# Patient Record
Sex: Female | Born: 1940
Health system: Southern US, Community
[De-identification: ages and names within clinical notes are randomized; demographics above are authoritative.]

## PROBLEM LIST (undated history)

## (undated) DIAGNOSIS — M109 Gout, unspecified: Secondary | ICD-10-CM

## (undated) DIAGNOSIS — J310 Chronic rhinitis: Secondary | ICD-10-CM

## (undated) DIAGNOSIS — F410 Panic disorder [episodic paroxysmal anxiety] without agoraphobia: Secondary | ICD-10-CM

## (undated) DIAGNOSIS — I495 Sick sinus syndrome: Secondary | ICD-10-CM

## (undated) DIAGNOSIS — K219 Gastro-esophageal reflux disease without esophagitis: Secondary | ICD-10-CM

## (undated) DIAGNOSIS — J40 Bronchitis, not specified as acute or chronic: Secondary | ICD-10-CM

## (undated) DIAGNOSIS — E039 Hypothyroidism, unspecified: Secondary | ICD-10-CM

## (undated) DIAGNOSIS — F40240 Claustrophobia: Secondary | ICD-10-CM

## (undated) DIAGNOSIS — I480 Paroxysmal atrial fibrillation: Secondary | ICD-10-CM

## (undated) DIAGNOSIS — E669 Obesity, unspecified: Secondary | ICD-10-CM

## (undated) DIAGNOSIS — F4310 Post-traumatic stress disorder, unspecified: Secondary | ICD-10-CM

## (undated) DIAGNOSIS — E785 Hyperlipidemia, unspecified: Secondary | ICD-10-CM

## (undated) DIAGNOSIS — I10 Essential (primary) hypertension: Secondary | ICD-10-CM

## (undated) DIAGNOSIS — F419 Anxiety disorder, unspecified: Secondary | ICD-10-CM

## (undated) DIAGNOSIS — I351 Nonrheumatic aortic (valve) insufficiency: Secondary | ICD-10-CM

## (undated) DIAGNOSIS — M7070 Other bursitis of hip, unspecified hip: Secondary | ICD-10-CM

## (undated) DIAGNOSIS — J45909 Unspecified asthma, uncomplicated: Secondary | ICD-10-CM

## (undated) DIAGNOSIS — M1990 Unspecified osteoarthritis, unspecified site: Secondary | ICD-10-CM

## (undated) DIAGNOSIS — I340 Nonrheumatic mitral (valve) insufficiency: Secondary | ICD-10-CM

## (undated) DIAGNOSIS — G4730 Sleep apnea, unspecified: Secondary | ICD-10-CM

## (undated) DIAGNOSIS — I499 Cardiac arrhythmia, unspecified: Secondary | ICD-10-CM

## (undated) HISTORY — DX: Bronchitis, not specified as acute or chronic: J40

## (undated) HISTORY — DX: Unspecified asthma, uncomplicated: J45.909

## (undated) HISTORY — PX: OTHER SURGICAL HISTORY: SHX169

## (undated) HISTORY — DX: Obesity, unspecified: E66.9

## (undated) HISTORY — PX: TONSILLECTOMY: SUR1361

## (undated) HISTORY — DX: Essential (primary) hypertension: I10

## (undated) HISTORY — PX: PACEMAKER INSERTION: SHX728

## (undated) HISTORY — DX: Chronic rhinitis: J31.0

## (undated) HISTORY — PX: ROTATOR CUFF REPAIR: SHX139

## (undated) HISTORY — DX: Unspecified osteoarthritis, unspecified site: M19.90

## (undated) HISTORY — DX: Nonrheumatic mitral (valve) insufficiency: I34.0

## (undated) HISTORY — DX: Paroxysmal atrial fibrillation: I48.0

## (undated) HISTORY — DX: Hypothyroidism, unspecified: E03.9

## (undated) HISTORY — DX: Hyperlipidemia, unspecified: E78.5

## (undated) HISTORY — DX: Sick sinus syndrome: I49.5

## (undated) HISTORY — PX: CARDIAC CATHETERIZATION: SHX172

## (undated) HISTORY — DX: Nonrheumatic aortic (valve) insufficiency: I35.1

## (undated) HISTORY — PX: INSERT / REPLACE / REMOVE PACEMAKER: SUR710

---

## 2001-10-29 ENCOUNTER — Encounter: Payer: Self-pay | Admitting: Internal Medicine

## 2001-10-29 ENCOUNTER — Encounter: Admission: RE | Admit: 2001-10-29 | Discharge: 2001-10-29 | Payer: Self-pay | Admitting: Internal Medicine

## 2001-11-01 ENCOUNTER — Encounter: Admission: RE | Admit: 2001-11-01 | Discharge: 2001-11-01 | Payer: Self-pay | Admitting: Internal Medicine

## 2001-11-01 ENCOUNTER — Encounter: Payer: Self-pay | Admitting: Internal Medicine

## 2002-06-10 ENCOUNTER — Encounter: Admission: RE | Admit: 2002-06-10 | Discharge: 2002-06-10 | Payer: Self-pay | Admitting: Internal Medicine

## 2002-06-10 ENCOUNTER — Encounter: Payer: Self-pay | Admitting: Internal Medicine

## 2002-08-19 ENCOUNTER — Encounter: Payer: Self-pay | Admitting: *Deleted

## 2002-08-19 ENCOUNTER — Inpatient Hospital Stay (HOSPITAL_COMMUNITY): Admission: AD | Admit: 2002-08-19 | Discharge: 2002-08-23 | Payer: Self-pay | Admitting: *Deleted

## 2002-08-20 ENCOUNTER — Encounter: Payer: Self-pay | Admitting: Cardiovascular Disease

## 2002-12-12 ENCOUNTER — Encounter: Admission: RE | Admit: 2002-12-12 | Discharge: 2002-12-12 | Payer: Self-pay | Admitting: Internal Medicine

## 2002-12-12 ENCOUNTER — Encounter: Payer: Self-pay | Admitting: Internal Medicine

## 2003-04-01 ENCOUNTER — Encounter: Admission: RE | Admit: 2003-04-01 | Discharge: 2003-04-01 | Payer: Self-pay | Admitting: Internal Medicine

## 2003-12-17 ENCOUNTER — Encounter: Admission: RE | Admit: 2003-12-17 | Discharge: 2003-12-17 | Payer: Self-pay

## 2004-03-29 ENCOUNTER — Ambulatory Visit (HOSPITAL_COMMUNITY): Admission: RE | Admit: 2004-03-29 | Discharge: 2004-03-29 | Payer: Self-pay | Admitting: Orthopedic Surgery

## 2004-03-30 ENCOUNTER — Ambulatory Visit: Payer: Self-pay

## 2004-04-29 ENCOUNTER — Observation Stay (HOSPITAL_COMMUNITY): Admission: RE | Admit: 2004-04-29 | Discharge: 2004-04-30 | Payer: Self-pay | Admitting: Orthopedic Surgery

## 2004-05-31 ENCOUNTER — Ambulatory Visit: Payer: Self-pay | Admitting: *Deleted

## 2004-06-08 ENCOUNTER — Ambulatory Visit: Payer: Self-pay

## 2004-06-28 ENCOUNTER — Ambulatory Visit: Payer: Self-pay | Admitting: *Deleted

## 2005-09-20 ENCOUNTER — Ambulatory Visit (HOSPITAL_COMMUNITY): Admission: RE | Admit: 2005-09-20 | Discharge: 2005-09-20 | Payer: Self-pay

## 2005-12-19 ENCOUNTER — Encounter: Admission: RE | Admit: 2005-12-19 | Discharge: 2005-12-19 | Payer: Self-pay | Admitting: Internal Medicine

## 2007-04-03 ENCOUNTER — Ambulatory Visit: Payer: Self-pay | Admitting: Cardiology

## 2007-04-04 ENCOUNTER — Encounter: Payer: Self-pay | Admitting: Cardiology

## 2007-04-04 ENCOUNTER — Ambulatory Visit: Payer: Self-pay

## 2007-04-05 ENCOUNTER — Ambulatory Visit: Payer: Self-pay | Admitting: Cardiology

## 2007-04-08 ENCOUNTER — Ambulatory Visit: Payer: Self-pay | Admitting: Cardiology

## 2007-04-08 LAB — CONVERTED CEMR LAB
ALT: 21 units/L (ref 0–35)
Alkaline Phosphatase: 44 units/L (ref 39–117)
BUN: 32 mg/dL — ABNORMAL HIGH (ref 6–23)
Basophils Relative: 0.8 % (ref 0.0–1.0)
CO2: 25 meq/L (ref 19–32)
Calcium: 9 mg/dL (ref 8.4–10.5)
Creatinine, Ser: 1.1 mg/dL (ref 0.4–1.2)
HDL: 54.2 mg/dL (ref >39.0)
Hemoglobin: 13.7 g/dL (ref 12.0–15.0)
LDL Cholesterol: 71 mg/dL (ref 0–99)
Monocytes Relative: 8.3 % (ref 3.0–11.0)
Platelets: 173 10*3/uL (ref 150–400)
RBC: 4.35 M/uL (ref 3.87–5.11)
RDW: 13.4 % (ref 11.5–14.6)
Total Bilirubin: 0.7 mg/dL (ref 0.3–1.2)
Total Protein: 6.9 g/dL (ref 6.0–8.3)
Triglycerides: 74 mg/dL (ref 0–149)
VLDL: 15 mg/dL (ref 0–40)

## 2007-04-16 ENCOUNTER — Ambulatory Visit: Payer: Self-pay | Admitting: Cardiovascular Disease

## 2007-04-16 ENCOUNTER — Ambulatory Visit: Payer: Self-pay

## 2007-04-24 ENCOUNTER — Ambulatory Visit: Payer: Self-pay | Admitting: Internal Medicine

## 2007-05-01 ENCOUNTER — Ambulatory Visit: Payer: Self-pay | Admitting: Internal Medicine

## 2007-05-08 ENCOUNTER — Ambulatory Visit: Payer: Self-pay | Admitting: Cardiology

## 2007-05-13 ENCOUNTER — Ambulatory Visit: Payer: Self-pay | Admitting: Internal Medicine

## 2007-05-13 LAB — CONVERTED CEMR LAB
BUN: 37 mg/dL — ABNORMAL HIGH (ref 6–23)
Basophils Relative: 0.9 % (ref 0.0–1.0)
CO2: 25 meq/L (ref 19–32)
GFR calc Af Amer: 71 mL/min
HCT: 44.3 % (ref 36.0–46.0)
Hemoglobin: 14.5 g/dL (ref 12.0–15.0)
Lymphocytes Relative: 35.9 % (ref 12.0–46.0)
MCHC: 32.8 g/dL (ref 30.0–36.0)
Monocytes Absolute: 0.4 10*3/uL (ref 0.2–0.7)
Monocytes Relative: 7.2 % (ref 3.0–11.0)
Neutro Abs: 3 10*3/uL (ref 1.4–7.7)
Neutrophils Relative %: 53.5 % (ref 43.0–77.0)
Potassium: 3.5 meq/L (ref 3.5–5.1)
Prothrombin Time: 23.5 s — ABNORMAL HIGH (ref 10.9–13.3)
Sodium: 140 meq/L (ref 135–145)

## 2007-05-14 ENCOUNTER — Ambulatory Visit (HOSPITAL_COMMUNITY): Admission: AD | Admit: 2007-05-14 | Discharge: 2007-05-15 | Payer: Self-pay | Admitting: Internal Medicine

## 2007-05-14 ENCOUNTER — Ambulatory Visit: Payer: Self-pay | Admitting: Internal Medicine

## 2007-05-22 ENCOUNTER — Ambulatory Visit: Payer: Self-pay | Admitting: Cardiology

## 2007-05-27 ENCOUNTER — Ambulatory Visit: Payer: Self-pay

## 2007-05-31 ENCOUNTER — Ambulatory Visit: Payer: Self-pay | Admitting: Cardiology

## 2007-06-05 ENCOUNTER — Ambulatory Visit: Payer: Self-pay | Admitting: Internal Medicine

## 2007-06-26 ENCOUNTER — Ambulatory Visit: Payer: Self-pay | Admitting: Cardiovascular Disease

## 2007-07-24 ENCOUNTER — Ambulatory Visit: Payer: Self-pay | Admitting: Cardiology

## 2007-08-07 ENCOUNTER — Ambulatory Visit: Payer: Self-pay | Admitting: Cardiology

## 2007-08-21 ENCOUNTER — Ambulatory Visit: Payer: Self-pay | Admitting: Cardiovascular Disease

## 2007-09-09 ENCOUNTER — Ambulatory Visit: Payer: Self-pay | Admitting: Internal Medicine

## 2007-09-18 ENCOUNTER — Ambulatory Visit: Payer: Self-pay | Admitting: Internal Medicine

## 2007-09-25 ENCOUNTER — Ambulatory Visit: Payer: Self-pay | Admitting: Cardiology

## 2007-10-09 ENCOUNTER — Ambulatory Visit: Payer: Self-pay | Admitting: Cardiovascular Disease

## 2007-10-09 ENCOUNTER — Ambulatory Visit: Payer: Self-pay | Admitting: Cardiology

## 2007-10-09 LAB — CONVERTED CEMR LAB
ALT: 19 units/L (ref 0–35)
AST: 27 units/L (ref 0–37)
BUN: 30 mg/dL — ABNORMAL HIGH (ref 6–23)
Basophils Relative: 0.5 % (ref 0.0–3.0)
CO2: 26 meq/L (ref 19–32)
Calcium: 9.5 mg/dL (ref 8.4–10.5)
Chloride: 104 meq/L (ref 96–112)
Cholesterol: 165 mg/dL (ref 0–200)
Creatinine, Ser: 1.1 mg/dL (ref 0.4–1.2)
Eosinophils Relative: 3.6 % (ref 0.0–5.0)
Glucose, Bld: 93 mg/dL (ref 70–99)
Hemoglobin: 13.9 g/dL (ref 12.0–15.0)
LDL Cholesterol: 83 mg/dL (ref 0–99)
Lymphocytes Relative: 30 % (ref 12.0–46.0)
MCHC: 33.6 g/dL (ref 30.0–36.0)
Monocytes Relative: 7.6 % (ref 3.0–12.0)
Neutro Abs: 3.5 10*3/uL (ref 1.4–7.7)
Neutrophils Relative %: 58.3 % (ref 43.0–77.0)
RBC: 4.4 M/uL (ref 3.87–5.11)
Total Bilirubin: 0.8 mg/dL (ref 0.3–1.2)
Total CHOL/HDL Ratio: 3.6
Total Protein: 7.1 g/dL (ref 6.0–8.3)
VLDL: 36 mg/dL (ref 0–40)
WBC: 5.8 10*3/uL (ref 4.5–10.5)

## 2007-10-30 ENCOUNTER — Ambulatory Visit: Payer: Self-pay | Admitting: Cardiology

## 2007-11-13 ENCOUNTER — Ambulatory Visit: Payer: Self-pay | Admitting: Cardiovascular Disease

## 2007-11-27 ENCOUNTER — Ambulatory Visit: Payer: Self-pay | Admitting: Cardiovascular Disease

## 2007-12-25 ENCOUNTER — Ambulatory Visit: Payer: Self-pay | Admitting: Cardiovascular Disease

## 2008-01-29 ENCOUNTER — Ambulatory Visit: Payer: Self-pay | Admitting: Internal Medicine

## 2008-02-26 ENCOUNTER — Ambulatory Visit: Payer: Self-pay | Admitting: Internal Medicine

## 2008-03-18 ENCOUNTER — Ambulatory Visit: Payer: Self-pay | Admitting: Internal Medicine

## 2008-04-02 ENCOUNTER — Encounter: Admission: RE | Admit: 2008-04-02 | Discharge: 2008-04-02 | Payer: Self-pay | Admitting: Nurse Practitioner

## 2008-04-15 ENCOUNTER — Ambulatory Visit: Payer: Self-pay | Admitting: Cardiology

## 2008-04-30 ENCOUNTER — Encounter: Payer: Self-pay | Admitting: Internal Medicine

## 2008-05-06 ENCOUNTER — Ambulatory Visit: Payer: Self-pay | Admitting: Cardiovascular Disease

## 2008-05-06 ENCOUNTER — Ambulatory Visit: Payer: Self-pay | Admitting: Internal Medicine

## 2008-06-03 ENCOUNTER — Ambulatory Visit: Payer: Self-pay | Admitting: Internal Medicine

## 2008-06-04 DIAGNOSIS — E669 Obesity, unspecified: Secondary | ICD-10-CM

## 2008-06-04 DIAGNOSIS — E039 Hypothyroidism, unspecified: Secondary | ICD-10-CM | POA: Insufficient documentation

## 2008-06-04 DIAGNOSIS — I10 Essential (primary) hypertension: Secondary | ICD-10-CM | POA: Insufficient documentation

## 2008-06-04 DIAGNOSIS — I495 Sick sinus syndrome: Secondary | ICD-10-CM | POA: Insufficient documentation

## 2008-06-04 DIAGNOSIS — I4891 Unspecified atrial fibrillation: Secondary | ICD-10-CM | POA: Insufficient documentation

## 2008-06-04 DIAGNOSIS — E785 Hyperlipidemia, unspecified: Secondary | ICD-10-CM | POA: Insufficient documentation

## 2008-06-05 ENCOUNTER — Encounter (INDEPENDENT_AMBULATORY_CARE_PROVIDER_SITE_OTHER): Payer: Self-pay | Admitting: *Deleted

## 2008-06-05 ENCOUNTER — Encounter: Payer: Self-pay | Admitting: Cardiology

## 2008-06-05 ENCOUNTER — Ambulatory Visit: Payer: Self-pay | Admitting: Cardiology

## 2008-06-05 DIAGNOSIS — Z950 Presence of cardiac pacemaker: Secondary | ICD-10-CM

## 2008-06-22 ENCOUNTER — Ambulatory Visit: Payer: Self-pay | Admitting: Internal Medicine

## 2008-06-24 ENCOUNTER — Ambulatory Visit: Payer: Self-pay | Admitting: Cardiology

## 2008-07-02 ENCOUNTER — Ambulatory Visit: Payer: Self-pay | Admitting: Cardiology

## 2008-07-16 ENCOUNTER — Ambulatory Visit: Payer: Self-pay | Admitting: Internal Medicine

## 2008-07-28 ENCOUNTER — Encounter: Payer: Self-pay | Admitting: *Deleted

## 2008-08-13 ENCOUNTER — Ambulatory Visit: Payer: Self-pay | Admitting: Internal Medicine

## 2008-08-13 ENCOUNTER — Encounter (INDEPENDENT_AMBULATORY_CARE_PROVIDER_SITE_OTHER): Payer: Self-pay | Admitting: Pharmacist

## 2008-08-13 LAB — CONVERTED CEMR LAB
POC INR: 2.8
Protime: 20.4

## 2008-08-26 ENCOUNTER — Telehealth: Payer: Self-pay | Admitting: Cardiology

## 2008-09-02 ENCOUNTER — Encounter: Payer: Self-pay | Admitting: *Deleted

## 2008-09-16 ENCOUNTER — Ambulatory Visit: Payer: Self-pay | Admitting: Cardiology

## 2008-09-21 ENCOUNTER — Ambulatory Visit: Payer: Self-pay | Admitting: Internal Medicine

## 2008-09-24 ENCOUNTER — Ambulatory Visit (HOSPITAL_BASED_OUTPATIENT_CLINIC_OR_DEPARTMENT_OTHER): Admission: RE | Admit: 2008-09-24 | Discharge: 2008-09-24 | Payer: Self-pay | Admitting: Nurse Practitioner

## 2008-09-26 ENCOUNTER — Ambulatory Visit: Payer: Self-pay | Admitting: Internal Medicine

## 2008-09-30 ENCOUNTER — Ambulatory Visit: Payer: Self-pay | Admitting: Cardiology

## 2008-09-30 LAB — CONVERTED CEMR LAB
POC INR: 4
Prothrombin Time: 24.2 s

## 2008-10-14 ENCOUNTER — Ambulatory Visit: Payer: Self-pay | Admitting: Internal Medicine

## 2008-11-04 ENCOUNTER — Ambulatory Visit: Payer: Self-pay | Admitting: Cardiology

## 2008-11-04 LAB — CONVERTED CEMR LAB: POC INR: 2.8

## 2008-11-12 ENCOUNTER — Encounter (INDEPENDENT_AMBULATORY_CARE_PROVIDER_SITE_OTHER): Payer: Self-pay | Admitting: *Deleted

## 2008-11-25 ENCOUNTER — Ambulatory Visit: Payer: Self-pay | Admitting: Cardiovascular Disease

## 2008-11-25 ENCOUNTER — Ambulatory Visit: Payer: Self-pay

## 2008-11-25 ENCOUNTER — Encounter: Payer: Self-pay | Admitting: Internal Medicine

## 2008-11-25 LAB — CONVERTED CEMR LAB: POC INR: 2.1

## 2008-12-21 ENCOUNTER — Ambulatory Visit: Payer: Self-pay | Admitting: Internal Medicine

## 2009-01-04 ENCOUNTER — Ambulatory Visit: Payer: Self-pay | Admitting: Cardiology

## 2009-01-04 LAB — CONVERTED CEMR LAB: POC INR: 2.4

## 2009-02-01 ENCOUNTER — Ambulatory Visit: Payer: Self-pay | Admitting: Cardiology

## 2009-03-04 ENCOUNTER — Ambulatory Visit: Payer: Self-pay | Admitting: Cardiovascular Disease

## 2009-03-04 LAB — CONVERTED CEMR LAB: POC INR: 2.9

## 2009-03-22 ENCOUNTER — Ambulatory Visit: Payer: Self-pay | Admitting: Internal Medicine

## 2009-04-02 ENCOUNTER — Ambulatory Visit: Payer: Self-pay | Admitting: Internal Medicine

## 2009-04-02 LAB — CONVERTED CEMR LAB: POC INR: 1.9

## 2009-04-30 ENCOUNTER — Ambulatory Visit: Payer: Self-pay | Admitting: Cardiology

## 2009-05-17 ENCOUNTER — Encounter: Admission: RE | Admit: 2009-05-17 | Discharge: 2009-05-17 | Payer: Self-pay | Admitting: Nurse Practitioner

## 2009-05-22 ENCOUNTER — Emergency Department (HOSPITAL_COMMUNITY): Admission: EM | Admit: 2009-05-22 | Discharge: 2009-05-22 | Payer: Self-pay | Admitting: Emergency Medicine

## 2009-05-22 ENCOUNTER — Ambulatory Visit: Payer: Self-pay | Admitting: Cardiology

## 2009-05-22 ENCOUNTER — Encounter: Payer: Self-pay | Admitting: Internal Medicine

## 2009-05-25 ENCOUNTER — Encounter: Payer: Self-pay | Admitting: Cardiology

## 2009-05-26 ENCOUNTER — Ambulatory Visit: Payer: Self-pay | Admitting: Cardiovascular Disease

## 2009-05-31 ENCOUNTER — Ambulatory Visit: Payer: Self-pay | Admitting: Cardiovascular Disease

## 2009-05-31 LAB — CONVERTED CEMR LAB: POC INR: 1.6

## 2009-06-07 ENCOUNTER — Ambulatory Visit: Payer: Self-pay | Admitting: Cardiovascular Disease

## 2009-06-07 LAB — CONVERTED CEMR LAB: POC INR: 2.9

## 2009-06-14 ENCOUNTER — Ambulatory Visit: Payer: Self-pay | Admitting: Cardiology

## 2009-06-21 ENCOUNTER — Ambulatory Visit: Payer: Self-pay | Admitting: Internal Medicine

## 2009-06-25 ENCOUNTER — Ambulatory Visit: Payer: Self-pay | Admitting: Cardiology

## 2009-06-25 LAB — CONVERTED CEMR LAB: POC INR: 1.7

## 2009-07-12 ENCOUNTER — Ambulatory Visit: Payer: Self-pay | Admitting: Cardiovascular Disease

## 2009-07-12 LAB — CONVERTED CEMR LAB: POC INR: 3.1

## 2009-07-30 ENCOUNTER — Ambulatory Visit: Payer: Self-pay | Admitting: Cardiovascular Disease

## 2009-07-30 LAB — CONVERTED CEMR LAB: POC INR: 2

## 2009-08-23 ENCOUNTER — Ambulatory Visit: Payer: Self-pay | Admitting: Cardiology

## 2009-09-17 ENCOUNTER — Ambulatory Visit: Payer: Self-pay | Admitting: Cardiology

## 2009-09-20 ENCOUNTER — Ambulatory Visit: Payer: Self-pay | Admitting: Internal Medicine

## 2009-10-08 ENCOUNTER — Ambulatory Visit: Payer: Self-pay | Admitting: Internal Medicine

## 2009-10-29 ENCOUNTER — Ambulatory Visit: Payer: Self-pay | Admitting: Cardiology

## 2009-11-12 ENCOUNTER — Ambulatory Visit: Payer: Self-pay | Admitting: Cardiology

## 2009-11-26 ENCOUNTER — Encounter (INDEPENDENT_AMBULATORY_CARE_PROVIDER_SITE_OTHER): Payer: Self-pay | Admitting: *Deleted

## 2009-12-03 ENCOUNTER — Ambulatory Visit: Payer: Self-pay | Admitting: Cardiology

## 2009-12-03 LAB — CONVERTED CEMR LAB: POC INR: 2.6

## 2009-12-17 ENCOUNTER — Encounter: Admission: RE | Admit: 2009-12-17 | Discharge: 2009-12-17 | Payer: Self-pay | Admitting: Internal Medicine

## 2009-12-20 ENCOUNTER — Ambulatory Visit: Payer: Self-pay | Admitting: Internal Medicine

## 2009-12-27 ENCOUNTER — Encounter: Payer: Self-pay | Admitting: Internal Medicine

## 2009-12-27 ENCOUNTER — Ambulatory Visit: Payer: Self-pay

## 2009-12-31 ENCOUNTER — Ambulatory Visit: Payer: Self-pay | Admitting: Cardiology

## 2010-01-28 ENCOUNTER — Ambulatory Visit: Payer: Self-pay | Admitting: Cardiovascular Disease

## 2010-02-25 ENCOUNTER — Ambulatory Visit: Payer: Self-pay | Admitting: Cardiovascular Disease

## 2010-02-25 LAB — CONVERTED CEMR LAB: POC INR: 2.8

## 2010-03-20 ENCOUNTER — Encounter: Payer: Self-pay | Admitting: Internal Medicine

## 2010-03-21 ENCOUNTER — Encounter: Payer: Self-pay | Admitting: Internal Medicine

## 2010-03-25 ENCOUNTER — Ambulatory Visit: Admission: RE | Admit: 2010-03-25 | Discharge: 2010-03-25 | Payer: Self-pay | Source: Home / Self Care

## 2010-03-25 LAB — CONVERTED CEMR LAB: POC INR: 1.6

## 2010-03-29 ENCOUNTER — Ambulatory Visit: Payer: Self-pay | Admitting: Internal Medicine

## 2010-03-29 NOTE — Cardiovascular Report (Signed)
Summary: TTM   TTM   Imported By: Roderic Ovens 07/22/2009 11:59:40  _____________________________________________________________________  External Attachment:    Type:   Image     Comment:   External Document

## 2010-03-29 NOTE — Medication Information (Signed)
Summary: rov/tm  Anticoagulant Therapy  Managed by: Leota Sauers, PharmD, BCPS, CPP Referring MD: Olga Millers MD Supervising MD: Excell Seltzer MD, Casimiro Needle Indication 1: Atrial Fibrillation (ICD-427.31) Lab Used: [facility]  Site: [facility] INR POC 2.9 INR RANGE 2 - 3  Dietary changes: no    Health status changes: no    Bleeding/hemorrhagic complications: no    Recent/future hospitalizations: no    Any changes in medication regimen? no    Recent/future dental: no  Any missed doses?: no       Is patient compliant with meds? yes       Current Medications (verified): 1)  Diovan 320 Mg Tabs (Valsartan) .Marland Kitchen.. 1 Tab By Mouth Once Daily 2)  Zocor 20 Mg Tabs (Simvastatin) .Marland Kitchen.. 1 Tab By Mouth Once Daily 3)  Hydrochlorothiazide 12.5 Mg Tabs (Hydrochlorothiazide) .Marland Kitchen.. 1 Tab By Mouth Once Daily 4)  Fish Oil   Oil (Fish Oil) .Marland Kitchen.. 1 Tab By Mouth Once Daily 5)  Warfarin Sodium 5 Mg Tabs (Warfarin Sodium) .... Use As Directed By Anticoagulation Clinic 6)  Potassium Chloride Cr 10 Meq Cr-Caps (Potassium Chloride) .... Take One Tablet By Mouth Daily 7)  Metoprolol Tartrate 50 Mg Tabs (Metoprolol Tartrate) .... Take One and One Half Tablet By Mouth Twice A Day 8)  Prometrium 100 Mg Caps (Progesterone Micronized) .... Once Daily 9)  Vitamin D3 25000 Units .Marland Kitchen.. 1 Tab Bi Weekly  Allergies (verified): No Known Drug Allergies  Anticoagulation Management History:      The patient is taking warfarin and comes in today for a routine follow up visit.  Positive risk factors for bleeding include an age of 69 years or older.  The bleeding index is 'intermediate risk'.  Positive CHADS2 values include History of HTN.  Negative CHADS2 values include Age > 72 years old.  The start date was 04/01/2007.  Her last INR was 3.4 RATIO.  Anticoagulation responsible provider: Excell Seltzer MD, Casimiro Needle.  INR POC: 2.9.  Cuvette Lot#: [id number].  Exp: 03/2010.    Anticoagulation Management Assessment/Plan:      The patient's  current anticoagulation dose is Warfarin sodium 5 mg tabs: Use as directed by Anticoagulation Clinic.  The target INR is 2 - 3.  The next INR is due 04/01/2009.  Anticoagulation instructions were given to patient.  Results were reviewed/authorized by Leota Sauers, PharmD, BCPS, CPP.         Prior Anticoagulation Instructions: INR 3.1 Skip tomorrow's dose then resume 1/2 pill everyday excpet 1 pill on Mondays and Fridays. Recheck in 4 weeks.   Current Anticoagulation Instructions: INR 2.9  Continue Coumadin 1/2 tab = 2.5mg  each day except  1 tab = 5mg  on Mon and Fri

## 2010-03-29 NOTE — Cardiovascular Report (Signed)
Summary: Office Visit   Office Visit   Imported By: Roderic Ovens 01/12/2010 16:44:27  _____________________________________________________________________  External Attachment:    Type:   Image     Comment:   External Document

## 2010-03-29 NOTE — Medication Information (Signed)
Summary: rov/ln  Anticoagulant Therapy  Managed by: Cloyde Reams, RN, BSN Referring MD: Olga Millers MD PCP: [facility]; Elie Goody Supervising MD: Tenny Craw MD, Gunnar Fusi Indication 1: Atrial Fibrillation (ICD-427.31) Lab Used: [facility] Site: [facility] INR POC 1.8 INR RANGE 2 - 3  Dietary changes: no    Health status changes: no    Bleeding/hemorrhagic complications: no    Recent/future hospitalizations: no    Any changes in medication regimen? yes       Details: see med list  Recent/future dental: no  Any missed doses?: no       Is patient compliant with meds? yes       Allergies: No Known Drug Allergies  Anticoagulation Management History:      The patient is taking warfarin and comes in today for a routine follow up visit.  Positive risk factors for bleeding include an age of 70 years or older.  The bleeding index is 'intermediate risk'.  Positive CHADS2 values include History of HTN.  Negative CHADS2 values include Age > 71 years old.  The start date was 04/01/2007.  Her last INR was 3.4 RATIO.  Anticoagulation responsible provider: Tenny Craw MD, Gunnar Fusi.  INR POC: 1.8.  Exp: 11/2010.    Anticoagulation Management Assessment/Plan:      The patient's current anticoagulation dose is Warfarin sodium 5 mg tabs: Use as directed by Anticoagulation Clinic.  The target INR is 2 - 3.  The next INR is due 10/29/2009.  Anticoagulation instructions were given to patient.  Results were reviewed/authorized by Cloyde Reams, RN, BSN.  She was notified by Cloyde Reams RN.         Prior Anticoagulation Instructions: INR 3.1  Decrease to 1/2 tab daily except for 1 tab on Friday.  Re-check in 3 weeks.  Current Anticoagulation Instructions: INR 1.8  Take 1.5 tablets today, then start taking 1/2 tablet daily except 1 tablet on Tuesdays and Fridays.  Recheck in 3 weeks.

## 2010-03-29 NOTE — Letter (Signed)
Summary: Device-Delinquent Check  [facility], Main Office  [address]   [place], [place] [address]   Phone: [phone]  Fax: [phone]     November 26, 2009 MRN: [id number]   [facility] [address] [place], [place]  [address]   Dear Ms. Meidinger,  According to our records, you have not had your implanted device checked in the recommended period of time.  We are unable to determine appropriate device function without checking your device on a regular basis.  Please call our office to schedule an appointment with Dr Ladona Ridgel,  as soon as possible.  If you are having your device checked by another physician, please call us so that we may update our records.  Thank you,  Letta Moynahan, EMT  November 26, 2009 10:56 AM  [facility]

## 2010-03-29 NOTE — Cardiovascular Report (Signed)
Summary: TTM   TTM   Imported By: Roderic Ovens 04/26/2009 13:30:58  _____________________________________________________________________  External Attachment:    Type:   Image     Comment:   External Document

## 2010-03-29 NOTE — Medication Information (Signed)
Summary: [name]      Allergies Added: NKDA Anticoagulant Therapy  Managed by: Lynann Bologna, PharmD Referring MD: Olga Millers MD PCP: [facility]; Elie Goody Supervising MD: Clifton James MD, Cristal Deer Indication 1: Atrial Fibrillation (ICD-427.31) Lab Used: [facility] Site: [facility] INR POC 3.1 INR RANGE 2 - 3  Dietary changes: no    Health status changes: no    Bleeding/hemorrhagic complications: no    Recent/future hospitalizations: no    Any changes in medication regimen? no    Recent/future dental: no  Any missed doses?: no       Is patient compliant with meds? yes       Current Medications (verified): 1)  Diovan 320 Mg Tabs (Valsartan) .Marland Kitchen.. 1 Tab By Mouth Once Daily 2)  Zocor 20 Mg Tabs (Simvastatin) .Marland Kitchen.. 1 Tab By Mouth Once Daily 3)  Hydrochlorothiazide 12.5 Mg Tabs (Hydrochlorothiazide) .Marland Kitchen.. 1 Tab By Mouth Once Daily 4)  Fish Oil   Oil (Fish Oil) .Marland Kitchen.. 1 Tab By Mouth Once Daily 5)  Warfarin Sodium 5 Mg Tabs (Warfarin Sodium) .... Use As Directed By Anticoagulation Clinic 6)  Potassium Chloride Cr 10 Meq Cr-Caps (Potassium Chloride) .... Take One Tablet By Mouth Daily 7)  Metoprolol Tartrate 50 Mg Tabs (Metoprolol Tartrate) .... Take One and One Half Tablet By Mouth Twice A Day 8)  Prometrium 100 Mg Caps (Progesterone Micronized) .... Once Daily 9)  Vitamin D3 25000 Units .Marland Kitchen.. 1 Tab Bi Weekly 10)  Valtrex 1000 Mg Tabs (Valacyclovir Hcl) .Marland Kitchen.. 1 Tablet Daily  Allergies (verified): No Known Drug Allergies  Anticoagulation Management History:      The patient is taking warfarin and comes in today for a routine follow up visit.  Positive risk factors for bleeding include an age of 59 years or older.  The bleeding index is 'intermediate risk'.  Positive CHADS2 values include History of HTN.  Negative CHADS2 values include Age > 66 years old.  The start date was 04/01/2007.  Her last INR was 3.4 RATIO.  Anticoagulation responsible provider: Clifton James  MD, Cristal Deer.  INR POC: 3.1.  Cuvette Lot#: [id number].  Exp: 09/2010.    Anticoagulation Management Assessment/Plan:      The patient's current anticoagulation dose is Warfarin sodium 5 mg tabs: Use as directed by Anticoagulation Clinic.  The target INR is 2 - 3.  The next INR is due 07/26/2009.  Anticoagulation instructions were given to patient.  Results were reviewed/authorized by Lynann Bologna, PharmD.  She was notified by Lynann Bologna.         Prior Anticoagulation Instructions: INR 1.7  Increase dose to 1/2 tablet every day except 1 tablet on Friday   Current Anticoagulation Instructions: INR 3.1  Hold tomorrow's dose then resume same dose of 1/2 tablet daily (2.5 mg), except one tablet (5 mg) daily.   Next INR on Tuesday, May 31st.

## 2010-03-29 NOTE — Procedures (Signed)
Summary: rov/pc2      Allergies Added: NKDA  Current Medications (verified): 1)  Diovan 320 Mg Tabs (Valsartan) .Marland Kitchen.. 1 Tab By Mouth Once Daily 2)  Hydrochlorothiazide 12.5 Mg Tabs (Hydrochlorothiazide) .Marland Kitchen.. 1 Tab By Mouth Once Daily 3)  Fish Oil   Oil (Fish Oil) .Marland Kitchen.. 1 Tab By Mouth Once Daily 4)  Warfarin Sodium 5 Mg Tabs (Warfarin Sodium) .... Use As Directed By Anticoagulation Clinic 5)  Potassium Chloride Cr 10 Meq Cr-Caps (Potassium Chloride) .... Take One Tablet By Mouth Daily 6)  Metoprolol Tartrate 50 Mg Tabs (Metoprolol Tartrate) .... Take One and One Half Tablet By Mouth Twice A Day 7)  Prometrium 100 Mg Caps (Progesterone Micronized) .... Once Daily 8)  Valtrex 1000 Mg Tabs (Valacyclovir Hcl) .Marland Kitchen.. 1 Tablet Daily 9)  Levothroid 50 Mcg Tabs (Levothyroxine Sodium) .... Take 1 Tablet By Mouth Daily 10)  Welchol 625 Mg Tabs (Colesevelam Hcl) .... Take 1-2 Tablets Two Times A Day  Allergies (verified): No Known Drug Allergies   PPM Specifications Following MD:  Lewayne Bunting, MD     PPM Vendor:  [employer]     PPM Model Number:  [id number]     PPM Serial Number:  [id number] PPM DOI:  05/14/2007      Lead 1    Location: RA     DOI: 05/14/2007     Model #: 1788TC     Serial #: [id number]     Status: active Lead 2    Location: RV     DOI: 05/14/2007     Model #: 1788TC     Serial #: [id number]     Status: active  Magnet Response Rate:  BOL 98.6 ERI 86.3  Indications:  Sick sinus syndrome   PPM Follow Up Remote Check?  No Battery Voltage:  2.81 V     Battery Est. Longevity:  8.5 years     Pacer Dependent:  No       PPM Device Measurements Atrium  Amplitude: 1.9 mV, Impedance: 360 ohms, Threshold: 0.75 V at 0.4 msec Right Ventricle  Amplitude: 10.7 mV, Impedance: 616 ohms, Threshold: 0.625 V at 0.4 msec  Episodes MS Episodes:  419     Percent Mode Switch:  37%     Coumadin:  Yes Atrial Pacing:  99%     Ventricular Pacing:  <1%  Parameters Mode:  DDDR     Lower Rate Limit:  60     Upper  Rate Limit:  110 Paced AV Delay:  250     Sensed AV Delay:  250 Rate Response Parameters:  Threshold-Auto (-0.5), Slope-10, Reaction time-fast, Recovery time-medium Next Cardiology Appt Due:  06/28/2010 Tech Comments:  No parameter changes.  Device function normal.  419 mode switch episodes, + coumadin.  TTM's with [employer].  ROV 6 months with Dr. Ladona Ridgel. Altha Harm, LPN  December 27, 2009 3:02 PM

## 2010-03-29 NOTE — Medication Information (Signed)
Summary: Coumadin Clinic  Anticoagulant Therapy  Managed by: Weston Brass, PharmD Referring MD: Olga Millers MD PCP: [facility]; Tammy Worrell Supervising MD: Daleen Squibb MD, Maisie Fus Indication 1: Atrial Fibrillation (ICD-427.31) Lab Used: [facility] Site: [facility] INR POC 4.3 INR RANGE 2 - 3  Dietary changes: yes       Details: Less greens  Health status changes: no    Bleeding/hemorrhagic complications: no    Recent/future hospitalizations: no    Any changes in medication regimen? no    Recent/future dental: no  Any missed doses?: no       Is patient compliant with meds? yes       Allergies: No Known Drug Allergies  Anticoagulation Management History:      Positive risk factors for bleeding include an age of 34 years or older.  The bleeding index is 'intermediate risk'.  Positive CHADS2 values include History of HTN.  Negative CHADS2 values include Age > 81 years old.  The start date was 04/01/2007.  Her last INR was 3.4 RATIO.  Anticoagulation responsible provider: Daleen Squibb MD, Maisie Fus.  INR POC: 4.3.  Cuvette Lot#: [id number].  Exp: 11/2010.    Anticoagulation Management Assessment/Plan:      The patient's current anticoagulation dose is Warfarin sodium 5 mg tabs: Use as directed by Anticoagulation Clinic.  The target INR is 2 - 3.  The next INR is due 11/12/2009.  Anticoagulation instructions were given to patient.  Results were reviewed/authorized by Weston Brass, PharmD.  She was notified by Harrel Carina, PharmD candidate.         Prior Anticoagulation Instructions: INR 1.8  Take 1.5 tablets today, then start taking 1/2 tablet daily except 1 tablet on Tuesdays and Fridays.  Recheck in 3 weeks.    Current Anticoagulation Instructions: INR 4.3  Hold dose on Saturday and Sunday. Then resume taking 1/2 tablet every day except take 1 tablet on Fridays. Re-check INR in 2 weeks.

## 2010-03-29 NOTE — Medication Information (Signed)
Summary: rov/jm   Anticoagulant Therapy  Managed by: Weston Brass, PharmD Referring MD: Olga Millers MD PCP: [facility]; Elie Goody Supervising MD: Juanda Chance MD, Bruce Indication 1: Atrial Fibrillation (ICD-427.31) Lab Used: [facility] [place] Site: [facility] INR POC 2.6 INR RANGE 2 - 3  Dietary changes: no    Health status changes: no    Bleeding/hemorrhagic complications: no    Recent/future hospitalizations: no    Any changes in medication regimen? no    Recent/future dental: no  Any missed doses?: no       Is patient compliant with meds? yes       Allergies: No Known Drug Allergies  Anticoagulation Management History:      Positive risk factors for bleeding include an age of 71 years or older.  The bleeding index is 'intermediate risk'.  Positive CHADS2 values include History of HTN.  Negative CHADS2 values include Age > 71 years old.  The start date was 04/01/2007.  Her last INR was 3.4 RATIO.  Anticoagulation responsible provider: Juanda Chance MD, Smitty Cords.  INR POC: 2.6.  Exp: 12/2010.    Anticoagulation Management Assessment/Plan:      The patient's current anticoagulation dose is Warfarin sodium 5 mg tabs: Use as directed by Anticoagulation Clinic.  The target INR is 2 - 3.  The next INR is due 12/31/2009.  Anticoagulation instructions were given to patient.  Results were reviewed/authorized by Weston Brass, PharmD.  She was notified by Ilean Skill D candidate.         Prior Anticoagulation Instructions: INR 2.5  Continue one-half tablet every day except for one tablet on Friday.  Return to clinic in three weeks.  Current Anticoagulation Instructions: INR 2.6  Continue taking 1/2 tablet everyday except 1 tablet on Friday. Recheck in 4 weeks.

## 2010-03-29 NOTE — Medication Information (Signed)
Summary: rov/sak  Anticoagulant Therapy  Managed by: Weston Brass, PharmD Referring MD: Olga Millers MD PCP: [facility]; Tammy Worrell Supervising MD: Daleen Squibb MD, Maisie Fus Indication 1: Atrial Fibrillation (ICD-427.31) Lab Used: [facility] Site: [facility] INR POC 3.1 INR RANGE 2 - 3  Dietary changes: no    Health status changes: no    Bleeding/hemorrhagic complications: no    Recent/future hospitalizations: no    Any changes in medication regimen? yes       Details: started synthroid daily since last appt  Recent/future dental: no  Any missed doses?: no       Is patient compliant with meds? yes       Current Medications (verified): 1)  Diovan 320 Mg Tabs (Valsartan) .Marland Kitchen.. 1 Tab By Mouth Once Daily 2)  Hydrochlorothiazide 12.5 Mg Tabs (Hydrochlorothiazide) .Marland Kitchen.. 1 Tab By Mouth Once Daily 3)  Fish Oil   Oil (Fish Oil) .Marland Kitchen.. 1 Tab By Mouth Once Daily 4)  Warfarin Sodium 5 Mg Tabs (Warfarin Sodium) .... Use As Directed By Anticoagulation Clinic 5)  Potassium Chloride Cr 10 Meq Cr-Caps (Potassium Chloride) .... Take One Tablet By Mouth Daily 6)  Metoprolol Tartrate 50 Mg Tabs (Metoprolol Tartrate) .... Take One and One Half Tablet By Mouth Twice A Day 7)  Prometrium 100 Mg Caps (Progesterone Micronized) .... Once Daily 8)  Vitamin D3 25000 Units .Marland Kitchen.. 1 Tab Bi Weekly 9)  Valtrex 1000 Mg Tabs (Valacyclovir Hcl) .Marland Kitchen.. 1 Tablet Daily 10)  Questran 4 Gm Pack (Cholestyramine) .... Twice Daily, Separate From Other Medications By At [facility] Two Hours. 11)  Levothroid 50 Mcg Tabs (Levothyroxine Sodium) .... Take 1 Tablet By Mouth Daily  Allergies: No Known Drug Allergies  Anticoagulation Management History:      The patient is taking warfarin and comes in today for a routine follow up visit.  Positive risk factors for bleeding include an age of 71 years or older.  The bleeding index is 'intermediate risk'.  Positive CHADS2 values include History of HTN.  Negative CHADS2  values include Age > 73 years old.  The start date was 04/01/2007.  Her last INR was 3.4 RATIO.  Anticoagulation responsible provider: Daleen Squibb MD, Maisie Fus.  INR POC: 3.1.  Cuvette Lot#: [id number].  Exp: 11/2010.    Anticoagulation Management Assessment/Plan:      The patient's current anticoagulation dose is Warfarin sodium 5 mg tabs: Use as directed by Anticoagulation Clinic.  The target INR is 2 - 3.  The next INR is due 10/08/2009.  Anticoagulation instructions were given to patient.  Results were reviewed/authorized by Weston Brass, PharmD.  She was notified by Dillard Cannon.         Prior Anticoagulation Instructions: INR 2.0  Increase to 1 tablet on Tuesday's and Friday's, 1/2 pill all other days.   Recheck in 3-4 weeks.  Current Anticoagulation Instructions: INR 3.1  Decrease to 1/2 tab daily except for 1 tab on Friday.  Re-check in 3 weeks.

## 2010-03-29 NOTE — Medication Information (Signed)
Summary: [name]  Anticoagulant Therapy  Managed by: Cloyde Reams, RN, BSN Referring MD: Olga Millers MD PCP: [facility]; Elie Goody Supervising MD: Clifton James MD, Cristal Deer Indication 1: Atrial Fibrillation (ICD-427.31) Lab Used: [facility] Site: [facility] INR POC 2.9 INR RANGE 2 - 3  Dietary changes: no    Health status changes: no    Bleeding/hemorrhagic complications: no    Recent/future hospitalizations: no    Any changes in medication regimen? yes       Details: Last dose of Avelox will be 06/11/09.  Will start taking Valtrex on Sat 06/12/09.  Recent/future dental: no  Any missed doses?: no       Is patient compliant with meds? yes      Comments: Currently on coumadin 1/2 tablet daily.    Allergies (verified): No Known Drug Allergies  Anticoagulation Management History:      The patient is taking warfarin and comes in today for a routine follow up visit.  Positive risk factors for bleeding include an age of 70 years or older.  The bleeding index is 'intermediate risk'.  Positive CHADS2 values include History of HTN.  Negative CHADS2 values include Age > 23 years old.  The start date was 04/01/2007.  Her last INR was 3.4 RATIO.  Anticoagulation responsible provider: Clifton James MD, Cristal Deer.  INR POC: 2.9.  Cuvette Lot#: [id number].  Exp: 06/2010.    Anticoagulation Management Assessment/Plan:      The patient's current anticoagulation dose is Warfarin sodium 5 mg tabs: Use as directed by Anticoagulation Clinic.  The target INR is 2 - 3.  The next INR is due 06/14/2009.  Anticoagulation instructions were given to patient.  Results were reviewed/authorized by Cloyde Reams, RN, BSN.  She was notified by Cloyde Reams RN.         Prior Anticoagulation Instructions: INR 1.6 Today take extra 1/2 pill then resume 1/2 pill everyday. Recheck INR in one week.   Current Anticoagulation Instructions: INR 2.9  Take 1mg  tablet tomorrow, then resume same dosage  1/2 tablet daily.  Recheck in 1 week.

## 2010-03-29 NOTE — Medication Information (Signed)
Summary: [name]      Allergies Added: NKDA Anticoagulant Therapy  Managed by: Minerva Areola, RN Referring MD: Olga Millers MD PCP: [facility]; Elie Goody Supervising MD: Juanda Chance MD, Dwanda Tufano Indication 1: Atrial Fibrillation (ICD-427.31) Lab Used: [facility] Site: [facility] INR POC 2.3 INR RANGE 2 - 3  Dietary changes: no    Health status changes: no    Bleeding/hemorrhagic complications: no    Recent/future hospitalizations: no    Any changes in medication regimen? no    Recent/future dental: no  Any missed doses?: no       Is patient compliant with meds? yes       Current Medications (verified): 1)  Diovan 320 Mg Tabs (Valsartan) .Marland Kitchen.. 1 Tab By Mouth Once Daily 2)  Zocor 20 Mg Tabs (Simvastatin) .Marland Kitchen.. 1 Tab By Mouth Once Daily 3)  Hydrochlorothiazide 12.5 Mg Tabs (Hydrochlorothiazide) .Marland Kitchen.. 1 Tab By Mouth Once Daily 4)  Fish Oil   Oil (Fish Oil) .Marland Kitchen.. 1 Tab By Mouth Once Daily 5)  Warfarin Sodium 5 Mg Tabs (Warfarin Sodium) .... Use As Directed By Anticoagulation Clinic 6)  Potassium Chloride Cr 10 Meq Cr-Caps (Potassium Chloride) .... Take One Tablet By Mouth Daily 7)  Metoprolol Tartrate 50 Mg Tabs (Metoprolol Tartrate) .... Take One and One Half Tablet By Mouth Twice A Day 8)  Prometrium 100 Mg Caps (Progesterone Micronized) .... Once Daily 9)  Vitamin D3 25000 Units .Marland Kitchen.. 1 Tab Bi Weekly  Allergies (verified): No Known Drug Allergies  Anticoagulation Management History:      The patient is taking warfarin and comes in today for a routine follow up visit.  Positive risk factors for bleeding include an age of 62 years or older.  The bleeding index is 'intermediate risk'.  Positive CHADS2 values include History of HTN.  Negative CHADS2 values include Age > 62 years old.  The start date was 04/01/2007.  Her last INR was 3.4 RATIO.  Anticoagulation responsible provider: Juanda Chance MD, Smitty Cords.  INR POC: 2.3.  Cuvette Lot#: [id number].  Exp: 06/2010.     Anticoagulation Management Assessment/Plan:      The patient's current anticoagulation dose is Warfarin sodium 5 mg tabs: Use as directed by Anticoagulation Clinic.  The target INR is 2 - 3.  The next INR is due 05/28/2009.  Anticoagulation instructions were given to patient.  Results were reviewed/authorized by Minerva Areola, RN.  She was notified by Minerva Areola, RN, BSN.         Prior Anticoagulation Instructions: INR 1.9  Take 1 tablet today and tomorrow then resume same dosage 1/2 tablet daily except 1 tablet on Mondays and Fridays.  Recheck in 4 weeks.    Current Anticoagulation Instructions: The patient is to continue with the same dose of coumadin.  This dosage includes: half a tablet every day except a whole tablet on Mondays and Fridays.

## 2010-03-29 NOTE — Medication Information (Signed)
Summary: rov/sel  Anticoagulant Therapy  Managed by: Weston Brass, PharmD Referring MD: Olga Millers MD PCP: [facility]; Elie Goody Supervising MD: Juanda Chance MD, Bruce Indication 1: Atrial Fibrillation (ICD-427.31) Lab Used: [facility] Site: [facility] INR POC 2.1 INR RANGE 2 - 3  Dietary changes: no    Health status changes: no    Bleeding/hemorrhagic complications: no    Recent/future hospitalizations: no    Any changes in medication regimen? no    Recent/future dental: no  Any missed doses?: no       Is patient compliant with meds? yes       Allergies (verified): No Known Drug Allergies  Anticoagulation Management History:      The patient is taking warfarin and comes in today for a routine follow up visit.  Positive risk factors for bleeding include an age of 70 years or older.  The bleeding index is 'intermediate risk'.  Positive CHADS2 values include History of HTN.  Negative CHADS2 values include Age > 23 years old.  The start date was 04/01/2007.  Her last INR was 3.4 RATIO.  Anticoagulation responsible provider: Juanda Chance MD, Smitty Cords.  INR POC: 2.1.  Cuvette Lot#: [id number].  Exp: 12/2010.    Anticoagulation Management Assessment/Plan:      The patient's current anticoagulation dose is Warfarin sodium 5 mg tabs: Use as directed by Anticoagulation Clinic.  The target INR is 2 - 3.  The next INR is due 01/28/2010.  Anticoagulation instructions were given to patient.  Results were reviewed/authorized by Weston Brass, PharmD.  She was notified by Hoy Register, PharmD Candidate.         Prior Anticoagulation Instructions: INR 2.6  Continue taking 1/2 tablet everyday except 1 tablet on Friday. Recheck in 4 weeks.   Current Anticoagulation Instructions: INR 2.1 Continue previous dose of 1/2 tablet everyday except 1 tablet on Friday. Recheck INR in 4 weeks.

## 2010-03-29 NOTE — Cardiovascular Report (Signed)
Summary: TTM   TTM   Imported By: Roderic Ovens 09/28/2009 14:38:18  _____________________________________________________________________  External Attachment:    Type:   Image     Comment:   External Document

## 2010-03-29 NOTE — Medication Information (Signed)
Summary: cvrr rov   js  Anticoagulant Therapy  Managed by: Cloyde Reams, RN, BSN Referring MD: Olga Millers MD PCP: [facility]; Elie Goody Supervising MD: Eden Emms MD, Theron Arista Indication 1: Atrial Fibrillation (ICD-427.31) Lab Used: [facility] Site: [facility] INR POC 1.6 INR RANGE 2 - 3  Dietary changes: no    Health status changes: yes       Details: Chest Pain.  Bleeding/hemorrhagic complications: yes       Details: Went to [facility] on 05/22/09 d/t nosebleed.    Recent/future hospitalizations: no    Any changes in medication regimen? yes       Details: Avelox 400mg  10 day course started on 05/23/09.  Recent/future dental: no  Any missed doses?: yes     Details: Off coumadin x 6 days, d/t nosebleed on 05/21/09.    Is patient compliant with meds? yes      Comments: INR on 05/22/09 was 5.09.  Pt had been on a prednisone taper x 2 weeks.    Allergies (verified): No Known Drug Allergies  Anticoagulation Management History:      The patient is taking warfarin and comes in today for a routine follow up visit.  Positive risk factors for bleeding include an age of 70 years or older.  The bleeding index is 'intermediate risk'.  Positive CHADS2 values include History of HTN.  Negative CHADS2 values include Age > 30 years old.  The start date was 04/01/2007.  Her last INR was 3.4 RATIO.  Anticoagulation responsible provider: Eden Emms MD, Theron Arista.  INR POC: 1.6.  Exp: 06/2010.    Anticoagulation Management Assessment/Plan:      The patient's current anticoagulation dose is Warfarin sodium 5 mg tabs: Use as directed by Anticoagulation Clinic.  The target INR is 2 - 3.  The next INR is due 05/31/2009.  Anticoagulation instructions were given to patient.  Results were reviewed/authorized by Cloyde Reams, RN, BSN.  She was notified by Cloyde Reams RN.         Prior Anticoagulation Instructions: The patient is to continue with the same dose of coumadin.  This dosage includes: half a  tablet every day except a whole tablet on Mondays and Fridays.  Current Anticoagulation Instructions: INR 1.6  Start back today taking 1/2 tablet daily.  Recheck on Monday.

## 2010-03-29 NOTE — Medication Information (Signed)
Summary: rov/tm  Medications Added QUESTRAN 4 GM PACK (CHOLESTYRAMINE) Twice daily, separate from other medications by at least two hours.      Allergies Added: NKDA Anticoagulant Therapy  Managed by: Rolland Porter, PharmD Referring MD: Olga Millers MD PCP: [facility]; Elie Goody Supervising MD: Jens Som MD, Arlys John Indication 1: Atrial Fibrillation (ICD-427.31) Lab Used: [facility] Site: [facility] INR POC 2.0 INR RANGE 2 - 3  Dietary changes: no    Health status changes: no    Bleeding/hemorrhagic complications: no    Recent/future hospitalizations: no    Any changes in medication regimen? no    Recent/future dental: no  Any missed doses?: no       Is patient compliant with meds? yes       Current Medications (verified): 1)  Diovan 320 Mg Tabs (Valsartan) .Marland Kitchen.. 1 Tab By Mouth Once Daily 2)  Hydrochlorothiazide 12.5 Mg Tabs (Hydrochlorothiazide) .Marland Kitchen.. 1 Tab By Mouth Once Daily 3)  Fish Oil   Oil (Fish Oil) .Marland Kitchen.. 1 Tab By Mouth Once Daily 4)  Warfarin Sodium 5 Mg Tabs (Warfarin Sodium) .... Use As Directed By Anticoagulation Clinic 5)  Potassium Chloride Cr 10 Meq Cr-Caps (Potassium Chloride) .... Take One Tablet By Mouth Daily 6)  Metoprolol Tartrate 50 Mg Tabs (Metoprolol Tartrate) .... Take One and One Half Tablet By Mouth Twice A Day 7)  Prometrium 100 Mg Caps (Progesterone Micronized) .... Once Daily 8)  Vitamin D3 25000 Units .Marland Kitchen.. 1 Tab Bi Weekly 9)  Valtrex 1000 Mg Tabs (Valacyclovir Hcl) .Marland Kitchen.. 1 Tablet Daily 10)  Questran 4 Gm Pack (Cholestyramine) .... Twice Daily, Separate From Other Medications By At [facility] Two Hours.  Allergies (verified): No Known Drug Allergies  Anticoagulation Management History:      Positive risk factors for bleeding include an age of 70 years or older.  The bleeding index is 'intermediate risk'.  Positive CHADS2 values include History of HTN.  Negative CHADS2 values include Age > 75 years old.  The start date was  04/01/2007.  Her last INR was 3.4 RATIO.  Anticoagulation responsible provider: Jens Som MD, Arlys John.  INR POC: 2.0.  Cuvette Lot#: [id number].  Exp: 09/2010.    Anticoagulation Management Assessment/Plan:      The patient's current anticoagulation dose is Warfarin sodium 5 mg tabs: Use as directed by Anticoagulation Clinic.  The target INR is 2 - 3.  The next INR is due 09/17/2009.  Anticoagulation instructions were given to patient.  Results were reviewed/authorized by Rolland Porter, PharmD.  She was notified by Rolland Porter.         Prior Anticoagulation Instructions: INR 2.0 Continue 1/2 pill everyday except 1 pill on Fridays. Recheck in 3-4 weeks.   Current Anticoagulation Instructions: INR 2.0  Increase to 1 tablet on Tuesday's and Friday's, 1/2 pill all other days.   Recheck in 3-4 weeks.

## 2010-03-29 NOTE — Medication Information (Signed)
Summary: rov/ewj  Anticoagulant Therapy  Managed by: Bethena Midget, RN, BSN Referring MD: Olga Millers MD PCP: [facility]; Elie Goody Supervising MD: Clifton James MD, Cristal Deer Indication 1: Atrial Fibrillation (ICD-427.31) Lab Used: [facility] Site: [facility] INR POC 1.6 INR RANGE 2 - 3  Dietary changes: no    Health status changes: no    Bleeding/hemorrhagic complications: no    Recent/future hospitalizations: no    Any changes in medication regimen? yes       Details: Going to restart another 10 day course of Avelox on Wednesday. Also has Hydrocodone-chlorpheniram cough syrup 1 tsp. Q 12hrs  Recent/future dental: no  Any missed doses?: no       Is patient compliant with meds? yes       Allergies: No Known Drug Allergies  Anticoagulation Management History:      The patient is taking warfarin and comes in today for a routine follow up visit.  Positive risk factors for bleeding include an age of 70 years or older.  The bleeding index is 'intermediate risk'.  Positive CHADS2 values include History of HTN.  Negative CHADS2 values include Age > 34 years old.  The start date was 04/01/2007.  Her last INR was 3.4 RATIO.  Anticoagulation responsible provider: Clifton James MD, Cristal Deer.  INR POC: 1.6.  Cuvette Lot#: [id number].  Exp: 06/2010.    Anticoagulation Management Assessment/Plan:      The patient's current anticoagulation dose is Warfarin sodium 5 mg tabs: Use as directed by Anticoagulation Clinic.  The target INR is 2 - 3.  The next INR is due 06/07/2009.  Anticoagulation instructions were given to patient.  Results were reviewed/authorized by Bethena Midget, RN, BSN.  She was notified by Bethena Midget, RN, BSN.         Prior Anticoagulation Instructions: INR 1.6  Start back today taking 1/2 tablet daily.  Recheck on Monday.    Current Anticoagulation Instructions: INR 1.6 Today take extra 1/2 pill then resume 1/2 pill everyday. Recheck INR in one week.

## 2010-03-29 NOTE — Medication Information (Signed)
Summary: rov/mlw  Anticoagulant Therapy  Managed by: Bethena Midget, RN, BSN Referring MD: Olga Millers MD PCP: [facility]; Elie Goody Supervising MD: Excell Seltzer MD, Casimiro Needle Indication 1: Atrial Fibrillation (ICD-427.31) Lab Used: [facility] Site: [facility] INR POC 2.0 INR RANGE 2 - 3  Dietary changes: no    Health status changes: no    Bleeding/hemorrhagic complications: no    Recent/future hospitalizations: no    Any changes in medication regimen? no    Recent/future dental: no  Any missed doses?: no       Is patient compliant with meds? yes       Allergies: No Known Drug Allergies  Anticoagulation Management History:      The patient is taking warfarin and comes in today for a routine follow up visit.  Positive risk factors for bleeding include an age of 70 years or older.  The bleeding index is 'intermediate risk'.  Positive CHADS2 values include History of HTN.  Negative CHADS2 values include Age > 42 years old.  The start date was 04/01/2007.  Her last INR was 3.4 RATIO.  Anticoagulation responsible provider: Excell Seltzer MD, Casimiro Needle.  INR POC: 2.0.  Cuvette Lot#: [id number].  Exp: 09/2010.    Anticoagulation Management Assessment/Plan:      The patient's current anticoagulation dose is Warfarin sodium 5 mg tabs: Use as directed by Anticoagulation Clinic.  The target INR is 2 - 3.  The next INR is due 08/23/2009.  Anticoagulation instructions were given to patient.  Results were reviewed/authorized by Bethena Midget, RN, BSN.  She was notified by Bethena Midget, RN, BSN.         Prior Anticoagulation Instructions: INR 3.1  Hold tomorrow's dose then resume same dose of 1/2 tablet daily (2.5 mg), except one tablet (5 mg) daily.   Next INR on Tuesday, May 31st.   Current Anticoagulation Instructions: INR 2.0 Continue 1/2 pill everyday except 1 pill on Fridays. Recheck in 3-4 weeks.

## 2010-03-29 NOTE — Medication Information (Signed)
Summary: [name]  Anticoagulant Therapy  Managed by: Cloyde Reams, RN, BSN Referring MD: Olga Millers MD Supervising MD: Johney Frame MD, Fayrene Fearing Indication 1: Atrial Fibrillation (ICD-427.31) Lab Used: [facility] Site: [facility] INR POC 1.9 INR RANGE 2 - 3  Dietary changes: no    Health status changes: no    Bleeding/hemorrhagic complications: no    Recent/future hospitalizations: no    Any changes in medication regimen? no    Recent/future dental: no  Any missed doses?: no       Is patient compliant with meds? yes       Allergies (verified): No Known Drug Allergies  Anticoagulation Management History:      The patient is taking warfarin and comes in today for a routine follow up visit.  Positive risk factors for bleeding include an age of 70 years or older.  The bleeding index is 'intermediate risk'.  Positive CHADS2 values include History of HTN.  Negative CHADS2 values include Age > 71 years old.  The start date was 04/01/2007.  Her last INR was 3.4 RATIO.  Anticoagulation responsible provider: Leilanny Fluitt MD, Fayrene Fearing.  INR POC: 1.9.  Cuvette Lot#: [id number].  Exp: 05/2010.    Anticoagulation Management Assessment/Plan:      The patient's current anticoagulation dose is Warfarin sodium 5 mg tabs: Use as directed by Anticoagulation Clinic.  The target INR is 2 - 3.  The next INR is due 04/30/2009.  Anticoagulation instructions were given to patient.  Results were reviewed/authorized by Cloyde Reams, RN, BSN.  She was notified by Cloyde Reams RN.         Prior Anticoagulation Instructions: INR 2.9  Continue Shaw 1/2 tab = 2.5mg  each day except  1 tab = 5mg  on Mon and Fri  Current Anticoagulation Instructions: INR 1.9  Take 1 tablet today and tomorrow then resume same dosage 1/2 tablet daily except 1 tablet on Mondays and Fridays.  Recheck in 4 weeks.

## 2010-03-29 NOTE — Medication Information (Signed)
Summary: rov/nb  Anticoagulant Therapy  Managed by: Bethena Midget, RN, BSN Referring MD: Olga Millers MD PCP: [facility]; Elie Goody Supervising MD: Excell Seltzer MD, Casimiro Needle Indication 1: Atrial Fibrillation (ICD-427.31) Lab Used: [facility] Site: [facility] INR POC 2.2 INR RANGE 2 - 3  Dietary changes: no    Health status changes: no    Bleeding/hemorrhagic complications: no    Recent/future hospitalizations: no    Any changes in medication regimen? yes       Details: Wechol discontinued  Recent/future dental: no  Any missed doses?: no       Is patient compliant with meds? yes       Current Medications (verified): 1)  Diovan 320 Mg Tabs (Valsartan) .Marland Kitchen.. 1 Tab By Mouth Once Daily 2)  Hydrochlorothiazide 12.5 Mg Tabs (Hydrochlorothiazide) .Marland Kitchen.. 1 Tab By Mouth Once Daily 3)  Fish Oil   Oil (Fish Oil) .Marland Kitchen.. 1 Tab By Mouth Once Daily 4)  Warfarin Sodium 5 Mg Tabs (Warfarin Sodium) .... Use As Directed By Anticoagulation Clinic 5)  Potassium Chloride Cr 10 Meq Cr-Caps (Potassium Chloride) .... Take One Tablet By Mouth Daily 6)  Metoprolol Tartrate 50 Mg Tabs (Metoprolol Tartrate) .... Take One and One Half Tablet By Mouth Twice A Day 7)  Prometrium 100 Mg Caps (Progesterone Micronized) .... Once Daily 8)  Valtrex 1000 Mg Tabs (Valacyclovir Hcl) .Marland Kitchen.. 1 Tablet Daily 9)  Levothroid 50 Mcg Tabs (Levothyroxine Sodium) .... Take 1 Tablet By Mouth Daily  Allergies: No Known Drug Allergies  Anticoagulation Management History:      The patient is taking warfarin and comes in today for a routine follow up visit.  Positive risk factors for bleeding include an age of 34 years or older.  The bleeding index is 'intermediate risk'.  Positive CHADS2 values include History of HTN.  Negative CHADS2 values include Age > 50 years old.  The start date was 04/01/2007.  Her last INR was 3.4 RATIO.  Anticoagulation responsible provider: Excell Seltzer MD, Casimiro Needle.  INR POC: 2.2.  Cuvette Lot#:  [id number].  Exp: 01/2011.    Anticoagulation Management Assessment/Plan:      The patient's current anticoagulation dose is Warfarin sodium 5 mg tabs: Use as directed by Anticoagulation Clinic.  The target INR is 2 - 3.  The next INR is due 02/25/2010.  Anticoagulation instructions were given to patient.  Results were reviewed/authorized by Bethena Midget, RN, BSN.  She was notified by Bethena Midget, RN, BSN.         Prior Anticoagulation Instructions: INR 2.1 Continue previous dose of 1/2 tablet everyday except 1 tablet on Friday. Recheck INR in 4 weeks.   Current Anticoagulation Instructions: INR 2.2 Continue 1/2 pill everyday except  pill on Fridays. Recheck in 4 weeks.

## 2010-03-29 NOTE — Medication Information (Signed)
Summary: rov/sak  Anticoagulant Therapy  Managed by: Weston Brass, PharmD Referring MD: Olga Millers MD PCP: [facility]; Elie Goody Supervising MD: Juanda Chance MD, Lalania Haseman Indication 1: Atrial Fibrillation (ICD-427.31) Lab Used: [facility] Site: [facility] INR POC 1.7 INR RANGE 2 - 3  Dietary changes: no    Health status changes: no    Bleeding/hemorrhagic complications: no    Recent/future hospitalizations: no    Any changes in medication regimen? no    Recent/future dental: no  Any missed doses?: no       Is patient compliant with meds? yes       Current Medications (verified): 1)  Diovan 320 Mg Tabs (Valsartan) .Marland Kitchen.. 1 Tab By Mouth Once Daily 2)  Zocor 20 Mg Tabs (Simvastatin) .Marland Kitchen.. 1 Tab By Mouth Once Daily 3)  Hydrochlorothiazide 12.5 Mg Tabs (Hydrochlorothiazide) .Marland Kitchen.. 1 Tab By Mouth Once Daily 4)  Fish Oil   Oil (Fish Oil) .Marland Kitchen.. 1 Tab By Mouth Once Daily 5)  Warfarin Sodium 5 Mg Tabs (Warfarin Sodium) .... Use As Directed By Anticoagulation Clinic 6)  Potassium Chloride Cr 10 Meq Cr-Caps (Potassium Chloride) .... Take One Tablet By Mouth Daily 7)  Metoprolol Tartrate 50 Mg Tabs (Metoprolol Tartrate) .... Take One and One Half Tablet By Mouth Twice A Day 8)  Prometrium 100 Mg Caps (Progesterone Micronized) .... Once Daily 9)  Vitamin D3 25000 Units .Marland Kitchen.. 1 Tab Bi Weekly 10)  Valtrex 1000 Mg Tabs (Valacyclovir Hcl) .Marland Kitchen.. 1 Tablet Daily  Allergies: No Known Drug Allergies  Anticoagulation Management History:      The patient is taking warfarin and comes in today for a routine follow up visit.  Positive risk factors for bleeding include an age of 43 years or older.  The bleeding index is 'intermediate risk'.  Positive CHADS2 values include History of HTN.  Negative CHADS2 values include Age > 45 years old.  The start date was 04/01/2007.  Her last INR was 3.4 RATIO.  Anticoagulation responsible provider: Juanda Chance MD, Smitty Cords.  INR POC: 1.7.  Cuvette Lot#: [id number].   Exp: 07/2010.    Anticoagulation Management Assessment/Plan:      The patient's current anticoagulation dose is Warfarin sodium 5 mg tabs: Use as directed by Anticoagulation Clinic.  The target INR is 2 - 3.  The next INR is due 07/09/2009.  Anticoagulation instructions were given to patient.  Results were reviewed/authorized by Weston Brass, PharmD.  She was notified by Weston Brass PharmD.         Prior Anticoagulation Instructions: INR 4.0  Skip your dose tomorrow, then take 1 mg on Wednesday, then resume 1/2 tablet daily.   Recheck in 10 days  Current Anticoagulation Instructions: INR 1.7  Increase dose to 1/2 tablet every day except 1 tablet on Friday

## 2010-03-29 NOTE — Medication Information (Signed)
Summary: rov/ewj  Medications Added VALTREX 500 MG TABS (VALACYCLOVIR HCL) 1 tablet daily      Allergies Added: NKDA Anticoagulant Therapy  Managed by: Rolland Porter, PharmD Referring MD: Olga Millers MD PCP: [facility]; Elie Goody Supervising MD: Juanda Chance MD, Ameliana Brashear Indication 1: Atrial Fibrillation (ICD-427.31) Lab Used: [facility] [place] Site: [facility] INR POC 4.0 INR RANGE 2 - 3  Dietary changes: no    Health status changes: no    Bleeding/hemorrhagic complications: no    Recent/future hospitalizations: no    Any changes in medication regimen? yes       Details: Valtrex on 4/16  Recent/future dental: no  Any missed doses?: no       Is patient compliant with meds? yes       Current Medications (verified): 1)  Diovan 320 Mg Tabs (Valsartan) .Marland Kitchen.. 1 Tab By Mouth Once Daily 2)  Zocor 20 Mg Tabs (Simvastatin) .Marland Kitchen.. 1 Tab By Mouth Once Daily 3)  Hydrochlorothiazide 12.5 Mg Tabs (Hydrochlorothiazide) .Marland Kitchen.. 1 Tab By Mouth Once Daily 4)  Fish Oil   Oil (Fish Oil) .Marland Kitchen.. 1 Tab By Mouth Once Daily 5)  Warfarin Sodium 5 Mg Tabs (Warfarin Sodium) .... Use As Directed By Anticoagulation Clinic 6)  Potassium Chloride Cr 10 Meq Cr-Caps (Potassium Chloride) .... Take One Tablet By Mouth Daily 7)  Metoprolol Tartrate 50 Mg Tabs (Metoprolol Tartrate) .... Take One and One Half Tablet By Mouth Twice A Day 8)  Prometrium 100 Mg Caps (Progesterone Micronized) .... Once Daily 9)  Vitamin D3 25000 Units .Marland Kitchen.. 1 Tab Bi Weekly  Allergies (verified): No Known Drug Allergies  Anticoagulation Management History:      Positive risk factors for bleeding include an age of 56 years or older.  The bleeding index is 'intermediate risk'.  Positive CHADS2 values include History of HTN.  Negative CHADS2 values include Age > 65 years old.  The start date was 04/01/2007.  Her last INR was 3.4 RATIO.  Anticoagulation responsible provider: Juanda Chance MD, Smitty Cords.  INR POC: 4.0.  Cuvette Lot#: [id number].   Exp: 06/2010.    Anticoagulation Management Assessment/Plan:      The patient's current anticoagulation dose is Warfarin sodium 5 mg tabs: Use as directed by Anticoagulation Clinic.  The target INR is 2 - 3.  The next INR is due 06/25/2009.  Anticoagulation instructions were given to patient.  Results were reviewed/authorized by Rolland Porter, PharmD.  She was notified by Rolland Porter.         Prior Anticoagulation Instructions: INR 2.9  Take 1mg  tablet tomorrow, then resume same dosage 1/2 tablet daily.  Recheck in 1 week.    Current Anticoagulation Instructions: INR 4.0  Skip your dose tomorrow, then take 1 mg on Wednesday, then resume 1/2 tablet daily.   Recheck in 10 days

## 2010-03-29 NOTE — Cardiovascular Report (Signed)
Summary: TTM   TTM   Imported By: Roderic Ovens 01/11/2010 16:07:32  _____________________________________________________________________  External Attachment:    Type:   Image     Comment:   External Document

## 2010-03-29 NOTE — Medication Information (Signed)
Summary: ROV/JAJ   Anticoagulant Therapy  Managed by: Weston Brass, PharmD Referring MD: Olga Millers MD PCP: [facility]; Elie Goody Supervising MD: Myrtis Ser MD, Tinnie Gens Indication 1: Atrial Fibrillation (ICD-427.31) Lab Used: [facility] Site: [facility] INR POC 2.5 INR RANGE 2 - 3  Dietary changes: no    Health status changes: no    Bleeding/hemorrhagic complications: no    Recent/future hospitalizations: no    Any changes in medication regimen? no    Recent/future dental: no  Any missed doses?: no       Is patient compliant with meds? yes       Allergies: No Known Drug Allergies  Anticoagulation Management History:      The patient is taking warfarin and comes in today for a routine follow up visit.  Positive risk factors for bleeding include an age of 70 years or older.  The bleeding index is 'intermediate risk'.  Positive CHADS2 values include History of HTN.  Negative CHADS2 values include Age > 62 years old.  The start date was 04/01/2007.  Her last INR was 3.4 RATIO.  Anticoagulation responsible provider: Myrtis Ser MD, Tinnie Gens.  INR POC: 2.5.  Cuvette Lot#: [id number].  Exp: 12/2010.    Anticoagulation Management Assessment/Plan:      The patient's current anticoagulation dose is Warfarin sodium 5 mg tabs: Use as directed by Anticoagulation Clinic.  The target INR is 2 - 3.  The next INR is due 12/03/2009.  Anticoagulation instructions were given to patient.  Results were reviewed/authorized by Weston Brass, PharmD.  She was notified by Kennieth Francois.         Prior Anticoagulation Instructions: INR 4.3  Hold dose on Saturday and Sunday. Then resume taking 1/2 tablet every day except take 1 tablet on Fridays. Re-check INR in 2 weeks.   Current Anticoagulation Instructions: INR 2.5  Continue one-half tablet every day except for one tablet on Friday.  Return to clinic in three weeks.

## 2010-03-31 NOTE — Medication Information (Signed)
Summary: rov/kh  Anticoagulant Therapy  Managed by: Tammy Sours, PharmD Referring MD: Olga Millers MD PCP: [facility]; Elie Goody Supervising MD: Clifton James MD, Cristal Deer Indication 1: Atrial Fibrillation (ICD-427.31) Lab Used: [facility] Site: [facility] INR POC 2.8 INR RANGE 2 - 3  Dietary changes: no    Health status changes: no    Bleeding/hemorrhagic complications: no    Recent/future hospitalizations: no    Any changes in medication regimen? yes       Details: Dose changes: synthroid 137, kCL , and diovan dose changed to 160/25mg , and pt. stopped taking valtrex   Recent/future dental: no  Any missed doses?: no       Is patient compliant with meds? yes       Allergies: No Known Drug Allergies  Anticoagulation Management History:      The patient is taking warfarin and comes in today for a routine follow up visit.  Positive risk factors for bleeding include an age of 71 years or older.  The bleeding index is 'intermediate risk'.  Positive CHADS2 values include History of HTN.  Negative CHADS2 values include Age > 56 years old.  The start date was 04/01/2007.  Her last INR was 3.4 RATIO.  Anticoagulation responsible provider: Clifton James MD, Cristal Deer.  INR POC: 2.8.  Cuvette Lot#: [id number].  Exp: 01/2011.    Anticoagulation Management Assessment/Plan:      The patient's current anticoagulation dose is Warfarin sodium 5 mg tabs: Use as directed by Anticoagulation Clinic.  The target INR is 2 - 3.  The next INR is due 03/25/2010.  Anticoagulation instructions were given to patient.  Results were reviewed/authorized by Tammy Sours, PharmD.         Prior Anticoagulation Instructions: INR 2.2 Continue 1/2 pill everyday except  pill on Fridays. Recheck in 4 weeks.   Current Anticoagulation Instructions: INR 2.8  Continue taking 1/2 tablet everday except 1 tablet on friday. Recheck INR in 4 weeks.

## 2010-03-31 NOTE — Medication Information (Signed)
Summary: rov/tp   Anticoagulant Therapy  Managed by: Geoffry Paradise, PharmD Referring MD: Olga Millers MD PCP: [facility]; Elie Goody Supervising MD: Tenny Craw MD, Gunnar Fusi Indication 1: Atrial Fibrillation (ICD-427.31) Lab Used: [facility] Site: [facility] INR POC 1.6 INR RANGE 2 - 3  Dietary changes: no    Health status changes: no    Bleeding/hemorrhagic complications: no    Recent/future hospitalizations: no    Any changes in medication regimen? no    Recent/future dental: no  Any missed doses?: no       Is patient compliant with meds? yes       Allergies: No Known Drug Allergies  Anticoagulation Management History:      The patient is taking warfarin and comes in today for a routine follow up visit.  Positive risk factors for bleeding include an age of 71 years or older.  The bleeding index is 'intermediate risk'.  Positive CHADS2 values include History of HTN.  Negative CHADS2 values include Age > 67 years old.  The start date was 04/01/2007.  Her last INR was 3.4 RATIO.  Anticoagulation responsible provider: Tenny Craw MD, Gunnar Fusi.  INR POC: 1.6.  Cuvette Lot#: [id number].  Exp: 02/2011.    Anticoagulation Management Assessment/Plan:      The patient's current anticoagulation dose is Warfarin sodium 5 mg tabs: Use as directed by Anticoagulation Clinic.  The target INR is 2 - 3.  The next INR is due 04/08/2010.  Anticoagulation instructions were given to patient.  Results were reviewed/authorized by Geoffry Paradise, PharmD.         Prior Anticoagulation Instructions: INR 2.8  Continue taking 1/2 tablet everday except 1 tablet on friday. Recheck INR in 4 weeks.   Current Anticoagulation Instructions: INR:  1.6 (goal 2-3)  Your INR is low today.  Take an additional dose of coumadin 5 mg tonight and 1 tablet tomorrow the resume to your regimen of 1/2 a tablet everyday except 1 tablet on Friday.  Please return to clinic in 2 weeks for another INR check.

## 2010-04-07 DIAGNOSIS — I4891 Unspecified atrial fibrillation: Secondary | ICD-10-CM

## 2010-04-08 ENCOUNTER — Encounter: Payer: Self-pay | Admitting: Cardiology

## 2010-04-08 ENCOUNTER — Encounter (INDEPENDENT_AMBULATORY_CARE_PROVIDER_SITE_OTHER): Payer: Medicare Other

## 2010-04-08 DIAGNOSIS — I4891 Unspecified atrial fibrillation: Secondary | ICD-10-CM

## 2010-04-08 DIAGNOSIS — Z7901 Long term (current) use of anticoagulants: Secondary | ICD-10-CM

## 2010-04-14 NOTE — Cardiovascular Report (Signed)
Summary: TTM   TTM   Imported By: Roderic Ovens 04/07/2010 14:56:18  _____________________________________________________________________  External Attachment:    Type:   Image     Comment:   External Document

## 2010-04-14 NOTE — Medication Information (Signed)
Summary: Coumadin Clinic  Anticoagulant Therapy  Managed by: Weston Brass, PharmD Referring MD: Olga Millers MD PCP: [facility]; Tammy Worrell Supervising MD: Daleen Squibb MD, Maisie Fus Indication 1: Atrial Fibrillation (ICD-427.31) Lab Used: [facility] Site: [facility] INR POC 1.6 INR RANGE 2 - 3  Dietary changes: no    Health status changes: no    Bleeding/hemorrhagic complications: no    Recent/future hospitalizations: no    Any changes in medication regimen? no    Recent/future dental: no  Any missed doses?: no       Is patient compliant with meds? yes       Allergies: No Known Drug Allergies  Anticoagulation Management History:      The patient is taking warfarin and comes in today for a routine follow up visit.  Positive risk factors for bleeding include an age of 70 years or older.  The bleeding index is 'intermediate risk'.  Positive CHADS2 values include History of HTN.  Negative CHADS2 values include Age > 71 years old.  The start date was 04/01/2007.  Her last INR was 3.4 RATIO.  Anticoagulation responsible provider: Daleen Squibb MD, Maisie Fus.  INR POC: 1.6.  Cuvette Lot#: [id number].  Exp: 02/2011.    Anticoagulation Management Assessment/Plan:      The patient's current anticoagulation dose is Warfarin sodium 5 mg tabs: Use as directed by Anticoagulation Clinic.  The target INR is 2 - 3.  The next INR is due 04/22/2010.  Anticoagulation instructions were given to patient.  Results were reviewed/authorized by Weston Brass, PharmD.  She was notified by Margot Chimes PharmD Candidate.         Prior Anticoagulation Instructions: INR:  1.6 (goal 2-3)  Your INR is low today.  Take an additional dose of coumadin 5 mg tonight and 1 tablet tomorrow the resume to your regimen of 1/2 a tablet everyday except 1 tablet on Friday.  Please return to clinic in 2 weeks for another INR check.    Current Anticoagulation Instructions: INR 1.6  Today take an extra tablet (you have  already taken 1, so take a second). Then start your new regimen of 1/2 tablet everyday except Mondays and Fridays when you should take 1 tablet.  Recheck INR in 2 weeks.

## 2010-04-22 ENCOUNTER — Encounter (INDEPENDENT_AMBULATORY_CARE_PROVIDER_SITE_OTHER): Payer: Medicare Other

## 2010-04-22 ENCOUNTER — Encounter: Payer: Self-pay | Admitting: Internal Medicine

## 2010-04-22 DIAGNOSIS — Z7901 Long term (current) use of anticoagulants: Secondary | ICD-10-CM

## 2010-04-22 DIAGNOSIS — I4891 Unspecified atrial fibrillation: Secondary | ICD-10-CM

## 2010-04-26 NOTE — Medication Information (Signed)
Summary: rov/ejm   Anticoagulant Therapy  Managed by: Georgina Pillion, PharmD Referring MD: Olga Millers MD PCP: [facility]; Elie Goody Supervising MD: Tenny Craw MD, Gunnar Fusi Indication 1: Atrial Fibrillation (ICD-427.31) Lab Used: [facility] Site: [facility] INR POC 2.2 INR RANGE 2 - 3  Dietary changes: no    Health status changes: no    Bleeding/hemorrhagic complications: no    Recent/future hospitalizations: no    Any changes in medication regimen? no    Recent/future dental: no  Any missed doses?: no       Is patient compliant with meds? yes       Allergies: No Known Drug Allergies  Anticoagulation Management History:      Positive risk factors for bleeding include an age of 70 years or older.  The bleeding index is 'intermediate risk'.  Positive CHADS2 values include History of HTN.  Negative CHADS2 values include Age > 87 years old.  The start date was 04/01/2007.  Her last INR was 3.4 RATIO.  Anticoagulation responsible provider: Tenny Craw MD, Gunnar Fusi.  INR POC: 2.2.  Cuvette Lot#: [id number].  Exp: 02/2011.    Anticoagulation Management Assessment/Plan:      The patient's current anticoagulation dose is Warfarin sodium 5 mg tabs: Use as directed by Anticoagulation Clinic.  The target INR is 2 - 3.  The next INR is due 05/13/2010.  Anticoagulation instructions were given to patient.  Results were reviewed/authorized by Georgina Pillion, PharmD.  She was notified by Georgina Pillion PharmD.         Prior Anticoagulation Instructions: INR 1.6  Today take an extra tablet (you have already taken 1, so take a second). Then start your new regimen of 1/2 tablet everyday except Mondays and Fridays when you should take 1 tablet.  Recheck INR in 2 weeks.    Current Anticoagulation Instructions: Continue current regimen of 1/2 tablet (2.5 mg) daily EXCEPT for 1 tablet (5 mg) on Mondays and Fridays.  INR 2.2The patient is to stop coumadin.

## 2010-05-13 ENCOUNTER — Encounter: Payer: Self-pay | Admitting: Cardiology

## 2010-05-13 ENCOUNTER — Encounter (INDEPENDENT_AMBULATORY_CARE_PROVIDER_SITE_OTHER): Payer: Medicare Other

## 2010-05-13 DIAGNOSIS — Z7901 Long term (current) use of anticoagulants: Secondary | ICD-10-CM

## 2010-05-13 DIAGNOSIS — I4891 Unspecified atrial fibrillation: Secondary | ICD-10-CM

## 2010-05-16 ENCOUNTER — Other Ambulatory Visit: Payer: Self-pay | Admitting: *Deleted

## 2010-05-16 DIAGNOSIS — E049 Nontoxic goiter, unspecified: Secondary | ICD-10-CM

## 2010-05-17 NOTE — Medication Information (Signed)
Summary: [name]      Allergies Added: NKDA Anticoagulant Therapy  Managed by: Samantha Crimes, PharmD Referring MD: Olga Millers MD PCP: [facility]; Tammy Worrell Supervising MD: Daleen Squibb MD, Maisie Fus Indication 1: Atrial Fibrillation (ICD-427.31) Lab Used: [facility] Site: [facility] INR POC 2.3 INR RANGE 2 - 3  Dietary changes: no    Health status changes: no    Bleeding/hemorrhagic complications: no    Recent/future hospitalizations: no    Any changes in medication regimen? no    Recent/future dental: no  Any missed doses?: no       Is patient compliant with meds? yes       Current Medications (verified): 1)  Diovan 320 Mg Tabs (Valsartan) .Marland Kitchen.. 1 Tab By Mouth Once Daily 2)  Hydrochlorothiazide 12.5 Mg Tabs (Hydrochlorothiazide) .Marland Kitchen.. 1 Tab By Mouth Once Daily 3)  Fish Oil   Oil (Fish Oil) .Marland Kitchen.. 1 Tab By Mouth Once Daily 4)  Warfarin Sodium 5 Mg Tabs (Warfarin Sodium) .... Use As Directed By Anticoagulation Clinic 5)  Potassium Chloride Cr 20 Meq Cr-Caps (Potassium Chloride) .... Take One Tablet By Mouth Daily 6)  Metoprolol Tartrate 50 Mg Tabs (Metoprolol Tartrate) .... Take One and One Half Tablet By Mouth Twice A Day 7)  Prometrium 100 Mg Caps (Progesterone Micronized) .... Once Daily 8)  Levothroid 137 Mcg Tabs (Levothyroxine Sodium) .... Take 1 Tablet By Mouth Daily 9)  Diovan-Hct 160/25 Mg .Marland Kitchen.. 1 Tablet Daily  Allergies (verified): No Known Drug Allergies  Anticoagulation Management History:      Positive risk factors for bleeding include an age of 25 years or older.  The bleeding index is 'intermediate risk'.  Positive CHADS2 values include History of HTN.  Negative CHADS2 values include Age > 25 years old.  The start date was 04/01/2007.  Her last INR was 3.4 RATIO.  Anticoagulation responsible provider: Daleen Squibb MD, Maisie Fus.  INR POC: 2.3.  Exp: 02/2011.    Anticoagulation Management Assessment/Plan:      The patient's current anticoagulation dose is  Warfarin sodium 5 mg tabs: Use as directed by Anticoagulation Clinic.  The target INR is 2 - 3.  The next INR is due 06/10/2010.  Anticoagulation instructions were given to patient.  Results were reviewed/authorized by Samantha Crimes, PharmD.         Prior Anticoagulation Instructions: Continue current regimen of 1/2 tablet (2.5 mg) daily EXCEPT for 1 tablet (5 mg) on Mondays and Fridays.  INR 2.2The patient is to stop coumadin.    Current Anticoagulation Instructions: Return to clinic in 4 weeks on 4/13 @ 2:30 Cont with current regimen

## 2010-05-18 ENCOUNTER — Ambulatory Visit
Admission: RE | Admit: 2010-05-18 | Discharge: 2010-05-18 | Disposition: A | Discharge status: Home / Self Care | Payer: Medicare Other | Source: Ambulatory Visit | Attending: *Deleted | Admitting: *Deleted

## 2010-05-18 DIAGNOSIS — E049 Nontoxic goiter, unspecified: Secondary | ICD-10-CM

## 2010-05-22 LAB — COMPREHENSIVE METABOLIC PANEL
AST: 24 U/L (ref 0–37)
BUN: 36 mg/dL — ABNORMAL HIGH (ref 6–23)
CO2: 21 mEq/L (ref 19–32)
Calcium: 8.5 mg/dL (ref 8.4–10.5)
Chloride: 110 mEq/L (ref 96–112)
Creatinine, Ser: 1.29 mg/dL — ABNORMAL HIGH (ref 0.4–1.2)
GFR calc Af Amer: 50 mL/min — ABNORMAL LOW (ref >60)
GFR calc non Af Amer: 41 mL/min — ABNORMAL LOW (ref >60)
Glucose, Bld: 123 mg/dL — ABNORMAL HIGH (ref 70–99)
Total Bilirubin: 0.6 mg/dL (ref 0.3–1.2)

## 2010-05-22 LAB — DIFFERENTIAL
Basophils Absolute: 0 10*3/uL (ref 0.0–0.1)
Eosinophils Absolute: 0.1 10*3/uL (ref 0.0–0.7)
Eosinophils Relative: 1 % (ref 0–5)
Lymphocytes Relative: 12 % (ref 12–46)
Lymphs Abs: 1.1 10*3/uL (ref 0.7–4.0)
Neutrophils Relative %: 76 % (ref 43–77)

## 2010-05-22 LAB — URINE MICROSCOPIC-ADD ON

## 2010-05-22 LAB — GLUCOSE, CAPILLARY

## 2010-05-22 LAB — CBC
HCT: 38.9 % (ref 36.0–46.0)
Hemoglobin: 13.4 g/dL (ref 12.0–15.0)
WBC: 8.7 10*3/uL (ref 4.0–10.5)

## 2010-05-22 LAB — URINALYSIS, ROUTINE W REFLEX MICROSCOPIC
Ketones, ur: NEGATIVE mg/dL
Leukocytes, UA: NEGATIVE
Nitrite: NEGATIVE
Protein, ur: NEGATIVE mg/dL

## 2010-05-22 LAB — POCT CARDIAC MARKERS
Myoglobin, poc: 269 ng/mL (ref 12–200)
Myoglobin, poc: 350 ng/mL (ref 12–200)
Troponin i, poc: <0.05 ng/mL (ref 0.00–0.09)
Troponin i, poc: <0.05 ng/mL (ref 0.00–0.09)

## 2010-05-22 LAB — PROTIME-INR
INR: 5.09 (ref 0.00–1.49)
Prothrombin Time: 46.7 seconds — ABNORMAL HIGH (ref 11.6–15.2)

## 2010-06-02 ENCOUNTER — Other Ambulatory Visit: Payer: Self-pay | Admitting: Internal Medicine

## 2010-06-10 ENCOUNTER — Ambulatory Visit (INDEPENDENT_AMBULATORY_CARE_PROVIDER_SITE_OTHER): Payer: Medicare Other | Admitting: *Deleted

## 2010-06-10 DIAGNOSIS — I4891 Unspecified atrial fibrillation: Secondary | ICD-10-CM

## 2010-06-10 LAB — POCT INR: INR: 3.3

## 2010-06-20 ENCOUNTER — Encounter: Payer: Self-pay | Admitting: Internal Medicine

## 2010-06-20 DIAGNOSIS — I495 Sick sinus syndrome: Secondary | ICD-10-CM

## 2010-07-01 ENCOUNTER — Ambulatory Visit (INDEPENDENT_AMBULATORY_CARE_PROVIDER_SITE_OTHER): Payer: Medicare Other | Admitting: *Deleted

## 2010-07-01 DIAGNOSIS — I4891 Unspecified atrial fibrillation: Secondary | ICD-10-CM

## 2010-07-01 LAB — POCT INR: INR: 4.1

## 2010-07-07 ENCOUNTER — Other Ambulatory Visit: Payer: Self-pay | Admitting: Cardiology

## 2010-07-07 NOTE — Telephone Encounter (Signed)
Pt need refill 

## 2010-07-12 NOTE — Procedures (Signed)
NAME:  [name], [name]               ACCOUNT NO.:  [id number]   MEDICAL RECORD NO.:  [id number]          PATIENT TYPE:  OUT   LOCATION:  SLEEP CENTER                 FACILITY:  [facility]   PHYSICIAN:  Clinton D. Maple Hudson, MD, FCCP, FACPDATE OF BIRTH:  1940/09/09   DATE OF STUDY:  09/24/2008                            NOCTURNAL POLYSOMNOGRAM   REFERRING PHYSICIAN:  Elie Goody, NP   INDICATION FOR STUDY:  Insomnia with sleep apnea.   EPWORTH SLEEPINESS SCORE:  Epworth sleepiness score 8/24, BMI 47.8.  Weight 270 pounds, height 63 inches.  Neck 15 inches.   MEDICATIONS:  Home medications are charted and reviewed.   SLEEP ARCHITECTURE:  Total sleep time 109.5 minutes with sleep  efficiency 25.9%.  Stage I was 27.4%, stage II 72.6%, stages III and REM  were absent.  Sleep latency 119 minutes.  Awake after sleep onset 194.5  minutes.  Arousal index 90.4 indicating increased EEG arousal.  No  bedtime medication was taken.  The patient was noted to cough and  technician described her as extremely anxious throughout the study.   RESPIRATORY DATA:  Apnea/hypopnea index (AHI) 8.2 per hour.  A total of  15 events were scored as hypopneas associated with supine sleep  position.  An additional 56 marginal events not meeting duration and  intensity criteria to be scored as apneas or hypopneas, were still  counted as respiratory effort related arousals for a cumulative  respiratory disturbance index (RDI) of 38.9 per hour.  She had  insufficient early events to permit CPAP titration by split protocol on  the study night.   OXYGEN DATA:  Moderately loud snoring with oxygen desaturation to a  nadir of 86%.  Mean oxygen saturation through the study 91.3% on room  air.   CARDIAC DATA:  Paced rhythm.   MOVEMENT-PARASOMNIA:  Periodic limb movement with a total of 8 limb  jerks counted of which 5 were associated with arousal or awakening for a  periodic limb movement with arousal index of 2.7 per hour.   Bathroom x3.   IMPRESSIONS-RECOMMENDATIONS:  1. Sleep architecture was marked by delayed sleep onset until nearly      midnight, very frequent and prolonged waking throughout the study      before final waking around 4:30 a.m.  The patient was noted to      cough and she was described by the technician as very anxious      throughout the study.  2. Mild to moderate obstructive sleep apnea/hypopnea syndrome, AHI 8.2      per hour, RDI 38.9 per hour.  Events were more common while supine.      Moderately loud snoring with oxygen desaturation to a nadir of 86%      on room air.  3. There were insufficient early events to permit CPAP titration by      split study protocol on this night.  Consider return for CPAP      titration or evaluate for alternative management as appropriate.      If she returns, consider providing a sleep medication to help with      anxiety and relaxation.  Clinton D. Maple Hudson, MD, [facility], FACP  Diplomate, Biomedical engineer of Sleep Medicine  Electronically Signed     CDY/MEDQ  D:  09/27/2008 10:52:49  T:  09/27/2008 12:13:05  Job:  [id number]

## 2010-07-12 NOTE — Assessment & Plan Note (Signed)
[facility]                                 ON-CALL NOTE   NAME:BURNSJeronda, [name]                        MRN:          [id number]  DATE:04/07/2007                            DOB:          1941-02-06    CALL RECEIVED:  April 07, 2007 at approximately in 0945 hours.   CardioNet contact.  States that Ms. Jollie has new onset atrial  fibrillation and the highest ventricular rate has been 97.  Apparently  the monitor self-activated at 1:00 a.m. and 5:00 a.m. again showed  atrial fibrillation.  CardioNet it states that the patient has not  reported any symptoms and that they had not actually spoken with her.   On reviewing cardiology office note dated February 4 by Dr. Jens Som, he  is aware of her history of paroxysmal atrial fibrillation and has placed  her on anticoagulation as well as rate control.  I have asked cardi0net  to continue to monitor her.   PRIMARY CARDIOLOGIST:  Dr. Jens Som.      Joellyn Rued, PA-C  Electronically Signed      Noralyn Pick. Eden Emms, MD, [facility]  Electronically Signed   EW/MedQ  DD: 04/07/2007  DT: 04/08/2007  Job #: [id number]

## 2010-07-12 NOTE — Assessment & Plan Note (Signed)
[facility]                            CARDIOLOGY OFFICE NOTE   SUNDAY, KLOS                        MRN:          [id number]  DATE:04/03/2007                            DOB:          Apr 19, 1940    Mrs. Laba is a very pleasant 70 year old female who has a past medical  history of atrial fibrillation in the setting iatrogenic  hyperthyroidism, hypertension, hyperlipidemia who presents for  evaluation of dyspnea and palpitations.  She did have a cardiac  catheterization performed on August 21, 2002.  At that time she was found  to have an ejection fraction of 65%.  Her LVEDP was 30.  There was no  obstructive coronary disease.  This was performed in the setting of  atrial fibrillation associated with chest pressure.  Note she also had  an echocardiogram performed on June 08, 2004 that showed normal LV  function, moderate left atrial enlargement, mild right atrial  enlargement.  There is mild aortic and mitral regurgitation as well as  tricuspid regurgitation.  Note she typically did have mild dyspnea with  more extreme activities but not with routine activities.  There is  typically no orthopnea or PND.  The can be mild pedal edema.  She does  not have exertional chest pain.  Over the past 2 weeks she has had two  separate episodes of palpitations.  One was when she was getting out of  the shower.  These are described as sudden onset of heart racing.  There  is no associated chest pain or shortness of breath but there is mild  fatigue.  There is also mild dizziness but there is no syncope.  These  episodes are short-lived and approximately 1-2 minutes in duration.  It  feels similar to her atrial fibrillation episodes in the past.  She also  had an episode one morning where she felt mild chest pressure that  resolved after drinking Coke.  She otherwise has not had chest pain.  Her medications include Diovan 320 mg daily, Toprol 50 mg daily,  aspirin  81 mg daily, Zocor 20 mg daily, Prometrium 1 mg daily potassium 10 mEq  daily, fish oil, hydrochlorothiazide unknown dose and as Cerefolin  daily.  She has no known drug allergies.   SOCIAL HISTORY:  She does not smoke nor consume alcohol.   FAMILY HISTORY:  Significant for pacemakers in her mother, father and  brother.   PAST MEDICAL HISTORY:  There is no diabetes mellitus but there is  hypertension, hyperlipidemia.  She does have a history of iatrogenic  hyperthyroidism.  She has a history of paroxysmal atrial fibrillation in  the past.  She has had a previous tonsillectomy and left rotator cuff  repair on her left shoulder.   REVIEW OF SYSTEMS:  She denies any headaches or fevers, chills.  There  is no productive cough or hemoptysis.  There is no dysphagia,  odynophagia, melena or hematochezia.  There is no dysuria. No rash or  seizure active.  There is no orthopnea or PND but there is pedal edema.  Remaining systems are  negative.   PHYSICAL EXAMINATION:  Today shows a blood pressure of 146/87 and pulse  is 58.  She weighs 268 pounds.  She is well-developed and obese.  She is no acute stress.  Skin is warm, dry.  She is not depressed and there is no peripheral  clubbing.  BACK:  Normal.  Her HEENT is normal with normal eyelids.  Her neck is supple with normal upstroke bilaterally.  No bruits noted.  There is no jugular distention and no thyromegaly is noted.  Her chest is clear to auscultation.  Normal expansion.  CARDIOVASCULAR:  Regular rhythm.  Normal S1-S2.  I cannot appreciate  murmurs, rubs or gallops.  ABDOMINAL:  Nontender.  Nondistended.  Positive bowel sounds.  No  hepatomegaly no mass appreciated.  EXTREMITIES:  Show no cords.  She has trace to 1+ edema.  NEUROLOGIC:  Exam is grossly intact.   Her electrocardiogram shows a sinus rhythm at a rate of 56.  There are  no ST changes noted.   DIAGNOSIS:  1. Probable recurrent atrial fibrillation - Mrs.  Oley is having      palpitations similar to her symptoms when she had her atrial      fibrillation in the past.  However, this is not documented.  We      will schedule her have a CardioNet monitor to more fully assess      also echocardiogram to require prior LV function.  I will also      check a TSH.  Given that she has embolic risk factors of      hypertension and age greater than 21 and she has had documented      paroxysmal atrial fibrillation with past.  I feel that she should      begin on Coumadin with a goal INR two to three.  Will begin 5 mg      p.o. daily and she will be seen in our Coumadin clinic on April 08, 2007 for new patient evaluation.  2. A recent episode of chest heaviness - she did have a      catheterization in her 2004 that showed no coronary disease.  She      also stated her symptoms improved with drinking Coke.  Her      electrocardiogram is normal.  However, we will plan to risk      stratify with a Myoview.  If this shows normal perfusion we will      not further pursue further ischemia evaluation.  3. Hypertension - her blood pressure appears to be adequate controlled      on present medications.  No we will continue with Diovan and      Toprol.  The Toprol should also help with her atrial fibrillation.      I am hesitant to increase this further as resting heart rate is 56.      If she indeed has atrial fibrillation with rapid ventricle response      and future then we may need to consider a pacemaker and then      increasing her AV nodal blocking agents.  4. Hyperlipidemia - she will continue on a statin.  We will check      lipids and liver.  5. History of hypothyroidism - we will check a TSH.   We will see her back in approximately 6 to 8 weeks to review the above  information.     Madolyn Frieze  Jens Som, MD, [facility]  Electronically Signed    BSC/MedQ  DD: 04/03/2007  DT: 04/03/2007  Job #: [id number]

## 2010-07-12 NOTE — Assessment & Plan Note (Signed)
[facility]                         ELECTROPHYSIOLOGY OFFICE NOTE   NAME:Shaw, Emily                        MRN:          [id number]  DATE:09/09/2007                            DOB:          [birth year]    [name] returns today for followup.  She is a very pleasant of 1-day  history of paroxysmal AFib as well as coronary artery disease (minimal)  who has symptomatic bradycardia and tachycardia.  She returns today for  followup.  The patient is status post permanent pacemaker insertion back  in March.  Her only note is today that she has very mild shortness of  breath.   CURRENT MEDICATIONS:  1. Diovan 320 a day.  2. Zocor 20 a day.  3. Potassium 10 mEq daily.  4. Fish oil supplement.  5. HCTZ 25 a day.  6. Coumadin as directed.  7. Metoprolol 75 mg twice daily.   On physical exam, she is a pleasant, obese woman in no acute distress.  Blood pressure today was 110/70, the pulse was 68 and regular, and the  weight was 277 pounds.  Neck revealed no jugular venous distention.  Lungs were clear bilaterally to auscultation.  No wheezes, rales, or  rhonchi were present.  Cardiovascular exam revealed regular rate and  rhythm.  Normal S1 and S2.  Abdominal exam was soft, nontender, and  nondistended.  There was no organomegaly.  Extremities demonstrated no  cyanosis, clubbing, or edema.  Pacemaker insertion site was healed  nicely.   Interrogation of her pacemaker demonstrates a Engineer, structural.  P-waves  were 2.  The R wave is 10.  The impedance is 471 in the A and 628 in the  V.  The threshold in the A was 0.75 at 0.4 and in the RV 0.875 at 0.4.  Battery voltage was 2.79 volts.  Her underlying rhythm was sinus brady  at 52.  There was 36 mode switches which represented 15% of the  duration.   IMPRESSION:  1. Symptomatic tachybrady syndrome.  2. Status post pacemaker insertion.  3. Obesity.  4. Hypertension.   DISCUSSION:  [name] is stable  and her pacemaker is working normally.  We will plan to see her back in followup in approximately 1 year.     Doylene Canning. Ladona Ridgel, MD  Electronically Signed   GWT/MedQ  DD: 09/09/2007  DT: 09/10/2007  Job #: [id number]

## 2010-07-12 NOTE — Op Note (Signed)
NAME:  [name], [name]               ACCOUNT NO.:  [id number]   MEDICAL RECORD NO.:  [id number]          PATIENT TYPE:  OIB   LOCATION:  2855                         FACILITY:  [facility]   PHYSICIAN:  Doylene Canning. Ladona Ridgel, MD    DATE OF BIRTH:  12/08/1940   DATE OF PROCEDURE:  05/14/2007  DATE OF DISCHARGE:                               OPERATIVE REPORT   PROCEDURE PERFORMED:  Implantation of a dual chamber pacemaker.   INDICATIONS:  Symptomatic tachy-brady syndrome with paroxysmal atrial  fibrillation.   INTRODUCTION:  The patient is a 70 year old lady with a history of  atrial fibrillation for several years.  This is paroxysmal/persistent.  Unfortunately, she has had spells of bradycardia where she has become  dizzy and light-headed and almost passed out and this is associated with  pauses up to 5 seconds.  At other times, she has atrial fibrillation  with a rapid ventricular response at rates up to 150 beats per minute.  This is also despite medical therapy.  For this reason, she is now  referred for dual chamber pacemaker implantation.   PROCEDURE:  After informed consent was obtained, the patient was taken  to the diagnostic EP lab in a fasting state.  After the usual  preparation and draping, intravenous fentanyl and midazolam was given  for sedation.  Valium was also given.  30 mL lidocaine was infiltrated  in the left infraclavicular region and a 5 cm incision was carried out  over this region.  Electrocautery was utilized to dissect down to the  fascial plane.  The left subclavian vein was punctured x2.  It was noted  that advancement of the guidewire into the subclavian vein resulted in  the patient demonstrating a persistent left superior vena cava.  Venography of the left subclavian vein subsequently confirm this  finding.  The St. Jude ST model 1788T 58 cm active fixation pacing lead,  serial number [id number], was then advanced through the persistent left  superior vena cava into  the right atrium and the St. Jude model 1780T,  52 cm active fixation pacing lead, serial number [id number], was advanced  into the right atrium in the same manner as the RV lead.  A long curve  was placed in the stylet and the ventricular lead was subsequently  advanced into the right ventricle by crossing the tricuspid valve and  prolapsing the lead body into the right ventricle.  The lead was  actively fixed on the RV septum and with active fixation, the R-waves  measured 10, the impedance was 675 ohms, and the threshold 0.6 volts at  0.5 milliseconds.  A large injury of current was demonstrated.  10 volts  pacing did not stimulate the diaphragm.  With the ventricular lead in  satisfactory position, attention was then turned placement of the atrial  lead which was placed on the lateral wall of the right atrium where  fibrillation waves measured over 5 mV and the pacing impedance was 499  ohms in the AO pacing mode.  With these satisfactory parameters, the  leads were secured to the subpectoralis fascia with  a figure-of-eight  silk suture and the sewing sleeve was secured with a silk suture.  Electrocautery was utilized to make a subcutaneous pocket.  Kanamycin  irrigation was utilized to irrigate the pocket and electrocautery was  utilized to assure hemostasis.  The St. Jude [place], [place] 9528  dual chamber pacemaker, serial number [id number], was connected to the  atrial and ventricular leads and placed back in the subcutaneous pocket.  The generator was secured with a silk suture and the pocket was again  irrigated with kanamycin and the incision then closed with a layer of 2-  0 Vicryl followed by a layer of 3-0 Vicryl.  Benzoin was painted on the  skin, Steri-Strips were applied, and a pressure dressing was placed.  The patient was returned to her room in satisfactory condition.   COMPLICATIONS:  There were no immediate procedure complications.   RESULTS:  This demonstrates  successful implantation of the St. Jude dual  chamber pacemaker in a patient with symptomatic tachy-brady syndrome and  a persistent left superior vena cava.      Doylene Canning. Ladona Ridgel, MD  Electronically Signed     GWT/MEDQ  D:  05/14/2007  T:  05/14/2007  Job:  [id number]   cc:   Madolyn Frieze. Jens Som, MD, [facility]

## 2010-07-12 NOTE — Assessment & Plan Note (Signed)
[facility]                            CARDIOLOGY OFFICE NOTE   NAME:Emily, Shaw                        MRN:          [id number]  DATE:05/31/2007                            DOB:          [birth year]    [name] is a very pleasant 70 year old female that I saw on April 03, 2007 for recurrent palpitations that we felt was most likely atrial  fibrillation as she did have a history of that.  She was also having  some chest pressure at times.  We did schedule her to have a Myoview on  April 16, 2007.  She was found to have breast attenuation but there  was no ischemia or infarction and her ejection fraction was 59%.  She  had a repeat echocardiogram on April 04, 2007 that showed normal LV  function.  There was mild LVH.  There was trivial aortic insufficiency  and mitral regurgitation. We also performed a CardioNet. She was found  to have recurrent atrial fibrillation and she was having pauses as well.  One of these was up to 5 seconds. She subsequently was seen by Dr.  Ladona Ridgel and on May 14, 2007 she had a pacemaker placed for tachybrady  syndrome.  Since then, she has done well.  She denies any increased  dyspnea, chest pain, palpitations or syncope.   CURRENT MEDICATIONS:  1. Diovan 320 mg p.o. daily.  2. Cephazolin.  3. Zocor 20 mg daily.  4. Prometrium potassium 10 mEq p.o. daily.  5. Fish oil.  6. Hydrochlorothiazide 6.25-12.5 mg daily.  7. Coumadin as directed.  8. Metoprolol 50 mg p.o. b.i.d.   PHYSICAL EXAM:  A blood pressure of 134/77 and her pulse was 59. She  weighs 264 pounds.  HEENT:  Normal.  NECK:  Supple.  CHEST:  Clear.  CARDIOVASCULAR:  Regular rate and rhythm. Her pacemaker site is without  evidence of hematoma or infection.  ABDOMEN:  Shows no tenderness.  EXTREMITIES:  Show trace edema.   Review of her recent pacemaker interrogation apparently reveals that her  heart rate is above 100, 30% of the time.   DIAGNOSES:  1. Tachybrady syndrome - she is status post pacemaker placement and      there is no evidence of infection or hematoma.  We will continue      with her present medications.  2. Atrial fibrillation - her heart rate was elevated to greater than      100, 30% of the  time on her recent pacemaker check.  I have asked      her to increase her Lopressor to 75 mg p.o. b.i.d.  If her blood      pressure starts to decline on this then we can discontinue her      Avapro.  She will continue on Coumadin with a goal INR of 2-3 as      she has age greater than 70 and also a history of hypertension.  3. Hypertension - her blood pressure is adequately controlled on her      present medications.  4. Hyperlipidemia -  she will continue on her statin.  5. History of hypothyroidism - her recent TSH was normal.   We will see her back in 4 months.     Madolyn Frieze Jens Som, MD, [facility]  Electronically Signed    BSC/MedQ  DD: 05/31/2007  DT: 05/31/2007  Job #: [id number]

## 2010-07-12 NOTE — Discharge Summary (Signed)
NAME:  [name], [name]               ACCOUNT NO.:  [id number]   MEDICAL RECORD NO.:  [id number]          PATIENT TYPE:  INP   LOCATION:  3736                         FACILITY:  [facility]   PHYSICIAN:  Doylene Canning. Ladona Ridgel, MD    DATE OF BIRTH:  Nov 15, 1940   DATE OF ADMISSION:  05/14/2007  DATE OF DISCHARGE:  05/15/2007                               DISCHARGE SUMMARY   FINAL DIAGNOSES:  1. Discharging day #1 status post implant of a St. Jude Zephyr  XL DR      dual-chamber pacemaker, Dr. Lewayne Bunting.  2. The patient has history of tachycardia-bradycardia syndrome with      greater than 5-second pauses.   SECONDARY DIAGNOSES:  1. Myoview study April 16, 2007, ejection fraction 59%. No ischemia      and no infarct.  2. Echocardiogram April 04, 2007, ejection fraction 55-60%.  3. Hypertension.  4. Dyslipidemia.  5. Hypothyroidism.  6. History of bronchitis.  7. Paroxysmal atrial fibrillation on chronic Coumadin.   PROCEDURE:  May 14, 2007, implant of St. Jude Zephyr dual-chamber  pacemaker.  The patient has no postprocedural complications.  Her  incision has no hematoma or ecchymoses.  She is in sinus rhythm with  intermittent atrial pacing on the morning of discharge   BRIEF HISTORY:  [name] is a 70 year old female.  She has history of  atrial fibrillation going back several years.  It is paroxysmal. She has  had spells of bradycardia, and she becomes dizzy and lightheaded. She  had presyncope at some times. This is associated with pauses up to 5  seconds. At other times, she has atrial fibrillation with rapid  ventricular rates with rates up to 150 beats per minute.  She is  refractory to medical therapy for rapid rates. The patient is now  referred for dual-chamber pacemaker implantation.   HOSPITAL COURSE:  The patient presents electively on May 14, 2007, for  pacemaker.  She has tachybrady syndrome.  She has pauses up to 5  seconds.  She also has rapid rates up to 150  beats per minute. She had  implantation of the a St. Jude dual-chamber pacemaker without  complication.  She was in a period of atrial fibrillation overnight, but  is currently sinus rhythm postprocedure day #1 in the morning.  She had  interrogation of the device .  Atrial fibrillation suppression was  turned on at interrogation.  The PV ARP is 275 milliseconds.  The PV AB  is 130 milliseconds. As mentioned above, the patient's incision is  healing well. Medial to the incision, unfortunately, there is a small  abrasion from tape tear. This will be treated with antibiotic ointment.  The patient has been counseled not to get her incision wet for the next  7 days, to sponge bathe until Tuesday, March 24.  Mobility of the left  arm has been described to the patient.  INR on the day of discharge 1.7,  but the sample was hemolyzed. The patient is on her normal Coumadin  dosing. Her blood pressure has been running low, but she has apparently  required heavy doses of sedation, and it is felt that this blood  pressure will rebound.   The patient discharged on the following medications.  1. A new medication, metoprolol 50 mg twice daily. Prior to this, she      had been on Toprol 50 mg daily.  She is asked to stop this.  2. Diovan 325 mg daily.  3. Cerefolin/ folic acid tablets daily.  4. Zocor 20 mg daily at bedtime.  5. Prometrium 100 mg daily.  6. Potassium chloride 10 mEq daily.  7. Hydrochlorothiazide 25 mg 1/2 tablet daily.  8. Coumadin.  She takes 5 mg daily except 2.5 mg Sunday, Tuesday and      Friday.   LABORATORY STUDIES PERTINENT TO THIS ADMISSION:  Were drawn on May 13, 2007.  Complete blood count:  White cells of 5.5, hemoglobin 14.5,  hematocrit 44.3 and platelets of 191.  Pro time was 23.5, INR 3.4.  She  is asked to hold her Coumadin on March 16, the day of the test, and also  to eat 1/2 can of collard greens overnight.  This was successful in that  her INR was 2.3 on  morning of March 17.  Her serum electrolytes on March  16 were sodium 140, potassium 3.5, chloride 107, carbonate 25, glucose  115, BUN 37, creatinine 1.      Maple Mirza, PA      Doylene Canning. Ladona Ridgel, MD  Electronically Signed    GM/MEDQ  D:  05/15/2007  T:  05/15/2007  Job:  [id number]   cc:   Madolyn Frieze. Jens Som, MD, [facility]  [name]

## 2010-07-12 NOTE — Assessment & Plan Note (Signed)
[place] [facility]                            CARDIOLOGY OFFICE NOTE   NAME:[name], [name]                        MRN:          [id number]  DATE:09/25/2007                            DOB:          [birth year]    HISTORY OF PRESENT ILLNESS:  [name] is a pleasant female that I had  seen in the past for paroxysmal atrial fibrillation.  A previous Myoview  on April 16, 2007, showed breast attenuation, but no ischemia or  infarction and her ejection fraction was 59%.  An echocardiogram in  February 2009, showed normal LV function.  However, CardioNet monitor at  that time showed atrial fibrillation and she was having significant  pauses as well.  She ultimately underwent a pacemaker placement for  tachybrady syndrome.  Since I last saw her, she denies any dyspnea,  chest pain, palpitations, or pedal edema.  However, she does state that  she has weak spells at times, and she feels it may be related to her low  blood pressure.  She has not had frank syncope.  These typically lasts  for approximately 1-hour and they improve when she lies down.  Again,  she has not had syncope.   MEDICATIONS:  Her medications at present include:  1. Diovan 320 mg p.o. daily.  2. Zocor 20 mg p.o. daily.  3. Prometrium.  4. Potassium 2 mEq p.o. daily.  5. Fish oil.  6. Hydrochlorothiazide 12.5 mg p.o. daily.  7. Coumadin.  8. Lopressor 75 mg p.o. b.i.d.   PHYSICAL EXAMINATION:  VITAL SIGNS:  Shows a blood pressure of 100/60  and pulse is 81.  She weighs 163 pounds.  HEENT:  Normal.  NECK:  Supple.  CHEST:  Clear.  CARDIOVASCULAR:  Regular rate and rhythm.  ABDOMEN:  Shows no tenderness.  EXTREMITIES:  Show no edema.   DIAGNOSES:  1. Weakness - this appears to be related to low blood pressure.  I      will discontinue her hydrochlorothiazide and potassium.  If these      symptoms persist, we could also decrease her Diovan.  We will check      a BMET in 1 week to follow  her potassium and renal function given      the adjustment in her diuretic regimen.  2. Tachybrady syndrome, status post pacemaker placement - management      per Dr. Ladona Ridgel.  3. History of paroxysmal atrial fibrillation - she will continue on      her Lopressor and Coumadin.  Note, she is in an atrial paced rhythm      today.  4. Coumadin therapy - this is being managed in the [facility].      When she returns for her blood work, I will have a CBC checked.  5. Hypertension - we are adjusting her blood pressure medicines as      outlined above.  6. Hyperlipidemia - we will check lipids and liver adjust her Zocor as      indicated.  7. History of hypothyroidism.   I will see her back in  9 months.     Madolyn Frieze Jens Som, MD, [facility]  Electronically Signed    BSC/MedQ  DD: 09/25/2007  DT: 09/26/2007  Job #: [id number]

## 2010-07-12 NOTE — Assessment & Plan Note (Signed)
[facility]                         ELECTROPHYSIOLOGY OFFICE NOTE   NAME:Shaw, Emily                        MRN:          [id number]  DATE:05/06/2008                            DOB:          Aug 22, 1940    HISTORY OF PRESENT ILLNESS:  [name] returns today for followup.  She  is a very pleasant 70 year old woman with a history of sick sinus  syndrome and symptomatic tachy-brady syndrome who underwent permanent  pacemaker insertion back in March 2009.  She returns today for yearly  followup.  The patient has been quite stable.  She does not feel  palpitations or I cannot tell neither a pacemaker is in, that she is  even going into AFib.  She has otherwise been stable.  The patient notes  that her main complaint is inability to lose weight.  Over the last  year, her weight has really fluctuated quite a bit, going up and down  between 20 pounds from one visit to the next.  She denies chest pain.  She denies shortness of breath.  She has mild peripheral edema.   MEDICINES:  Include  1. Diovan 320 a day.  2. Zocor 20 a day.  3. Potassium 10 mEq daily.  4. Prometrium 100 mg daily.  5. Fish oil supplement.  6. HCTZ 25 mg half tablet daily.  7. Coumadin as directed.  8. Metoprolol 75 twice daily.   PHYSICAL EXAMINATION:  GENERAL:  She is a pleasant, obese middle-aged  woman, in no distress.  VITAL SIGNS:  Blood pressure was 146/92, the pulse was 60 and regular,  respirations were 18, and the weight was 284 pounds.  NECK:  Revealed no jugular venous distention.  LUNGS:  Clear bilaterally to auscultation.  No wheezes, rales or rhonchi  were present.  There is no increased work of breathing.  CARDIOVASCULAR:  Revealed a regular rate and rhythm with normal S1 and  S2.  ABDOMEN:  Soft and nontender.  EXTREMITIES:  Demonstrated no edema.   Interrogation of the pacemaker demonstrates a St. Jude [place], model  4098.  The P-waves were greater than 5 and  R waves were 1.  The  impedance was 381 in the atrium, 732 in the right ventricle.  The  threshold 0.5 at 0.4 in the A and 0.625 at 0.4 in the right ventricle.  The battery voltage was 2.8 volts.  There were 223 mode switches.  She  was pacing in the atrium greater than 99% of the time, V pacing less  than 1%.   IMPRESSION:  1. Paroxysmal atrial fibrillation.  Her ventricular rates are      controlled and she is maintaining sinus rhythm for the most part.      Her symptoms have resolved.  2. Symptomatic tachy-brady.  The patient is status post pacemaker and      has had no more dizziness or lightheaded spells.  3. Morbid obesity.  The patient has been frustrated by her inability      to lose weight.  She is considering Weight Watchers and Chief Operating Officer.  I have also encouraged her if she is unable to lose      weight by dieting and exercise, which is very difficult with her      advanced weight to consider bariatric surgery.   I will plan to see the patient back for pacemaker followup in 1 year.  She still have follow up with my partner Dr. Jens Som as well.     Doylene Canning. Ladona Ridgel, MD  Electronically Signed    GWT/MedQ  DD: 05/06/2008  DT: 05/07/2008  Job #: [id number]

## 2010-07-12 NOTE — Assessment & Plan Note (Signed)
[facility]                         ELECTROPHYSIOLOGY OFFICE NOTE   NAME:[name], [name]                        MRN:          [id number]  DATE:05/13/2007                            DOB:          [birth year]    REFERRING PHYSICIAN:  Madolyn Frieze. Jens Som, MD, [facility]   REASON FOR REFERRAL:  Ms. Eifert is referred today by Dr. Olga Millers  for consideration for permanent pacemaker insertion.   HISTORY OF PRESENT ILLNESS:  The patient is a very pleasant 70 year old  woman who has a history of hypertension and dyslipidemia who also has  had paroxysmal atrial fibrillation for approximately four years.  Over  the last several months, she has noticed increasing fatigue and weakness  with exertion.  She has also noted dizzy spells where she feels like she  is about to pass out but she is not.  She was subsequently found on  cardiac monitoring to have  bradycardia with positive up to 5 seconds as  well as atrial fibrillation with a rapid ventricular response with heart  rates of over 130-140 beats per minute.  The patient has had no frank  syncope.   MEDICATIONS:  1. Diovan 320 mg daily.  2. Zocor 20 mg daily.  3. Potassium.  4. Coumadin as directed.  5. HCTZ 25 mg half tablet daily p.r.n.  6. Potassium supplements.  7. She had previously been on Toprol, but she is not now.   PAST MEDICAL HISTORY:  1. Hypertension.  2. Dyslipidemia.  3. Hypothyroidism.  4. Bronchitis.   FAMILY HISTORY:  Notable for her father deceased at age 52 of  Alzheimer's disease and mother who had a stroke in her 54s.  She has one  brother with a pacemaker.   SOCIAL HISTORY:  The patient is married.  She works as a Corporate treasurer.  She denies tobacco or ethanol abuse.   REVIEW OF SYSTEMS:  As noted in the HPI.  Otherwise, all systems were  reviewed and negative except as noted above.   PHYSICAL EXAMINATION:  GENERAL:  Pleasant, obese, middle aged woman in  no distress.  VITAL SIGNS:  Blood pressure 130/88, pulse 99 and irregular,  respirations 18, weight 218 pounds.  HEENT:  Normocephalic, atraumatic.  Pupils equal and round.  Oropharynx  is moist.  Sclerae is anicteric.  NECK:  No jugular venous distention.  No thyromegaly.  Trachea was  midline.  Carotids are 2+ and symmetric.  LUNGS:  Clear bilaterally to auscultation.  No wheezes, rales or  rhonchi.  No increased work of breathing.  CARDIOVASCULAR:  Irregular rate and rhythm.  Normal S1, S2.  Heart  sounds were distant.  There was no enlargement of the PMI, nor was it  laterally displaced.  ABDOMEN:  Obese, soft, nontender.  No organomegaly.  Bowel sounds were  present.  No rebound or guarding.  EXTREMITIES:  No cyanosis, clubbing or edema.  Pulses were 2+ and  symmetric.  NEUROLOGICAL:  Alert and oriented x3 with cranial nerves intact.  Strength 5/5 and symmetric.   STUDIES:  EKG demonstrates atrial fibrillation with a controlled  ventricular response at 99 beats per minute.  Review of the patient's  electrocardiograms under cardiac monitor demonstrates positive up to 5  seconds in atrial fibrillation.   IMPRESSION:  1. Symptomatic tachybrady syndrome.  2. Hypertension.  3. Dyslipidemia.  4. Chronic Coumadin therapy.   DISCUSSION:  Ms. Kozloski is stable, but clearly needs a pacemaker.  Will  check her INR today and plan pacemaker implantation once her INR is less  than 2.5.     Doylene Canning. Ladona Ridgel, MD  Electronically Signed    GWT/MedQ  DD: 05/13/2007  DT: 05/13/2007  Job #: [id number]   cc:   Madolyn Frieze. Jens Som, MD, [facility]

## 2010-07-15 ENCOUNTER — Ambulatory Visit (INDEPENDENT_AMBULATORY_CARE_PROVIDER_SITE_OTHER): Payer: Medicare Other | Admitting: *Deleted

## 2010-07-15 DIAGNOSIS — I4891 Unspecified atrial fibrillation: Secondary | ICD-10-CM

## 2010-07-15 LAB — POCT INR: INR: 3.8

## 2010-07-15 NOTE — Discharge Summary (Signed)
NAME:  [name], [name]                           ACCOUNT NO.:  [id number]   MEDICAL RECORD NO.:  [id number]                   PATIENT TYPE:  INP   LOCATION:                                       FACILITY:  [facility]   PHYSICIAN:  Cecil Cranker, M.D.             DATE OF BIRTH:  1940/06/15   DATE OF ADMISSION:  08/19/2002  DATE OF DISCHARGE:  08/23/2002                                 DISCHARGE SUMMARY   DISCHARGE DIAGNOSES:  1. Paroxysmal atrial fibrillation/flutter - now in normal sinus rhythm.  2. Chest pain.     a. Cardiac catheterization this admission revealing trivial coronary        artery disease and normal left ventricular function.  3. Coumadin therapy - started this admission.     a. International Normalized Ratio 1.4 at discharge.  4. Hypothyroidism.     a. Thyroid replacement currently on hold secondary to low thyroid-        stimulating hormone.  5. Hypertension.  6. History of rhinitis.   PROCEDURES PERFORMED THIS ADMISSION:  Cardiac catheterization by Dr. Wynona Luna on August 21, 2002 revealing trivial CAD, normal LV systolic function  with an EF of greater than 55%.  No MR.   HOSPITAL COURSE:  Please see the admission history and physical by Dr.  Graceann Congress on August 19, 2002 for complete details.  Briefly, this 70-year-  old female was referred to our office by Dr. [name] for atrial  fibrillation.  During her initial visits she was in sinus rhythm and plans  were to originally set her up for echocardiogram, Coumadin and stress test  versus catheterization.  While in the office she converted back into atrial  flutter with a rate of approximately 150 beats per minute.  Attempts were  made at converting her but these were unsuccessful and she was admitted  directly to [facility] for anticoagulation, rate control and  further evaluation of her chest pain.  Her HCTZ was discontinued.  Toprol XL  25 mg daily was added.  She went for cardiac  catheterization on August 21, 2002 to further evaluate her chest pain.  This revealed mild left main  disease, small diagonal one, large septal and luminal disease of the LAD.  Two obtuse marginals with luminal disease.  Dominant RCA with PDA and two PV  branches with luminal disease.  No MR.  She tolerated the procedure well and  had no immediate complications.  She had a Perclose procedure.  She was  stable on August 22, 2002 and her INR was 1.0.  She was continued on Heparin.  Dr. Dietrich Pates saw the patient on August 23, 2002 and thought she was stable  enough for discharge to home with an INR of 1.4.  She will go home on  Coumadin 5 mg daily.  We will see her in our office at [facility]  on Monday, August 25, 2002 for a PT and INR check and our office will contact  her with a time.  She will see Dr. Graceann Congress and the PA in two weeks  for follow up.  Of note, Dr. Chales Abrahams stated in his note from August 21, 2002  that the patient would need to remain on Coumadin for approximately three  months.  Her thromboembolic risk factors include hypertension only.  Dr.  Dietrich Pates noted that her risk for thromboembolism was relatively low if  paroxysmal atrial fibrillation/flutter is not secondary to iatrogenic  hyperthyroidism.  He suggested resuming her thyroid medication back at 120  mg, 1/2 tablet daily in three weeks.  Of note, her TSH at [facility] was 0.364.  She was discharged home on August 23, 2002 in stable condition.   PLAN:  White count 6300; hemoglobin 12.3; hematocrit 36, platelet count  165,000.  Sodium 140, potassium 4, BUN 15, creatinine 1, glucose 108.  INR  1.4.  Magnesium 2.  Peptide level 108.  Cardiac enzymes negative times  three.  Total cholesterol 174, triglycerides 70, HDL 71, LDL 89.  TSH as  noted above.   Chest x-ray on admission:  Bilateral basilar hypoaeration.  No evidence of  active disease of the chest.   DISCHARGE MEDICATIONS:  1. Lipitor 20 mg q.h.s.  2. Aspirin 81  mg q.d.  3. Diovan 320 mg q.d.  4. Toprol XL 25 mg q.d.  5. Coumadin 5 mg q.d.   The patient has been advised to stop taking her Amour thyroid and  triamterene/HCTZ.   PAIN MANAGEMENT:  Tylenol as-needed.   ACTIVITY:  No driving or lifting.  No work for three days.   DIET:  Low fat, low sodium.   WOUND CARE:  The patient should call the office in [facility] for any groin  bleeding or bruising.   SPECIAL INSTRUCTIONS:  She has been advised to resume her thyroid 120 mg 1/2  tablet once daily in three weeks and to see Dr. Alessandra Bevels next week to follow  up on her hyperthyroidism.   FOLLOW UP:  With Dr. Corinda Gubler in two weeks and the [facility] next  Monday, August 25, 2002.  The office will call with appointments.     Tereso Newcomer, Anders Simmonds, M.D.    SW/MEDQ  D:  08/23/2002  T:  08/23/2002  Job:  [id number]   cc:   [name]  [address]  [place]  [place] [address]  Fax: [phone]   [name], M.D.    cc:   [name]  [address]  [place]  [place] [address]  Fax: [phone]   [name], M.D.

## 2010-07-15 NOTE — H&P (Signed)
NAME:  [name], [name]                           ACCOUNT NO.:  [id number]   MEDICAL RECORD NO.:  [id number]                  PATIENT TYPE:  INP   LOCATION:                                       FACILITY:  [facility]   PHYSICIAN:  Cecil Cranker, M.D.             DATE OF BIRTH:  [birth year]   DATE OF ADMISSION:  08/19/2002  DATE OF DISCHARGE:                                HISTORY & PHYSICAL   CHIEF COMPLAINT:  Atrial fibrillation.   HISTORY OF PRESENT ILLNESS:  This is a very pleasant 70 year old female who  was referred to our office today by Dr. Mia Creek after the patient  was  found to be in atrial fibrillation during a routine office visit  with  Dr. Ananias Pilgrim on August 12, 2002. The patient also has a history of  hypertension. She has been on diuretic therapy. She has a history of  hypothyroidism and recently her TSH was noted to be low at 0.044. Dr.  Ananias Pilgrim instructed the patient to temporarily hold her thyroid supplement.   The patient was referred to cardiology today for further evaluation of her  atrial fibrillation. She converted to normal sinus rhythm and  initially in  the office her EKG showed normal sinus rhythm, rate 77 beats a minute  without any ischemic changes. In talking with the patient, she states that  she was asymptomatic at the time of her atrial fibrillation, although she  does remember having some fluttering at times over the past several months  that would occur often while washing the dishes. She would sit down and rest  and these symptoms would resolve. She also notes that she has had occasional  chest heaviness described as a brick sitting on her chest related to  exertion on several occasions in the past 2 months which have not been  associated with her rapid heart rate. Based on these symptoms, we felt that  the patient would probably need an echocardiogram, Coumadin and possible  stress test versus cardiac catheterization.   Prior to leaving  our office today the patient noted that her heart rate had  increased  once again. We checked an EKG and at that time she was in what  appeared  to be atrial flutter with a rate of approximately  150 beats a  minute. Dr. Corinda Gubler attempted carotid massage as well as Valsalva maneuvers,  however, we were unable to break the rhythm. The patient is to be admitted  to [facility] today  for anticoagulation, rate control and cardiac  catheterization to further evaluate her coronary artery disease.   PAST MEDICAL HISTORY:  1. History of rhinitis.  2. History of hypertension.  3. History of hypothyroidism.  4. History of bronchitis.  5. She is status post child birth x1.  6. Status post tonsillectomy.   ALLERGIES:  No known drug allergies.   CURRENT MEDICATIONS:  1.  Lipitor 20 mg daily.  2. Armour thyroid 120 mg 1/2 tablet b.i.d. As noted this is on hold.  3. Diovan 320 mg daily.  4. Triamterene hydrochlorothiazide 37.5/25 1 b.i.d.  5. Aspirin  81 mg daily.   SOCIAL HISTORY:  The patient is married. She lives in [place]. She has 1  child. 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   REVIEW OF SYSTEMS:  Essentially negative except for the fact that she wears  reading glasses. She had a history of bronchitis as well as see other  history as noted above.   FAMILY HISTORY:  The patient's father died of Alzheimer's disease at age 85.  He had a pacemaker. Her  mother died from stroke at age 8. She also had a  pacemaker. She has a brother who is living who has a pacemaker. She has 3  sisters. One twin sister had a stent performed in 2002. She has a history of  hypertension.   LABORATORY DATA:  An EKG in the office today  initially showed sinus rhythm,  rate 77 beats a minute. A repeat  EKG several minutes later showed atrial  flutter, rate 149 beats a minute with nonspecific  ST changes.   IMPRESSION:  1. Atrial flutter with rapid ventricular response.  2. History of exertional chest  pain.  3. History of hypothyroidism with recent low TSH with thyroid supplement on     hold.  4. Obesity.  5. History of asthmatic bronchitis.  6. History of rhinitis.  7. History of hypertension.  8. History of diuretic therapy.   PLAN:  As noted the plan will be to admit the patient for rate control and  anticoagulation. She will no doubt need Coumadin therapy, but we will check  her thyroid  and resume her supplement with her TSH has normalized. We will  add aspirin 325 mg daily and the plan will be for cardiac catheterization  tomorrow if stable.     Delton See, P.A. [facility]                  Cecil Cranker, M.D.    DR/MEDQ  D:  08/19/2002  T:  08/19/2002  Job:  [id number]   cc:   [name]  [address]  [place]  [place] [address]  Fax: [phone]    cc:   [name]  [address]  [place]  [place] [address]  Fax: [phone]

## 2010-07-15 NOTE — Op Note (Signed)
NAME:  Dembeck, [name]               ACCOUNT NO.:  [id number]   MEDICAL RECORD NO.:  [id number]          PATIENT TYPE:  AMB   LOCATION:  DAY                          FACILITY:  [facility]   PHYSICIAN:  Ronald A. Gioffre, M.D.DATE OF BIRTH:  September 01, 1940   DATE OF PROCEDURE:  04/29/2004  DATE OF DISCHARGE:                                 OPERATIVE REPORT   SURGEON:  Georges Lynch. Darrelyn Hillock, M.D.   ASSISTANT:  Ebbie Ridge. Paitsel, P.A.   PREOPERATIVE DIAGNOSES:  1.  Frozen shoulder on the left.  2.  Torn rotator cuff tendon on the left.   POSTOPERATIVE DIAGNOSES:  1.  Frozen shoulder on the left.  2.  Torn rotator cuff tendon on the left.   OPERATION:  1.  Closed manipulation of a frozen shoulder on the left.  2.  Open decompression by performing an acromionectomy left shoulder.  3.  TissueMend graft to the torn rotator cuff left shoulder.   DESCRIPTION OF PROCEDURE:  Under general anesthesia, routine orthopedic prep  and draping of the left upper extremity was carried out. Note prior to the  prep, I did a closed manipulation, she had a severe frozen shoulder.  Following this, I detached the deltoid tendon by sharp dissection, I  identified the acromion, it was very wide, thickened down sloped acromion. I  protected the rotator cuff with a Bennett retractor, utilized the  oscillating saw and did an open partial acromionectomy. I then utilized the  bur to even out the undersurface of the acromion.  Once we reestablished the  subacromial space, I excised the subdeltoid bursa and I noted that the  rotator cuff was torn distally and in fact, it was so thinned out that you  could basically see bare bone. So I simply reinforced it with a 3 x 3 double  thickness TissueMend graft. We had a nice repair. I thoroughly irrigated out  the area and then reapproximated the deltoid tendon in the usual fashion.  The subcu was closed with #0 Vicryl, skin with metal staples and a sterile  Neosporin dressing was  applied. She was placed in a shoulder immobilizer.      RAG/MEDQ  D:  04/29/2004  T:  04/29/2004  Job:  [id number]

## 2010-07-15 NOTE — Cardiovascular Report (Signed)
NAME:  [name], [name]                           ACCOUNT NO.:  [id number]   MEDICAL RECORD NO.:  [id number]                   PATIENT TYPE:  INP   LOCATION:  4733                                 FACILITY:  [facility]   PHYSICIAN:  Veneda Melter, M.D.                   DATE OF BIRTH:  [birth year]   DATE OF PROCEDURE:  08/20/2992  DATE OF DISCHARGE:                              CARDIAC CATHETERIZATION   PROCEDURES PERFORMED:  1. Left heart catheterization.  2. Left ventriculogram.  3. Selective coronary angiography.  4. Perclose, right femoral artery.   DIAGNOSES:  1. Trivial coronary artery disease.  2. Normal left ventricular systolic function.   CARDIOLOGIST:  Veneda Melter, M.D.   HISTORY:  [name] is a 70 year old female who presents with atrial  fibrillation after iatrogenic hyperthyroidism due to oral supplementation.  The patient reverted to normal sinus rhythm.  She had had intermittent  episodes of substernal chest pressure prompting admission to the hospital.  She ruled out for acute myocardial infarction and has been stable medically.  She is referred for further cardiac assessment.   TECHNIQUE:  Informed consent was obtained.  The patient was brought to the  catheterization lab.  A 6 French sheath was placed in the right femoral  artery using the modified Seldinger technique.  A 6 [place] JL-4 and a  special right catheter were used to engage the left and right coronary  arteries, and selective angiography was performed in various projections  using manual injection of contrast.  A 6 French pigtail catheter was then  advanced in the left ventricle and a left ventriculogram performed using  power injection of contrast.  At the termination of the case catheters and  sheaths were removed and a Perclose suture closure deployed to the right  femoral artery until adequate hemostasis was achieved.   The patient tolerated the procedure well and was transferred to the floor in  stable condition.   FINDINGS:  The findings are as follows.   1. Left Main Trunk:  The left main trunk is a large caliber vessel with mild     irregularities.   1. LAD:  The LAD is a medium caliber vessel that provides a trivial first     diagonal branch in the proximal segment as well as a large first septal     arcade.  The LAD has luminal irregularities.   1. Left Circumflex Artery:  The left circumflex artery is a medium caliber     vessel that provides two marginal branches in the midsection.  The left     circumflex system has luminal disease.   1. Right Coronary Artery:  The right coronary artery is dominant and a     medium caliber vessel that provides a small posterior descending artery     and two posterior ventricular branches at its terminal segment.  The  right coronary artery has luminal disease.   1. LV:  The LV has normal end-systolic and end-diastolic dimensions.     Overall left ventricular function is well-preserved.  The ejection     fraction is greater 65%.  No mitral regurgitation.  LV pressure is     140/10, aortic is 140/75 and LVEDP equals 30.   ASSESSMENT/PLAN:  [name] is a 70 year old female with trivial coronary  artery disease and well-preserved left ventricular function.  Echocardiogram  shows normal left ventricular function as well with no significant valvular  abnormalities.  She has normal left atrial size.  The patient has atrial  fibrillation in the setting of over supplementation of her hypothyroidism.   The patient will be anticoagulated with Coumadin for approximately three  months.  Should the patient have no recurrence of atrial fibrillation this  may then be discontinued.                                                 Veneda Melter, M.D.    NG/MEDQ  D:  08/21/2002  T:  08/23/2002  Job:  [id number]  [name]  [address]  [place]  [place] [address]  Fax: [phone]   [name], M.D.   cc:   [name]  [address]  [place]  [place] [address]  Fax: [phone]   [name], M.D.

## 2010-07-23 ENCOUNTER — Encounter: Payer: Self-pay | Admitting: Internal Medicine

## 2010-07-29 ENCOUNTER — Ambulatory Visit (INDEPENDENT_AMBULATORY_CARE_PROVIDER_SITE_OTHER): Payer: Medicare Other | Admitting: *Deleted

## 2010-07-29 DIAGNOSIS — I4891 Unspecified atrial fibrillation: Secondary | ICD-10-CM

## 2010-07-29 LAB — POCT INR: INR: 2.6

## 2010-08-05 ENCOUNTER — Encounter: Payer: Self-pay | Admitting: Internal Medicine

## 2010-08-05 ENCOUNTER — Ambulatory Visit (INDEPENDENT_AMBULATORY_CARE_PROVIDER_SITE_OTHER): Payer: Medicare Other | Admitting: Internal Medicine

## 2010-08-05 DIAGNOSIS — I495 Sick sinus syndrome: Secondary | ICD-10-CM

## 2010-08-05 DIAGNOSIS — I10 Essential (primary) hypertension: Secondary | ICD-10-CM

## 2010-08-05 DIAGNOSIS — Z950 Presence of cardiac pacemaker: Secondary | ICD-10-CM

## 2010-08-05 NOTE — Patient Instructions (Signed)
Your physician wants you to follow-up in: 12 months with Dr. Taylor. You will receive a reminder letter in the mail two months in advance. If you don't receive a letter, please call our office to schedule the follow-up appointment.    

## 2010-08-06 ENCOUNTER — Other Ambulatory Visit: Payer: Self-pay | Admitting: Cardiology

## 2010-08-07 ENCOUNTER — Encounter: Payer: Self-pay | Admitting: Internal Medicine

## 2010-08-07 NOTE — Progress Notes (Signed)
HPI [name] returns today for followup. She is a pleasant 70 yo woman with a h/o symptomatic bradycardia, Atrial fibrillation, HTN, obesity and s/p PPM. She denies palpitations or syncope. Class 2 dyspnea which is multi-factorial. She has had trouble losing weight. No chest pain. Minimal peripheral edema. No Known Allergies   Current Outpatient Prescriptions  Medication Sig Dispense Refill  . Acetaminophen-Codeine (TYLENOL WITH CODEINE #3 PO) Take by mouth as needed.        Marland Kitchen L-Methylfolate-B6-B12 (METANX PO) Take by mouth daily.        Marland Kitchen leflunomide (ARAVA) 20 MG tablet Take 20 mg by mouth daily.        Marland Kitchen LEVOTHYROXINE SODIUM PO Take by mouth daily.        . NON FORMULARY Biest 5mg /ml (70-30) Cream at night       . potassium chloride (KLOR-CON) 10 MEQ CR tablet Take 10 mEq by mouth daily.        . progesterone (PROMETRIUM) 100 MG capsule Take 100 mg by mouth daily.        . valsartan-hydrochlorothiazide (DIOVAN-HCT) 160-25 MG per tablet Take 1 tablet by mouth daily.        Marland Kitchen warfarin (COUMADIN) 5 MG tablet USE AS DIRECTED BY ANTICOAGULATION CLINIC  30 tablet  3  . metoprolol (LOPRESSOR) 50 MG tablet TAKE 1 AND 1/2 TABLETS BY MOUTH TWICE A DAY  270 tablet  2     Past Medical History  Diagnosis Date  . Hypothyroidism   . Hyperlipidemia   . Hypertension   . Paroxysmal atrial fibrillation   . Obesity   . Sick sinus syndrome   . Aortic insufficiency     Trivial  . Mitral regurgitation   . Bronchitis   . Rhinitis     ROS:   All systems reviewed and negative except as noted in the HPI.   Past Surgical History  Procedure Date  . Tonsillectomy   . Pacemaker insertion     Status post insertion  . Status post child birth x1      Family History  Problem Relation Age of Onset  . Stroke Mother   . Alzheimer's disease Father   . Hypertension Sister      History   Social History  . Marital Status: Married    Spouse Name: N/A    Number of Children: N/A  . Years  of Education: N/A   Occupational History  . Data entry clerk    Social History Main Topics  . Smoking status: Never Smoker   . Smokeless tobacco: Not on file  . Alcohol Use: No  . Drug Use: Not on file  . Sexually Active: Not on file   Other Topics Concern  . Not on file   Social History Narrative  . No narrative on file     BP 144/98  Pulse 74  Resp 16  Ht 5\' 2"  (1.575 m)  Wt 291 lb (131.997 kg)  BMI 53.22 kg/m2  Physical Exam:  Well appearing NAD HEENT: Unremarkable Neck:  No JVD, no thyromegally Lymphatics:  No adenopathy Back:  No CVA tenderness Lungs:  Clear with no wheezes. Well healed PPM incision. HEART:  Regular rate rhythm, no murmurs, no rubs, no clicks Abd:  Flat, positive bowel sounds, no organomegally, no rebound, no guarding Ext:  2 plus pulses, no edema, no cyanosis, no clubbing Skin:  No rashes no nodules Neuro:  CN II through XII intact, motor grossly intact  DEVICE  Normal device function.  See PaceArt for details.   Assess/Plan:

## 2010-08-07 NOTE — Assessment & Plan Note (Signed)
Her blood pressure is mildly elevated. Continue current meds and maintain a low sodium diet. I have have asked her to lose weight.

## 2010-08-07 NOTE — Assessment & Plan Note (Signed)
Her device is working normally. Will recheck in several months. 

## 2010-08-10 ENCOUNTER — Other Ambulatory Visit: Payer: Self-pay | Admitting: *Deleted

## 2010-08-10 MED ORDER — METOPROLOL TARTRATE 50 MG PO TABS: ORAL_TABLET | ORAL | Status: DC

## 2010-08-19 ENCOUNTER — Encounter: Payer: Medicare Other | Admitting: *Deleted

## 2010-08-23 ENCOUNTER — Encounter: Payer: Medicare Other | Admitting: *Deleted

## 2010-08-26 ENCOUNTER — Ambulatory Visit (INDEPENDENT_AMBULATORY_CARE_PROVIDER_SITE_OTHER): Payer: Medicare Other | Admitting: *Deleted

## 2010-08-26 DIAGNOSIS — I4891 Unspecified atrial fibrillation: Secondary | ICD-10-CM

## 2010-09-16 ENCOUNTER — Ambulatory Visit (INDEPENDENT_AMBULATORY_CARE_PROVIDER_SITE_OTHER): Payer: Medicare Other | Admitting: *Deleted

## 2010-09-16 DIAGNOSIS — I4891 Unspecified atrial fibrillation: Secondary | ICD-10-CM

## 2010-10-14 ENCOUNTER — Encounter: Payer: Medicare Other | Admitting: *Deleted

## 2010-10-21 ENCOUNTER — Encounter: Payer: Medicare Other | Admitting: *Deleted

## 2010-10-21 ENCOUNTER — Ambulatory Visit (HOSPITAL_COMMUNITY)
Admission: RE | Admit: 2010-10-21 | Discharge: 2010-10-21 | Disposition: A | Discharge status: Home / Self Care | Payer: Medicare Other | Source: Ambulatory Visit | Attending: Family Medicine | Admitting: Family Medicine

## 2010-10-21 ENCOUNTER — Other Ambulatory Visit (HOSPITAL_COMMUNITY): Payer: Self-pay | Admitting: Family Medicine

## 2010-10-21 ENCOUNTER — Ambulatory Visit (INDEPENDENT_AMBULATORY_CARE_PROVIDER_SITE_OTHER): Payer: Medicare Other | Admitting: *Deleted

## 2010-10-21 DIAGNOSIS — J42 Unspecified chronic bronchitis: Secondary | ICD-10-CM | POA: Insufficient documentation

## 2010-10-21 DIAGNOSIS — R059 Cough, unspecified: Secondary | ICD-10-CM | POA: Insufficient documentation

## 2010-10-21 DIAGNOSIS — R062 Wheezing: Secondary | ICD-10-CM | POA: Insufficient documentation

## 2010-10-21 DIAGNOSIS — I4891 Unspecified atrial fibrillation: Secondary | ICD-10-CM

## 2010-10-21 DIAGNOSIS — R05 Cough: Secondary | ICD-10-CM | POA: Insufficient documentation

## 2010-10-24 ENCOUNTER — Other Ambulatory Visit (HOSPITAL_COMMUNITY): Payer: Medicare Other

## 2010-10-24 ENCOUNTER — Encounter: Payer: Medicare Other | Admitting: *Deleted

## 2010-10-27 ENCOUNTER — Encounter: Payer: Self-pay | Admitting: Critical Care Medicine

## 2010-10-27 ENCOUNTER — Ambulatory Visit (INDEPENDENT_AMBULATORY_CARE_PROVIDER_SITE_OTHER): Payer: Medicare Other | Admitting: Critical Care Medicine

## 2010-10-27 DIAGNOSIS — I495 Sick sinus syndrome: Secondary | ICD-10-CM

## 2010-10-27 DIAGNOSIS — I10 Essential (primary) hypertension: Secondary | ICD-10-CM

## 2010-10-27 DIAGNOSIS — E785 Hyperlipidemia, unspecified: Secondary | ICD-10-CM

## 2010-10-27 DIAGNOSIS — J40 Bronchitis, not specified as acute or chronic: Secondary | ICD-10-CM

## 2010-10-27 DIAGNOSIS — R059 Cough, unspecified: Secondary | ICD-10-CM

## 2010-10-27 DIAGNOSIS — I480 Paroxysmal atrial fibrillation: Secondary | ICD-10-CM

## 2010-10-27 DIAGNOSIS — I4891 Unspecified atrial fibrillation: Secondary | ICD-10-CM

## 2010-10-27 DIAGNOSIS — K219 Gastro-esophageal reflux disease without esophagitis: Secondary | ICD-10-CM | POA: Insufficient documentation

## 2010-10-27 DIAGNOSIS — R05 Cough: Secondary | ICD-10-CM

## 2010-10-27 DIAGNOSIS — E669 Obesity, unspecified: Secondary | ICD-10-CM

## 2010-10-27 DIAGNOSIS — E039 Hypothyroidism, unspecified: Secondary | ICD-10-CM

## 2010-10-27 MED ORDER — FISH OIL OIL: TOPICAL_OIL | Status: DC

## 2010-10-27 MED ORDER — HYDROCOD POLST-CPM POLST ER 10-8 MG PO CP12: 1.0000 | ORAL_CAPSULE | Freq: Two times a day (BID) | ORAL | Status: DC | PRN

## 2010-10-27 MED ORDER — BENZONATATE 100 MG PO CAPS: ORAL_CAPSULE | ORAL | Status: AC

## 2010-10-27 MED ORDER — OMEPRAZOLE 20 MG PO CPDR: 20.0000 mg | DELAYED_RELEASE_CAPSULE | Freq: Every day | ORAL | Status: DC

## 2010-10-27 NOTE — Progress Notes (Signed)
Subjective:    Patient ID: [name], female    DOB: 03-15-1940, 70 y.o.   MRN: [id number] 70 y.o. WF referred for cyclical cough Cough This is a chronic (onset 3months ago, sudden onset) problem. The current episode started more than 1 month ago. The problem has been gradually worsening. The problem occurs hourly (day and night). The cough is non-productive. Associated symptoms include myalgias, shortness of breath and wheezing. Pertinent negatives include no chest pain, chills, ear pain, fever, headaches, heartburn, hemoptysis, nasal congestion, postnasal drip, rash, rhinorrhea or sore throat. Associated symptoms comments: fatigue. The symptoms are aggravated by lying down and exercise (If lay flat stomach hurts and pushing up. gets choked if eats too much, gets regurgitation). Treatments tried: oxygen at night per [employer]. The treatment provided mild relief. Her past medical history is significant for asthma and pneumonia. There is no history of bronchitis, COPD, emphysema or environmental allergies. asthma as a child, pna 1 year ago, hx afib with PPM  Shortness of Breath This is a chronic problem. The current episode started more than 1 month ago. The problem occurs daily (at rest and exertion). The problem has been gradually worsening. Associated symptoms include PND and wheezing. Pertinent negatives include no abdominal pain, chest pain, ear pain, fever, headaches, hemoptysis, leg swelling, neck pain, orthopnea, rash, rhinorrhea, sore throat, sputum production or vomiting. The symptoms are aggravated by any activity, eating, exercise and lying flat. Associated symptoms comments: Has elevated the HOB. Her past medical history is significant for asthma and pneumonia. There is no history of allergies, aspirin allergies, bronchiolitis, CAD, chronic lung disease, COPD, DVT, a heart failure or PE. (Asthma as a child, pna 1 year ago, hx afib with PPM)    Past Medical History  Diagnosis Date  .  Hypothyroidism   . Hyperlipidemia   . Hypertension   . Paroxysmal atrial fibrillation   . Obesity   . Sick sinus syndrome   . Aortic insufficiency     Trivial  . Mitral regurgitation   . Bronchitis   . Rhinitis      Family History  Problem Relation Age of Onset  . Stroke Mother   . Alzheimer's disease Father   . Hypertension Sister   . Heart disease Mother   . Arthritis Sister   . Cancer Brother   . Cancer Maternal Grandfather      History   Social History  . Marital Status: Married    Spouse Name: N/A    Number of Children: 1  . Years of Education: N/A   Occupational History  . Data entry clerk    Social History Main Topics  . Smoking status: Never Smoker   . Smokeless tobacco: Never Used  . Alcohol Use: No  . Drug Use: Not on file  . Sexually Active: Not on file   Other Topics Concern  . Not on file   Social History Narrative  . No narrative on file     Allergies  Allergen Reactions  . Contrast Media (Iodinated Diagnostic Agents)     Contrast dye causes swelling     Outpatient Prescriptions Prior to Visit  Medication Sig Dispense Refill  . Acetaminophen-Codeine (TYLENOL WITH CODEINE #3 PO) Take by mouth as needed.        Marland Kitchen L-Methylfolate-B6-B12 (METANX PO) Take by mouth daily.        . metoprolol (LOPRESSOR) 50 MG tablet Take 1 1/2 tablets twice a day  180 tablet  2  . NON FORMULARY  Biest 5mg /ml (70-30) Cream at night       . potassium chloride (KLOR-CON) 10 MEQ CR tablet Take 10 mEq by mouth daily.        . progesterone (PROMETRIUM) 100 MG capsule 1-2 capsules at bedtime      . valsartan-hydrochlorothiazide (DIOVAN-HCT) 160-25 MG per tablet Take 1 tablet by mouth daily.        Marland Kitchen warfarin (COUMADIN) 5 MG tablet USE AS DIRECTED BY ANTICOAGULATION CLINIC  30 tablet  3  . leflunomide (ARAVA) 20 MG tablet Take 20 mg by mouth daily.        Marland Kitchen LEVOTHYROXINE SODIUM PO Take by mouth daily.            Review of Systems  Constitutional: Positive for  diaphoresis, fatigue and unexpected weight change. Negative for fever, chills, activity change and appetite change.  HENT: Positive for sinus pressure. Negative for hearing loss, ear pain, nosebleeds, congestion, sore throat, facial swelling, rhinorrhea, sneezing, mouth sores, trouble swallowing, neck pain, neck stiffness, dental problem, voice change, postnasal drip, tinnitus and ear discharge.   Eyes: Negative for photophobia, discharge, itching and visual disturbance.  Respiratory: Positive for cough, chest tightness, shortness of breath and wheezing. Negative for apnea, hemoptysis, sputum production, choking and stridor.   Cardiovascular: Positive for PND. Negative for chest pain, palpitations, orthopnea and leg swelling.  Gastrointestinal: Negative for heartburn, nausea, vomiting, abdominal pain, constipation, blood in stool and abdominal distention.  Genitourinary: Negative for dysuria, urgency, frequency, hematuria, flank pain, decreased urine volume and difficulty urinating.  Musculoskeletal: Positive for myalgias and arthralgias. Negative for back pain, joint swelling and gait problem.  Skin: Positive for pallor. Negative for color change and rash.  Neurological: Positive for weakness. Negative for dizziness, tremors, seizures, syncope, speech difficulty, light-headedness, numbness and headaches.  Hematological: Negative for environmental allergies and adenopathy. Does not bruise/bleed easily.  Psychiatric/Behavioral: Positive for agitation. Negative for confusion and sleep disturbance. The patient is nervous/anxious.        Objective:   Physical Exam Filed Vitals:   10/27/10 1008  BP: 130/84  Pulse: 71  Temp: 98.4 F (36.9 C)  TempSrc: Oral  Height: 5\' 3"  (1.6 m)  Weight: 286 lb (129.729 kg)  SpO2: 97%    Gen: Pleasant, obese , in no distress,  normal affect  ENT: No lesions,  mouth clear,  oropharynx clear, no postnasal drip  Neck: No JVD, no TMG, no carotid  bruits  Lungs: No use of accessory muscles, no dullness to percussion, clear without rales or rhonchi  Cardiovascular: RRR, heart sounds normal, no murmur or gallops, no peripheral edema  Abdomen: soft and NT, no HSM,  BS normal  Musculoskeletal: No deformities, no cyanosis or clubbing  Neuro: alert, non focal  Skin: Warm, no lesions or rashes  Ct chest /CXR 2012: large hiatal hernia, no active disease, atx at bases   Recent Labs: K 3.6 Dr 1.06  WBC 4.9 Hgb 13     Assessment & Plan:   GERD (gastroesophageal reflux disease) Hiatal hernia with high level reflux and microaspiration With associated cyclical cough No primary lung disease 8/12 spiro normal Obesity playing a role  Plan Cyclic cough protocol with tussicaps/tessalon perles Weight loss Omeprazole daily Reflux diet   Cough See gerd assessment    Updated Medication List Outpatient Encounter Prescriptions as of 10/27/2010  Medication Sig Dispense Refill  . Acetaminophen-Codeine (TYLENOL WITH CODEINE #3 PO) Take by mouth as needed.        Marland Kitchen  cholecalciferol (VITAMIN D) 1000 UNITS tablet Take 1,000 Units by mouth daily.        . Fish Oil OIL HOLD      . L-Methylfolate-B6-B12 (METANX PO) Take by mouth daily.        Marland Kitchen liothyronine (CYTOMEL) 5 MCG tablet Take 5 mcg by mouth 2 (two) times daily.        . metoprolol (LOPRESSOR) 50 MG tablet Take 1 1/2 tablets twice a day  180 tablet  2  . Multiple Vitamin (MULTIVITAMIN PO) Take 1 tablet by mouth daily.        . NON FORMULARY Biest 5mg /ml (70-30) Cream at night       . potassium chloride (KLOR-CON) 10 MEQ CR tablet Take 10 mEq by mouth daily.        . progesterone (PROMETRIUM) 100 MG capsule 1-2 capsules at bedtime      . valsartan-hydrochlorothiazide (DIOVAN-HCT) 160-25 MG per tablet Take 1 tablet by mouth daily.        . vitamin C (ASCORBIC ACID) 500 MG tablet Take 500 mg by mouth daily.        Marland Kitchen warfarin (COUMADIN) 5 MG tablet USE AS DIRECTED BY ANTICOAGULATION  CLINIC  30 tablet  3  . DISCONTD: Fish Oil OIL by Does not apply route. 1 tsp daily       . benzonatate (TESSALON) 100 MG capsule Take 1-2 every 4 hours as directed by the cough protocol  90 capsule  4  . Hydrocod Polst-Chlorphen Polst (TUSSICAPS) 10-8 MG CP12 Take 1 capsule by mouth 2 (two) times daily as needed.  10 each  0  . omeprazole (PRILOSEC) 20 MG capsule Take 1 capsule (20 mg total) by mouth daily.  30 capsule  6  . DISCONTD: leflunomide (ARAVA) 20 MG tablet Take 20 mg by mouth daily.        Marland Kitchen DISCONTD: LEVOTHYROXINE SODIUM PO Take by mouth daily.

## 2010-10-27 NOTE — Assessment & Plan Note (Signed)
See gerd assessment

## 2010-10-27 NOTE — Patient Instructions (Signed)
Hold fish oil Focus on weight loss Start cyclic cough protocol Follow reflux diet Start omeprazole one daily  Return 2 months

## 2010-10-27 NOTE — Assessment & Plan Note (Signed)
Hiatal hernia with high level reflux and microaspiration With associated cyclical cough No primary lung disease 8/12 spiro normal Obesity playing a role  Plan Cyclic cough protocol with tussicaps/tessalon perles Weight loss Omeprazole daily Reflux diet

## 2010-11-04 ENCOUNTER — Encounter (INDEPENDENT_AMBULATORY_CARE_PROVIDER_SITE_OTHER): Payer: Self-pay | Admitting: Surgery

## 2010-11-04 ENCOUNTER — Encounter: Payer: Self-pay | Admitting: Internal Medicine

## 2010-11-04 DIAGNOSIS — I495 Sick sinus syndrome: Secondary | ICD-10-CM

## 2010-11-11 ENCOUNTER — Ambulatory Visit (INDEPENDENT_AMBULATORY_CARE_PROVIDER_SITE_OTHER): Payer: Medicare Other | Admitting: *Deleted

## 2010-11-11 DIAGNOSIS — I4891 Unspecified atrial fibrillation: Secondary | ICD-10-CM

## 2010-11-11 LAB — POCT INR: INR: 2

## 2010-11-21 LAB — PROTIME-INR
INR: 2.3 — ABNORMAL HIGH
Prothrombin Time: 20.3 — ABNORMAL HIGH
Prothrombin Time: 26 — ABNORMAL HIGH

## 2010-12-02 ENCOUNTER — Ambulatory Visit (INDEPENDENT_AMBULATORY_CARE_PROVIDER_SITE_OTHER): Payer: Medicare Other | Admitting: *Deleted

## 2010-12-02 DIAGNOSIS — I4891 Unspecified atrial fibrillation: Secondary | ICD-10-CM

## 2010-12-23 ENCOUNTER — Encounter: Payer: Medicare Other | Admitting: *Deleted

## 2010-12-26 ENCOUNTER — Ambulatory Visit: Payer: Medicare Other | Admitting: Critical Care Medicine

## 2010-12-30 ENCOUNTER — Ambulatory Visit: Payer: Medicare Other | Admitting: Critical Care Medicine

## 2011-01-04 ENCOUNTER — Other Ambulatory Visit: Payer: Self-pay | Admitting: *Deleted

## 2011-01-04 MED ORDER — WARFARIN SODIUM 5 MG PO TABS: 5.0000 mg | ORAL_TABLET | ORAL | Status: DC

## 2011-01-10 ENCOUNTER — Ambulatory Visit (INDEPENDENT_AMBULATORY_CARE_PROVIDER_SITE_OTHER): Payer: Medicare Other | Admitting: *Deleted

## 2011-01-10 ENCOUNTER — Other Ambulatory Visit: Payer: Self-pay | Admitting: *Deleted

## 2011-01-10 DIAGNOSIS — I4891 Unspecified atrial fibrillation: Secondary | ICD-10-CM

## 2011-01-10 LAB — POCT INR: INR: 3.4

## 2011-01-13 ENCOUNTER — Encounter: Payer: Medicare Other | Admitting: *Deleted

## 2011-02-03 ENCOUNTER — Encounter: Payer: Medicare Other | Admitting: *Deleted

## 2011-02-03 ENCOUNTER — Encounter: Payer: Self-pay | Admitting: Internal Medicine

## 2011-02-03 DIAGNOSIS — I495 Sick sinus syndrome: Secondary | ICD-10-CM

## 2011-02-10 ENCOUNTER — Ambulatory Visit (INDEPENDENT_AMBULATORY_CARE_PROVIDER_SITE_OTHER): Payer: Medicare Other | Admitting: *Deleted

## 2011-02-10 DIAGNOSIS — I4891 Unspecified atrial fibrillation: Secondary | ICD-10-CM

## 2011-03-10 ENCOUNTER — Ambulatory Visit (INDEPENDENT_AMBULATORY_CARE_PROVIDER_SITE_OTHER): Payer: Medicare Other | Admitting: *Deleted

## 2011-03-10 DIAGNOSIS — I4891 Unspecified atrial fibrillation: Secondary | ICD-10-CM | POA: Diagnosis not present

## 2011-04-07 ENCOUNTER — Ambulatory Visit (INDEPENDENT_AMBULATORY_CARE_PROVIDER_SITE_OTHER): Payer: Medicare Other | Admitting: *Deleted

## 2011-04-07 DIAGNOSIS — I4891 Unspecified atrial fibrillation: Secondary | ICD-10-CM | POA: Diagnosis not present

## 2011-04-07 LAB — POCT INR: INR: 3.1

## 2011-04-11 DIAGNOSIS — I4891 Unspecified atrial fibrillation: Secondary | ICD-10-CM | POA: Diagnosis not present

## 2011-04-11 DIAGNOSIS — D6859 Other primary thrombophilia: Secondary | ICD-10-CM | POA: Diagnosis not present

## 2011-04-28 ENCOUNTER — Other Ambulatory Visit: Payer: Self-pay | Admitting: Cardiology

## 2011-04-28 ENCOUNTER — Other Ambulatory Visit: Payer: Self-pay

## 2011-04-28 MED ORDER — METOPROLOL TARTRATE 50 MG PO TABS: ORAL_TABLET | ORAL | Status: DC

## 2011-05-02 DIAGNOSIS — D6859 Other primary thrombophilia: Secondary | ICD-10-CM | POA: Diagnosis not present

## 2011-05-02 DIAGNOSIS — I4891 Unspecified atrial fibrillation: Secondary | ICD-10-CM | POA: Diagnosis not present

## 2011-05-05 ENCOUNTER — Encounter: Payer: Self-pay | Admitting: Internal Medicine

## 2011-05-05 DIAGNOSIS — I495 Sick sinus syndrome: Secondary | ICD-10-CM | POA: Diagnosis not present

## 2011-05-23 ENCOUNTER — Telehealth: Payer: Self-pay | Admitting: Pharmacist

## 2011-05-23 NOTE — Telephone Encounter (Signed)
Pt called to report that she is now self testing her INRs at home.  Her PCP wrote the order to get the monitor for her so explained to patient they would be in charge of dosing.  She will call their office with the results.  If PCP will not manage INRs, pt is willing to come back to the [facility].

## 2011-05-30 DIAGNOSIS — I4891 Unspecified atrial fibrillation: Secondary | ICD-10-CM | POA: Diagnosis not present

## 2011-05-30 DIAGNOSIS — D6859 Other primary thrombophilia: Secondary | ICD-10-CM | POA: Diagnosis not present

## 2011-06-24 ENCOUNTER — Other Ambulatory Visit: Payer: Self-pay | Admitting: Cardiology

## 2011-06-27 DIAGNOSIS — I4891 Unspecified atrial fibrillation: Secondary | ICD-10-CM | POA: Diagnosis not present

## 2011-06-27 DIAGNOSIS — D6859 Other primary thrombophilia: Secondary | ICD-10-CM | POA: Diagnosis not present

## 2011-07-25 DIAGNOSIS — I4891 Unspecified atrial fibrillation: Secondary | ICD-10-CM | POA: Diagnosis not present

## 2011-07-25 DIAGNOSIS — D6859 Other primary thrombophilia: Secondary | ICD-10-CM | POA: Diagnosis not present

## 2011-07-27 ENCOUNTER — Ambulatory Visit (INDEPENDENT_AMBULATORY_CARE_PROVIDER_SITE_OTHER): Payer: Medicare Other | Admitting: Internal Medicine

## 2011-07-27 ENCOUNTER — Encounter: Payer: Self-pay | Admitting: Internal Medicine

## 2011-07-27 VITALS — BP 130/90 | HR 82 | Ht 62.0 in | Wt 128.0 lb

## 2011-07-27 DIAGNOSIS — I4891 Unspecified atrial fibrillation: Secondary | ICD-10-CM

## 2011-07-27 NOTE — Patient Instructions (Signed)
Your physician wants you to follow-up in: JAN 2014You will receive a reminder letter in the mail two months in advance. If you don't receive a letter, please call our office to schedule the follow-up appointment.  

## 2011-07-27 NOTE — Progress Notes (Signed)
HPI Patient is a 71 year old with a history of bradycardia (s/P PPM), afib, HTN, obesity, SOB  Followed by Rosette Reveal.  Feeling pretty good.  Does feel tired. Activity is limited by bursitis No dizziness. No CP  No palpitations.  Breathing is stable. Would like to lose wt. BP at home with wrist cuff 110/70  Allergies  Allergen Reactions  . Contrast Media (Iodinated Diagnostic Agents)     Contrast dye causes swelling    Current Outpatient Prescriptions  Medication Sig Dispense Refill  . Acetaminophen-Codeine (TYLENOL WITH CODEINE #3 PO) Take by mouth as needed.        . colesevelam (WELCHOL) 625 MG tablet Take two tabs twice a day      . L-Methylfolate-B6-B12 (METANX PO) Take by mouth daily.        Marland Kitchen liothyronine (CYTOMEL) 5 MCG tablet Take 5 mcg by mouth 2 (two) times daily.        . metoprolol (LOPRESSOR) 50 MG tablet Take 1 1/2 tablets twice a day  180 tablet  2  . NON FORMULARY Biest 5mg /ml (70-30) Cream at night       . potassium chloride (KLOR-CON) 10 MEQ CR tablet Take 10 mEq by mouth daily.        . progesterone (PROMETRIUM) 100 MG capsule 1-2 capsules at bedtime      . triamterene-hydrochlorothiazide (MAXZIDE) 75-50 MG per tablet Take 1 tablet by mouth daily.      Marland Kitchen warfarin (COUMADIN) 5 MG tablet Take 1 tablet (5 mg total) by mouth as directed.  30 tablet  3    Past Medical History  Diagnosis Date  . Hypothyroidism   . Hyperlipidemia   . Hypertension   . Paroxysmal atrial fibrillation   . Obesity   . Sick sinus syndrome   . Aortic insufficiency     Trivial  . Mitral regurgitation   . Bronchitis   . Rhinitis     Past Surgical History  Procedure Date  . Tonsillectomy   . Pacemaker insertion     Status post insertion  . Status post child birth x1   . Rotator cuff repair     left    Family History  Problem Relation Age of Onset  . Stroke Mother   . Alzheimer's disease Father   . Hypertension Sister   . Heart disease Mother   . Arthritis Sister   .  Cancer Brother   . Cancer Maternal Grandfather     History   Social History  . Marital Status: Married    Spouse Name: N/A    Number of Children: 1  . Years of Education: N/A   Occupational History  . Data entry clerk    Social History Main Topics  . Smoking status: Never Smoker   . Smokeless tobacco: Never Used  . Alcohol Use: No  . Drug Use: Not on file  . Sexually Active: Not on file   Other Topics Concern  . Not on file   Social History Narrative  . No narrative on file    Review of Systems:  All systems reviewed.  They are negative to the above problem except as previously stated.  Vital Signs: BP 130/90  Pulse 82  Ht 5\' 2"  (1.575 m)  Wt 128 lb (58.06 kg)  BMI 23.41 kg/m2  Physical Exam  Patient is an obese 71 year old in NAD HEENT:  Normocephalic, atraumatic. EOMI, PERRLA.  Neck: JVP is normal. No thyromegaly. No bruits.  Lungs: clear to auscultation. No rales no wheezes.  Heart: Irregular rate and rhythm. Normal S1, S2. No S3.   No significant murmurs. PMI not displaced.  Abdomen:  Supple, nontender. Normal bowel sounds. No masses. No hepatomegaly.  Extremities:   Good distal pulses throughout. No lower extremity edema.  Musculoskeletal :moving all extremities.  Neuro:   alert and oriented x3.  CN II-XII grossly intact.  EKG:  Afib.  82 bpm. Assessment and Plan:  1.  HTN  BP is better at home by their report.  I would recomm that they bring in cuff to next visity.  2.  Afib. Rates controlled.  Continue coumadin.  Checks at home weekly INR.  Forwards to primary MD Dr. Orvan Falconer  3.  S/p PPM for bradycardia.  To see Rosette Reveal in June.  4.  HCM  Will check on fasting lipids from Dr. Blair Dolphin office  Encouraged her to try to lose wt.  Consider wt watchers.  Will f/u in winter.

## 2011-08-01 ENCOUNTER — Ambulatory Visit: Payer: Self-pay | Admitting: Cardiovascular Disease

## 2011-08-01 DIAGNOSIS — I4891 Unspecified atrial fibrillation: Secondary | ICD-10-CM

## 2011-08-04 DIAGNOSIS — I495 Sick sinus syndrome: Secondary | ICD-10-CM | POA: Diagnosis not present

## 2011-08-22 DIAGNOSIS — I4891 Unspecified atrial fibrillation: Secondary | ICD-10-CM | POA: Diagnosis not present

## 2011-08-22 DIAGNOSIS — D6859 Other primary thrombophilia: Secondary | ICD-10-CM | POA: Diagnosis not present

## 2011-08-25 NOTE — Progress Notes (Signed)
Called patient in follow up. She will have a fasting Lipid panel in the next week or two with Dr.James Orvan Falconer from [place]. I provided her with our fax number to forward labs to Dr.Ross.

## 2011-09-04 ENCOUNTER — Other Ambulatory Visit: Payer: Self-pay | Admitting: Critical Care Medicine

## 2011-09-19 DIAGNOSIS — D6859 Other primary thrombophilia: Secondary | ICD-10-CM | POA: Diagnosis not present

## 2011-09-19 DIAGNOSIS — I4891 Unspecified atrial fibrillation: Secondary | ICD-10-CM | POA: Diagnosis not present

## 2011-09-22 ENCOUNTER — Encounter: Payer: Self-pay | Admitting: Internal Medicine

## 2011-09-22 DIAGNOSIS — E559 Vitamin D deficiency, unspecified: Secondary | ICD-10-CM | POA: Diagnosis not present

## 2011-09-22 DIAGNOSIS — R7982 Elevated C-reactive protein (CRP): Secondary | ICD-10-CM | POA: Diagnosis not present

## 2011-09-22 DIAGNOSIS — D509 Iron deficiency anemia, unspecified: Secondary | ICD-10-CM | POA: Diagnosis not present

## 2011-09-22 DIAGNOSIS — R6889 Other general symptoms and signs: Secondary | ICD-10-CM | POA: Diagnosis not present

## 2011-09-22 DIAGNOSIS — R5383 Other fatigue: Secondary | ICD-10-CM | POA: Diagnosis not present

## 2011-09-22 DIAGNOSIS — E039 Hypothyroidism, unspecified: Secondary | ICD-10-CM | POA: Diagnosis not present

## 2011-10-06 ENCOUNTER — Ambulatory Visit (INDEPENDENT_AMBULATORY_CARE_PROVIDER_SITE_OTHER): Payer: Medicare Other | Admitting: Internal Medicine

## 2011-10-06 ENCOUNTER — Encounter: Payer: Self-pay | Admitting: Internal Medicine

## 2011-10-06 VITALS — BP 144/94 | HR 83 | Ht 63.0 in | Wt 278.0 lb

## 2011-10-06 DIAGNOSIS — I495 Sick sinus syndrome: Secondary | ICD-10-CM | POA: Diagnosis not present

## 2011-10-06 DIAGNOSIS — Z950 Presence of cardiac pacemaker: Secondary | ICD-10-CM

## 2011-10-06 DIAGNOSIS — I10 Essential (primary) hypertension: Secondary | ICD-10-CM | POA: Diagnosis not present

## 2011-10-06 LAB — PACEMAKER DEVICE OBSERVATION
AL AMPLITUDE: 1.6 mv
BATTERY VOLTAGE: 2.78 V
BRDY-0002RV: 60 {beats}/min
BRDY-0003RV: 110 {beats}/min
BRDY-0004RV: 120 {beats}/min
DEVICE MODEL PM: 2056081

## 2011-10-06 NOTE — Assessment & Plan Note (Signed)
Her blood pressure is elevated. We discussed the importance of a low-sodium diet, not missing any of her medications, and weight loss.

## 2011-10-06 NOTE — Patient Instructions (Signed)
Your physician wants you to follow-up in: 12 months with Dr. Taylor. You will receive a reminder letter in the mail two months in advance. If you don't receive a letter, please call our office to schedule the follow-up appointment.    

## 2011-10-06 NOTE — Assessment & Plan Note (Signed)
Her device is working normally. We'll plan to recheck in several months. 

## 2011-10-06 NOTE — Progress Notes (Signed)
HPI [name] returns today for followup. She is a very pleasant 71 year old woman with a history of hypertension, symptomatic bradycardia, chronic atrial fibrillation, status post permanent pacemaker insertion. She remains morbidly obese. The patient notes occasional mild peripheral edema. She denies chest pain or shortness of breath. No recent falls. She does have arthritis. She has minimal palpitations. Allergies  Allergen Reactions  . Contrast Media (Iodinated Diagnostic Agents)     Contrast dye causes swelling     Current Outpatient Prescriptions  Medication Sig Dispense Refill  . Acetaminophen-Codeine (TYLENOL WITH CODEINE #3 PO) Take by mouth as needed.        . colesevelam (WELCHOL) 625 MG tablet Take two tabs twice a day      . L-Methylfolate-B6-B12 (METANX PO) Take by mouth daily.        Marland Kitchen liothyronine (CYTOMEL) 5 MCG tablet Take 5 mcg by mouth 2 (two) times daily.        . metoprolol (LOPRESSOR) 50 MG tablet Take 1 1/2 tablets twice a day  180 tablet  2  . NON FORMULARY Biest 5mg /ml (70-30) Cream at night       . omeprazole (PRILOSEC) 20 MG capsule TAKE 1 CAPSULE (20 MG TOTAL) BY MOUTH DAILY.  30 capsule  6  . potassium chloride (KLOR-CON) 10 MEQ CR tablet Take 10 mEq by mouth daily.        . progesterone (PROMETRIUM) 100 MG capsule 1-2 capsules at bedtime      . triamterene-hydrochlorothiazide (MAXZIDE) 75-50 MG per tablet Take 1 tablet by mouth daily.      Marland Kitchen warfarin (COUMADIN) 5 MG tablet Take 1 tablet (5 mg total) by mouth as directed.  30 tablet  3     Past Medical History  Diagnosis Date  . Hypothyroidism   . Hyperlipidemia   . Hypertension   . Paroxysmal atrial fibrillation   . Obesity   . Sick sinus syndrome   . Aortic insufficiency     Trivial  . Mitral regurgitation   . Bronchitis   . Rhinitis     ROS:   All systems reviewed and negative except as noted in the HPI.   Past Surgical History  Procedure Date  . Tonsillectomy   . Pacemaker  insertion     Status post insertion  . Status post child birth x1   . Rotator cuff repair     left     Family History  Problem Relation Age of Onset  . Stroke Mother   . Alzheimer's disease Father   . Hypertension Sister   . Heart disease Mother   . Arthritis Sister   . Cancer Brother   . Cancer Maternal Grandfather      History   Social History  . Marital Status: Married    Spouse Name: N/A    Number of Children: 1  . Years of Education: N/A   Occupational History  . Data entry clerk    Social History Main Topics  . Smoking status: Never Smoker   . Smokeless tobacco: Never Used  . Alcohol Use: No  . Drug Use: Not on file  . Sexually Active: Not on file   Other Topics Concern  . Not on file   Social History Narrative  . No narrative on file     BP 144/94  Pulse 83  Ht 5\' 3"  (1.6 m)  Wt 278 lb (126.1 kg)  BMI 49.25 kg/m2  Physical Exam:  Morbidly obese appearing 71 year old woman,  NAD HEENT: Unremarkable Neck:  No JVD, no thyromegally Lungs:  Clear with no wheezes, rales, or rhonchi. Well-healed pacemaker incision. HEART:  Regular rate rhythm, no murmurs, no rubs, no clicks Abd:  soft, obese, positive bowel sounds, no organomegally, no rebound, no guarding Ext:  2 plus pulses, trace edema, no cyanosis, no clubbing Skin:  No rashes no nodules Neuro:  CN II through XII intact, motor grossly intact  DEVICE  Normal device function.  See PaceArt for details.   Assess/Plan:

## 2011-10-17 DIAGNOSIS — D6859 Other primary thrombophilia: Secondary | ICD-10-CM | POA: Diagnosis not present

## 2011-10-17 DIAGNOSIS — I4891 Unspecified atrial fibrillation: Secondary | ICD-10-CM | POA: Diagnosis not present

## 2011-10-21 ENCOUNTER — Other Ambulatory Visit: Payer: Self-pay | Admitting: Cardiology

## 2011-11-14 DIAGNOSIS — I4891 Unspecified atrial fibrillation: Secondary | ICD-10-CM | POA: Diagnosis not present

## 2011-11-14 DIAGNOSIS — D6859 Other primary thrombophilia: Secondary | ICD-10-CM | POA: Diagnosis not present

## 2011-12-12 DIAGNOSIS — D6859 Other primary thrombophilia: Secondary | ICD-10-CM | POA: Diagnosis not present

## 2011-12-12 DIAGNOSIS — I4891 Unspecified atrial fibrillation: Secondary | ICD-10-CM | POA: Diagnosis not present

## 2011-12-17 ENCOUNTER — Encounter (HOSPITAL_COMMUNITY): Payer: Self-pay | Admitting: Emergency Medicine

## 2011-12-17 ENCOUNTER — Emergency Department (HOSPITAL_COMMUNITY)
Admission: EM | Admit: 2011-12-17 | Discharge: 2011-12-17 | Disposition: A | Discharge status: Home / Self Care | Payer: Medicare Other | Attending: Emergency Medicine | Admitting: Emergency Medicine

## 2011-12-17 DIAGNOSIS — E785 Hyperlipidemia, unspecified: Secondary | ICD-10-CM | POA: Insufficient documentation

## 2011-12-17 DIAGNOSIS — I4891 Unspecified atrial fibrillation: Secondary | ICD-10-CM | POA: Insufficient documentation

## 2011-12-17 DIAGNOSIS — I10 Essential (primary) hypertension: Secondary | ICD-10-CM | POA: Insufficient documentation

## 2011-12-17 DIAGNOSIS — E039 Hypothyroidism, unspecified: Secondary | ICD-10-CM | POA: Diagnosis not present

## 2011-12-17 DIAGNOSIS — Z79899 Other long term (current) drug therapy: Secondary | ICD-10-CM | POA: Insufficient documentation

## 2011-12-17 DIAGNOSIS — Z7901 Long term (current) use of anticoagulants: Secondary | ICD-10-CM | POA: Diagnosis not present

## 2011-12-17 DIAGNOSIS — K118 Other diseases of salivary glands: Secondary | ICD-10-CM | POA: Insufficient documentation

## 2011-12-17 DIAGNOSIS — R221 Localized swelling, mass and lump, neck: Secondary | ICD-10-CM | POA: Diagnosis not present

## 2011-12-17 DIAGNOSIS — R220 Localized swelling, mass and lump, head: Secondary | ICD-10-CM | POA: Diagnosis not present

## 2011-12-17 NOTE — ED Notes (Signed)
Pt reports having swollen gland to R neck after eating breakfast, has had this happen before and normally can take a benadryl and swelling will go away; reports has taken 2 benadryl with no relief

## 2011-12-17 NOTE — ED Notes (Signed)
Pt has HX of EENT problems appros 8 years ago that resolved with ABX. Pt states that this feels like it did back then.

## 2011-12-17 NOTE — ED Provider Notes (Signed)
History     CSN: [id number]  Arrival date & time 12/17/11  2021   First MD Initiated Contact with Patient 12/17/11 2221      Chief Complaint  Patient presents with  . Lymphadenopathy   HPI  History provided by the patient. Patient is a 71 year old female who presents with complaints of right cheek and face swelling. Patient reports having similar symptoms the past. She awoke this morning and while eating breakfast and after breakfast she began having pain and swelling to her right jaw and cheek area. Patient states this happened several years ago and seemed to go away with Benadryl. She denies feeling ill or having any other symptoms. She does report taking 2 Benadryl today without any change. She denies having any dry mouth symptoms. She denies any nasal congestion, rhinorrhea or ear pain. Denies any fever, chills or sweats.    Past Medical History  Diagnosis Date  . Hypothyroidism   . Hyperlipidemia   . Hypertension   . Paroxysmal atrial fibrillation   . Obesity   . Sick sinus syndrome   . Aortic insufficiency     Trivial  . Mitral regurgitation   . Bronchitis   . Rhinitis     Past Surgical History  Procedure Date  . Tonsillectomy   . Pacemaker insertion     Status post insertion  . Status post child birth x1   . Rotator cuff repair     left    Family History  Problem Relation Age of Onset  . Stroke Mother   . Alzheimer's disease Father   . Hypertension Sister   . Heart disease Mother   . Arthritis Sister   . Cancer Brother   . Cancer Maternal Grandfather     History  Substance Use Topics  . Smoking status: Never Smoker   . Smokeless tobacco: Never Used  . Alcohol Use: No    OB History    Grav Para Term Preterm Abortions TAB SAB Ect Mult Living                  Review of Systems  Constitutional: Negative for fever, chills and diaphoresis.  HENT: Negative for congestion, sore throat, rhinorrhea and mouth sores.   Respiratory: Negative for cough.    Gastrointestinal: Negative for nausea and vomiting.    Allergies  Contrast media  Home Medications   Current Outpatient Rx  Name Route Sig Dispense Refill  . COLESEVELAM HCL 625 MG PO TABS Oral Take 1,250 mg by mouth 2 (two) times daily with a meal.     . LIOTHYRONINE SODIUM 5 MCG PO TABS Oral Take 5 mcg by mouth daily.     Marland Kitchen METOPROLOL TARTRATE 50 MG PO TABS Oral Take 75 mg by mouth 2 (two) times daily.    Marland Kitchen OMEPRAZOLE 20 MG PO CPDR Oral Take 20 mg by mouth daily.    Marland Kitchen POTASSIUM CHLORIDE 10 MEQ PO TBCR Oral Take 10 mEq by mouth daily.      Marland Kitchen PROGESTERONE MICRONIZED 100 MG PO CAPS Oral Take 100 mg by mouth daily.     . TRIAMTERENE-HCTZ 75-50 MG PO TABS Oral Take 1 tablet by mouth daily.    . WARFARIN SODIUM 5 MG PO TABS Oral Take 2.5-5 mg by mouth daily. Sun-0.5 half tab (2.5 mg total); All other days-1 tab (5 mg total)      BP 128/88  Pulse 82  Temp 98.5 F (36.9 C) (Oral)  Resp 18  SpO2 98%  Physical Exam  Nursing note and vitals reviewed. Constitutional: She is oriented to person, place, and time. She appears well-developed and well-nourished. No distress.  HENT:  Head: Normocephalic.       Mild swelling and slight tenderness over the right parotid gland area. Ear exam normal. No preauricular lymphadenopathy. No cervical adenopathy in neck. No definite blockage noted on the parotid gland duct in mouth. There does appears to be slight swelling on the right side.  Neck: Normal range of motion. Neck supple.  Cardiovascular: Normal rate.   Pulmonary/Chest: Effort normal and breath sounds normal. No respiratory distress. She has no wheezes. She has no rales.  Lymphadenopathy:    She has no cervical adenopathy.  Neurological: She is alert and oriented to person, place, and time.  Skin: Skin is warm and dry.  Psychiatric: She has a normal mood and affect. Her behavior is normal.    ED Course  Procedures      1. Parotid duct obstruction       MDM  Patient seen and  evaluated. Patient in no acute distress. Symptoms appear consistent with prior gland swelling and obstruction. No fever or other concerning symptoms.        Angus Seller, [place] 12/17/11 2326

## 2011-12-19 DIAGNOSIS — K1120 Sialoadenitis, unspecified: Secondary | ICD-10-CM | POA: Diagnosis not present

## 2011-12-19 NOTE — ED Provider Notes (Signed)
Medical screening examination/treatment/procedure(s) were performed by non-physician practitioner and as supervising physician I was immediately available for consultation/collaboration.  Cheri Guppy, MD 12/19/11 [phone]

## 2012-01-09 DIAGNOSIS — D6859 Other primary thrombophilia: Secondary | ICD-10-CM | POA: Diagnosis not present

## 2012-01-09 DIAGNOSIS — I4891 Unspecified atrial fibrillation: Secondary | ICD-10-CM | POA: Diagnosis not present

## 2012-01-19 DIAGNOSIS — I495 Sick sinus syndrome: Secondary | ICD-10-CM

## 2012-02-06 DIAGNOSIS — I4891 Unspecified atrial fibrillation: Secondary | ICD-10-CM | POA: Diagnosis not present

## 2012-02-06 DIAGNOSIS — D6859 Other primary thrombophilia: Secondary | ICD-10-CM | POA: Diagnosis not present

## 2012-03-05 DIAGNOSIS — D6859 Other primary thrombophilia: Secondary | ICD-10-CM | POA: Diagnosis not present

## 2012-03-05 DIAGNOSIS — I4891 Unspecified atrial fibrillation: Secondary | ICD-10-CM | POA: Diagnosis not present

## 2012-03-19 DIAGNOSIS — D485 Neoplasm of uncertain behavior of skin: Secondary | ICD-10-CM | POA: Diagnosis not present

## 2012-03-19 DIAGNOSIS — L723 Sebaceous cyst: Secondary | ICD-10-CM | POA: Diagnosis not present

## 2012-04-02 DIAGNOSIS — D6859 Other primary thrombophilia: Secondary | ICD-10-CM | POA: Diagnosis not present

## 2012-04-02 DIAGNOSIS — I4891 Unspecified atrial fibrillation: Secondary | ICD-10-CM | POA: Diagnosis not present

## 2012-04-11 ENCOUNTER — Other Ambulatory Visit: Payer: Self-pay | Admitting: Cardiology

## 2012-04-22 DIAGNOSIS — I495 Sick sinus syndrome: Secondary | ICD-10-CM | POA: Diagnosis not present

## 2012-04-30 DIAGNOSIS — I4891 Unspecified atrial fibrillation: Secondary | ICD-10-CM | POA: Diagnosis not present

## 2012-05-28 DIAGNOSIS — D6859 Other primary thrombophilia: Secondary | ICD-10-CM | POA: Diagnosis not present

## 2012-05-28 DIAGNOSIS — I4891 Unspecified atrial fibrillation: Secondary | ICD-10-CM | POA: Diagnosis not present

## 2012-06-25 DIAGNOSIS — D6859 Other primary thrombophilia: Secondary | ICD-10-CM | POA: Diagnosis not present

## 2012-06-25 DIAGNOSIS — I4891 Unspecified atrial fibrillation: Secondary | ICD-10-CM | POA: Diagnosis not present

## 2012-07-23 DIAGNOSIS — I4891 Unspecified atrial fibrillation: Secondary | ICD-10-CM | POA: Diagnosis not present

## 2012-07-23 DIAGNOSIS — I495 Sick sinus syndrome: Secondary | ICD-10-CM

## 2012-07-23 DIAGNOSIS — D6859 Other primary thrombophilia: Secondary | ICD-10-CM | POA: Diagnosis not present

## 2012-08-01 ENCOUNTER — Encounter: Payer: Self-pay | Admitting: Internal Medicine

## 2012-08-20 DIAGNOSIS — D6859 Other primary thrombophilia: Secondary | ICD-10-CM | POA: Diagnosis not present

## 2012-08-20 DIAGNOSIS — I4891 Unspecified atrial fibrillation: Secondary | ICD-10-CM | POA: Diagnosis not present

## 2012-09-17 DIAGNOSIS — D6859 Other primary thrombophilia: Secondary | ICD-10-CM | POA: Diagnosis not present

## 2012-09-17 DIAGNOSIS — I4891 Unspecified atrial fibrillation: Secondary | ICD-10-CM | POA: Diagnosis not present

## 2012-09-19 DIAGNOSIS — F40298 Other specified phobia: Secondary | ICD-10-CM | POA: Diagnosis not present

## 2012-09-19 DIAGNOSIS — E119 Type 2 diabetes mellitus without complications: Secondary | ICD-10-CM | POA: Diagnosis not present

## 2012-09-19 DIAGNOSIS — E039 Hypothyroidism, unspecified: Secondary | ICD-10-CM | POA: Diagnosis not present

## 2012-09-19 DIAGNOSIS — F411 Generalized anxiety disorder: Secondary | ICD-10-CM | POA: Diagnosis not present

## 2012-10-01 DIAGNOSIS — R5381 Other malaise: Secondary | ICD-10-CM | POA: Diagnosis not present

## 2012-10-01 DIAGNOSIS — R5383 Other fatigue: Secondary | ICD-10-CM | POA: Diagnosis not present

## 2012-10-01 DIAGNOSIS — E559 Vitamin D deficiency, unspecified: Secondary | ICD-10-CM | POA: Diagnosis not present

## 2012-10-01 DIAGNOSIS — E7800 Pure hypercholesterolemia, unspecified: Secondary | ICD-10-CM | POA: Diagnosis not present

## 2012-10-01 DIAGNOSIS — I10 Essential (primary) hypertension: Secondary | ICD-10-CM | POA: Diagnosis not present

## 2012-10-04 ENCOUNTER — Other Ambulatory Visit: Payer: Self-pay | Admitting: Cardiology

## 2012-10-15 DIAGNOSIS — I4891 Unspecified atrial fibrillation: Secondary | ICD-10-CM | POA: Diagnosis not present

## 2012-10-15 DIAGNOSIS — D6859 Other primary thrombophilia: Secondary | ICD-10-CM | POA: Diagnosis not present

## 2012-11-06 DIAGNOSIS — I2584 Coronary atherosclerosis due to calcified coronary lesion: Secondary | ICD-10-CM | POA: Diagnosis not present

## 2012-11-06 DIAGNOSIS — R7309 Other abnormal glucose: Secondary | ICD-10-CM | POA: Diagnosis not present

## 2012-11-06 DIAGNOSIS — E2749 Other adrenocortical insufficiency: Secondary | ICD-10-CM | POA: Diagnosis not present

## 2012-11-06 DIAGNOSIS — D696 Thrombocytopenia, unspecified: Secondary | ICD-10-CM | POA: Diagnosis not present

## 2012-11-06 DIAGNOSIS — M818 Other osteoporosis without current pathological fracture: Secondary | ICD-10-CM | POA: Diagnosis not present

## 2012-11-06 DIAGNOSIS — E538 Deficiency of other specified B group vitamins: Secondary | ICD-10-CM | POA: Diagnosis not present

## 2012-11-06 DIAGNOSIS — R5381 Other malaise: Secondary | ICD-10-CM | POA: Diagnosis not present

## 2012-11-06 DIAGNOSIS — M109 Gout, unspecified: Secondary | ICD-10-CM | POA: Diagnosis not present

## 2012-11-06 DIAGNOSIS — E039 Hypothyroidism, unspecified: Secondary | ICD-10-CM | POA: Diagnosis not present

## 2012-11-06 DIAGNOSIS — R0609 Other forms of dyspnea: Secondary | ICD-10-CM | POA: Diagnosis not present

## 2012-11-06 DIAGNOSIS — E618 Deficiency of other specified nutrient elements: Secondary | ICD-10-CM | POA: Diagnosis not present

## 2012-11-06 DIAGNOSIS — R7989 Other specified abnormal findings of blood chemistry: Secondary | ICD-10-CM | POA: Diagnosis not present

## 2012-11-06 DIAGNOSIS — E785 Hyperlipidemia, unspecified: Secondary | ICD-10-CM | POA: Diagnosis not present

## 2012-11-06 DIAGNOSIS — N959 Unspecified menopausal and perimenopausal disorder: Secondary | ICD-10-CM | POA: Diagnosis not present

## 2012-11-12 DIAGNOSIS — D6859 Other primary thrombophilia: Secondary | ICD-10-CM | POA: Diagnosis not present

## 2012-11-12 DIAGNOSIS — I4891 Unspecified atrial fibrillation: Secondary | ICD-10-CM | POA: Diagnosis not present

## 2012-11-26 ENCOUNTER — Ambulatory Visit (INDEPENDENT_AMBULATORY_CARE_PROVIDER_SITE_OTHER): Payer: Medicare Other | Admitting: Internal Medicine

## 2012-11-26 ENCOUNTER — Encounter: Payer: Self-pay | Admitting: Internal Medicine

## 2012-11-26 VITALS — BP 147/83 | HR 70 | Ht 62.0 in | Wt 278.0 lb

## 2012-11-26 DIAGNOSIS — R6889 Other general symptoms and signs: Secondary | ICD-10-CM | POA: Diagnosis not present

## 2012-11-26 DIAGNOSIS — I495 Sick sinus syndrome: Secondary | ICD-10-CM | POA: Diagnosis not present

## 2012-11-26 DIAGNOSIS — I4891 Unspecified atrial fibrillation: Secondary | ICD-10-CM | POA: Diagnosis not present

## 2012-11-26 DIAGNOSIS — I10 Essential (primary) hypertension: Secondary | ICD-10-CM | POA: Diagnosis not present

## 2012-11-26 DIAGNOSIS — Z950 Presence of cardiac pacemaker: Secondary | ICD-10-CM

## 2012-11-26 DIAGNOSIS — E669 Obesity, unspecified: Secondary | ICD-10-CM

## 2012-11-26 DIAGNOSIS — R5381 Other malaise: Secondary | ICD-10-CM | POA: Diagnosis not present

## 2012-11-26 DIAGNOSIS — R7989 Other specified abnormal findings of blood chemistry: Secondary | ICD-10-CM | POA: Diagnosis not present

## 2012-11-26 LAB — PACEMAKER DEVICE OBSERVATION
ATRIAL PACING PM: 83
BRDY-0002RV: 60 {beats}/min
BRDY-0005RV: 50 {beats}/min
DEVICE MODEL PM: 2056081
RV LEAD IMPEDENCE PM: 582 Ohm
RV LEAD THRESHOLD: 0.625 V
VENTRICULAR PACING PM: 100

## 2012-11-26 NOTE — Assessment & Plan Note (Signed)
I've encouraged the patient to continue her gluten-free diet. She is also encouraged to increase her physical activity.

## 2012-11-26 NOTE — Patient Instructions (Addendum)
Your physician wants you to follow-up in: 12 months with Dr. Taylor. You will receive a reminder letter in the mail two months in advance. If you don't receive a letter, please call our office to schedule the follow-up appointment.    

## 2012-11-26 NOTE — Assessment & Plan Note (Signed)
Her blood pressure is stable today. At times it is on the low side. When her blood pressure is low, I've asked the patient to hold her metoprolol and or her Maxide.

## 2012-11-26 NOTE — Progress Notes (Signed)
HPI [name] returns today for followup. She is a very pleasant 72 year old woman with a history of hypertension, symptomatic bradycardia, chronic atrial fibrillation, status post permanent pacemaker insertion. She remains morbidly obese but notes that she has lost 12 lbs over the past month on a low gluten diet. The patient notes occasional mild peripheral edema. She denies chest pain or shortness of breath. No recent falls. She does have arthritis. She has minimal palpitations. Allergies  Allergen Reactions  . Contrast Media [Iodinated Diagnostic Agents]     Contrast dye causes swelling     Current Outpatient Prescriptions  Medication Sig Dispense Refill  . colesevelam (WELCHOL) 625 MG tablet Take 1,250 mg by mouth 2 (two) times daily with a meal.       . liothyronine (CYTOMEL) 5 MCG tablet Take 5 mcg by mouth daily.       . metoprolol (LOPRESSOR) 50 MG tablet Take 75 mg by mouth 2 (two) times daily.      . metoprolol (LOPRESSOR) 50 MG tablet TAKE 1 & 1/2 TABLETS BY MOUTH 2 TIMES A DAY  180 tablet  2  . omeprazole (PRILOSEC) 20 MG capsule Take 20 mg by mouth daily.      . potassium chloride (KLOR-CON) 10 MEQ CR tablet Take 10 mEq by mouth daily.        . progesterone (PROMETRIUM) 100 MG capsule Take 100 mg by mouth daily.       Marland Kitchen triamterene-hydrochlorothiazide (MAXZIDE) 75-50 MG per tablet Take 1 tablet by mouth daily.      Marland Kitchen warfarin (COUMADIN) 5 MG tablet Take 2.5-5 mg by mouth daily. Sun-0.5 half tab (2.5 mg total); All other days-1 tab (5 mg total)       No current facility-administered medications for this visit.     Past Medical History  Diagnosis Date  . Hypothyroidism   . Hyperlipidemia   . Hypertension   . Paroxysmal atrial fibrillation   . Obesity   . Sick sinus syndrome   . Aortic insufficiency     Trivial  . Mitral regurgitation   . Bronchitis   . Rhinitis     ROS:   All systems reviewed and negative except as noted in the HPI.   Past Surgical History   Procedure Laterality Date  . Tonsillectomy    . Pacemaker insertion      Status post insertion  . Status post child birth x1    . Rotator cuff repair      left     Family History  Problem Relation Age of Onset  . Stroke Mother   . Alzheimer's disease Father   . Hypertension Sister   . Heart disease Mother   . Arthritis Sister   . Cancer Brother   . Cancer Maternal Grandfather      History   Social History  . Marital Status: Married    Spouse Name: N/A    Number of Children: 1  . Years of Education: N/A   Occupational History  . Data entry clerk    Social History Main Topics  . Smoking status: Never Smoker   . Smokeless tobacco: Never Used  . Alcohol Use: No  . Drug Use: No  . Sexual Activity: Not on file   Other Topics Concern  . Not on file   Social History Narrative  . No narrative on file    Blood pressure 147/84, pulse 70, weight 278 pounds, height 5 foot 2 inches  Physical Exam:  Morbidly  obese appearing 72 year old woman, NAD HEENT: Unremarkable Neck:  No JVD, no thyromegally Lungs:  Clear with no wheezes, rales, or rhonchi. Well-healed pacemaker incision. HEART:  Regular rate rhythm, no murmurs, no rubs, no clicks Abd:  soft, obese, positive bowel sounds, no organomegally, no rebound, no guarding Ext:  2 plus pulses, trace edema, no cyanosis, no clubbing Skin:  No rashes no nodules Neuro:  CN II through XII intact, motor grossly intact  DEVICE  Normal device function.  See PaceArt for details.   Assess/Plan:

## 2012-11-26 NOTE — Assessment & Plan Note (Signed)
her [employer]. Jude dual-chamber pacemaker is working normally except for some under sensing in the atrium. Because of chronic atrial fibrillation, we have reprogrammed her device to the VVI mode.

## 2012-12-04 ENCOUNTER — Other Ambulatory Visit: Payer: Self-pay

## 2012-12-04 MED ORDER — METOPROLOL TARTRATE 50 MG PO TABS: 75.0000 mg | ORAL_TABLET | Freq: Two times a day (BID) | ORAL | Status: DC

## 2012-12-06 ENCOUNTER — Encounter: Payer: Self-pay | Admitting: Internal Medicine

## 2012-12-17 DIAGNOSIS — D6859 Other primary thrombophilia: Secondary | ICD-10-CM | POA: Diagnosis not present

## 2012-12-17 DIAGNOSIS — I4891 Unspecified atrial fibrillation: Secondary | ICD-10-CM | POA: Diagnosis not present

## 2013-01-10 DIAGNOSIS — R7989 Other specified abnormal findings of blood chemistry: Secondary | ICD-10-CM | POA: Diagnosis not present

## 2013-01-10 DIAGNOSIS — R5381 Other malaise: Secondary | ICD-10-CM | POA: Diagnosis not present

## 2013-01-10 DIAGNOSIS — E039 Hypothyroidism, unspecified: Secondary | ICD-10-CM | POA: Diagnosis not present

## 2013-02-12 ENCOUNTER — Other Ambulatory Visit: Payer: Self-pay | Admitting: *Deleted

## 2013-02-12 MED ORDER — METOPROLOL TARTRATE 50 MG PO TABS: 75.0000 mg | ORAL_TABLET | Freq: Two times a day (BID) | ORAL | Status: DC

## 2013-02-18 DIAGNOSIS — I4891 Unspecified atrial fibrillation: Secondary | ICD-10-CM | POA: Diagnosis not present

## 2013-02-26 ENCOUNTER — Encounter: Payer: Self-pay | Admitting: Internal Medicine

## 2013-02-26 DIAGNOSIS — Z950 Presence of cardiac pacemaker: Secondary | ICD-10-CM | POA: Diagnosis not present

## 2013-02-26 DIAGNOSIS — I495 Sick sinus syndrome: Secondary | ICD-10-CM | POA: Diagnosis not present

## 2013-03-18 DIAGNOSIS — I4891 Unspecified atrial fibrillation: Secondary | ICD-10-CM | POA: Diagnosis not present

## 2013-04-15 DIAGNOSIS — I4891 Unspecified atrial fibrillation: Secondary | ICD-10-CM | POA: Diagnosis not present

## 2013-04-25 ENCOUNTER — Other Ambulatory Visit: Payer: Self-pay | Admitting: Internal Medicine

## 2013-05-13 DIAGNOSIS — I4891 Unspecified atrial fibrillation: Secondary | ICD-10-CM | POA: Diagnosis not present

## 2013-05-28 ENCOUNTER — Encounter: Payer: Self-pay | Admitting: Internal Medicine

## 2013-05-28 DIAGNOSIS — I495 Sick sinus syndrome: Secondary | ICD-10-CM

## 2013-05-28 DIAGNOSIS — Z950 Presence of cardiac pacemaker: Secondary | ICD-10-CM | POA: Diagnosis not present

## 2013-06-10 DIAGNOSIS — I4891 Unspecified atrial fibrillation: Secondary | ICD-10-CM | POA: Diagnosis not present

## 2013-07-08 ENCOUNTER — Other Ambulatory Visit: Payer: Self-pay

## 2013-07-08 DIAGNOSIS — I4891 Unspecified atrial fibrillation: Secondary | ICD-10-CM | POA: Diagnosis not present

## 2013-07-08 MED ORDER — METOPROLOL TARTRATE 50 MG PO TABS: ORAL_TABLET | ORAL | Status: DC

## 2013-07-10 ENCOUNTER — Other Ambulatory Visit: Payer: Self-pay

## 2013-07-10 MED ORDER — METOPROLOL TARTRATE 50 MG PO TABS: ORAL_TABLET | ORAL | Status: DC

## 2013-08-05 DIAGNOSIS — I4891 Unspecified atrial fibrillation: Secondary | ICD-10-CM | POA: Diagnosis not present

## 2013-08-27 ENCOUNTER — Encounter: Payer: Self-pay | Admitting: Internal Medicine

## 2013-08-27 DIAGNOSIS — Z950 Presence of cardiac pacemaker: Secondary | ICD-10-CM | POA: Diagnosis not present

## 2013-08-27 DIAGNOSIS — I495 Sick sinus syndrome: Secondary | ICD-10-CM | POA: Diagnosis not present

## 2013-08-28 ENCOUNTER — Ambulatory Visit: Payer: Medicare Other | Admitting: Internal Medicine

## 2013-09-02 DIAGNOSIS — I4891 Unspecified atrial fibrillation: Secondary | ICD-10-CM | POA: Diagnosis not present

## 2013-09-30 DIAGNOSIS — I4891 Unspecified atrial fibrillation: Secondary | ICD-10-CM | POA: Diagnosis not present

## 2013-10-07 ENCOUNTER — Encounter: Payer: Self-pay | Admitting: Internal Medicine

## 2013-10-17 ENCOUNTER — Ambulatory Visit: Payer: Medicare Other | Admitting: Internal Medicine

## 2013-10-28 DIAGNOSIS — I4891 Unspecified atrial fibrillation: Secondary | ICD-10-CM | POA: Diagnosis not present

## 2013-11-07 ENCOUNTER — Ambulatory Visit (INDEPENDENT_AMBULATORY_CARE_PROVIDER_SITE_OTHER): Payer: Medicare Other | Admitting: Internal Medicine

## 2013-11-07 ENCOUNTER — Encounter: Payer: Self-pay | Admitting: Internal Medicine

## 2013-11-07 ENCOUNTER — Ambulatory Visit: Payer: Medicare Other | Admitting: Internal Medicine

## 2013-11-07 VITALS — BP 140/84 | HR 75 | Ht 62.0 in | Wt 250.0 lb

## 2013-11-07 DIAGNOSIS — I10 Essential (primary) hypertension: Secondary | ICD-10-CM

## 2013-11-07 DIAGNOSIS — E785 Hyperlipidemia, unspecified: Secondary | ICD-10-CM

## 2013-11-07 DIAGNOSIS — E039 Hypothyroidism, unspecified: Secondary | ICD-10-CM | POA: Diagnosis not present

## 2013-11-07 LAB — LIPID PANEL
Cholesterol: 208 mg/dL — ABNORMAL HIGH (ref 0–200)
HDL: 50 mg/dL (ref >39.00)
LDL Cholesterol: 130 mg/dL — ABNORMAL HIGH (ref 0–99)
NONHDL: 158
Total CHOL/HDL Ratio: 4
Triglycerides: 142 mg/dL (ref 0.0–149.0)
VLDL: 28.4 mg/dL (ref 0.0–40.0)

## 2013-11-07 LAB — BASIC METABOLIC PANEL
BUN: 19 mg/dL (ref 6–23)
CALCIUM: 9.1 mg/dL (ref 8.4–10.5)
CHLORIDE: 100 meq/L (ref 96–112)
CO2: 29 mEq/L (ref 19–32)
CREATININE: 1.1 mg/dL (ref 0.4–1.2)
GFR: 53.86 mL/min — ABNORMAL LOW (ref >60.00)
Glucose, Bld: 88 mg/dL (ref 70–99)
Potassium: 3.5 mEq/L (ref 3.5–5.1)
Sodium: 137 mEq/L (ref 135–145)

## 2013-11-07 LAB — CBC
HEMATOCRIT: 43.2 % (ref 36.0–46.0)
HEMOGLOBIN: 14.5 g/dL (ref 12.0–15.0)
MCHC: 33.5 g/dL (ref 30.0–36.0)
MCV: 93.6 fl (ref 78.0–100.0)
PLATELETS: 164 10*3/uL (ref 150.0–400.0)
RBC: 4.62 Mil/uL (ref 3.87–5.11)
RDW: 14.6 % (ref 11.5–15.5)
WBC: 6.2 10*3/uL (ref 4.0–10.5)

## 2013-11-07 LAB — TSH: TSH: 1.18 u[IU]/mL (ref 0.35–4.50)

## 2013-11-07 MED ORDER — METOPROLOL TARTRATE 50 MG PO TABS: 75.0000 mg | ORAL_TABLET | Freq: Two times a day (BID) | ORAL | Status: DC

## 2013-11-07 NOTE — Progress Notes (Signed)
HPI Patient is a 73 year old with a history of bradycardia (s/P PPM), afib, HTN, obesity, SOB  Followed by [name].  I saw the patinet in 2013  She was seen by [name] in 2014 Since she was in clinic last she has done fairly well  Breathing is OK  No CP No palpitations  No dizzienss  She uses staitionary bike 2x per day for 5 min.    Allergies  Allergen Reactions  . Contrast Media [Iodinated Diagnostic Agents]     Contrast dye causes swelling    Current Outpatient Prescriptions  Medication Sig Dispense Refill  . colchicine 0.6 MG tablet Take 0.6 mg by mouth as needed.      . colesevelam (WELCHOL) 625 MG tablet Take 1,250 mg by mouth 2 (two) times daily with a meal.       . cyclobenzaprine (FLEXERIL) 5 MG tablet Take 5 mg by mouth 3 (three) times daily as needed for muscle spasms.      . diazepam (VALIUM) 10 MG tablet Take 10 mg by mouth every 6 (six) hours as needed for anxiety.      Marland Kitchen liothyronine (CYTOMEL) 5 MCG tablet Take 5 mcg by mouth daily.       . metoprolol (LOPRESSOR) 50 MG tablet Take 1.5 tablets (75 mg total) by mouth 2 (two) times daily.  270 tablet  3  . potassium chloride (KLOR-CON) 10 MEQ CR tablet Take 10 mEq by mouth daily.        Marland Kitchen triamterene-hydrochlorothiazide (MAXZIDE) 75-50 MG per tablet Take 1 tablet by mouth daily.      Marland Kitchen warfarin (COUMADIN) 5 MG tablet Take 2.5-5 mg by mouth daily. Sun-0.5 half tab (2.5 mg total); All other days-1 tab (5 mg total)       No current facility-administered medications for this visit.    Past Medical History  Diagnosis Date  . Hypothyroidism   . Hyperlipidemia   . Hypertension   . Paroxysmal atrial fibrillation   . Obesity   . Sick sinus syndrome   . Aortic insufficiency     Trivial  . Mitral regurgitation   . Bronchitis   . Rhinitis     Past Surgical History  Procedure Laterality Date  . Tonsillectomy    . Pacemaker insertion      Status post insertion  . Status post child birth x1    . Rotator cuff repair      left    Family History  Problem Relation Age of Onset  . Stroke Mother   . Alzheimer's disease Father   . Hypertension Sister   . Heart disease Mother   . Arthritis Sister   . Cancer Brother   . Cancer Maternal Grandfather     History   Social History  . Marital Status: Married    Spouse Name: N/A    Number of Children: 1  . Years of Education: N/A   Occupational History  . Data entry clerk    Social History Main Topics  . Smoking status: Never Smoker   . Smokeless tobacco: Never Used  . Alcohol Use: No  . Drug Use: No  . Sexual Activity: Not on file   Other Topics Concern  . Not on file   Social History Narrative  . No narrative on file    Review of Systems:  All systems reviewed.  They are negative to the above problem except as previously stated.  Vital Signs: BP 140/84  Pulse 75  Ht 5\' 2"  (1.575 m)  Wt 250 lb (113.399 kg)  BMI 45.71 kg/m2  Physical Exam  Patient is a morbidly obese 73 year old in NAD HEENT:  Normocephalic, atraumatic. EOMI, PERRLA.  Neck: JVP is normal. No thyromegaly. No bruits.  Lungs: clear to auscultation. No rales no wheezes.  Heart: Irregular rate and rhythm. Normal S1, S2. No S3.   No significant murmurs. PMI not displaced.  Abdomen:  Supple, nontender. Normal bowel sounds. No masses. No hepatomegaly.  Extremities:   Good distal pulses throughout. No lower extremity edema.  Musculoskeletal :moving all extremities.  Neuro:   alert and oriented x3.  CN II-XII grossly intact.  EKG:  Afib.  75 bpm  Ventricular paced  Sl ST depresion, Twave inversion V1V2   Assessment and Plan:  1.  HTN  BP is adequately controlled.  Check labs  Forward to primary 2.  Afib. Continue on current regimen  Check labs  Asymptomatic  3.  S/p PPM for bradycardia.  To see [name] later this month    4.  HL  Will check lipids    F/U April/May

## 2013-11-07 NOTE — Patient Instructions (Signed)
Your physician recommends that you return for lab work TODAY (CBC, BMET, TSH, LIPIDS) Your physician recommends that you continue on your current medications as directed. Please refer to the Current Medication list given to you today.  Your physician wants you to follow-up in: April,MAY 2016.  You will receive a reminder letter in the mail two months in advance. If you don't receive a letter, please call our office to schedule the follow-up appointment.

## 2013-11-10 ENCOUNTER — Telehealth: Payer: Self-pay | Admitting: *Deleted

## 2013-11-10 NOTE — Telephone Encounter (Signed)
Message copied by Rodman Key on Mon Nov 10, 2013  5:49 PM ------      Message from: Fay Records      Created: Sun Nov 09, 2013 11:12 PM       CBC and BMET are ok      TSH OK      Lipids are higher than they have been  She was on Zocor in past.      I would stop welchol and start 10 mg lipitor.        F/U lipids and AST in 8 wks. ------

## 2013-11-10 NOTE — Telephone Encounter (Signed)
Spoke with patient. Reviewed Dr. Alan Ripper notes with her. She does not want to take a statin medication. Discussed the dosage and her LDL in particular, while on the welchol. She would like to take a couple days to think about it and call back to discuss further.

## 2013-11-25 DIAGNOSIS — I4891 Unspecified atrial fibrillation: Secondary | ICD-10-CM | POA: Diagnosis not present

## 2013-11-26 ENCOUNTER — Encounter: Payer: Self-pay | Admitting: Internal Medicine

## 2013-11-26 ENCOUNTER — Ambulatory Visit (INDEPENDENT_AMBULATORY_CARE_PROVIDER_SITE_OTHER): Payer: Medicare Other | Admitting: Internal Medicine

## 2013-11-26 VITALS — BP 126/72 | HR 89 | Ht 61.0 in | Wt 242.8 lb

## 2013-11-26 DIAGNOSIS — I10 Essential (primary) hypertension: Secondary | ICD-10-CM

## 2013-11-26 DIAGNOSIS — I495 Sick sinus syndrome: Secondary | ICD-10-CM | POA: Diagnosis not present

## 2013-11-26 DIAGNOSIS — Z950 Presence of cardiac pacemaker: Secondary | ICD-10-CM | POA: Diagnosis not present

## 2013-11-26 DIAGNOSIS — E349 Endocrine disorder, unspecified: Secondary | ICD-10-CM | POA: Diagnosis not present

## 2013-11-26 DIAGNOSIS — R7989 Other specified abnormal findings of blood chemistry: Secondary | ICD-10-CM | POA: Diagnosis not present

## 2013-11-26 DIAGNOSIS — R5381 Other malaise: Secondary | ICD-10-CM | POA: Diagnosis not present

## 2013-11-26 DIAGNOSIS — E7800 Pure hypercholesterolemia, unspecified: Secondary | ICD-10-CM | POA: Diagnosis not present

## 2013-11-26 DIAGNOSIS — M839 Adult osteomalacia, unspecified: Secondary | ICD-10-CM | POA: Diagnosis not present

## 2013-11-26 DIAGNOSIS — R0609 Other forms of dyspnea: Secondary | ICD-10-CM | POA: Diagnosis not present

## 2013-11-26 DIAGNOSIS — R5383 Other fatigue: Secondary | ICD-10-CM | POA: Diagnosis not present

## 2013-11-26 DIAGNOSIS — M818 Other osteoporosis without current pathological fracture: Secondary | ICD-10-CM | POA: Diagnosis not present

## 2013-11-26 DIAGNOSIS — B481 Rhinosporidiosis: Secondary | ICD-10-CM | POA: Diagnosis not present

## 2013-11-26 LAB — MDC_IDC_ENUM_SESS_TYPE_INCLINIC
Battery Impedance: 2300 Ohm
Brady Statistic RV Percent Paced: 68 %
Implantable Pulse Generator Serial Number: 2056081
Lead Channel Impedance Value: 591 Ohm
Lead Channel Pacing Threshold Pulse Width: 0.4 ms
Lead Channel Sensing Intrinsic Amplitude: 12 mV
Lead Channel Setting Pacing Pulse Width: 0.4 ms
Lead Channel Setting Sensing Sensitivity: 2 mV
MDC IDC MSMT BATTERY VOLTAGE: 2.78 V
MDC IDC MSMT LEADCHNL RV PACING THRESHOLD AMPLITUDE: 0.75 V
MDC IDC SESS DTM: 20150930095943
MDC IDC SET LEADCHNL RV PACING AMPLITUDE: 2.5 V

## 2013-11-26 NOTE — Progress Notes (Signed)
HPI Mrs. Vandevelde returns today for followup. She is a very pleasant 73 year old woman with a history of hypertension, symptomatic bradycardia, chronic atrial fibrillation, status post permanent pacemaker insertion. She remains morbidly obese. The patient notes occasional mild peripheral edema. She denies chest pain or shortness of breath. No recent falls. She does have arthritis. She has minimal palpitations. She has changed her lifestyle and lost over 40 lbs.  Allergies  Allergen Reactions  . Contrast Media [Iodinated Diagnostic Agents] Rash    Contrast dye causes swelling     Current Outpatient Prescriptions  Medication Sig Dispense Refill  . colchicine 0.6 MG tablet Take 0.6 mg by mouth as needed.      . colesevelam (WELCHOL) 625 MG tablet Take 1,250 mg by mouth 2 (two) times daily with a meal.       . cyclobenzaprine (FLEXERIL) 5 MG tablet Take 5 mg by mouth 3 (three) times daily as needed for muscle spasms.      . diazepam (VALIUM) 10 MG tablet Take 10 mg by mouth every 6 (six) hours as needed for anxiety.      Marland Kitchen liothyronine (CYTOMEL) 5 MCG tablet Take 5 mcg by mouth daily.       . metoprolol (LOPRESSOR) 50 MG tablet Take 1.5 tablets (75 mg total) by mouth 2 (two) times daily.  270 tablet  3  . potassium chloride (KLOR-CON) 10 MEQ CR tablet Take 10 mEq by mouth daily.        Marland Kitchen triamterene-hydrochlorothiazide (MAXZIDE) 75-50 MG per tablet Take 1 tablet by mouth daily.      Marland Kitchen warfarin (COUMADIN) 5 MG tablet Take 2.5-5 mg by mouth daily. Sun-0.5 half tab (2.5 mg total); All other days-1 tab (5 mg total)       No current facility-administered medications for this visit.     Past Medical History  Diagnosis Date  . Hypothyroidism   . Hyperlipidemia   . Hypertension   . Paroxysmal atrial fibrillation   . Obesity   . Sick sinus syndrome   . Aortic insufficiency     Trivial  . Mitral regurgitation   . Bronchitis   . Rhinitis     ROS:   All systems reviewed and negative except as  noted in the HPI.   Past Surgical History  Procedure Laterality Date  . Tonsillectomy    . Pacemaker insertion      Status post insertion  . Status post child birth x1    . Rotator cuff repair      left     Family History  Problem Relation Age of Onset  . Stroke Mother   . Alzheimer's disease Father   . Hypertension Sister   . Heart disease Mother   . Arthritis Sister   . Cancer Brother   . Cancer Maternal Grandfather      History   Social History  . Marital Status: Married    Spouse Name: N/A    Number of Children: 1  . Years of Education: N/A   Occupational History  . Data entry clerk    Social History Main Topics  . Smoking status: Never Smoker   . Smokeless tobacco: Never Used  . Alcohol Use: No  . Drug Use: No  . Sexual Activity: Not on file   Other Topics Concern  . Not on file   Social History Narrative  . No narrative on file    Blood pressure 126/72, pulse 89, weight 242 pounds, height 5 foot 2  inches  Physical Exam:  obese appearing 73 year old woman, NAD HEENT: Unremarkable Neck:  No JVD, no thyromegally Lungs:  Clear with no wheezes, rales, or rhonchi. Well-healed pacemaker incision. HEART:  IRegular rate rhythm, no murmurs, no rubs, no clicks Abd:  soft, obese, positive bowel sounds, no organomegally, no rebound, no guarding Ext:  2 plus pulses, trace edema, no cyanosis, no clubbing Skin:  No rashes no nodules Neuro:  CN II through XII intact, motor grossly intact  DEVICE  Normal device function.  See PaceArt for details.   Assess/Plan:

## 2013-11-26 NOTE — Assessment & Plan Note (Signed)
Her St. Jude DDD PM is working normally. She is programmed VVI 60.

## 2013-11-26 NOTE — Patient Instructions (Signed)
Your physician wants you to follow-up in: 1 year with Dr. Taylor. You will receive a reminder letter in the mail two months in advance. If you don't receive a letter, please call our office to schedule the follow-up appointment.  

## 2013-11-26 NOTE — Assessment & Plan Note (Signed)
Her blood pressure has improved with weight loss. I have encouraged her to continue to exercise and diet.

## 2013-11-27 DIAGNOSIS — E559 Vitamin D deficiency, unspecified: Secondary | ICD-10-CM | POA: Diagnosis not present

## 2013-11-27 DIAGNOSIS — N951 Menopausal and female climacteric states: Secondary | ICD-10-CM | POA: Diagnosis not present

## 2013-11-28 ENCOUNTER — Encounter: Payer: Self-pay | Admitting: Internal Medicine

## 2013-12-06 DIAGNOSIS — R102 Pelvic and perineal pain: Secondary | ICD-10-CM | POA: Diagnosis not present

## 2013-12-06 DIAGNOSIS — M545 Low back pain: Secondary | ICD-10-CM | POA: Diagnosis not present

## 2013-12-08 ENCOUNTER — Other Ambulatory Visit: Payer: Self-pay | Admitting: Orthopedic Surgery

## 2013-12-08 DIAGNOSIS — S22089A Unspecified fracture of T11-T12 vertebra, initial encounter for closed fracture: Secondary | ICD-10-CM

## 2013-12-10 ENCOUNTER — Ambulatory Visit
Admission: RE | Admit: 2013-12-10 | Discharge: 2013-12-10 | Disposition: A | Discharge status: Home / Self Care | Payer: Medicare Other | Source: Ambulatory Visit | Attending: Orthopedic Surgery | Admitting: Orthopedic Surgery

## 2013-12-10 DIAGNOSIS — M47816 Spondylosis without myelopathy or radiculopathy, lumbar region: Secondary | ICD-10-CM | POA: Diagnosis not present

## 2013-12-10 DIAGNOSIS — M5126 Other intervertebral disc displacement, lumbar region: Secondary | ICD-10-CM | POA: Diagnosis not present

## 2013-12-10 DIAGNOSIS — S22089A Unspecified fracture of T11-T12 vertebra, initial encounter for closed fracture: Secondary | ICD-10-CM

## 2013-12-15 DIAGNOSIS — M5136 Other intervertebral disc degeneration, lumbar region: Secondary | ICD-10-CM | POA: Diagnosis not present

## 2013-12-23 DIAGNOSIS — I4891 Unspecified atrial fibrillation: Secondary | ICD-10-CM | POA: Diagnosis not present

## 2014-01-20 DIAGNOSIS — I4891 Unspecified atrial fibrillation: Secondary | ICD-10-CM | POA: Diagnosis not present

## 2014-01-30 DIAGNOSIS — R7989 Other specified abnormal findings of blood chemistry: Secondary | ICD-10-CM | POA: Diagnosis not present

## 2014-01-30 DIAGNOSIS — E780 Pure hypercholesterolemia: Secondary | ICD-10-CM | POA: Diagnosis not present

## 2014-01-30 DIAGNOSIS — R739 Hyperglycemia, unspecified: Secondary | ICD-10-CM | POA: Diagnosis not present

## 2014-02-06 DIAGNOSIS — D8989 Other specified disorders involving the immune mechanism, not elsewhere classified: Secondary | ICD-10-CM | POA: Diagnosis not present

## 2014-02-06 DIAGNOSIS — Z77120 Contact with and (suspected) exposure to mold (toxic): Secondary | ICD-10-CM | POA: Diagnosis not present

## 2014-02-18 DIAGNOSIS — I4891 Unspecified atrial fibrillation: Secondary | ICD-10-CM | POA: Diagnosis not present

## 2014-02-25 DIAGNOSIS — R001 Bradycardia, unspecified: Secondary | ICD-10-CM | POA: Diagnosis not present

## 2014-03-17 DIAGNOSIS — I4891 Unspecified atrial fibrillation: Secondary | ICD-10-CM | POA: Diagnosis not present

## 2014-04-14 DIAGNOSIS — I4891 Unspecified atrial fibrillation: Secondary | ICD-10-CM | POA: Diagnosis not present

## 2014-04-25 ENCOUNTER — Ambulatory Visit (INDEPENDENT_AMBULATORY_CARE_PROVIDER_SITE_OTHER): Payer: Medicare Other | Admitting: Family Medicine

## 2014-04-25 VITALS — BP 122/72 | HR 109 | Temp 98.6°F | Resp 17 | Ht 62.5 in | Wt 249.0 lb

## 2014-04-25 DIAGNOSIS — R062 Wheezing: Secondary | ICD-10-CM | POA: Diagnosis not present

## 2014-04-25 DIAGNOSIS — J069 Acute upper respiratory infection, unspecified: Secondary | ICD-10-CM

## 2014-04-25 DIAGNOSIS — R059 Cough, unspecified: Secondary | ICD-10-CM

## 2014-04-25 DIAGNOSIS — R05 Cough: Secondary | ICD-10-CM | POA: Diagnosis not present

## 2014-04-25 DIAGNOSIS — I4891 Unspecified atrial fibrillation: Secondary | ICD-10-CM | POA: Diagnosis not present

## 2014-04-25 MED ORDER — HYDROCOD POLST-CHLORPHEN POLST 10-8 MG/5ML PO LQCR: 5.0000 mL | Freq: Two times a day (BID) | ORAL | Status: DC | PRN

## 2014-04-25 MED ORDER — IPRATROPIUM BROMIDE 0.03 % NA SOLN: 2.0000 | Freq: Two times a day (BID) | NASAL | Status: DC

## 2014-04-25 MED ORDER — ALBUTEROL SULFATE HFA 108 (90 BASE) MCG/ACT IN AERS: 2.0000 | INHALATION_SPRAY | RESPIRATORY_TRACT | Status: DC | PRN

## 2014-04-25 NOTE — Patient Instructions (Signed)
Drink plenty of fluids and get enough rest  Take the cough syrup, Tussionex, 1/2-3/4 teaspoons every 12 hours if needed for cough. Primarily use it at nighttime.  Use the inhaler 2 inhalations every 4-6 hours when needed for wheezing or coughing  Use the nasal spray 2 sprays each nostril every 6 hours as needed for congestion  Return if worse  If starting to run a significant fever or increasing shortness of breath then you would need other testing done and you should get rechecked promptly

## 2014-04-25 NOTE — Progress Notes (Signed)
Subjective: 74 year old lady who has a respiratory tract infection. Her husband was sick with it and came in earlier this week he has been on Augmentin age and is just gradually getting better. He gave her some of his Tussionex last night and she slept much better. The cough has been bothering her a lot for the last couple of days. No fever. Some nasal congestion. Only scanty mucus production with cough. She's been wheezing intermittently. She is on anticoagulant therapy and concerned that medicines might mess that up. She has atrial fibrillation intermittently.  Objective: Coughing and congested. Her TMs are normal. She apparently has had ear infections in the past. Her throat is clear. Nose congested. Neck supple without significant nodes. Chest is fairly clear though she reports wheezing off and on. Heart is fairly regular but enough irregularity that I believe she still is in atrial fibrillation.  Assessment: Cough Upper respiratory congestion Viral syndrome Atrial fibrillation on anticoagulant therapy  Plan: Treat symptomatically. Reassured her that medicines I'm giving her should be fine with her anticoagulants. Return if in all worse. I do not believe any antibiotics are indicated at this time. Explained to them that with the quick transmission from her husband to her that that is all consistent with a viral illness.

## 2014-05-01 ENCOUNTER — Encounter (HOSPITAL_COMMUNITY): Payer: Self-pay | Admitting: Emergency Medicine

## 2014-05-01 ENCOUNTER — Emergency Department (INDEPENDENT_AMBULATORY_CARE_PROVIDER_SITE_OTHER)
Admission: EM | Admit: 2014-05-01 | Discharge: 2014-05-01 | Disposition: A | Discharge status: Home / Self Care | Payer: Medicare Other | Source: Home / Self Care | Attending: Family Medicine | Admitting: Family Medicine

## 2014-05-01 ENCOUNTER — Emergency Department (INDEPENDENT_AMBULATORY_CARE_PROVIDER_SITE_OTHER): Payer: Medicare Other

## 2014-05-01 DIAGNOSIS — R062 Wheezing: Secondary | ICD-10-CM

## 2014-05-01 DIAGNOSIS — R05 Cough: Secondary | ICD-10-CM | POA: Diagnosis not present

## 2014-05-01 DIAGNOSIS — R509 Fever, unspecified: Secondary | ICD-10-CM

## 2014-05-01 DIAGNOSIS — J45901 Unspecified asthma with (acute) exacerbation: Secondary | ICD-10-CM

## 2014-05-01 DIAGNOSIS — R0982 Postnasal drip: Secondary | ICD-10-CM

## 2014-05-01 DIAGNOSIS — R059 Cough, unspecified: Secondary | ICD-10-CM

## 2014-05-01 LAB — POCT RAPID STREP A: Streptococcus, Group A Screen (Direct): NEGATIVE

## 2014-05-01 MED ORDER — ALBUTEROL SULFATE HFA 108 (90 BASE) MCG/ACT IN AERS: 2.0000 | INHALATION_SPRAY | RESPIRATORY_TRACT | Status: DC | PRN

## 2014-05-01 MED ORDER — METHYLPREDNISOLONE SODIUM SUCC 125 MG IJ SOLR
125.0000 mg | Freq: Once | INTRAMUSCULAR | Status: AC
  Administered 2014-05-01: 125 mg via INTRAMUSCULAR

## 2014-05-01 MED ORDER — IPRATROPIUM-ALBUTEROL 0.5-2.5 (3) MG/3ML IN SOLN
3.0000 mL | Freq: Once | RESPIRATORY_TRACT | Status: AC
  Administered 2014-05-01: 3 mL via RESPIRATORY_TRACT

## 2014-05-01 MED ORDER — SPACER/AERO-HOLDING CHAMBERS DEVI: Status: DC

## 2014-05-01 MED ORDER — METHYLPREDNISOLONE SODIUM SUCC 125 MG IJ SOLR: 125.0000 mg | Freq: Once | INTRAMUSCULAR | Status: DC

## 2014-05-01 MED ORDER — METHYLPREDNISOLONE SODIUM SUCC 125 MG IJ SOLR
INTRAMUSCULAR | Status: AC
  Filled 2014-05-01: qty 2

## 2014-05-01 MED ORDER — IPRATROPIUM-ALBUTEROL 0.5-2.5 (3) MG/3ML IN SOLN
RESPIRATORY_TRACT | Status: AC
  Filled 2014-05-01: qty 3

## 2014-05-01 MED ORDER — HYDROCOD POLST-CHLORPHEN POLST 10-8 MG/5ML PO LQCR: 5.0000 mL | Freq: Two times a day (BID) | ORAL | Status: DC | PRN

## 2014-05-01 MED ORDER — PREDNISONE 50 MG PO TABS: ORAL_TABLET | ORAL | Status: DC

## 2014-05-01 NOTE — Discharge Instructions (Signed)
Your symptoms are likely from a viral illness causing post nasal drip and a cough which has caused your asthma to flare. THis was treated with a breathing treatment and steroids in our office.  Please continue using your inhaler every 4 hours for the next 1-2 days then as needed Please take the prednisone daily Please start a daily allergy pill such as zyrtec or allegra. Please double your dose of nasal atrovent

## 2014-05-01 NOTE — ED Provider Notes (Signed)
CSN: [id number]     Arrival date & time 05/01/14  1123 History   None    Chief Complaint  Patient presents with  . URI   (Consider location/radiation/quality/duration/timing/severity/associated sxs/prior Treatment) HPI  Cough: started 1 week ago. Worse at night. Associated with wheezing, chills, fevers to 101. Cough is sometimes productive. Seen at [facility] on 2/27 and given tussionex, nasal atrovent w/o benefit. Atrovent nasal spray does help with the nasal discharge. Getting worse. Denies nausea, vomiting, CP, ABD pain, SOB, syncope, palpitations, diarrhea, rash, HA.    Past Medical History  Diagnosis Date  . Hypothyroidism   . Hyperlipidemia   . Hypertension   . Paroxysmal atrial fibrillation   . Obesity   . Sick sinus syndrome   . Aortic insufficiency     Trivial  . Mitral regurgitation   . Bronchitis   . Rhinitis   . Arthritis   . Asthma    Past Surgical History  Procedure Laterality Date  . Tonsillectomy    . Pacemaker insertion      Status post insertion  . Status post child birth x1    . Rotator cuff repair      left   Family History  Problem Relation Age of Onset  . Stroke Mother   . Heart disease Mother   . Alzheimer's disease Father   . Hypertension Sister   . Arthritis Sister   . Cancer Maternal Grandfather   . Cancer Brother    History  Substance Use Topics  . Smoking status: Never Smoker   . Smokeless tobacco: Never Used  . Alcohol Use: No   OB History    No data available     Review of Systems Per HPI with all other pertinent systems negative.   Allergies  Contrast media  Home Medications   Prior to Admission medications   Medication Sig Start Date End Date Taking? Authorizing Provider  albuterol (PROVENTIL HFA;VENTOLIN HFA) 108 (90 BASE) MCG/ACT inhaler Inhale 2 puffs into the lungs every 4 (four) hours as needed for wheezing or shortness of breath (cough, shortness of breath or wheezing.). 05/01/14   Waldemar Dickens, MD   chlorpheniramine-HYDROcodone [facility] PENNKINETIC ER) 10-8 MG/5ML LQCR Take 5 mLs by mouth every 12 (twelve) hours as needed (cough). 05/01/14   Waldemar Dickens, MD  colchicine 0.6 MG tablet Take 0.6 mg by mouth as needed.    Historical Provider, MD  cyclobenzaprine (FLEXERIL) 5 MG tablet Take 5 mg by mouth 3 (three) times daily as needed for muscle spasms.    Historical Provider, MD  diazepam (VALIUM) 10 MG tablet Take 10 mg by mouth every 6 (six) hours as needed for anxiety.    Historical Provider, MD  ipratropium (ATROVENT) 0.03 % nasal spray Place 2 sprays into the nose 2 (two) times daily. 04/25/14   Posey Boyer, MD  liothyronine (CYTOMEL) 5 MCG tablet Take 5 mcg by mouth daily.     Historical Provider, MD  metoprolol (LOPRESSOR) 50 MG tablet Take 1.5 tablets (75 mg total) by mouth 2 (two) times daily. 11/07/13   Fay Records, MD  potassium chloride (KLOR-CON) 10 MEQ CR tablet Take 10 mEq by mouth daily.      Historical Provider, MD  predniSONE (DELTASONE) 50 MG tablet Take daily with breakfast 05/01/14   Waldemar Dickens, MD  Spacer/Aero-Holding Josiah Lobo DEVI Use as directed 05/01/14   Waldemar Dickens, MD  triamterene-hydrochlorothiazide (MAXZIDE) 75-50 MG per tablet Take 1 tablet by mouth  daily.    Historical Provider, MD  warfarin (COUMADIN) 5 MG tablet Take 2.5-5 mg by mouth daily. Sun-0.5 half tab (2.5 mg total); All other days-1 tab (5 mg total) 01/04/11   Lelon Perla, MD   BP 132/87 mmHg  Pulse 86  Temp(Src) 98.3 F (36.8 C) (Oral)  Resp 16  SpO2 97% Physical Exam  Constitutional: She is oriented to person, place, and time. She appears well-developed and well-nourished.  HENT:  Head: Normocephalic and atraumatic.  Pharyngeal cobblestoning  Neck: Normal range of motion. Neck supple.  Cardiovascular: Normal rate and intact distal pulses.   No murmur heard. Pulmonary/Chest: Effort normal and breath sounds normal.  Diffuse wheezing and intermittent ronchi on R in posterior  fields  After nebulizer treatment lungs clear except for minimal wheezing in upper lung field on R.  nml wob  Abdominal: Soft. Bowel sounds are normal.  Musculoskeletal: Normal range of motion. She exhibits no tenderness.  Neurological: She is oriented to person, place, and time.  Skin: Skin is dry. No rash noted.  Psychiatric: She has a normal mood and affect. Her behavior is normal. Thought content normal.    ED Course  Procedures (including critical care time) Labs Review Labs Reviewed  CULTURE, GROUP A STREP  POCT RAPID STREP A ([facility]RG CARE ONLY)    Imaging Review No results found.   MDM   1. Asthma exacerbation   2. Febrile illness   3. Post-nasal drip   4. Cough   5. Wheezing    Solumedrol 125mg  IM and DUoneb in office w/ improvement Rapid strep neg CXR w/o evidence of PNA Rx given for Albuterol (pt nearly out at home), spacer, Prednisone 50mg  X 5 days, and tussionex.  Pt to also start OTC allergy medication and to double dose of home nasal atrovent (currently on 0.03% formulation) Precautions given and all questions answered   Linna Darner, MD Family Medicine 05/04/2014, 3:46 PM      Waldemar Dickens, MD 05/04/14 [phone]

## 2014-05-01 NOTE — ED Notes (Signed)
C/o cold sx onset 1 week Sx include: productive cough, ST, congestion, fevers, chills, wheezing, SOB Using her rescue inhaler and OTC cold meds w/no relief.  Alert, no signs of acute distress.

## 2014-05-04 LAB — CULTURE, GROUP A STREP: Strep A Culture: NEGATIVE

## 2014-05-12 DIAGNOSIS — I4891 Unspecified atrial fibrillation: Secondary | ICD-10-CM | POA: Diagnosis not present

## 2014-05-28 ENCOUNTER — Encounter: Payer: Self-pay | Admitting: Internal Medicine

## 2014-05-28 DIAGNOSIS — R001 Bradycardia, unspecified: Secondary | ICD-10-CM | POA: Diagnosis not present

## 2014-06-09 DIAGNOSIS — I4891 Unspecified atrial fibrillation: Secondary | ICD-10-CM | POA: Diagnosis not present

## 2014-06-11 DIAGNOSIS — E063 Autoimmune thyroiditis: Secondary | ICD-10-CM | POA: Diagnosis not present

## 2014-06-11 DIAGNOSIS — R739 Hyperglycemia, unspecified: Secondary | ICD-10-CM | POA: Diagnosis not present

## 2014-06-11 DIAGNOSIS — R7989 Other specified abnormal findings of blood chemistry: Secondary | ICD-10-CM | POA: Diagnosis not present

## 2014-06-11 DIAGNOSIS — E780 Pure hypercholesterolemia: Secondary | ICD-10-CM | POA: Diagnosis not present

## 2014-06-11 DIAGNOSIS — E28310 Symptomatic premature menopause: Secondary | ICD-10-CM | POA: Diagnosis not present

## 2014-06-11 DIAGNOSIS — E889 Metabolic disorder, unspecified: Secondary | ICD-10-CM | POA: Diagnosis not present

## 2014-06-11 DIAGNOSIS — E559 Vitamin D deficiency, unspecified: Secondary | ICD-10-CM | POA: Diagnosis not present

## 2014-06-17 DIAGNOSIS — R7309 Other abnormal glucose: Secondary | ICD-10-CM | POA: Diagnosis not present

## 2014-06-17 DIAGNOSIS — E039 Hypothyroidism, unspecified: Secondary | ICD-10-CM | POA: Diagnosis not present

## 2014-06-17 DIAGNOSIS — M2550 Pain in unspecified joint: Secondary | ICD-10-CM | POA: Diagnosis not present

## 2014-06-17 DIAGNOSIS — N951 Menopausal and female climacteric states: Secondary | ICD-10-CM | POA: Diagnosis not present

## 2014-07-01 DIAGNOSIS — M545 Low back pain: Secondary | ICD-10-CM | POA: Diagnosis not present

## 2014-07-01 DIAGNOSIS — M7062 Trochanteric bursitis, left hip: Secondary | ICD-10-CM | POA: Diagnosis not present

## 2014-07-07 DIAGNOSIS — I4891 Unspecified atrial fibrillation: Secondary | ICD-10-CM | POA: Diagnosis not present

## 2014-08-03 ENCOUNTER — Ambulatory Visit: Payer: Medicare Other | Admitting: Physician Assistant

## 2014-08-04 DIAGNOSIS — I4891 Unspecified atrial fibrillation: Secondary | ICD-10-CM | POA: Diagnosis not present

## 2014-08-24 ENCOUNTER — Ambulatory Visit (INDEPENDENT_AMBULATORY_CARE_PROVIDER_SITE_OTHER): Payer: Medicare Other | Admitting: Internal Medicine

## 2014-08-24 ENCOUNTER — Encounter: Payer: Self-pay | Admitting: Internal Medicine

## 2014-08-24 VITALS — BP 124/80 | HR 75 | Ht 63.0 in | Wt 241.8 lb

## 2014-08-24 DIAGNOSIS — I482 Chronic atrial fibrillation, unspecified: Secondary | ICD-10-CM

## 2014-08-24 MED ORDER — METOPROLOL TARTRATE 50 MG PO TABS
75.0000 mg | ORAL_TABLET | Freq: Two times a day (BID) | ORAL | Status: DC
Start: 2014-08-24 — End: 2015-08-20

## 2014-08-24 NOTE — Patient Instructions (Signed)
Medication Instructions:  Your physician recommends that you continue on your current medications as directed. Please refer to the Current Medication list given to you today.   Labwork: none  Testing/Procedures: none  Follow-Up: Your physician wants you to follow-up in: 12 months.  You will receive a reminder letter in the mail two months in advance. If you don't receive a letter, please call our office to schedule the follow-up appointment.    Have primary care send [place] your lab results.

## 2014-08-24 NOTE — Progress Notes (Signed)
Cardiology Office Note   Date:  08/24/2014   ID:  [name], DOB Jul 12, 1940, MRN [id number]  PCP:  No primary care provider on file.  Cardiologist:   Dorris Carnes, MD   No chief complaint on file.  F/u of afib     History of Present Illness: [name] is a 74 y.o. female with a history of  bradycardia (s/P PPM), afib, HTN, obesity, SOB Followed by Beckie Salts.  I saw the patinet in 2013 She was seen by Beckie Salts in 2014 Since she was in clinic last she has done fairly well Denies CP  Breathing ok       Current Outpatient Prescriptions  Medication Sig Dispense Refill  . acetaminophen-codeine (TYLENOL #4) 300-60 MG per tablet Take 1 tablet by mouth every 4 (four) hours as needed (pain).   3  . colchicine 0.6 MG tablet Take 0.6 mg by mouth as needed.    Marland Kitchen levothyroxine (SYNTHROID, LEVOTHROID) 50 MCG tablet Take 50 mcg by mouth every morning.  1  . liothyronine (CYTOMEL) 5 MCG tablet Take 5 mcg by mouth daily.     . metoprolol (LOPRESSOR) 50 MG tablet Take 1.5 tablets (75 mg total) by mouth 2 (two) times daily. 270 tablet 3  . potassium chloride (MICRO-K) 10 MEQ CR capsule Take 10 mEq by mouth 2 (two) times daily.  3  . triamterene-hydrochlorothiazide (MAXZIDE) 75-50 MG per tablet Take 1 tablet by mouth daily.    Marland Kitchen warfarin (COUMADIN) 5 MG tablet Take 2.5-5 mg by mouth daily. Sun-0.5 half tab (2.5 mg total); All other days-1 tab (5 mg total)     No current facility-administered medications for this visit.    Allergies:   Contrast media   Past Medical History  Diagnosis Date  . Hypothyroidism   . Hyperlipidemia   . Hypertension   . Paroxysmal atrial fibrillation   . Obesity   . Sick sinus syndrome   . Aortic insufficiency     Trivial  . Mitral regurgitation   . Bronchitis   . Rhinitis   . Arthritis   . Asthma     Past Surgical History  Procedure Laterality Date  . Tonsillectomy    . Pacemaker insertion      Status post insertion  . Status post child  birth x1    . Rotator cuff repair      left     Social History:  The patient  reports that she has never smoked. She has never used smokeless tobacco. She reports that she does not drink alcohol or use illicit drugs.   Family History:  The patient's family history includes Alzheimer's disease in her father; Arthritis in her sister; Cancer in her brother and maternal grandfather; Heart disease in her mother; Hypertension in her sister; Stroke in her mother.    ROS:  Please see the history of present illness. All other systems are reviewed and  Negative to the above problem except as noted.    PHYSICAL EXAM: VS:  BP 124/80 mmHg  Pulse 75  Ht 5\' 3"  (1.6 m)  Wt 241 lb 12.8 oz (109.68 kg)  BMI 42.84 kg/m2  GEN: Well nourished, well developed, in no acute distress HEENT: normal Neck: no JVD, carotid bruits, or masses Cardiac: RRR; no murmurs, rubs, or gallops,no edema  Respiratory:  clear to auscultation bilaterally, normal work of breathing GI: soft, nontender, nondistended, + BS  No hepatomegaly  MS: no deformity Moving all extremities   Skin: warm  and dry, no rash Neuro:  Strength and sensation are intact Psych: euthymic mood, full affect   EKG:  EKG is ordered today.  Atrial fib 75  Occasional ventricular paced.     Lipid Panel    Component Value Date/Time   CHOL 208* 11/07/2013 1424   TRIG 142.0 11/07/2013 1424   HDL 50.00 11/07/2013 1424   CHOLHDL 4 11/07/2013 1424   VLDL 28.4 11/07/2013 1424   LDLCALC 130* 11/07/2013 1424      Wt Readings from Last 3 Encounters:  08/24/14 241 lb 12.8 oz (109.68 kg)  04/25/14 249 lb (112.946 kg)  11/26/13 242 lb 12.8 oz (110.133 kg)      ASSESSMENT AND PLAN:  1.  Atrial fib  COntinue on current regimen of rate control and coumadin    2.  Hx PPM  F/U with Beckie Salts in December.  3.  HTN  Adequate control  Check BMET, Thryod and CBC   F/U in 1 year    Signed, Dorris Carnes, MD  08/24/2014 4:40 PM    [facility] [place], [place], [place]  [address] Phone: [phone]; Fax: [phone]

## 2014-08-27 ENCOUNTER — Encounter: Payer: Self-pay | Admitting: Internal Medicine

## 2014-08-27 DIAGNOSIS — I495 Sick sinus syndrome: Secondary | ICD-10-CM | POA: Diagnosis not present

## 2014-08-27 DIAGNOSIS — R001 Bradycardia, unspecified: Secondary | ICD-10-CM | POA: Diagnosis not present

## 2014-09-01 DIAGNOSIS — I4891 Unspecified atrial fibrillation: Secondary | ICD-10-CM | POA: Diagnosis not present

## 2014-09-16 DIAGNOSIS — E780 Pure hypercholesterolemia: Secondary | ICD-10-CM | POA: Diagnosis not present

## 2014-09-16 DIAGNOSIS — E063 Autoimmune thyroiditis: Secondary | ICD-10-CM | POA: Diagnosis not present

## 2014-09-16 DIAGNOSIS — N951 Menopausal and female climacteric states: Secondary | ICD-10-CM | POA: Diagnosis not present

## 2014-09-16 DIAGNOSIS — E559 Vitamin D deficiency, unspecified: Secondary | ICD-10-CM | POA: Diagnosis not present

## 2014-09-16 DIAGNOSIS — R7989 Other specified abnormal findings of blood chemistry: Secondary | ICD-10-CM | POA: Diagnosis not present

## 2014-09-16 DIAGNOSIS — E889 Metabolic disorder, unspecified: Secondary | ICD-10-CM | POA: Diagnosis not present

## 2014-09-16 DIAGNOSIS — E28310 Symptomatic premature menopause: Secondary | ICD-10-CM | POA: Diagnosis not present

## 2014-09-16 DIAGNOSIS — R739 Hyperglycemia, unspecified: Secondary | ICD-10-CM | POA: Diagnosis not present

## 2014-09-22 ENCOUNTER — Encounter: Payer: Self-pay | Admitting: Internal Medicine

## 2014-09-22 DIAGNOSIS — N951 Menopausal and female climacteric states: Secondary | ICD-10-CM | POA: Diagnosis not present

## 2014-09-29 DIAGNOSIS — I4891 Unspecified atrial fibrillation: Secondary | ICD-10-CM | POA: Diagnosis not present

## 2014-10-27 DIAGNOSIS — I4891 Unspecified atrial fibrillation: Secondary | ICD-10-CM | POA: Diagnosis not present

## 2014-11-24 DIAGNOSIS — I4891 Unspecified atrial fibrillation: Secondary | ICD-10-CM | POA: Diagnosis not present

## 2014-11-26 ENCOUNTER — Encounter: Payer: Self-pay | Admitting: Internal Medicine

## 2014-11-26 ENCOUNTER — Telehealth: Payer: Self-pay | Admitting: Cardiology

## 2014-11-26 DIAGNOSIS — R001 Bradycardia, unspecified: Secondary | ICD-10-CM | POA: Diagnosis not present

## 2014-11-26 DIAGNOSIS — I482 Chronic atrial fibrillation: Secondary | ICD-10-CM | POA: Diagnosis not present

## 2014-11-26 NOTE — Telephone Encounter (Signed)
[name] with heartcare called and stated pt is oversensing and she is going to fax the report.

## 2014-11-26 NOTE — Telephone Encounter (Signed)
Placed for review in Dr. Tanna Furry red folder by Juanda Crumble.

## 2014-12-01 ENCOUNTER — Encounter: Payer: Self-pay | Admitting: *Deleted

## 2014-12-22 DIAGNOSIS — I4891 Unspecified atrial fibrillation: Secondary | ICD-10-CM | POA: Diagnosis not present

## 2015-01-19 DIAGNOSIS — I4891 Unspecified atrial fibrillation: Secondary | ICD-10-CM | POA: Diagnosis not present

## 2015-02-16 ENCOUNTER — Encounter: Payer: Self-pay | Admitting: Internal Medicine

## 2015-02-16 ENCOUNTER — Ambulatory Visit (INDEPENDENT_AMBULATORY_CARE_PROVIDER_SITE_OTHER): Payer: Medicare Other | Admitting: Internal Medicine

## 2015-02-16 VITALS — BP 124/82 | HR 73 | Ht 61.0 in | Wt 235.2 lb

## 2015-02-16 DIAGNOSIS — I4891 Unspecified atrial fibrillation: Secondary | ICD-10-CM | POA: Insufficient documentation

## 2015-02-16 DIAGNOSIS — I10 Essential (primary) hypertension: Secondary | ICD-10-CM

## 2015-02-16 DIAGNOSIS — I482 Chronic atrial fibrillation, unspecified: Secondary | ICD-10-CM

## 2015-02-16 DIAGNOSIS — Z950 Presence of cardiac pacemaker: Secondary | ICD-10-CM

## 2015-02-16 LAB — CUP PACEART INCLINIC DEVICE CHECK
Battery Remaining Longevity: 45 mo
Battery Voltage: 2.76 V
Brady Statistic RV Percent Paced: 64 %
Implantable Lead Implant Date: 20090317
Implantable Lead Location: 753859
Implantable Lead Location: 753860
Lead Channel Pacing Threshold Amplitude: 0.75 V
Lead Channel Setting Pacing Amplitude: 2.5 V
Lead Channel Setting Pacing Pulse Width: 0.4 ms
Lead Channel Setting Sensing Sensitivity: 2 mV
MDC IDC LEAD IMPLANT DT: 20090317
MDC IDC MSMT LEADCHNL RV IMPEDANCE VALUE: 489 Ohm
MDC IDC MSMT LEADCHNL RV PACING THRESHOLD PULSEWIDTH: 0.4 ms
MDC IDC MSMT LEADCHNL RV SENSING INTR AMPL: 12 mV
MDC IDC SESS DTM: 20161220112147
Pulse Gen Model: 5826
Pulse Gen Serial Number: 2056081

## 2015-02-16 NOTE — Assessment & Plan Note (Signed)
Her blood pressure is controlled. She is encouraged to lose weight.

## 2015-02-16 NOTE — Assessment & Plan Note (Signed)
Her ventricular rate is well controlled. No change in meds. 

## 2015-02-16 NOTE — Progress Notes (Signed)
HPI [name] returns today for followup. She is a very pleasant 74 year old woman with a history of hypertension, symptomatic bradycardia, chronic atrial fibrillation, status post permanent pacemaker insertion. She remains morbidly obese. The patient notes occasional mild peripheral edema. She denies chest pain or shortness of breath. No recent falls. She does have arthritis. She has minimal palpitations. In the interim, she has not been in the hospital. Allergies  Allergen Reactions  . Contrast Media [Iodinated Diagnostic Agents] Rash    Contrast dye causes swelling     Current Outpatient Prescriptions  Medication Sig Dispense Refill  . acetaminophen-codeine (TYLENOL #4) 300-60 MG per tablet Take 1 tablet by mouth every 4 (four) hours as needed (pain).   3  . colchicine 0.6 MG tablet Take 0.6 mg by mouth daily as needed (GOUT).     Marland Kitchen levothyroxine (SYNTHROID, LEVOTHROID) 50 MCG tablet Take 50 mcg by mouth every morning.  1  . liothyronine (CYTOMEL) 5 MCG tablet Take 5 mcg by mouth daily.     . metoprolol (LOPRESSOR) 50 MG tablet Take 1.5 tablets (75 mg total) by mouth 2 (two) times daily. 270 tablet 3  . potassium chloride (MICRO-K) 10 MEQ CR capsule Take 10 mEq by mouth 2 (two) times daily.  3  . triamterene-hydrochlorothiazide (MAXZIDE) 75-50 MG per tablet Take 1 tablet by mouth daily.    Marland Kitchen warfarin (COUMADIN) 5 MG tablet Take 2.5-5 mg by mouth daily. Sun-0.5 half tab (2.5 mg total); All other days-1 tab (5 mg total)     No current facility-administered medications for this visit.     Past Medical History  Diagnosis Date  . Hypothyroidism   . Hyperlipidemia   . Hypertension   . Paroxysmal atrial fibrillation (HCC)   . Obesity   . Sick sinus syndrome ([place])   . Aortic insufficiency     Trivial  . Mitral regurgitation   . Bronchitis   . Rhinitis   . Arthritis   . Asthma     ROS:   All systems reviewed and negative except as noted in the HPI.   Past Surgical History   Procedure Laterality Date  . Tonsillectomy    . Pacemaker insertion      Status post insertion  . Status post child birth x1    . Rotator cuff repair      left     Family History  Problem Relation Age of Onset  . Stroke Mother   . Heart disease Mother   . Alzheimer's disease Father   . Hypertension Sister   . Arthritis Sister   . Cancer Maternal Grandfather   . Cancer Brother      Social History   Social History  . Marital Status: Married    Spouse Name: N/A  . Number of Children: 1  . Years of Education: N/A   Occupational History  . Data entry clerk    Social History Main Topics  . Smoking status: Never Smoker   . Smokeless tobacco: Never Used  . Alcohol Use: No  . Drug Use: No  . Sexual Activity: No   Other Topics Concern  . Not on file   Social History Narrative    Blood pressure 126/72, pulse 89, weight 242 pounds, height 5 foot 2 inches  Physical Exam:  obese appearing 74 year old woman, NAD HEENT: Unremarkable Neck:  No JVD, no thyromegally Lungs:  Clear with no wheezes, rales, or rhonchi. Well-healed pacemaker incision. HEART:  IRegular rate rhythm, no murmurs, no rubs,  no clicks Abd:  soft, obese, positive bowel sounds, no organomegally, no rebound, no guarding Ext:  2 plus pulses, trace edema, no cyanosis, no clubbing Skin:  No rashes no nodules Neuro:  CN II through XII intact, motor grossly intact  DEVICE  Normal device function.  See PaceArt for details.   Assess/Plan:

## 2015-02-16 NOTE — Patient Instructions (Signed)

## 2015-02-16 NOTE — Assessment & Plan Note (Signed)
Her St. Jude dual chamber PM is working normally. She remains programmed VVI.

## 2015-03-16 DIAGNOSIS — I4891 Unspecified atrial fibrillation: Secondary | ICD-10-CM | POA: Diagnosis not present

## 2015-03-23 DIAGNOSIS — E569 Vitamin deficiency, unspecified: Secondary | ICD-10-CM | POA: Diagnosis not present

## 2015-03-23 DIAGNOSIS — E559 Vitamin D deficiency, unspecified: Secondary | ICD-10-CM | POA: Diagnosis not present

## 2015-03-23 DIAGNOSIS — D510 Vitamin B12 deficiency anemia due to intrinsic factor deficiency: Secondary | ICD-10-CM | POA: Diagnosis not present

## 2015-03-23 DIAGNOSIS — R5382 Chronic fatigue, unspecified: Secondary | ICD-10-CM | POA: Diagnosis not present

## 2015-03-23 DIAGNOSIS — E039 Hypothyroidism, unspecified: Secondary | ICD-10-CM | POA: Diagnosis not present

## 2015-03-23 DIAGNOSIS — R799 Abnormal finding of blood chemistry, unspecified: Secondary | ICD-10-CM | POA: Diagnosis not present

## 2015-04-06 ENCOUNTER — Other Ambulatory Visit: Payer: Self-pay | Admitting: Pain Medicine

## 2015-04-06 DIAGNOSIS — N939 Abnormal uterine and vaginal bleeding, unspecified: Secondary | ICD-10-CM

## 2015-04-09 ENCOUNTER — Ambulatory Visit
Admission: RE | Admit: 2015-04-09 | Discharge: 2015-04-09 | Disposition: A | Discharge status: Home / Self Care | Payer: Medicare Other | Source: Ambulatory Visit | Attending: Pain Medicine | Admitting: Pain Medicine

## 2015-04-09 DIAGNOSIS — N939 Abnormal uterine and vaginal bleeding, unspecified: Secondary | ICD-10-CM

## 2015-04-09 DIAGNOSIS — N950 Postmenopausal bleeding: Secondary | ICD-10-CM | POA: Diagnosis not present

## 2015-04-13 DIAGNOSIS — I4891 Unspecified atrial fibrillation: Secondary | ICD-10-CM | POA: Diagnosis not present

## 2015-04-21 DIAGNOSIS — N950 Postmenopausal bleeding: Secondary | ICD-10-CM | POA: Diagnosis not present

## 2015-04-27 DIAGNOSIS — E039 Hypothyroidism, unspecified: Secondary | ICD-10-CM | POA: Diagnosis not present

## 2015-05-11 DIAGNOSIS — I4891 Unspecified atrial fibrillation: Secondary | ICD-10-CM | POA: Diagnosis not present

## 2015-05-25 ENCOUNTER — Encounter: Payer: Self-pay | Admitting: Internal Medicine

## 2015-05-25 DIAGNOSIS — R001 Bradycardia, unspecified: Secondary | ICD-10-CM | POA: Diagnosis not present

## 2015-06-08 DIAGNOSIS — I4891 Unspecified atrial fibrillation: Secondary | ICD-10-CM | POA: Diagnosis not present

## 2015-06-30 DIAGNOSIS — N8500 Endometrial hyperplasia, unspecified: Secondary | ICD-10-CM | POA: Diagnosis not present

## 2015-07-06 DIAGNOSIS — I4891 Unspecified atrial fibrillation: Secondary | ICD-10-CM | POA: Diagnosis not present

## 2015-07-07 DIAGNOSIS — E569 Vitamin deficiency, unspecified: Secondary | ICD-10-CM | POA: Diagnosis not present

## 2015-07-07 DIAGNOSIS — E28310 Symptomatic premature menopause: Secondary | ICD-10-CM | POA: Diagnosis not present

## 2015-07-07 DIAGNOSIS — E559 Vitamin D deficiency, unspecified: Secondary | ICD-10-CM | POA: Diagnosis not present

## 2015-07-07 DIAGNOSIS — E039 Hypothyroidism, unspecified: Secondary | ICD-10-CM | POA: Diagnosis not present

## 2015-07-07 DIAGNOSIS — R799 Abnormal finding of blood chemistry, unspecified: Secondary | ICD-10-CM | POA: Diagnosis not present

## 2015-07-07 DIAGNOSIS — D510 Vitamin B12 deficiency anemia due to intrinsic factor deficiency: Secondary | ICD-10-CM | POA: Diagnosis not present

## 2015-07-07 DIAGNOSIS — R5382 Chronic fatigue, unspecified: Secondary | ICD-10-CM | POA: Diagnosis not present

## 2015-07-08 ENCOUNTER — Ambulatory Visit: Payer: Medicare Other | Admitting: Pain Medicine

## 2015-07-14 DIAGNOSIS — M2550 Pain in unspecified joint: Secondary | ICD-10-CM | POA: Diagnosis not present

## 2015-07-14 DIAGNOSIS — A6920 Lyme disease, unspecified: Secondary | ICD-10-CM | POA: Diagnosis not present

## 2015-07-20 NOTE — H&P (Signed)
[name] is an 75 y.o. female. She presents for endometrial polypectomy  Pertinent Gynecological History: Menses: post-menopausal Bleeding: post menopausal bleeding Last mammogram: normal Date: 2012 Last pap: normal  OB History: G1, P1      Past Medical History  Diagnosis Date  . Hypothyroidism   . Hyperlipidemia   . Hypertension   . Paroxysmal atrial fibrillation (HCC)   . Obesity   . Sick sinus syndrome ([place])   . Aortic insufficiency     Trivial  . Mitral regurgitation   . Bronchitis   . Rhinitis   . Arthritis   . Asthma     Past Surgical History  Procedure Laterality Date  . Tonsillectomy    . Pacemaker insertion      Status post insertion  . Status post child birth x1    . Rotator cuff repair      left    Family History  Problem Relation Age of Onset  . Stroke Mother   . Heart disease Mother   . Alzheimer's disease Father   . Hypertension Sister   . Arthritis Sister   . Cancer Maternal Grandfather   . Cancer Brother     Social History:  reports that she has never smoked. She has never used smokeless tobacco. She reports that she does not drink alcohol or use illicit drugs.  Allergies:  Allergies  Allergen Reactions  . Contrast Media [Iodinated Diagnostic Agents] Rash    Contrast dye causes swelling    No prescriptions prior to admission    Review of Systems  Constitutional: Negative.   HENT: Negative.   Respiratory: Negative.   Cardiovascular: Negative.   Genitourinary: Negative.     There were no vitals taken for this visit. Physical Exam  Constitutional: She is oriented to person, place, and time.  Morbidly Obese  Eyes: Pupils are equal, round, and reactive to light.  Neck: Neck supple.  GI: Soft.  Genitourinary: Vagina normal and uterus normal.  Neurological: She is alert and oriented to person, place, and time.  Skin: Skin is warm and dry.  Psychiatric: She has a normal mood and affect.    Office endometrial biopsy Feb.  2017 showed scant endometrium. Office saline infusion sonogram on 06/30/2015 showed a 1.3 cm endometrial polyp in the lower uterine segment.  Assessment/Plan: Post menopausal bleeding from a suspected endometrial polyp.  Will proceed with hysteroscopy and polypectomy to rule out occult endometrial cancer.  [name] D 07/20/2015, 11:47 AM

## 2015-07-22 NOTE — Patient Instructions (Addendum)
Your procedure is scheduled on:  Tuesday, August 03, 2015  Enter through the [employer] [facility] at:  8:30 AM  Pick up the phone at the desk and dial [phone].  Call this number if you have problems the morning of surgery: [phone].  Remember: Do NOT eat food or drink after:  Midnight Monday, August 02, 2015  Take these medicines the morning of surgery with a SIP OF WATER:  Levothyroxine, Liothyronine, Metoprolol, Potassium, Triamterene, Diazepam if needed  Do NOT wear jewelry (body piercing), metal hair clips/bobby pins, make-up, or nail polish. Do NOT wear lotions, powders, or perfumes.  You may wear deodorant. Do NOT shave for 48 hours prior to surgery. Do NOT bring valuables to the hospital. Contacts, dentures, or bridgework may not be worn into surgery. Have a responsible adult drive you home and stay with you for 24 hours after your procedure

## 2015-07-23 ENCOUNTER — Encounter (HOSPITAL_COMMUNITY): Payer: Self-pay

## 2015-07-23 ENCOUNTER — Encounter (HOSPITAL_COMMUNITY)
Admission: RE | Admit: 2015-07-23 | Discharge: 2015-07-23 | Disposition: A | Discharge status: Home / Self Care | Payer: Medicare Other | Source: Ambulatory Visit | Attending: Obstetrics & Gynecology | Admitting: Obstetrics & Gynecology

## 2015-07-23 DIAGNOSIS — Z01812 Encounter for preprocedural laboratory examination: Secondary | ICD-10-CM | POA: Insufficient documentation

## 2015-07-23 DIAGNOSIS — N938 Other specified abnormal uterine and vaginal bleeding: Secondary | ICD-10-CM | POA: Diagnosis not present

## 2015-07-23 HISTORY — DX: Post-traumatic stress disorder, unspecified: F43.10

## 2015-07-23 HISTORY — DX: Other bursitis of hip, unspecified hip: M70.70

## 2015-07-23 HISTORY — DX: Sleep apnea, unspecified: G47.30

## 2015-07-23 HISTORY — DX: Anxiety disorder, unspecified: F41.9

## 2015-07-23 HISTORY — DX: Gastro-esophageal reflux disease without esophagitis: K21.9

## 2015-07-23 HISTORY — DX: Claustrophobia: F40.240

## 2015-07-23 LAB — CBC
HEMATOCRIT: 43 % (ref 36.0–46.0)
Hemoglobin: 14.8 g/dL (ref 12.0–15.0)
MCH: 30.6 pg (ref 26.0–34.0)
MCHC: 34.4 g/dL (ref 30.0–36.0)
MCV: 89 fL (ref 78.0–100.0)
Platelets: 181 10*3/uL (ref 150–400)
RBC: 4.83 MIL/uL (ref 3.87–5.11)
RDW: 14 % (ref 11.5–15.5)
WBC: 6 10*3/uL (ref 4.0–10.5)

## 2015-07-23 LAB — BASIC METABOLIC PANEL
ANION GAP: 9 (ref 5–15)
BUN: 37 mg/dL — ABNORMAL HIGH (ref 6–20)
CO2: 28 mmol/L (ref 22–32)
Calcium: 10.2 mg/dL (ref 8.9–10.3)
Chloride: 99 mmol/L — ABNORMAL LOW (ref 101–111)
Creatinine, Ser: 1.14 mg/dL — ABNORMAL HIGH (ref 0.44–1.00)
GFR calc Af Amer: 53 mL/min — ABNORMAL LOW (ref >60)
GFR, EST NON AFRICAN AMERICAN: 46 mL/min — AB (ref >60)
GLUCOSE: 109 mg/dL — AB (ref 65–99)
POTASSIUM: 4.1 mmol/L (ref 3.5–5.1)
Sodium: 136 mmol/L (ref 135–145)

## 2015-07-23 LAB — PROTIME-INR
INR: 1.87 — ABNORMAL HIGH (ref 0.00–1.49)
Prothrombin Time: 21.5 seconds — ABNORMAL HIGH (ref 11.6–15.2)

## 2015-07-23 LAB — APTT: APTT: 34 s (ref 24–37)

## 2015-07-28 ENCOUNTER — Telehealth: Payer: Self-pay | Admitting: Internal Medicine

## 2015-07-28 NOTE — Telephone Encounter (Signed)
Left message on machine for pre-op to call device clinic if call is in regards to her device

## 2015-07-28 NOTE — Telephone Encounter (Signed)
New Message:   Please call,concerning the patient [name]

## 2015-07-30 ENCOUNTER — Telehealth: Payer: Self-pay | Admitting: Internal Medicine

## 2015-07-30 NOTE — Progress Notes (Signed)
Left message for [name], that we received pacemaker orders but, are still waiting the cardiac clearance from Dr. Lovena Le.

## 2015-07-30 NOTE — Telephone Encounter (Signed)
Reviewed with Dr Lovena Le.  Patient low risk for surgery and may proceed.  Will fax

## 2015-07-30 NOTE — Telephone Encounter (Signed)
New message    Request for surgical clearance:  1. What type of surgery is being performed? DNC, Hysteroscopy, Endometrial polypectomy   2. When is this surgery scheduled? 6.6.2017   3. Are there any medications that need to be held prior to surgery and how long? No   4. Name of physician performing surgery? Dr. Alden Hipp     5. What is your office phone and fax number? Fax # [phone]

## 2015-08-03 ENCOUNTER — Ambulatory Visit (HOSPITAL_COMMUNITY): Payer: Medicare Other | Admitting: Anesthesiology

## 2015-08-03 ENCOUNTER — Ambulatory Visit (HOSPITAL_COMMUNITY)
Admission: RE | Admit: 2015-08-03 | Discharge: 2015-08-03 | Disposition: A | Discharge status: Home / Self Care | Payer: Medicare Other | Source: Ambulatory Visit | Attending: Obstetrics & Gynecology | Admitting: Obstetrics & Gynecology

## 2015-08-03 ENCOUNTER — Encounter (HOSPITAL_COMMUNITY): Payer: Self-pay | Admitting: Anesthesiology

## 2015-08-03 ENCOUNTER — Encounter (HOSPITAL_COMMUNITY)
Admission: RE | Disposition: A | Discharge status: Home / Self Care | Payer: Self-pay | Source: Ambulatory Visit | Attending: Obstetrics & Gynecology

## 2015-08-03 DIAGNOSIS — Z950 Presence of cardiac pacemaker: Secondary | ICD-10-CM | POA: Diagnosis not present

## 2015-08-03 DIAGNOSIS — I10 Essential (primary) hypertension: Secondary | ICD-10-CM | POA: Diagnosis not present

## 2015-08-03 DIAGNOSIS — N840 Polyp of corpus uteri: Secondary | ICD-10-CM | POA: Insufficient documentation

## 2015-08-03 DIAGNOSIS — N950 Postmenopausal bleeding: Secondary | ICD-10-CM | POA: Insufficient documentation

## 2015-08-03 DIAGNOSIS — M1990 Unspecified osteoarthritis, unspecified site: Secondary | ICD-10-CM | POA: Diagnosis not present

## 2015-08-03 DIAGNOSIS — R938 Abnormal findings on diagnostic imaging of other specified body structures: Secondary | ICD-10-CM | POA: Insufficient documentation

## 2015-08-03 DIAGNOSIS — G4730 Sleep apnea, unspecified: Secondary | ICD-10-CM | POA: Diagnosis not present

## 2015-08-03 DIAGNOSIS — I480 Paroxysmal atrial fibrillation: Secondary | ICD-10-CM | POA: Diagnosis not present

## 2015-08-03 DIAGNOSIS — Z6841 Body Mass Index (BMI) 40.0 and over, adult: Secondary | ICD-10-CM | POA: Diagnosis not present

## 2015-08-03 DIAGNOSIS — I4891 Unspecified atrial fibrillation: Secondary | ICD-10-CM | POA: Diagnosis not present

## 2015-08-03 DIAGNOSIS — E039 Hypothyroidism, unspecified: Secondary | ICD-10-CM | POA: Diagnosis not present

## 2015-08-03 HISTORY — PX: DILATATION & CURETTAGE/HYSTEROSCOPY WITH MYOSURE: SHX6511

## 2015-08-03 SURGERY — DILATATION & CURETTAGE/HYSTEROSCOPY WITH MYOSURE
Anesthesia: General

## 2015-08-03 MED ORDER — KETOROLAC TROMETHAMINE 30 MG/ML IJ SOLN
INTRAMUSCULAR | Status: AC
  Filled 2015-08-03: qty 1

## 2015-08-03 MED ORDER — FENTANYL CITRATE (PF) 100 MCG/2ML IJ SOLN: 25.0000 ug | INTRAMUSCULAR | Status: DC | PRN

## 2015-08-03 MED ORDER — ONDANSETRON HCL 4 MG/2ML IJ SOLN
INTRAMUSCULAR | Status: DC | PRN
  Administered 2015-08-03: 4 mg via INTRAVENOUS

## 2015-08-03 MED ORDER — DIPHENHYDRAMINE HCL 50 MG/ML IJ SOLN
INTRAMUSCULAR | Status: DC | PRN
  Administered 2015-08-03 (×2): 12.5 mg via INTRAVENOUS

## 2015-08-03 MED ORDER — LIDOCAINE HCL 2 % IJ SOLN
INTRAMUSCULAR | Status: DC | PRN
  Administered 2015-08-03: 20 mL

## 2015-08-03 MED ORDER — SODIUM CHLORIDE 0.9 % IR SOLN
Status: DC | PRN
  Administered 2015-08-03: 3000 mL

## 2015-08-03 MED ORDER — LIDOCAINE HCL (CARDIAC) 20 MG/ML IV SOLN
INTRAVENOUS | Status: DC | PRN
  Administered 2015-08-03: 60 mg via INTRAVENOUS

## 2015-08-03 MED ORDER — DEXAMETHASONE SODIUM PHOSPHATE 4 MG/ML IJ SOLN
INTRAMUSCULAR | Status: AC
  Filled 2015-08-03: qty 1

## 2015-08-03 MED ORDER — LACTATED RINGERS IV SOLN
INTRAVENOUS | Status: DC
  Administered 2015-08-03: 09:00:00 via INTRAVENOUS

## 2015-08-03 MED ORDER — PROPOFOL 10 MG/ML IV BOLUS
INTRAVENOUS | Status: AC
  Filled 2015-08-03: qty 20

## 2015-08-03 MED ORDER — SODIUM CHLORIDE 0.9 % IJ SOLN
INTRAMUSCULAR | Status: AC
  Filled 2015-08-03: qty 50

## 2015-08-03 MED ORDER — FENTANYL CITRATE (PF) 100 MCG/2ML IJ SOLN
INTRAMUSCULAR | Status: DC | PRN
  Administered 2015-08-03 (×2): 50 ug via INTRAVENOUS

## 2015-08-03 MED ORDER — HYDROCODONE-ACETAMINOPHEN 7.5-325 MG PO TABS: 1.0000 | ORAL_TABLET | Freq: Once | ORAL | Status: DC | PRN

## 2015-08-03 MED ORDER — FENTANYL CITRATE (PF) 100 MCG/2ML IJ SOLN
INTRAMUSCULAR | Status: AC
  Filled 2015-08-03: qty 2

## 2015-08-03 MED ORDER — DEXAMETHASONE SODIUM PHOSPHATE 10 MG/ML IJ SOLN
INTRAMUSCULAR | Status: DC | PRN
  Administered 2015-08-03: 4 mg via INTRAVENOUS

## 2015-08-03 MED ORDER — SODIUM CHLORIDE 0.9 % IJ SOLN
INTRAMUSCULAR | Status: AC
  Filled 2015-08-03: qty 10

## 2015-08-03 MED ORDER — LIDOCAINE HCL 2 % IJ SOLN
INTRAMUSCULAR | Status: AC
  Filled 2015-08-03: qty 20

## 2015-08-03 MED ORDER — LIDOCAINE HCL (CARDIAC) 20 MG/ML IV SOLN
INTRAVENOUS | Status: AC
  Filled 2015-08-03: qty 5

## 2015-08-03 MED ORDER — ONDANSETRON HCL 4 MG/2ML IJ SOLN: 4.0000 mg | Freq: Once | INTRAMUSCULAR | Status: DC | PRN

## 2015-08-03 MED ORDER — VASOPRESSIN 20 UNIT/ML IV SOLN
INTRAVENOUS | Status: AC
  Filled 2015-08-03: qty 1

## 2015-08-03 MED ORDER — ONDANSETRON HCL 4 MG/2ML IJ SOLN
INTRAMUSCULAR | Status: AC
  Filled 2015-08-03: qty 2

## 2015-08-03 MED ORDER — MEPERIDINE HCL 25 MG/ML IJ SOLN: 6.2500 mg | INTRAMUSCULAR | Status: DC | PRN

## 2015-08-03 MED ORDER — PROPOFOL 10 MG/ML IV BOLUS
INTRAVENOUS | Status: DC | PRN
  Administered 2015-08-03: 150 mg via INTRAVENOUS

## 2015-08-03 SURGICAL SUPPLY — 20 items
CANISTER SUCT 3000ML (MISCELLANEOUS) ×3 IMPLANT
CATH ROBINSON RED A/P 16FR (CATHETERS) ×3 IMPLANT
CLOTH BEACON ORANGE TIMEOUT ST (SAFETY) ×3 IMPLANT
CONTAINER PREFILL 10% NBF 60ML (FORM) ×6 IMPLANT
DEVICE MYOSURE LITE (MISCELLANEOUS) ×3 IMPLANT
DEVICE MYOSURE REACH (MISCELLANEOUS) IMPLANT
ELECT REM PT RETURN 9FT ADLT (ELECTROSURGICAL)
ELECTRODE REM PT RTRN 9FT ADLT (ELECTROSURGICAL) IMPLANT
FILTER ARTHROSCOPY CONVERTOR (FILTER) ×3 IMPLANT
GLOVE BIOGEL PI IND STRL 7.0 (GLOVE) ×1 IMPLANT
GLOVE BIOGEL PI INDICATOR 7.0 (GLOVE) ×2
GLOVE ECLIPSE 6.0 STRL STRAW (GLOVE) ×3 IMPLANT
GOWN STRL REUS W/TWL LRG LVL3 (GOWN DISPOSABLE) ×6 IMPLANT
PACK VAGINAL MINOR WOMEN LF (CUSTOM PROCEDURE TRAY) ×3 IMPLANT
PAD OB MATERNITY 4.3X12.25 (PERSONAL CARE ITEMS) ×3 IMPLANT
SEAL ROD LENS SCOPE MYOSURE (ABLATOR) ×3 IMPLANT
TOWEL OR 17X24 6PK STRL BLUE (TOWEL DISPOSABLE) ×6 IMPLANT
TUBING AQUILEX INFLOW (TUBING) ×3 IMPLANT
TUBING AQUILEX OUTFLOW (TUBING) ×3 IMPLANT
WATER STERILE IRR 1000ML POUR (IV SOLUTION) ×3 IMPLANT

## 2015-08-03 NOTE — Anesthesia Preprocedure Evaluation (Addendum)
Anesthesia Evaluation  Patient identified by MRN, date of birth, ID band Patient awake    Reviewed: Allergy & Precautions, H&P , NPO status , Patient's Chart, lab work & pertinent test results, reviewed documented beta blocker date and time   Airway Mallampati: II  TM Distance: >3 FB Neck ROM: full    Dental no notable dental hx.    Pulmonary sleep apnea ,    Pulmonary exam normal        Cardiovascular Exercise Tolerance: Good hypertension, Pt. on home beta blockers and Pt. on medications negative cardio ROS Normal cardiovascular exam     Neuro/Psych negative neurological ROS     GI/Hepatic negative GI ROS, Neg liver ROS,   Endo/Other  Hypothyroidism Morbid obesity  Renal/GU negative Renal ROS  negative genitourinary   Musculoskeletal   Abdominal (+) + obese,   Peds  Hematology negative hematology ROS (+)   Anesthesia Other Findings   Reproductive/Obstetrics negative OB ROS                            Anesthesia Physical Anesthesia Plan  ASA: III  Anesthesia Plan: General   Post-op Pain Management:    Induction: Intravenous  Airway Management Planned: LMA  Additional Equipment:   Intra-op Plan:   Post-operative Plan:   Informed Consent: I have reviewed the patients History and Physical, chart, labs and discussed the procedure including the risks, benefits and alternatives for the proposed anesthesia with the patient or authorized representative who has indicated his/her understanding and acceptance.     Plan Discussed with: CRNA and Surgeon  Anesthesia Plan Comments:         Anesthesia Quick Evaluation

## 2015-08-03 NOTE — Transfer of Care (Signed)
Immediate Anesthesia Transfer of Care Note  Patient: [name]  Procedure(s) Performed: Procedure(s) with comments: DILATATION & CURETTAGE/HYSTEROSCOPY WITH MYOSURE (N/A) - Patient has an ICD   Patient Location: PACU  Anesthesia Type:General  Level of Consciousness: awake, alert , oriented and patient cooperative  Airway & Oxygen Therapy: Patient Spontanous Breathing and Patient connected to nasal cannula oxygen  Post-op Assessment: Report given to RN and Post -op Vital signs reviewed and stable  Post vital signs: Reviewed and stable  Last Vitals:  Filed Vitals:   08/03/15 0839  BP: 152/91  Pulse: 77  Temp: 36.2 C  Resp: 18    Last Pain: There were no vitals filed for this visit.    Patients Stated Pain Goal: 3 ([id number])  Complications: No apparent anesthesia complications

## 2015-08-03 NOTE — Op Note (Signed)
Patient Name: [name] MRN: [id number]  Date of Surgery: 08/03/2015    PREOPERATIVE DIAGNOSIS: THICKENED ENDOMETRIUM POLYP  POSTOPERATIVE DIAGNOSIS: THICKENED ENDOMETRIUM POLYP   PROCEDURE: Hysteroscopy with directed myosure endometrial biopsies.  SURGEON: Courney Garrod D. Deatra Ina M.D.  ANESTHESIA: General  ESTIMATED BLOOD LOSS: Minimal  FINDINGS: Normal appearing endometrial cavity with homogenous atrophic endometrium. Small area of thin synechia in lower uterine segment were present.   INDICATIONS: Post menopausal bleeding and the appearance of an endometrial mass on office saline infusion sonography  PROCEDURE IN DETAIL: The patient was taken to the OR and placed in the dors-lithotomy position. The perineum and vagina were prepped and draped in a sterile fashion. Bimanual exam revealed an anteverted,normal sized uterus. 10 ml of 2% lidocaine was infiltrated in the paracervical tissue and Pratt dilators were used to open the cervix to 11 [place]. The myosure hysteroscope was introduced and the endometrial cavity was inspected.  Both ostia were seen and the endometrium was homogenous and atrophic.  In the lower segment there were thin synechia which were the source of the mass effect seen on sonohysterogram.  Using the [facility] instrument the synechia was removed.  The endometrium was sampled as well and all tissue sent to pathology. The procedure was then terminated and the patient left the operating room in good condition.

## 2015-08-03 NOTE — Anesthesia Procedure Notes (Signed)
Procedure Name: Intubation Date/Time: 08/03/2015 10:17 AM Performed by: Tobin Chad Pre-anesthesia Checklist: Patient identified, Timeout performed, Suction available and Patient being monitored Patient Re-evaluated:Patient Re-evaluated prior to inductionOxygen Delivery Method: Circle system utilized and Simple face mask Preoxygenation: Pre-oxygenation with 100% oxygen Intubation Type: IV induction LMA: LMA inserted LMA Size: 4.0 Grade View: Grade II Number of attempts: 1 Placement Confirmation: positive ETCO2 and breath sounds checked- equal and bilateral Tube secured with: Tape Dental Injury: Teeth and Oropharynx as per pre-operative assessment

## 2015-08-03 NOTE — Progress Notes (Signed)
I have interviewed and performed the pertinent exams on my patient to confirm that there have been no significant changes in her condition since the dictation of her history and physical exam.  

## 2015-08-03 NOTE — Discharge Instructions (Signed)
Hysteroscopy, Care After Refer to this sheet in the next few weeks. These instructions provide you with information on caring for yourself after your procedure. Your health care provider may also give you more specific instructions. Your treatment has been planned according to current medical practices, but problems sometimes occur. Call your health care provider if you have any problems or questions after your procedure.  WHAT TO EXPECT AFTER THE PROCEDURE After your procedure, it is typical to have the following:  You may have some cramping. This normally lasts for a couple days.  You may have bleeding. This can vary from light spotting for a few days to menstrual-like bleeding for 3-7 days. HOME CARE INSTRUCTIONS  Rest for the first 1-2 days after the procedure.  Only take over-the-counter or prescription medicines as directed by your health care provider. Do not take aspirin. It can increase the chances of bleeding.  Take showers instead of baths for 2 weeks or as directed by your health care provider.  Do not drive for 24 hours or as directed.  Do not drink alcohol while taking pain medicine.  Do not use tampons, douche, or have sexual intercourse for 2 weeks or until your health care provider says it is okay.  Take your temperature twice a day for 4-5 days. Write it down each time.  Follow your health care provider's advice about diet, exercise, and lifting.  If you develop constipation, you may:  Take a mild laxative if your health care provider approves.  Add bran foods to your diet.  Drink enough fluids to keep your urine clear or pale yellow.  Try to have someone with you or available to you for the first 24-48 hours, especially if you were given a general anesthetic.  Call Dr. Alden Hipp [phone]) if you are having any problems or concerns.DISCHARGE INSTRUCTIONS: D&C / D&E The following instructions have been prepared to help you care for yourself upon your  return home.   Personal hygiene:  Use sanitary pads for vaginal drainage, not tampons.  Shower the day after your procedure.  NO tub baths, pools or Jacuzzis for 2-3 weeks.  Wipe front to back after using the bathroom.  Activity and limitations:  Do NOT drive or operate any equipment for 24 hours. The effects of anesthesia are still present and drowsiness may result.  Do NOT rest in bed all day.  Walking is encouraged.  Walk up and down stairs slowly.  You may resume your normal activity in one to two days or as indicated by your physician.  Sexual activity: NO intercourse for at least 2 weeks after the procedure, or as indicated by your physician.  Diet: Eat a light meal as desired this evening. You may resume your usual diet tomorrow.  Return to work: You may resume your work activities in one to two days or as indicated by your doctor.  What to expect after your surgery: Expect to have vaginal bleeding/discharge for 2-3 days and spotting for up to 10 days. It is not unusual to have soreness for up to 1-2 weeks. You may have a slight burning sensation when you urinate for the first day. Mild cramps may continue for a couple of days. You may have a regular period in 2-6 weeks.  Call your doctor for any of the following:  Excessive vaginal bleeding, saturating and changing one pad every hour.  Inability to urinate 6 hours after discharge from hospital.  Pain not relieved by pain medication.  Fever  of 100.4 F or greater.  Unusual vaginal discharge or odor.   Call for an appointment:    Patients signature: ______________________  Nurses signature ________________________  Support person's signature_______________________

## 2015-08-03 NOTE — Anesthesia Postprocedure Evaluation (Signed)
Anesthesia Post Note  Patient: [name]  Procedure(s) Performed: Procedure(s) (LRB): DILATATION & CURETTAGE/HYSTEROSCOPY WITH MYOSURE (N/A)  Patient location during evaluation: PACU Anesthesia Type: General Level of consciousness: awake Pain management: pain level controlled Vital Signs Assessment: post-procedure vital signs reviewed and stable Respiratory status: spontaneous breathing Cardiovascular status: stable Postop Assessment: no signs of nausea or vomiting Anesthetic complications: no     Last Vitals:  Filed Vitals:   08/03/15 1145 08/03/15 1150  BP: 122/70   Pulse: 72 88  Temp:  36.5 C  Resp: 23     Last Pain: There were no vitals filed for this visit. Pain Goal: Patients Stated Pain Goal: 3 (08/03/15 0839)               Chang Tiggs JR,JOHN Mateo Flow

## 2015-08-04 ENCOUNTER — Encounter (HOSPITAL_COMMUNITY): Payer: Self-pay | Admitting: Obstetrics & Gynecology

## 2015-08-06 ENCOUNTER — Ambulatory Visit: Payer: Medicare Other | Admitting: Internal Medicine

## 2015-08-11 ENCOUNTER — Encounter: Payer: Self-pay | Admitting: Pain Medicine

## 2015-08-11 ENCOUNTER — Ambulatory Visit
Admission: RE | Admit: 2015-08-11 | Discharge: 2015-08-11 | Disposition: A | Discharge status: Home / Self Care | Payer: Medicare Other | Source: Ambulatory Visit | Attending: Pain Medicine | Admitting: Pain Medicine

## 2015-08-11 ENCOUNTER — Ambulatory Visit (HOSPITAL_BASED_OUTPATIENT_CLINIC_OR_DEPARTMENT_OTHER): Payer: Medicare Other | Admitting: Pain Medicine

## 2015-08-11 VITALS — BP 164/72 | HR 88 | Temp 98.2°F | Resp 16 | Ht 61.0 in | Wt 219.0 lb

## 2015-08-11 DIAGNOSIS — M533 Sacrococcygeal disorders, not elsewhere classified: Secondary | ICD-10-CM | POA: Insufficient documentation

## 2015-08-11 DIAGNOSIS — M5126 Other intervertebral disc displacement, lumbar region: Secondary | ICD-10-CM | POA: Diagnosis not present

## 2015-08-11 DIAGNOSIS — E039 Hypothyroidism, unspecified: Secondary | ICD-10-CM | POA: Diagnosis not present

## 2015-08-11 DIAGNOSIS — M461 Sacroiliitis, not elsewhere classified: Secondary | ICD-10-CM

## 2015-08-11 DIAGNOSIS — I10 Essential (primary) hypertension: Secondary | ICD-10-CM | POA: Insufficient documentation

## 2015-08-11 DIAGNOSIS — I340 Nonrheumatic mitral (valve) insufficiency: Secondary | ICD-10-CM | POA: Insufficient documentation

## 2015-08-11 DIAGNOSIS — M25552 Pain in left hip: Secondary | ICD-10-CM

## 2015-08-11 DIAGNOSIS — Z79891 Long term (current) use of opiate analgesic: Secondary | ICD-10-CM | POA: Diagnosis not present

## 2015-08-11 DIAGNOSIS — G8929 Other chronic pain: Secondary | ICD-10-CM

## 2015-08-11 DIAGNOSIS — F1190 Opioid use, unspecified, uncomplicated: Secondary | ICD-10-CM

## 2015-08-11 DIAGNOSIS — E669 Obesity, unspecified: Secondary | ICD-10-CM | POA: Diagnosis not present

## 2015-08-11 DIAGNOSIS — R209 Unspecified disturbances of skin sensation: Secondary | ICD-10-CM | POA: Insufficient documentation

## 2015-08-11 DIAGNOSIS — J45909 Unspecified asthma, uncomplicated: Secondary | ICD-10-CM | POA: Diagnosis not present

## 2015-08-11 DIAGNOSIS — F4310 Post-traumatic stress disorder, unspecified: Secondary | ICD-10-CM | POA: Diagnosis not present

## 2015-08-11 DIAGNOSIS — M7062 Trochanteric bursitis, left hip: Secondary | ICD-10-CM

## 2015-08-11 DIAGNOSIS — M1288 Other specific arthropathies, not elsewhere classified, other specified site: Secondary | ICD-10-CM | POA: Insufficient documentation

## 2015-08-11 DIAGNOSIS — F410 Panic disorder [episodic paroxysmal anxiety] without agoraphobia: Secondary | ICD-10-CM | POA: Insufficient documentation

## 2015-08-11 DIAGNOSIS — M1612 Unilateral primary osteoarthritis, left hip: Secondary | ICD-10-CM | POA: Insufficient documentation

## 2015-08-11 DIAGNOSIS — I480 Paroxysmal atrial fibrillation: Secondary | ICD-10-CM | POA: Insufficient documentation

## 2015-08-11 DIAGNOSIS — M25559 Pain in unspecified hip: Secondary | ICD-10-CM | POA: Diagnosis present

## 2015-08-11 DIAGNOSIS — R202 Paresthesia of skin: Secondary | ICD-10-CM | POA: Insufficient documentation

## 2015-08-11 DIAGNOSIS — M47816 Spondylosis without myelopathy or radiculopathy, lumbar region: Secondary | ICD-10-CM | POA: Insufficient documentation

## 2015-08-11 DIAGNOSIS — Z5181 Encounter for therapeutic drug level monitoring: Secondary | ICD-10-CM

## 2015-08-11 DIAGNOSIS — I495 Sick sinus syndrome: Secondary | ICD-10-CM | POA: Insufficient documentation

## 2015-08-11 DIAGNOSIS — M4806 Spinal stenosis, lumbar region: Secondary | ICD-10-CM

## 2015-08-11 DIAGNOSIS — Z6841 Body Mass Index (BMI) 40.0 and over, adult: Secondary | ICD-10-CM | POA: Diagnosis not present

## 2015-08-11 DIAGNOSIS — K219 Gastro-esophageal reflux disease without esophagitis: Secondary | ICD-10-CM | POA: Diagnosis not present

## 2015-08-11 DIAGNOSIS — Z79899 Other long term (current) drug therapy: Secondary | ICD-10-CM | POA: Diagnosis not present

## 2015-08-11 DIAGNOSIS — E785 Hyperlipidemia, unspecified: Secondary | ICD-10-CM | POA: Diagnosis not present

## 2015-08-11 DIAGNOSIS — M47818 Spondylosis without myelopathy or radiculopathy, sacral and sacrococcygeal region: Secondary | ICD-10-CM

## 2015-08-11 DIAGNOSIS — Z950 Presence of cardiac pacemaker: Secondary | ICD-10-CM | POA: Diagnosis not present

## 2015-08-11 DIAGNOSIS — S7002XA Contusion of left hip, initial encounter: Secondary | ICD-10-CM | POA: Diagnosis not present

## 2015-08-11 DIAGNOSIS — M47817 Spondylosis without myelopathy or radiculopathy, lumbosacral region: Secondary | ICD-10-CM

## 2015-08-11 DIAGNOSIS — M48061 Spinal stenosis, lumbar region without neurogenic claudication: Secondary | ICD-10-CM

## 2015-08-11 NOTE — Patient Instructions (Signed)

## 2015-08-11 NOTE — Progress Notes (Signed)
Patient's Name: [name]  Patient type: New patient  MRN: [id number]  Service setting: Ambulatory outpatient  DOB: [birth year]  Location: [facility]  DOS: 08/11/2015  Primary Care Physician: Malachi Carl, MD  Note by: Kathlen Brunswick. Dossie Arbour, M.D, DABA, DABAPM, DABPM, DABIPP, FIPP  Referring Physician: No ref. provider found  Specialty: Board-Certified Interventional Pain Management     Primary Reason(s) for Visit: Initial Patient Evaluation CC: Hip Pain   HPI  [name] is a 75 y.o. year old, female patient, who comes today for an initial evaluation. She has HYPOTHYROIDISM; HYPERLIPIDEMIA; OBESITY; Essential hypertension; SICK SINUS SYNDROME; Cardiac pacemaker in situ; Cough; GERD (gastroesophageal reflux disease); Atrial fibrillation ([place]); Chronic pain; Chronic hip pain (Location of Primary Source of Pain) (Left); Long term current use of opiate analgesic; Long term prescription opiate use; Opiate use (54 MME/Day); Encounter for therapeutic drug level monitoring; Disturbance of skin sensation; Osteoarthritis of hip (Left); Trochanteric bursitis of hip (Left); Lumbar central spinal stenosis (L4-5); Lumbar facet arthropathy; and Osteoarthritis of sacroiliac joint (with vacuum phenomena in) (Bilateral) on her problem list.. Her primarily concern today is the Hip Pain   Pain Assessment: Self-Reported Pain Score: 6 clinically she looks like a 3/10. Reported level is inconsistent with clinical obrservations Information on the proper use of the pain score provided to the patient today. Pain Type: Chronic pain Pain Location: Hip Pain Orientation: Left Pain Descriptors / Indicators: Sharp, Constant Pain Frequency: Constant  Onset and Duration: Gradual, Date of onset: Over 5 years ago and Present longer than 3 months Cause of pain: Arthritis Severity: Getting worse, NAS-11 at its worse: 8/10, NAS-11 at its best: 3/10, NAS-11 now: 5/10 and NAS-11 on the average:  4/10 Timing: Not influenced by the time of the day Aggravating Factors: Climbing, Prolonged sitting, Prolonged standing, Stooping , Walking, Walking uphill and Walking downhill Alleviating Factors: Medications Associated Problems: Day-time cramps, Night-time cramps and Pain that does not allow patient to sleep Quality of Pain: Burning, Nagging and Throbbing Previous Examinations or Tests: X-rays Previous Treatments: Narcotic medications  The patient comes into the clinics today for the first time for a chronic pain management evaluation. According to the patient the primary area of pain is that of the left hip. She has noticed that it worsens as the day progresses and if she has to get up to go to the bathroom in the middle and night she has to use a walker because when she puts weight on it the pain is excruciating.   Historic Controlled Substance Pharmacotherapy Review  Previously Prescribed Opioids: Tylenol No. 4 Currently Prescribed Analgesic: Codeine/APAP (Tylenol No. 4) 60/300 tablet every 4 hours (360 mg/day of codeine) (1800 mg/day of Tylenol) Medications: The patient did not bring the medication(s) to the appointment, as requested in our "New Patient Package". She has been prescribed Tylenol #4 for the pain by Dr Malachi Carl and she feels that there may be better alternatives than this. She is currently taking these every day anywhere from 4 - 8 hours apart. She also is using Flexeril 10 mg at night for rest.  MME/day: 54 mg/day Pharmacodynamics: Analgesic Effect: More than 50% Activity Facilitation: Medication(s) allow patient to sit, stand, walk, and do the basic ADLs Perceived Effectiveness: Described as relatively effective, allowing for increase in activities of daily living (ADL) Side-effects or Adverse reactions: None reported Historical Background Evaluation: [place] PDMP: Five (5) year initial data search conducted. No abnormal patterns identified [place]  Information: Non-contributory UDS Results: No UDS results available at this time UDS Interpretation: N/A Medication Assessment Form: Not applicable. Initial evaluation. The patient has not received any medications from our practice Treatment compliance: Not applicable. Initial evaluation Risk Assessment: Aberrant Behavior: None observed or detected today Opioid Fatal Overdose Risk Factors: None identified today Non-fatal overdose hazard ratio (HR): Calculation deferred Fatal overdose hazard ratio (HR): Calculation deferred Substance Use Disorder (SUD) Risk Level: Pending results of Medical Psychology Evaluation for SUD Opioid Risk Tool (ORT) Score: Total Score: 0 Low Risk for SUD (Score <3) Depression Scale Score: PHQ-2: PHQ-2 Total Score: 0 No depression (0) PHQ-9: PHQ-9 Total Score: 0 No depression (0-4)  Pharmacologic Plan: Pending ordered tests and/or consults  Historical Illicit Drug Screen Labs(s): No results found for: MDMA, COCAINSCRNUR, PCPSCRNUR, THCU, ETH  Meds  The patient has a current medication list which includes the following prescription(s): acetaminophen-codeine, colchicine, cyclobenzaprine, levothyroxine, liothyronine, metoprolol, multivitamin with minerals, potassium chloride, triamterene-hydrochlorothiazide, and warfarin.  Imaging Review  Lumbar Imaging: Lumbar CT wo contrast:  Results for orders placed during the hospital encounter of 12/10/13  CT Lumbar Spine Wo Contrast   Narrative CLINICAL DATA:  Low back pain. Recently bent over and could not stand back up; evaluate for possible T10 or T12 deformity.  EXAM: CT LUMBAR SPINE WITHOUT CONTRAST  TECHNIQUE: Multidetector CT imaging of the lumbar spine was performed without intravenous contrast administration. Multiplanar CT image reconstructions were also generated.  COMPARISON:  CT abdomen and pelvis sagittal reformatted images dated 05/22/2009. CT chest sagittal reformatted images dated  10/21/2010.  FINDINGS: Segmentation: Normal.  The scan extends from T9 downward.  Alignment:  Normal.  Vertebrae: There is sclerosis involving the anterior superior margin of T12 and the anterior inferior margin T11 without significant depression or deformity. Marked anterior spurring noted at T12-L1 eccentric to RIGHT. No compression fracture is seen involving lower thoracic or lumbar vertebrae. The appearance of the lower thoracic spine is unchanged from previous cross-sectional imaging. Coarsened trabecular markings suggest skeletal osteopenia.  Conus medullaris: Incompletely evaluated on this noncontrast CT scan. Small intradural calcification, opposite the L2-3 interspace, could represent a mild form of occult spinal dysraphism.  Paraspinal tissues: Pacemaker incompletely evaluated. Non aneurysmal atherosclerosis of the abdominal aorta. No hydronephrosis.  Disc levels:  Unremarkable appearing L1-2, L2-3, and L3-4 levels.  Mild central canal stenosis at L4-5 related to apparent central protrusion and facet arthropathy with ligamentum flavum hypertrophy. No definite impingement.  Unremarkable appearing L5-S1 level.  BILATERAL SI joint vacuum phenomenon.  IMPRESSION: No acute compression deformity is evident. The appearance of the lower thoracic spine is unchanged from previous CT abdomen and pelvis 2011.  Mild central canal stenosis is suspected at L4-5 related to shallow central protrusion and posterior element hypertrophy.   Electronically Signed   By: Rolla Flatten M.D.   On: 12/10/2013 14:03   Reviewed.  ROS  Cardiovascular History: Heart trouble, Abnormal heart rhythm, Pacemaker or defibrillator and Blood thinners:  Anticoagulant (Coumadin) Pulmonary or Respiratory History: Asthma Neurological History: Negative for epilepsy, stroke, urinary or fecal inontinence, spina bifida or tethered cord syndrome Review of Past Neurological Studies: No results found for  this or any previous visit. Psychological-Psychiatric History: Anxiety and Panic Attacks Gastrointestinal History: Reflux or heatburn Genitourinary History: Kidney disease Hematological History: Positive for taking the following blood thinner: Coumadin Endocrine History: Hypothyroidism Rheumatologic History: Negative for lupus, osteoarthritis, rheumatoid arthritis, myositis, polymyositis or fibromyagia Musculoskeletal History: Negative for myasthenia gravis, muscular dystrophy, multiple sclerosis or malignant hyperthermia Work  History: Retired  Allergies  Ms. Wolfman is allergic to contrast media.   Laboratory Chemistry  Inflammation Markers No results found for: ESRSEDRATE, CRP  Renal Function Lab Results  Component Value Date   BUN 37* 07/23/2015   CREATININE 1.14* 07/23/2015   GFRAA 53* 07/23/2015   GFRNONAA 46* 07/23/2015    Hepatic Function Lab Results  Component Value Date   AST 24 05/22/2009   ALT 20 05/22/2009   ALBUMIN 3.0* 05/22/2009    Electrolytes Lab Results  Component Value Date   NA 136 07/23/2015   K 4.1 07/23/2015   CL 99* 07/23/2015   CALCIUM 10.2 07/23/2015    Pain Modulating Vitamins No results found for: VD25OH, VD125OH2TOT, [id number], [id number], VITAMINB12  Coagulation Parameters Lab Results  Component Value Date   INR 1.87* 07/23/2015   LABPROT 21.5* 07/23/2015   APTT 34 07/23/2015   PLT 181 07/23/2015    Note: Labs reviewed  [facility]  Medical:  Ms. Sunderman  has a past medical history of Hypothyroidism; Hyperlipidemia; Hypertension; Paroxysmal atrial fibrillation ([place]); Obesity; Sick sinus syndrome ([place]); Aortic insufficiency; Mitral regurgitation; Bronchitis; Rhinitis; Arthritis; Asthma; Sleep apnea; Anxiety; PTSD (post-traumatic stress disorder); GERD (gastroesophageal reflux disease); Bursitis, hip; and Claustrophobia. Family: family history includes Alzheimer's disease in her father; Arthritis in her sister; Cancer in her brother and  maternal grandfather; Heart disease in her mother; Hypertension in her sister; Stroke in her mother. Surgical:  has past surgical history that includes Tonsillectomy; Pacemaker insertion; Status post child birth x1; Rotator cuff repair; and Dilatation & curettage/hysteroscopy with myosure (N/A, 08/03/2015). Tobacco:  reports that she has never smoked. She has never used smokeless tobacco. Alcohol:  reports that she does not drink alcohol. Drug:  reports that she does not use illicit drugs. Active Ambulatory Problems    Diagnosis Date Noted  . HYPOTHYROIDISM 06/04/2008  . HYPERLIPIDEMIA 06/04/2008  . OBESITY 06/04/2008  . Essential hypertension 06/04/2008  . SICK SINUS SYNDROME 06/04/2008  . Cardiac pacemaker in situ 06/05/2008  . Cough   . GERD (gastroesophageal reflux disease) 10/27/2010  . Atrial fibrillation ([place]) 02/16/2015  . Chronic pain 08/11/2015  . Chronic hip pain (Location of Primary Source of Pain) (Left) 08/11/2015  . Long term current use of opiate analgesic 08/11/2015  . Long term prescription opiate use 08/11/2015  . Opiate use (54 MME/Day) 08/11/2015  . Encounter for therapeutic drug level monitoring 08/11/2015  . Disturbance of skin sensation 08/11/2015  . Osteoarthritis of hip (Left) 08/11/2015  . Trochanteric bursitis of hip (Left) 08/11/2015  . Lumbar central spinal stenosis (L4-5) 08/11/2015  . Lumbar facet arthropathy 08/11/2015  . Osteoarthritis of sacroiliac joint (with vacuum phenomena in) (Bilateral) 08/11/2015   Resolved Ambulatory Problems    Diagnosis Date Noted  . PAROXYSMAL ATRIAL FIBRILLATION 06/04/2008   Past Medical History  Diagnosis Date  . Hypothyroidism   . Hyperlipidemia   . Hypertension   . Paroxysmal atrial fibrillation ([facility])   . Obesity   . Sick sinus syndrome ([place])   . Aortic insufficiency   . Mitral regurgitation   . Bronchitis   . Rhinitis   . Arthritis   . Asthma   . Sleep apnea   . Anxiety   . PTSD (post-traumatic stress  disorder)   . Bursitis, hip   . Claustrophobia     Constitutional Exam  Vitals: Blood pressure 164/72, pulse 88, temperature 98.2 F (36.8 C), temperature source Oral, resp. rate 16, height 5\' 1"  (1.549 m), weight 219 lb (99.338 kg), SpO2  100 %. General appearance: Well nourished, well developed, and well hydrated. In no acute distress Calculated BMI/Body habitus: Body mass index is 41.4 kg/(m^2). (>40 kg/m2) Extreme obesity (Class III) - 254% higher incidence of chronic pain Psych/Mental status: Alert and oriented x 3 (person, place, & time) Eyes: PERLA Respiratory: No evidence of acute respiratory distress  Cervical Spine Exam  Inspection: No masses, redness, or swelling Alignment: Symmetrical ROM: Functional: Adequate ROM Active: Unrestricted ROM Stability: No instability detected Muscle strength & Tone: Functionally intact Sensory: Unimpaired Palpation: No complaints of tenderness  Upper Extremity (UE) Exam    Side: Right upper extremity  Side: Left upper extremity  Inspection: No masses, redness, swelling, or asymmetry  Inspection: No masses, redness, swelling, or asymmetry  ROM:  ROM:  Functional: Adequate ROM  Functional: Adequate ROM  Active: Unrestricted ROM  Active: Unrestricted ROM  Muscle strength & Tone: Functionally intact  Muscle strength & Tone: Functionally intact  Sensory: Unimpaired  Sensory: Unimpaired  Palpation: Non-contributory  Palpation: Non-contributory   Thoracic Spine Exam  Inspection: No masses, redness, or swelling Alignment: Symmetrical ROM: Functional: Adequate ROM Active: Unrestricted ROM Stability: No instability detected Sensory: Unimpaired Muscle strength & Tone: Functionally intact Palpation: No complaints of tenderness  Lumbar Spine Exam  Inspection: No masses, redness, or swelling Alignment: Symmetrical ROM: Functional: Limited ROM Active: Decreased ROM Stability: No instability detected Muscle strength & Tone: Functionally  intact Sensory: Unimpaired Palpation: Tender Provocative Tests: Lumbar Hyperextension and rotation test: deferred Patrick's Maneuver: Positive for left hip pain  Gait & Posture Assessment  Ambulation: Patient ambulates using a walker Gait: Stiff hip gait (hip ankylosis) Posture: Antalgic  Lower Extremity Exam    Side: Right lower extremity  Side: Left lower extremity  Inspection: No masses, redness, swelling, or asymmetry ROM:  Inspection: No masses, redness, swelling, or asymmetry ROM:  Functional: Adequate ROM  Functional: Limited ROM for the hip joint   Active: Unrestricted ROM  Active: Limited ROM for the hip joint   Muscle strength & Tone: Functionally intact  Muscle strength & Tone: Functionally intact  Sensory: Unimpaired  Sensory: Unimpaired  Palpation: Non-contributory  Palpation: Positive tenderness over the trochanteric bursa with reproduction of some of the patient's pain.   Assessment  Primary Diagnosis & Pertinent Problem List: The primary encounter diagnosis was Chronic pain. Diagnoses of Chronic left hip pain, Long term current use of opiate analgesic, Long term prescription opiate use, Opiate use, Encounter for therapeutic drug level monitoring, Disturbance of skin sensation, Primary osteoarthritis of left hip, Trochanteric bursitis of hip (Left), Lumbar central spinal stenosis (L4-5), Lumbar facet arthropathy, and Osteoarthritis of sacroiliac joint (with vacuum phenomena in) (Bilateral) were also pertinent to this visit.  Visit Diagnosis: 1. Chronic pain   2. Chronic left hip pain   3. Long term current use of opiate analgesic   4. Long term prescription opiate use   5. Opiate use   6. Encounter for therapeutic drug level monitoring   7. Disturbance of skin sensation   8. Primary osteoarthritis of left hip   9. Trochanteric bursitis of hip (Left)   10. Lumbar central spinal stenosis (L4-5)   11. Lumbar facet arthropathy   12. Osteoarthritis of sacroiliac joint  (with vacuum phenomena in) (Bilateral)     Assessment: No problem-specific assessment & plan notes found for this encounter.   Plan of Care  Initial Treatment Plan:  Please be advised that as per protocol, today's visit has been an evaluation only. We have not taken over  the patient's controlled substance management.  Problem List Items Addressed This Visit      High   Chronic hip pain (Location of Primary Source of Pain) (Left) (Chronic)   Relevant Orders   DG HIP UNILAT W OR W/O PELVIS 2-3 VIEWS LEFT   CT Hip Left Wo Contrast   HIP INJECTION   Chronic pain - Primary (Chronic)   Relevant Medications   cyclobenzaprine (FLEXERIL) 5 MG tablet   Other Relevant Orders   Comprehensive metabolic panel   C-reactive protein   Magnesium   Sedimentation rate   25-Hydroxyvitamin D Lcms D2+D3   Lumbar central spinal stenosis (L4-5) (Chronic)   Lumbar facet arthropathy (Chronic)   Osteoarthritis of hip (Left) (Chronic)   Relevant Medications   cyclobenzaprine (FLEXERIL) 5 MG tablet   Other Relevant Orders   HIP INJECTION   Osteoarthritis of sacroiliac joint (with vacuum phenomena in) (Bilateral) (Chronic)   Relevant Medications   cyclobenzaprine (FLEXERIL) 5 MG tablet   Trochanteric bursitis of hip (Left) (Chronic)   Relevant Orders   HIP INJECTION     Medium   Encounter for therapeutic drug level monitoring (Chronic)   Long term current use of opiate analgesic (Chronic)   Relevant Orders   Compliance Drug Analysis, Ur   Long term prescription opiate use (Chronic)   Opiate use (54 MME/Day) (Chronic)     Low   Disturbance of skin sensation (Chronic)   Relevant Orders   Vitamin B12      Pharmacotherapy (Medications Ordered): No orders of the defined types were placed in this encounter.    Lab-work & Procedure Ordered: Orders Placed This Encounter  Procedures  . HIP INJECTION  . DG HIP UNILAT W OR W/O PELVIS 2-3 VIEWS LEFT  . CT Hip Left Wo Contrast  . Compliance  Drug Analysis, Ur  . Comprehensive metabolic panel  . C-reactive protein  . Magnesium  . Sedimentation rate  . Vitamin B12  . 25-Hydroxyvitamin D Lcms D2+D3    Imaging Ordered: CT HIP LEFT WO CONTRAST  Interventional Therapies: Scheduled:   1. Diagnostic left intra-articular hip joint injection under fluoroscopic guidance, with a without sedation.  2. Possible left trochanteric bursa injection under fluoroscopic guidance, with a without sedation.    Considering:   1. Diagnostic left intra-articular hip joint injection under fluoroscopic guidance, with a without sedation.  2. Possible left hip radiofrequency ablation under fluoroscopic guidance and IV sedation depending on the results of the diagnostic injection.  3. Diagnostic left-sided L4-5 lumbar epidural steroid injection under fluoroscopic guidance, with a without sedation.  4. Diagnostic left transforaminal epidural steroid injection under fluoroscopic guidance, with or without sedation.  5. Diagnostic left sided sacroiliac joint block under fluoroscopic guidance, with a without sedation.  6. Possible left sided sacroiliac joint radiofrequency ablation under fluoroscopic guidance and IV sedation depending on the results of the diagnostic injection.    PRN Procedures:   1. Palliative left intra-articular hip joint injection under fluoroscopic guidance, with a without sedation.  2. Palliative left trochanteric bursa injection under fluoroscopic guidance, with or without sedation.    Referral(s) or Consult(s): Medical psychology consult for substance use disorder evaluation  Medications administered during this visit: Ms. Moreno does not currently have medications on file.  Prescriptions ordered during this visit: New Prescriptions   No medications on file    Requested PM Follow-up: Return for Return after ordered test(s)., Procedure (Scheduled).  Future Appointments Date Time Provider [place]  08/18/2015 12:30  PM [facility]  1 [facility] [place]  08/20/2015 10:30 AM Fay Records, MD [facility]  09/02/2015 1:00 PM Milinda Pointer, MD [facility] None     Primary Care Physician: Malachi Carl, MD Location: [facility] Note by: Kathlen Brunswick. Dossie Arbour, M.D, DABA, DABAPM, DABPM, DABIPP, FIPP  Pain Score Disclaimer: We use the NRS-11 scale. This is a self-reported, subjective measurement of pain severity with only modest accuracy. It is used primarily to identify changes within a particular patient. It must be understood that outpatient pain scales are significantly less accurate that those used for research, where they can be applied under ideal controlled circumstances with minimal exposure to variables. In reality, the score is likely to be a combination of pain intensity and pain affect, where pain affect describes the degree of emotional arousal or changes in action readiness caused by the sensory experience of pain. Factors such as social and work situation, setting, emotional state, anxiety levels, expectation, and prior pain experience may influence pain perception and show large inter-individual differences that may also be affected by time variables.  Patient instructions provided during this appointment: Patient Instructions   GENERAL RISKS AND COMPLICATIONS  What are the risk, side effects and possible complications? Generally speaking, most procedures are safe.  However, with any procedure there are risks, side effects, and the possibility of complications.  The risks and complications are dependent upon the sites that are lesioned, or the type of nerve block to be performed.  The closer the procedure is to the spine, the more serious the risks are.  Great care is taken when placing the radio frequency needles, block needles or lesioning probes, but sometimes complications can occur. 1. Infection: Any time there is an injection through the skin, there is a risk of  infection.  This is why sterile conditions are used for these blocks.  There are four possible types of infection. 1. Localized skin infection. 2. Central Nervous System Infection-This can be in the form of Meningitis, which can be deadly. 3. Epidural Infections-This can be in the form of an epidural abscess, which can cause pressure inside of the spine, causing compression of the spinal cord with subsequent paralysis. This would require an emergency surgery to decompress, and there are no guarantees that the patient would recover from the paralysis. 4. Discitis-This is an infection of the intervertebral discs.  It occurs in about 1% of discography procedures.  It is difficult to treat and it may lead to surgery.        2. Pain: the needles have to go through skin and soft tissues, will cause soreness.       3. Damage to internal structures:  The nerves to be lesioned may be near blood vessels or    other nerves which can be potentially damaged.       4. Bleeding: Bleeding is more common if the patient is taking blood thinners such as  aspirin, Coumadin, Ticiid, Plavix, etc., or if he/she have some genetic predisposition  such as hemophilia. Bleeding into the spinal canal can cause compression of the spinal  cord with subsequent paralysis.  This would require an emergency surgery to  decompress and there are no guarantees that the patient would recover from the  paralysis.       5. Pneumothorax:  Puncturing of a lung is a possibility, every time a needle is introduced in  the area of the chest or upper back.  Pneumothorax refers to free air around the  collapsed lung(s), inside of the thoracic cavity (chest cavity).  Another two possible  complications related to a similar event would include: Hemothorax and Chylothorax.   These are variations of the Pneumothorax, where instead of air around the collapsed  lung(s), you may have blood or chyle, respectively.       6. Spinal headaches: They may occur with  any procedures in the area of the spine.       7. Persistent CSF (Cerebro-Spinal Fluid) leakage: This is a rare problem, but may occur  with prolonged intrathecal or epidural catheters either due to the formation of a fistulous  track or a dural tear.       8. Nerve damage: By working so close to the spinal cord, there is always a possibility of  nerve damage, which could be as serious as a permanent spinal cord injury with  paralysis.       9. Death:  Although rare, severe deadly allergic reactions known as "Anaphylactic  reaction" can occur to any of the medications used.      10. Worsening of the symptoms:  We can always make thing worse.  What are the chances of something like this happening? Chances of any of this occuring are extremely low.  By statistics, you have more of a chance of getting killed in a motor vehicle accident: while driving to the hospital than any of the above occurring .  Nevertheless, you should be aware that they are possibilities.  In general, it is similar to taking a shower.  Everybody knows that you can slip, hit your head and get killed.  Does that mean that you should not shower again?  Nevertheless always keep in mind that statistics do not mean anything if you happen to be on the wrong side of them.  Even if a procedure has a 1 (one) in a 1,000,000 (million) chance of going wrong, it you happen to be that one..Also, keep in mind that by statistics, you have more of a chance of having something go wrong when taking medications.  Who should not have this procedure? If you are on a blood thinning medication (e.g. Coumadin, Plavix, see list of "Blood Thinners"), or if you have an active infection going on, you should not have the procedure.  If you are taking any blood thinners, please inform your physician.  How should I prepare for this procedure?  Do not eat or drink anything at least six hours prior to the procedure.  Bring a driver with you .  It cannot be a  taxi.  Come accompanied by an adult that can drive you back, and that is strong enough to help you if your legs get weak or numb from the local anesthetic.  Take all of your medicines the morning of the procedure with just enough water to swallow them.  If you have diabetes, make sure that you are scheduled to have your procedure done first thing in the morning, whenever possible.  If you have diabetes, take only half of your insulin dose and notify our nurse that you have done so as soon as you arrive at the clinic.  If you are diabetic, but only take blood sugar pills (oral hypoglycemic), then do not take them on the morning of your procedure.  You may take them after you have had the procedure.  Do not take aspirin or any aspirin-containing medications, at least eleven (11) days prior to the procedure.  They may prolong bleeding.  Wear loose fitting clothing that may be easy to take off and that you would not mind if it got stained with Betadine or blood.  Do not wear any jewelry or perfume  Remove any nail coloring.  It will interfere with some of our monitoring equipment.  NOTE: Remember that this is not meant to be interpreted as a complete list of all possible complications.  Unforeseen problems may occur.  BLOOD THINNERS The following drugs contain aspirin or other products, which can cause increased bleeding during surgery and should not be taken for 2 weeks prior to and 1 week after surgery.  If you should need take something for relief of minor pain, you may take acetaminophen which is found in Tylenol,m Datril, Anacin-3 and Panadol. It is not blood thinner. The products listed below are.  Do not take any of the products listed below in addition to any listed on your instruction sheet.  A.P.C or A.P.C with Codeine Codeine Phosphate Capsules #3 Ibuprofen Ridaura  ABC compound Congesprin Imuran rimadil  Advil Cope Indocin Robaxisal  Alka-Seltzer Effervescent Pain Reliever and  Antacid Coricidin or Coricidin-D  Indomethacin Rufen  Alka-Seltzer plus Cold Medicine Cosprin Ketoprofen S-A-C Tablets  Anacin Analgesic Tablets or Capsules Coumadin Korlgesic Salflex  Anacin Extra Strength Analgesic tablets or capsules CP-2 Tablets Lanoril Salicylate  Anaprox Cuprimine Capsules Levenox Salocol  Anexsia-D Dalteparin Magan Salsalate  Anodynos Darvon compound Magnesium Salicylate Sine-off  Ansaid Dasin Capsules Magsal Sodium Salicylate  Anturane Depen Capsules Marnal Soma  APF Arthritis pain formula Dewitt's Pills Measurin Stanback  Argesic Dia-Gesic Meclofenamic Sulfinpyrazone  Arthritis Bayer Timed Release Aspirin Diclofenac Meclomen Sulindac  Arthritis pain formula Anacin Dicumarol Medipren Supac  Analgesic (Safety coated) Arthralgen Diffunasal Mefanamic Suprofen  Arthritis Strength Bufferin Dihydrocodeine Mepro Compound Suprol  Arthropan liquid Dopirydamole Methcarbomol with Aspirin Synalgos  ASA tablets/Enseals Disalcid Micrainin Tagament  Ascriptin Doan's Midol Talwin  Ascriptin A/D Dolene Mobidin Tanderil  Ascriptin Extra Strength Dolobid Moblgesic Ticlid  Ascriptin with Codeine Doloprin or Doloprin with Codeine Momentum Tolectin  Asperbuf Duoprin Mono-gesic Trendar  Aspergum Duradyne Motrin or Motrin IB Triminicin  Aspirin plain, buffered or enteric coated Durasal Myochrisine Trigesic  Aspirin Suppositories Easprin Nalfon Trillsate  Aspirin with Codeine Ecotrin Regular or Extra Strength Naprosyn Uracel  Atromid-S Efficin Naproxen Ursinus  Auranofin Capsules Elmiron Neocylate Vanquish  Axotal Emagrin Norgesic Verin  Azathioprine Empirin or Empirin with Codeine Normiflo Vitamin E  Azolid Emprazil Nuprin Voltaren  Bayer Aspirin plain, buffered or children's or timed BC Tablets or powders Encaprin Orgaran Warfarin Sodium  Buff-a-Comp Enoxaparin Orudis Zorpin  Buff-a-Comp with Codeine Equegesic Os-Cal-Gesic   Buffaprin Excedrin plain, buffered or Extra Strength  Oxalid   Bufferin Arthritis Strength Feldene Oxphenbutazone   Bufferin plain or Extra Strength Feldene Capsules Oxycodone with Aspirin   Bufferin with Codeine Fenoprofen Fenoprofen Pabalate or Pabalate-SF   Buffets II Flogesic Panagesic   Buffinol plain or Extra Strength Florinal or Florinal with Codeine Panwarfarin   Buf-Tabs Flurbiprofen Penicillamine   Butalbital Compound Four-way cold tablets Penicillin   Butazolidin Fragmin Pepto-Bismol   Carbenicillin Geminisyn Percodan   Carna Arthritis Reliever Geopen Persantine   Carprofen Gold's salt Persistin   Chloramphenicol Goody's Phenylbutazone   Chloromycetin Haltrain Piroxlcam   Clmetidine heparin Plaquenil   Cllnoril Hyco-pap Ponstel   Clofibrate Hydroxy chloroquine Propoxyphen         Before stopping any of these medications, be sure to consult the physician who ordered them.  Some, such as Coumadin (Warfarin) are ordered to prevent  or treat serious conditions such as "deep thrombosis", "pumonary embolisms", and other heart problems.  The amount of time that you may need off of the medication may also vary with the medication and the reason for which you were taking it.  If you are taking any of these medications, please make sure you notify your pain physician before you undergo any procedures.

## 2015-08-11 NOTE — Progress Notes (Signed)
New patient here today for evaluation of right hip pain.  Patient has to use a walker in the evening or a walking cane for stabilization.  States that in the mornings she is okay and does not need these devices.  She tries to pace her activity during the day.  She has been prescribed Tylenol #4 for the pain by Dr Malachi Carl and she feels that there may be better alternatives than this.  She is currently taking these every day anywhere from 4 - 8 hours apart.  She also is using Flexeril 10 mg at night for rest.  Safety precautions to be maintained throughout the outpatient stay will include: orient to surroundings, keep bed in low position, maintain call bell within reach at all times, provide assistance with transfer out of bed and ambulation.

## 2015-08-17 ENCOUNTER — Encounter: Payer: Self-pay | Admitting: Pain Medicine

## 2015-08-17 ENCOUNTER — Other Ambulatory Visit: Payer: Self-pay | Admitting: Pain Medicine

## 2015-08-17 DIAGNOSIS — M25552 Pain in left hip: Secondary | ICD-10-CM

## 2015-08-17 DIAGNOSIS — M87052 Idiopathic aseptic necrosis of left femur: Secondary | ICD-10-CM

## 2015-08-17 DIAGNOSIS — G8929 Other chronic pain: Secondary | ICD-10-CM

## 2015-08-17 NOTE — Progress Notes (Signed)
Quick Note:  Results were reviewed and found to be: abnormal   Review would suggest interventional pain management techniques may be of benefit  Further testing may be useful. MRI of the hip is recommended. An order for a left hip MRI has been placed. ______

## 2015-08-18 ENCOUNTER — Ambulatory Visit (HOSPITAL_COMMUNITY)
Admission: RE | Admit: 2015-08-18 | Discharge: 2015-08-18 | Disposition: A | Discharge status: Home / Self Care | Payer: Medicare Other | Source: Ambulatory Visit | Attending: Pain Medicine | Admitting: Pain Medicine

## 2015-08-18 ENCOUNTER — Encounter (HOSPITAL_COMMUNITY): Payer: Self-pay

## 2015-08-18 DIAGNOSIS — M1612 Unilateral primary osteoarthritis, left hip: Secondary | ICD-10-CM | POA: Diagnosis not present

## 2015-08-18 DIAGNOSIS — G8929 Other chronic pain: Secondary | ICD-10-CM | POA: Insufficient documentation

## 2015-08-18 DIAGNOSIS — M87852 Other osteonecrosis, left femur: Secondary | ICD-10-CM | POA: Insufficient documentation

## 2015-08-18 DIAGNOSIS — M25552 Pain in left hip: Secondary | ICD-10-CM | POA: Insufficient documentation

## 2015-08-19 NOTE — Progress Notes (Signed)
Quick Note:  Results were reviewed and found to be: abnormal   Review would suggest the patient to be a possible candidate for interventional pain management options  Surgical consultation is recommended ______

## 2015-08-20 ENCOUNTER — Encounter: Payer: Self-pay | Admitting: Internal Medicine

## 2015-08-20 ENCOUNTER — Ambulatory Visit (INDEPENDENT_AMBULATORY_CARE_PROVIDER_SITE_OTHER): Payer: Medicare Other | Admitting: Internal Medicine

## 2015-08-20 VITALS — BP 130/72 | HR 85 | Ht 61.0 in | Wt 229.0 lb

## 2015-08-20 DIAGNOSIS — I482 Chronic atrial fibrillation, unspecified: Secondary | ICD-10-CM

## 2015-08-20 DIAGNOSIS — F419 Anxiety disorder, unspecified: Secondary | ICD-10-CM

## 2015-08-20 LAB — COMPLIANCE DRUG ANALYSIS, UR: PDF: 0

## 2015-08-20 NOTE — Progress Notes (Signed)
Cardiology Office Note   Date:  08/21/2015   ID:  Gaylia Bringman, DOB 1940-07-06, MRN [id number]  PCP:  Malachi Carl, MD  Cardiologist:   Dorris Carnes, MD   F/U of HTN and afib     History of Present Illness: Mareya Rank is a 75 y.o. female with a history of HTN, atrial fib (rate control and coumadin)  and bradycardia (s/p PPM) I saw the pt in June 2016 SInce  Seen breathing is OK  No dizziness No palpitations      Outpatient Prescriptions Prior to Visit  Medication Sig Dispense Refill  . acetaminophen-codeine (TYLENOL #4) 300-60 MG per tablet Take 1 tablet by mouth every 4 (four) hours as needed (pain).   3  . colchicine 0.6 MG tablet Take 0.6 mg by mouth daily as needed (GOUT).     . cyclobenzaprine (FLEXERIL) 5 MG tablet Take 10 mg by mouth at bedtime.    Marland Kitchen levothyroxine (SYNTHROID, LEVOTHROID) 50 MCG tablet Take 50 mcg by mouth every morning.  1  . liothyronine (CYTOMEL) 5 MCG tablet Take 5 mcg by mouth daily.     . metoprolol (LOPRESSOR) 50 MG tablet Take 1.5 tablets (75 mg total) by mouth 2 (two) times daily. 270 tablet 3  . Multiple Vitamin (MULTIVITAMIN WITH MINERALS) TABS tablet Take 1 tablet by mouth daily.    . potassium chloride (MICRO-K) 10 MEQ CR capsule Take 10 mEq by mouth 2 (two) times daily.  3  . triamterene-hydrochlorothiazide (MAXZIDE) 75-50 MG per tablet Take 1 tablet by mouth daily.    Marland Kitchen warfarin (COUMADIN) 5 MG tablet Take 2.5-5 mg by mouth daily. Sun-0.5 half tab (2.5 mg total); All other days-1 tab (5 mg total)     No facility-administered medications prior to visit.     Allergies:   Contrast media   Past Medical History  Diagnosis Date  . Hypothyroidism   . Hyperlipidemia   . Hypertension   . Paroxysmal atrial fibrillation (HCC)   . Obesity   . Sick sinus syndrome ([facility])   . Aortic insufficiency     Trivial  . Mitral regurgitation   . Bronchitis   . Rhinitis   . Arthritis   . Asthma   . Sleep apnea     was ordered CPAP, but never fitted  for one years ago, Dr has retired  . Anxiety   . PTSD (post-traumatic stress disorder)   . GERD (gastroesophageal reflux disease)     history of  . Bursitis, hip   . Claustrophobia     Past Surgical History  Procedure Laterality Date  . Tonsillectomy    . Pacemaker insertion      Status post insertion  . Status post child birth x1    . Rotator cuff repair      left  . Dilatation & curettage/hysteroscopy with myosure N/A 08/03/2015    Procedure: DILATATION & CURETTAGE/HYSTEROSCOPY WITH MYOSURE;  Surgeon: Alden Hipp, MD;  Location: [facility];  Service: Gynecology;  Laterality: N/A;  Patient has an ICD      Social History:  The patient  reports that she has never smoked. She has never used smokeless tobacco. She reports that she does not drink alcohol or use illicit drugs.   Family History:  The patient's family history includes Alzheimer's disease in her father; Arthritis in her sister; Cancer in her brother and maternal grandfather; Heart disease in her mother; Hypertension in her sister; Stroke in her mother.    ROS:  Please see the history of present illness. All other systems are reviewed and  Negative to the above problem except as noted.    PHYSICAL EXAM: VS:  BP 130/72 mmHg  Pulse 85  Ht 5\' 1"  (1.549 m)  Wt 229 lb (103.874 kg)  BMI 43.29 kg/m2  GEN: Well nourished, well developed, in no acute distress HEENT: normal Neck: no JVD, carotid bruits, or masses Cardiac: RRR; no murmurs, rubs, or gallops,no edema  Respiratory:  clear to auscultation bilaterally, normal work of breathing GI: soft, nontender, nondistended, + BS  No hepatomegaly  MS: no deformity Moving all extremities   Skin: warm and dry, no rash Neuro:  Strength and sensation are intact Psych: euthymic mood, full affect   EKG:  EKG is ordered today.   Lipid Panel    Component Value Date/Time   CHOL 208* 11/07/2013 1424   TRIG 142.0 11/07/2013 1424   HDL 50.00 11/07/2013 1424   CHOLHDL 4 11/07/2013  1424   VLDL 28.4 11/07/2013 1424   LDLCALC 130* 11/07/2013 1424      Wt Readings from Last 3 Encounters:  08/20/15 229 lb (103.874 kg)  08/11/15 219 lb (99.338 kg)  07/23/15 229 lb 4 oz (103.987 kg)      ASSESSMENT AND PLAN:  1  HTN  BP adequated controlled  2  Atrial fib  Rates controlled  Pt appears to be tolerating  Continue meds   3.  Bradycardia  S/p PPM      Signed, Dorris Carnes, MD  08/21/2015 3:03 PM    [facility] [address], [place], [place]  [address] Phone: [phone]; Fax: [phone]

## 2015-08-21 MED ORDER — METOPROLOL TARTRATE 50 MG PO TABS: 75.0000 mg | ORAL_TABLET | Freq: Two times a day (BID) | ORAL | Status: DC

## 2015-08-23 NOTE — Progress Notes (Signed)
Quick Note:  NOTE: This forensic urine drug screen (UDS) test was conducted using a state-of-the-art ultra high performance liquid chromatography and mass spectrometry system (UPLC/MS-MS), the most sophisticated and accurate method available. UPLC/MS-MS is 1,000 times more precise and accurate than standard gas chromatography and mass spectrometry (GC/MS). This system can analyze 26 drug categories and 180 drug compounds.  The results of this test came back with unexpected findings. Unreported Benzodiazepine. ______

## 2015-08-31 DIAGNOSIS — I4891 Unspecified atrial fibrillation: Secondary | ICD-10-CM | POA: Diagnosis not present

## 2015-09-02 ENCOUNTER — Encounter: Payer: Self-pay | Admitting: Pain Medicine

## 2015-09-02 ENCOUNTER — Ambulatory Visit: Payer: Medicare Other | Attending: Pain Medicine | Admitting: Pain Medicine

## 2015-09-02 VITALS — BP 150/58 | HR 104 | Temp 97.6°F | Resp 16 | Ht 61.0 in | Wt 224.0 lb

## 2015-09-02 DIAGNOSIS — I080 Rheumatic disorders of both mitral and aortic valves: Secondary | ICD-10-CM | POA: Insufficient documentation

## 2015-09-02 DIAGNOSIS — Z7901 Long term (current) use of anticoagulants: Secondary | ICD-10-CM | POA: Insufficient documentation

## 2015-09-02 DIAGNOSIS — M87852 Other osteonecrosis, left femur: Secondary | ICD-10-CM | POA: Insufficient documentation

## 2015-09-02 DIAGNOSIS — M4806 Spinal stenosis, lumbar region: Secondary | ICD-10-CM | POA: Insufficient documentation

## 2015-09-02 DIAGNOSIS — F40240 Claustrophobia: Secondary | ICD-10-CM | POA: Diagnosis not present

## 2015-09-02 DIAGNOSIS — M87052 Idiopathic aseptic necrosis of left femur: Secondary | ICD-10-CM

## 2015-09-02 DIAGNOSIS — E785 Hyperlipidemia, unspecified: Secondary | ICD-10-CM | POA: Insufficient documentation

## 2015-09-02 DIAGNOSIS — M1612 Unilateral primary osteoarthritis, left hip: Secondary | ICD-10-CM | POA: Diagnosis not present

## 2015-09-02 DIAGNOSIS — E039 Hypothyroidism, unspecified: Secondary | ICD-10-CM | POA: Diagnosis not present

## 2015-09-02 DIAGNOSIS — K219 Gastro-esophageal reflux disease without esophagitis: Secondary | ICD-10-CM | POA: Diagnosis not present

## 2015-09-02 DIAGNOSIS — G8929 Other chronic pain: Secondary | ICD-10-CM | POA: Insufficient documentation

## 2015-09-02 DIAGNOSIS — M25552 Pain in left hip: Secondary | ICD-10-CM | POA: Diagnosis not present

## 2015-09-02 DIAGNOSIS — M533 Sacrococcygeal disorders, not elsewhere classified: Secondary | ICD-10-CM | POA: Diagnosis not present

## 2015-09-02 DIAGNOSIS — I4891 Unspecified atrial fibrillation: Secondary | ICD-10-CM | POA: Diagnosis not present

## 2015-09-02 DIAGNOSIS — M7062 Trochanteric bursitis, left hip: Secondary | ICD-10-CM | POA: Diagnosis not present

## 2015-09-02 DIAGNOSIS — R05 Cough: Secondary | ICD-10-CM | POA: Insufficient documentation

## 2015-09-02 DIAGNOSIS — R202 Paresthesia of skin: Secondary | ICD-10-CM | POA: Diagnosis not present

## 2015-09-02 DIAGNOSIS — Z950 Presence of cardiac pacemaker: Secondary | ICD-10-CM | POA: Insufficient documentation

## 2015-09-02 DIAGNOSIS — F4310 Post-traumatic stress disorder, unspecified: Secondary | ICD-10-CM | POA: Insufficient documentation

## 2015-09-02 DIAGNOSIS — M5126 Other intervertebral disc displacement, lumbar region: Secondary | ICD-10-CM | POA: Diagnosis not present

## 2015-09-02 DIAGNOSIS — R1030 Lower abdominal pain, unspecified: Secondary | ICD-10-CM | POA: Diagnosis present

## 2015-09-02 DIAGNOSIS — Z6841 Body Mass Index (BMI) 40.0 and over, adult: Secondary | ICD-10-CM | POA: Insufficient documentation

## 2015-09-02 DIAGNOSIS — I10 Essential (primary) hypertension: Secondary | ICD-10-CM | POA: Diagnosis not present

## 2015-09-02 DIAGNOSIS — M25559 Pain in unspecified hip: Secondary | ICD-10-CM | POA: Diagnosis present

## 2015-09-02 DIAGNOSIS — E669 Obesity, unspecified: Secondary | ICD-10-CM | POA: Insufficient documentation

## 2015-09-02 MED ORDER — OXYCODONE HCL 5 MG PO TABS: 5.0000 mg | ORAL_TABLET | Freq: Every day | ORAL | Status: DC | PRN

## 2015-09-02 MED ORDER — LIDOCAINE HCL (PF) 1 % IJ SOLN: 10.0000 mL | Freq: Once | INTRAMUSCULAR | Status: DC

## 2015-09-02 MED ORDER — METHYLPREDNISOLONE ACETATE 80 MG/ML IJ SUSP: 80.0000 mg | Freq: Once | INTRAMUSCULAR | Status: DC

## 2015-09-02 MED ORDER — ROPIVACAINE HCL 2 MG/ML IJ SOLN: 9.0000 mL | Freq: Once | INTRAMUSCULAR | Status: DC

## 2015-09-02 NOTE — Progress Notes (Signed)
Safety precautions to be maintained throughout the outpatient stay will include: orient to surroundings, keep bed in low position, maintain call bell within reach at all times, provide assistance with transfer out of bed and ambulation.  09/02/2015   patient states that she has stopped her coumadin for the last two days due to elevated labs per her PCP.

## 2015-09-02 NOTE — Patient Instructions (Signed)
You were given a prescription for Oxycodone today. 

## 2015-09-02 NOTE — Progress Notes (Signed)
Patient's Name: [name]  Patient type: Established  MRN: [id number]  Service setting: Ambulatory outpatient  DOB: November 21, 1940  Location: [facility]  DOS: 09/02/2015  Primary Care Physician: Malachi Carl, MD  Note by: Kathlen Brunswick. Dossie Arbour, M.D, DABA, DABAPM, DABPM, DABIPP, [place]  Referring Physician: Milinda Pointer, MD  Specialty: Board-Certified Interventional Pain Management  Last Visit to Pain Management: 08/11/2015   Primary Reason(s) for Visit: Encounter for prescription drug management (Level of risk: moderate) CC: Groin Pain and Hip Pain   HPI  [name] is a 75 y.o. year old, female patient, who returns today as an established patient. She has HYPOTHYROIDISM; HYPERLIPIDEMIA; OBESITY; Essential hypertension; SICK SINUS SYNDROME; Cardiac pacemaker in situ; Cough; GERD (gastroesophageal reflux disease); Atrial fibrillation ([place]); Chronic pain; Chronic hip pain (Location of Primary Source of Pain) (Left); Long term current use of opiate analgesic; Long term prescription opiate use; Opiate use (54 MME/Day); Encounter for therapeutic drug level monitoring; Disturbance of skin sensation; Osteoarthritis of hip (Left); Trochanteric bursitis of hip (Left); Lumbar central spinal stenosis (L4-5); Lumbar facet arthropathy; Osteoarthritis of sacroiliac joint (with vacuum phenomena in) (Bilateral); and Avascular necrosis of left femoral head (HCC) on her problem list.. Her primarily concern today is the Groin Pain and Hip Pain   Pain Assessment: Self-Reported Pain Score: 2              Reported level is compatible with observation       Pain Descriptors / Indicators: Constant, Aching Pain Frequency: Constant  The patient comes into the clinics today for diagnostic intra-articular hip joint injection. Unfortunately, it would appear that she forgot our instructions on how to stop her coumadin, or the nurse was not clear about that. The point is that she did not stop her  coumadin x 5 days and her coagulation parameters are elevated and she is at risk of bleeding and developing a hemarthrosis. Patient explained about the increased risks and she has decided to postpone. Today she comes in for her second visit and review of the results on the testing done. Her Hip x-rays have revealed an aseptic necrosis, which may need THR. I have arranged for her to have an orthopedic consult for evaluation. I last saw this patient on 08/11/2015. The patient  reports that she does not use illicit drugs. Her body mass index is 42.35 kg/(m^2).  Date of Last Visit: 08/11/15 Service Provided on Last Visit: Evaluation  Controlled Substance Pharmacotherapy Assessment & REMS (Risk Evaluation and Mitigation Strategy)  Analgesic: Codeine/APAP (Tylenol No. 4) 60/300 tablet every 4 hours (360 mg/day of codeine) (1800 mg/day of Tylenol) MME/day: 54 mg/day Pill Count: Patient was scheduled to come in today for diagnostic intra-articular hip joint injection today, therefore she was not told to bring any of her medications. Pharmacokinetics: Onset of action (Liberation/Absorption): Within expected pharmacological parameters Time to Peak effect (Distribution): Timing and results are as within normal expected parameters Duration of action (Metabolism/Excretion): Within normal limits for medication Pharmacodynamics: Analgesic Effect: More than 50% Activity Facilitation: Medication(s) allow patient to sit, stand, walk, and do the basic ADLs Perceived Effectiveness: Described as relatively effective, allowing for increase in activities of daily living (ADL) Side-effects or Adverse reactions: None reported Monitoring: [place] PMP: Online review of the past 56-month period conducted. Compliant with practice rules and regulations Initial UDS Testing:  SUMMARY  Date Value Ref Range Status  08/11/2015 FINAL  Final    Comment:     ==================================================================== TOXASSURE COMP DRUG ANALYSIS,UR ==================================================================== Test  Result       Flag       Units Drug Present and Declared for Prescription Verification   Codeine                        1060         EXPECTED   ng/mg creat   Morphine                       464          EXPECTED   ng/mg creat   Norcodeine                     63           EXPECTED   ng/mg creat    Sources of codeine include scheduled prescription medications;    morphine is an expected metabolite of codeine. Other sources of    morphine include scheduled prescription medications or as a    metabolite of heroin.    Norcodeine is an expected metabolite of codeine.   Cyclobenzaprine                PRESENT      EXPECTED   Acetaminophen                  PRESENT      EXPECTED   Metoprolol                     PRESENT      EXPECTED Drug Present not Declared for Prescription Verification   Desmethyldiazepam              57           UNEXPECTED ng/mg creat   Oxazepam                       174          UNEXPECTED ng/mg creat   Temazepam                      104          UNEXPECTED ng/mg creat    Desmethyldiazepam, oxazepam, and temazepam are benzodiazepine    drugs, but may also be present as common metabolites of other    benzodiazepine drugs, including diazepam. ==================================================================== Test                      Result    Flag   Units      Ref Range   Creatinine              109              mg/dL      >=20 ==================================================================== Declared Medications:  The flagging and interpretation on this report are based on the  following declared medications.  Unexpected results may arise from  inaccuracies in the declared medications.  **Note: The testing scope of this panel includes these medications:  Codeine  (Acetaminophen-Codeine)  Cyclobenzaprine  Metoprolol  **Note: The testing scope of this panel does not include small to  moderate amounts of these reported medications:  Acetaminophen (Acetaminophen-Codeine)  **Note: The testing scope of this panel does not include following  reported medications:  Colchicine  Hydrochlorothiazide (Triamterine-Hydrochlorthzide)  Levothyroxine  Liothyronine  Multivitamin  Potassium  Triamterene (Triamterine-Hydrochlorthzide)  Warfarin ==================================================================== For clinical consultation,  please call [phone]. ====================================================================    List of UDS test(s) done:  Lab Results  Component Value Date   SUMMARY FINAL 08/11/2015   UDS interpretation: Non-applicable as we have not started treatment with this patient yet. Patient informed of the CDC guidelines and recommendations to stay away from the concomitant use of benzodiazepines and opioids due to the increased risk of respiratory depression and death. Medication Assessment Form: Not applicable. Initial evaluation. The patient has not received any medications from our practice Treatment compliance: Not applicable. Initial evaluation Risk Assessment: Aberrant Behavior: None observed today Substance Use Disorder (SUD) Risk Level: Low Risk of opioid abuse or dependence: 0.7-3.0% with doses ? 36 MME/day and 6.1-26% with doses ? 120 MME/day. Opioid Risk Tool (ORT) Score:  0 Low Risk for SUD (Score <3) Depression Scale Score: PHQ-2: PHQ-2 Total Score: 0 No depression (0) PHQ-9: PHQ-9 Total Score: 0 No depression (0-4)  Pharmacologic Plan: Today we will take over the chronic pain medication management and from this point on our medication agreement with this patient is active. She will be started today on oxycodone IR 5mg , to be taken 1 tab PO, up to 5 times a day, PRN for pain. Patient instructed not to take if  not needed. Opioid safety information provided today.  Laboratory Chemistry  Inflammation Markers No results found for: ESRSEDRATE, CRP  Renal Function Lab Results  Component Value Date   BUN 37* 07/23/2015   CREATININE 1.14* 07/23/2015   GFRAA 53* 07/23/2015   GFRNONAA 46* 07/23/2015    Hepatic Function Lab Results  Component Value Date   AST 24 05/22/2009   ALT 20 05/22/2009   ALBUMIN 3.0* 05/22/2009    Electrolytes Lab Results  Component Value Date   NA 136 07/23/2015   K 4.1 07/23/2015   CL 99* 07/23/2015   CALCIUM 10.2 07/23/2015    Pain Modulating Vitamins No results found for: Maralyn Sago [id number], [id number], [id number], 25OHVITD1, 25OHVITD2, 25OHVITD3, VITAMINB12  Coagulation Parameters Lab Results  Component Value Date   INR 1.87* 07/23/2015   LABPROT 21.5* 07/23/2015   APTT 34 07/23/2015   PLT 181 07/23/2015    Note: Labs Reviewed. Results explained to patient in Layman's terms.  Recent Diagnostic Imaging  Ct Hip Left Wo Contrast  08/18/2015  CLINICAL DATA:  Chronic left hip pain. EXAM: CT OF THE LEFT HIP WITHOUT CONTRAST TECHNIQUE: Multidetector CT imaging of the left hip was performed according to the standard protocol. Multiplanar CT image reconstructions were also generated. COMPARISON:  None. FINDINGS: Bones/Joint/Cartilage Increased sclerosis in the superior left femoral head consistent with avascular necrosis with minimal articular surface collapse (image 28/series 602). No acute fracture or dislocation. Mild osteoarthritis of the left hip. Mild degenerative changes of the pubic symphysis. Normal alignment. No joint effusion. Muscles Normal. Soft tissue No fluid collection or hematoma.  No soft tissue mass. IMPRESSION: 1. Avascular necrosis of the left femoral head with mild articular surface collapse. Mild osteoarthritis of the left hip. Electronically Signed   By: Kathreen Devoid   On: 08/18/2015 13:12   Lumbosacral Imaging: Lumbar CT wo contrast:   Results for orders placed during the hospital encounter of 12/10/13  CT Lumbar Spine Wo Contrast   Narrative CLINICAL DATA:  Low back pain. Recently bent over and could not stand back up; evaluate for possible T10 or T12 deformity.  EXAM: CT LUMBAR SPINE WITHOUT CONTRAST  TECHNIQUE: Multidetector CT imaging of the lumbar spine was performed without intravenous contrast administration. Multiplanar CT image reconstructions  were also generated.  COMPARISON:  CT abdomen and pelvis sagittal reformatted images dated 05/22/2009. CT chest sagittal reformatted images dated 10/21/2010.  FINDINGS: Segmentation: Normal.  The scan extends from T9 downward.  Alignment:  Normal.  Vertebrae: There is sclerosis involving the anterior superior margin of T12 and the anterior inferior margin T11 without significant depression or deformity. Marked anterior spurring noted at T12-L1 eccentric to RIGHT. No compression fracture is seen involving lower thoracic or lumbar vertebrae. The appearance of the lower thoracic spine is unchanged from previous cross-sectional imaging. Coarsened trabecular markings suggest skeletal osteopenia.  Conus medullaris: Incompletely evaluated on this noncontrast CT scan. Small intradural calcification, opposite the L2-3 interspace, could represent a mild form of occult spinal dysraphism.  Paraspinal tissues: Pacemaker incompletely evaluated. Non aneurysmal atherosclerosis of the abdominal aorta. No hydronephrosis.  Disc levels:  Unremarkable appearing L1-2, L2-3, and L3-4 levels.  Mild central canal stenosis at L4-5 related to apparent central protrusion and facet arthropathy with ligamentum flavum hypertrophy. No definite impingement.  Unremarkable appearing L5-S1 level.  BILATERAL SI joint vacuum phenomenon.  IMPRESSION: No acute compression deformity is evident. The appearance of the lower thoracic spine is unchanged from previous CT abdomen and pelvis  2011.  Mild central canal stenosis is suspected at L4-5 related to shallow central protrusion and posterior element hypertrophy.   Electronically Signed   By: Rolla Flatten M.D.   On: 12/10/2013 14:03    Hip Imaging: Hip-L CT wo contrast:  Results for orders placed during the hospital encounter of 08/18/15  CT Hip Left Wo Contrast   Narrative CLINICAL DATA:  Chronic left hip pain.  EXAM: CT OF THE LEFT HIP WITHOUT CONTRAST  TECHNIQUE: Multidetector CT imaging of the left hip was performed according to the standard protocol. Multiplanar CT image reconstructions were also generated.  COMPARISON:  None.  FINDINGS: Bones/Joint/Cartilage  Increased sclerosis in the superior left femoral head consistent with avascular necrosis with minimal articular surface collapse (image 28/series 602).  No acute fracture or dislocation.  Mild osteoarthritis of the left hip. Mild degenerative changes of the pubic symphysis. Normal alignment. No joint effusion.  Muscles  Normal.  Soft tissue No fluid collection or hematoma.  No soft tissue mass.  IMPRESSION: 1. Avascular necrosis of the left femoral head with mild articular surface collapse. Mild osteoarthritis of the left hip.   Electronically Signed   By: Kathreen Devoid   On: 08/18/2015 13:12    Hip-L DG 2-3 views:  Results for orders placed during the hospital encounter of 08/11/15  DG HIP UNILAT W OR W/O PELVIS 2-3 VIEWS LEFT   Narrative CLINICAL DATA:  Bursitis.  Pain.  EXAM: DG HIP (WITH OR WITHOUT PELVIS) 2-3V LEFT  COMPARISON:  CT 05/22/2009.  FINDINGS: Degenerative changes lumbar spine and both hips. Sclerotic changes noted left femoral head suggesting avascular necrosis . MRI of the left hip suggested for further evaluation. No evidence of fracture or dislocation.  IMPRESSION: 1. Sclerotic changes left femoral head suggesting avascular necrosis. MRI of the left hip suggested for further evaluation.  2.  Degenerative changes lumbar spine and both hips. No evidence of fracture or dislocation .   Electronically Signed   By: Marcello Moores  Register   On: 08/11/2015 16:56    Note: Imaging results reviewed and explained to patient in Layman's terms.  Meds  The patient has a current medication list which includes the following prescription(s): acetaminophen-codeine, colchicine, cyclobenzaprine, diazepam, levothyroxine, liothyronine, metoprolol, multivitamin with minerals, potassium chloride, triamterene-hydrochlorothiazide, warfarin, and  oxycodone, and the following Facility-Administered Medications: lidocaine (pf), methylprednisolone acetate, and ropivacaine (pf) 2 mg/ml (0.2%).  Current Outpatient Prescriptions on File Prior to Visit  Medication Sig  . acetaminophen-codeine (TYLENOL #4) 300-60 MG per tablet Take 1 tablet by mouth every 4 (four) hours as needed (pain).   . colchicine 0.6 MG tablet Take 0.6 mg by mouth daily as needed (GOUT).   . cyclobenzaprine (FLEXERIL) 5 MG tablet Take 10 mg by mouth at bedtime.  . diazepam (VALIUM) 10 MG tablet Take 10 mg by mouth every 6 (six) hours as needed for anxiety.  Marland Kitchen levothyroxine (SYNTHROID, LEVOTHROID) 50 MCG tablet Take 50 mcg by mouth every morning.  Marland Kitchen liothyronine (CYTOMEL) 5 MCG tablet Take 5 mcg by mouth daily.   . metoprolol (LOPRESSOR) 50 MG tablet Take 1.5 tablets (75 mg total) by mouth 2 (two) times daily.  . Multiple Vitamin (MULTIVITAMIN WITH MINERALS) TABS tablet Take 1 tablet by mouth daily.  . potassium chloride (MICRO-K) 10 MEQ CR capsule Take 10 mEq by mouth 2 (two) times daily.  Marland Kitchen triamterene-hydrochlorothiazide (MAXZIDE) 75-50 MG per tablet Take 1 tablet by mouth daily.  Marland Kitchen warfarin (COUMADIN) 5 MG tablet Take 2.5-5 mg by mouth daily. Sun-0.5 half tab (2.5 mg total); All other days-1 tab (5 mg total)   No current facility-administered medications on file prior to visit.    ROS  Constitutional: Denies any fever or  chills Gastrointestinal: No reported hemesis, hematochezia, vomiting, or acute GI distress Musculoskeletal: Denies any acute onset joint swelling, redness, loss of ROM, or weakness Neurological: No reported episodes of acute onset apraxia, aphasia, dysarthria, agnosia, amnesia, paralysis, loss of coordination, or loss of consciousness  Allergies  [name] is allergic to contrast media.  [place]  Medical:  [name]  has a past medical history of Hypothyroidism; Hyperlipidemia; Hypertension; Paroxysmal atrial fibrillation ([place]); Obesity; Sick sinus syndrome ([place]); Aortic insufficiency; Mitral regurgitation; Bronchitis; Rhinitis; Arthritis; Asthma; Sleep apnea; Anxiety; PTSD (post-traumatic stress disorder); GERD (gastroesophageal reflux disease); Bursitis, hip; and Claustrophobia. Family: family history includes Alzheimer's disease in her father; Arthritis in her sister; Cancer in her brother and maternal grandfather; Heart disease in her mother; Hypertension in her sister; Stroke in her mother. Surgical:  has past surgical history that includes Tonsillectomy; Pacemaker insertion; Status post child birth x1; Rotator cuff repair; and Dilatation & curettage/hysteroscopy with myosure (N/A, 08/03/2015). Tobacco:  reports that she has never smoked. She has never used smokeless tobacco. Alcohol:  reports that she does not drink alcohol. Drug:  reports that she does not use illicit drugs.  Constitutional Exam  Vitals: Blood pressure 150/58, pulse 104, temperature 97.6 F (36.4 C), temperature source Oral, resp. rate 16, height 5\' 1"  (1.549 m), weight 224 lb (101.606 kg), SpO2 100 %. General appearance: Well nourished, well developed, and well hydrated. In no acute distress Calculated BMI/Body habitus: Body mass index is 42.35 kg/(m^2). (>40 kg/m2) Extreme obesity (Class III) - 254% higher incidence of chronic pain Psych/Mental status: Alert and oriented x 3 (person, place, & time) Eyes:  PERLA Respiratory: No evidence of acute respiratory distress  Cervical Spine Exam  Inspection: No masses, redness, or swelling Alignment: Symmetrical ROM: Functional: ROM is within functional limits [facility]) Stability: No instability detected Muscle strength & Tone: Functionally intact Sensory: Unimpaired Palpation: No complaints of tenderness  Upper Extremity (UE) Exam    Side: Right upper extremity  Side: Left upper extremity  Inspection: No masses, redness, swelling, or asymmetry  Inspection: No masses, redness, swelling, or asymmetry  ROM:  ROM:  Functional: ROM is within functional limits [facility])        Functional: ROM is within functional limits [facility])        Muscle strength & Tone: Functionally intact  Muscle strength & Tone: Functionally intact  Sensory: Unimpaired  Sensory: Unimpaired  Palpation: No complaints of tenderness  Palpation: No complaints of tenderness   Thoracic Spine Exam  Inspection: No masses, redness, or swelling Alignment: Symmetrical ROM: Functional: ROM is within functional limits [facility]) Stability: No instability detected Sensory: Unimpaired Muscle strength & Tone: Functionally intact Palpation: No complaints of tenderness  Lumbar Spine Exam  Inspection: No masses, redness, or swelling Alignment: Symmetrical ROM: Functional: Decreased ROM Stability: No instability detected Muscle strength & Tone: Functionally intact Sensory: Movement-associated pain Palpation: Area tender to palpation Provocative Tests: Lumbar Hyperextension and rotation test: Positive bilaterally for facet joint pain. Patrick's Maneuver: Positive for bilateral S-I joint pain and for bilateral hip joint pain.  Gait & Posture Assessment  Ambulation: Patient ambulates using a cane Gait: Very limited, using assistive device to ambulate Posture: WNL   Lower Extremity Exam    Side: Right lower extremity  Side: Left lower extremity  Inspection: No masses, redness, swelling, or  asymmetry ROM:  Inspection: No masses, redness, swelling, or asymmetry ROM:  Functional: Decreased ROM Due to hip arthropathy  Functional: Decreased ROM Due to hip arthropathy  Muscle strength & Tone: Functionally intact  Muscle strength & Tone: Functionally intact  Sensory: Movement-associated pain  Sensory: Movement-associated pain  Palpation: No complaints of tenderness  Palpation: No complaints of tenderness   Assessment & Plan  Primary Diagnosis & Pertinent Problem List: The primary encounter diagnosis was Chronic left hip pain. Diagnoses of Primary osteoarthritis of left hip, Trochanteric bursitis of hip (Left), Avascular necrosis of left femoral head ([place]), and Chronic pain were also pertinent to this visit.  Visit Diagnosis: 1. Chronic left hip pain   2. Primary osteoarthritis of left hip   3. Trochanteric bursitis of hip (Left)   4. Avascular necrosis of left femoral head (HCC)   5. Chronic pain     Problems updated and reviewed during this visit: No problems updated.  Problem-specific Plan(s): No problem-specific assessment & plan notes found for this encounter.  No new assessment & plan notes have been filed under this hospital service since the last note was generated. Service: Pain Management   Plan of Care   Problem List Items Addressed This Visit      High   Avascular necrosis of left femoral head (HCC) (Chronic)   Relevant Orders   Ambulatory referral to Orthopedic Surgery   Chronic hip pain (Location of Primary Source of Pain) (Left) - Primary (Chronic)   Chronic pain (Chronic)   Relevant Medications   methylPREDNISolone acetate (DEPO-MEDROL) injection 80 mg   lidocaine (PF) (XYLOCAINE) 1 % injection 10 mL   ropivacaine (PF) 2 mg/ml (0.2%) (NAROPIN) epidural 9 mL   oxyCODONE (OXY IR/ROXICODONE) 5 MG immediate release tablet   Osteoarthritis of hip (Left) (Chronic)   Relevant Medications   methylPREDNISolone acetate (DEPO-MEDROL) injection 80 mg    lidocaine (PF) (XYLOCAINE) 1 % injection 10 mL   ropivacaine (PF) 2 mg/ml (0.2%) (NAROPIN) epidural 9 mL   oxyCODONE (OXY IR/ROXICODONE) 5 MG immediate release tablet   Trochanteric bursitis of hip (Left) (Chronic)       Pharmacotherapy (Medications Ordered): Meds ordered this encounter  Medications  . methylPREDNISolone acetate (DEPO-MEDROL) injection 80 mg    Sig:   .  lidocaine (PF) (XYLOCAINE) 1 % injection 10 mL    Sig:   . ropivacaine (PF) 2 mg/ml (0.2%) (NAROPIN) epidural 9 mL    Sig:   . oxyCODONE (OXY IR/ROXICODONE) 5 MG immediate release tablet    Sig: Take 1 tablet (5 mg total) by mouth 5 (five) times daily as needed for severe pain.    Dispense:  150 tablet    Refill:  0    Do not place this medication, or any other prescription from our practice, on "Automatic Refill". Patient may have prescription filled one day early if pharmacy is closed on scheduled refill date. Do not fill until: 09/02/15 To last until: 10/02/15    [facility] & Procedure Ordered: Orders Placed This Encounter  Procedures  . Ambulatory referral to Orthopedic Surgery    Imaging Ordered: AMB REFERRAL TO ORTHOPEDIC SURGERY for possible left total hip arthroplasty due to avascular necrosis.  Interventional Therapies: (Always stop coumadin x 5 days prior to procedures.) Scheduled:  1. Diagnostic left intra-articular hip joint injection under fluoroscopic guidance, with a without sedation, after I return from vacation. Stop coumadin x 5 days prior to procedure. 2. Possible left trochanteric bursa injection under fluoroscopic guidance, with a without sedation.    Considering:  1. Diagnostic left intra-articular hip joint injection under fluoroscopic guidance, with a without sedation.  2. Possible left hip radiofrequency ablation under fluoroscopic guidance and IV sedation depending on the results of the diagnostic injection.  3. Diagnostic left-sided L4-5 lumbar epidural steroid injection  under fluoroscopic guidance, with a without sedation.  4. Diagnostic left transforaminal epidural steroid injection under fluoroscopic guidance, with or without sedation.  5. Diagnostic left sided sacroiliac joint block under fluoroscopic guidance, with a without sedation.  6. Possible left sided sacroiliac joint radiofrequency ablation under fluoroscopic guidance and IV sedation depending on the results of the diagnostic injection.    PRN Procedures:  1. Palliative left intra-articular hip joint injection under fluoroscopic guidance, with a without sedation.  2. Palliative left trochanteric bursa injection under fluoroscopic guidance, with or without sedation.        Referral(s) or Consult(s): Ambulatory referral to Orthopedic Surgery for possible left hip Total Arthroplasty due to aseptic necrosis and severe pain.  New Prescriptions   OXYCODONE (OXY IR/ROXICODONE) 5 MG IMMEDIATE RELEASE TABLET    Take 1 tablet (5 mg total) by mouth 5 (five) times daily as needed for severe pain.    Medications administered during this visit: [name] had no medications administered during this visit.  Requested PM Follow-up: Return in about 1 month (around 10/03/2015) for (1-Mo), Med-Mgmt.  Future Appointments Date Time Provider [place]  09/30/2015 9:00 AM Milinda Pointer, MD [facility] None    Primary Care Physician: Malachi Carl, MD Location: [facility] Facility Note by: Kathlen Brunswick. Dossie Arbour, M.D, DABA, DABAPM, DABPM, DABIPP, FIPP  Pain Score Disclaimer: We use the NRS-11 scale. This is a self-reported, subjective measurement of pain severity with only modest accuracy. It is used primarily to identify changes within a particular patient. It must be understood that outpatient pain scales are significantly less accurate that those used for research, where they can be applied under ideal controlled circumstances with minimal exposure to variables. In reality,  the score is likely to be a combination of pain intensity and pain affect, where pain affect describes the degree of emotional arousal or changes in action readiness caused by the sensory experience of pain. Factors such as social and work situation, setting, emotional  state, anxiety levels, expectation, and prior pain experience may influence pain perception and show large inter-individual differences that may also be affected by time variables.  Patient instructions provided during this appointment: Patient Instructions  You were given a prescription for Oxycodone today.

## 2015-09-02 NOTE — Progress Notes (Deleted)
Patient's Name: [name]  Patient type: Established  MRN: [id number]  Service setting: Ambulatory outpatient  DOB: 12-13-1940  Location: [facility] Out[facility] Pain [facility] Facility  DOS: 09/02/2015  Primary Care Physician: Malachi Carl, MD  Note by: Kathlen Brunswick. Dossie Arbour, M.D, DABA, DABAPM, DABPM, DABIPP, FIPP  Referring Physician: Milinda Pointer, MD  Specialty: Board-Certified Interventional Pain Management  Last Visit to Pain Management: 08/11/2015   Primary Reason(s) for Visit: Interventional Pain Management Treatment. CC: Groin Pain and Hip Pain  No Primary Diagnosis found   Procedure:  Anesthesia, Analgesia, Anxiolysis:  Type: Diagnostic Intra-Articular Hip Injection Region:  Posterolateral hip joint area. Level: Lower pelvic and hip joint level. Laterality: Left-Sided  Indications: 1. Chronic left hip pain   2. Primary osteoarthritis of left hip   3. Trochanteric bursitis of hip (Left)     Pre-procedure Pain Score: 2/10 Reported level of pain is compatible with clinical observations Post-procedure Pain Score: 2   Type: Moderate (Conscious) Sedation & Local Anesthesia Local Anesthetic: Lidocaine 1% Route: Intravenous (IV) IV Access: Secured Sedation: Meaningful verbal contact was maintained at all times during the procedure  Indication(s): Analgesia     Pre-Procedure Assessment  Ms. Wojdyla is a 75 y.o. year old, female patient, seen today for interventional treatment. 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 Her primarily concern today is the Groin Pain and Hip Pain   Pain Descriptors / Indicators: Constant, Aching Pain Frequency: Constant  Date of Last Visit: 08/11/15 Service Provided on Last Visit: Evaluation  Coagulation Parameters Lab Results  Component Value Date   INR 1.87* 07/23/2015   LABPROT 21.5* 07/23/2015   APTT 34 07/23/2015   PLT 181 07/23/2015    Verification of the correct person, correct site (including marking of site), and correct procedure were performed and confirmed by the patient.  Consent: Secured. Under the influence of no sedatives a written informed consent was obtained, after having provided information on the risks and possible complications. To fulfill our ethical and legal obligations, as recommended by the American Medical Association's Code of Ethics, we have provided information to the patient about our clinical impression; the nature and purpose of the treatment or procedure; the risks, benefits, and possible complications of the intervention; alternatives; the risk(s) and benefit(s) of the alternative treatment(s) or procedure(s); and the risk(s) and benefit(s) of doing nothing. The patient was provided information about the risks and possible complications associated with the procedure. These include, but are not limited to, failure to achieve desired goals, infection, bleeding, organ or nerve damage, allergic reactions, paralysis, and death. In the case of intra- or periarticular procedures these may include, but are not limited to, failure to achieve desired goals, infection, bleeding (hemarthrosis), organ or nerve damage, allergic reactions, and death. In addition, the patient was informed that Medicine is not an exact science; therefore, there is also  the possibility of unforeseen risks and possible complications that may result in a catastrophic outcome. The patient indicated having understood  very clearly. We have given the patient no guarantees and we have made no promises. Enough time was given to the patient to ask questions, all of which were answered to the patient's satisfaction.  Consent Attestation: I, the ordering provider, attest that I have discussed with the patient the benefits, risks, side-effects, alternatives, likelihood of achieving goals, and potential problems during recovery for the procedure that I have provided informed consent.  Pre-Procedure Preparation: Safety Precautions: Allergies reviewed. Appropriate site, procedure, and patient were confirmed by following the Joint Commission's Universal Protocol (UP.01.01.01), in the form of a "Time Out". The patient was asked to confirm marked site and procedure, before commencing. The patient was asked about blood thinners, or active infections, both of which were denied. Patient was assessed for positional comfort and all pressure points were checked before starting procedure. Allergies: She is allergic to contrast media.. Infection Control Precautions: Sterile technique used. Standard Universal Precautions were taken as recommended by the Department of [facility] for Disease Control and Prevention (CDC). Standard pre-surgical skin prep was conducted. Respiratory hygiene and cough etiquette was practiced. Hand hygiene observed. Safe injection practices and needle disposal techniques followed. SDV (single dose vial) medications used. Medications properly checked for expiration dates and contaminants. Personal protective equipment (PPE) used: Sterile Radiation-resistant gloves. Monitoring:  As per clinic protocol. Filed Vitals:   09/02/15 1338  BP: 150/58  Pulse: 104  Temp: 97.6 F (36.4 C)  TempSrc: Oral  Resp: 16  Height: _0  (1.549 m)  Weight: 224 lb (101.606  kg)  SpO2: 100%  Calculated BMI: Body mass index is 42.35 kg/(m^2).  Description of Procedure Process:   Time-out: "Time-out" completed before starting procedure, as per protocol. Position: Prone Target Area: Superior aspect of the hip joint cavity, going thru the superior portion of the capsular ligament. Approach: Posterolateral approach. Area Prepped: Entire Posterolateral hip area. Prepping solution: ChloraPrep (2% chlorhexidine gluconate and 70% isopropyl alcohol) Safety Precautions: Aspiration looking for blood return was conducted prior to all injections. At no point did we inject any substances, as a needle was being advanced. No attempts were made at seeking any paresthesias. Safe injection practices and needle disposal techniques used. Medications properly checked for expiration dates. SDV (single dose vial) medications used. Contrast Allergy precautions taken.   Description of the Procedure: Protocol guidelines were followed. The patient was placed in position over the fluoroscopy table. The target area was identified and the area prepped in the usual manner. Skin & deeper tissues infiltrated with local anesthetic. Appropriate amount of time allowed to pass for local anesthetics to take effect. The procedure needles were then advanced to the target area. Proper needle placement secured. Negative aspiration confirmed. Solution injected in intermittent fashion, asking for systemic symptoms every 0.5cc of injectate. The needles were then removed and the area cleansed, making sure to leave some of the prepping solution back to take advantage of its long term bactericidal properties. EBL: Minimal Materials & Medications Used:  Needle(s) Used: 22g - 7" Spinal Needle(s) Solution Injected: 0.2% PF-Ropivacaine (50m) + SDV-DepoMedrol 80 mg/ml (120m Medications Administered today: Ms. BuTappanoes not currently have medications on file.Please see chart orders for dosing details.  Imaging Guidance:    Type of Imaging Technique: Fluoroscopy Guidance (Non-spinal) Indication(s): Assistance in needle guidance and placement for procedures requiring needle placement in or near specific anatomical locations not easily accessible without such assistance. Exposure Time: Please see nurses notes. Contrast: Patient allergic to contrast dye. None used. Fluoroscopic Guidance: I was  personally present in the fluoroscopy suite, where the patient was placed in position for the procedure, over the fluoroscopy-compatible table. Fluoroscopy was manipulated, using "Tunnel Vision Technique", to obtain the best possible view of the target area, on the affected side. Parallax error was corrected before commencing the procedure. A "direction-depth-direction" technique was used to introduce the needle under continuous pulsed fluoroscopic guidance. Once the target was reached, antero-posterior, oblique, and lateral fluoroscopic projection views were taken to confirm needle placement in all planes. Permanently recorded images stored by scanning into EMR. Interpretation: No contrast injected. Intraoperative imaging interpretation by performing Physician. Adequate needle placement confirmed in AP, Lateral, & Oblique Views. Permanent images scanned into the patient's record.  Antibiotic Prophylaxis:  Indication(s): No indications identified. Type:  Antibiotics Given (last 72 hours)    None       Post-operative Assessment:   Complications: No immediate post-treatment complications were observed. Disposition: The patient was discharged home, once institutional criteria were met. Return to clinic in 2 weeks for follow-up evaluation and interpretation of results. The patient tolerated the entire procedure well. A repeat set of vitals were taken after the procedure and the patient was kept under observation following institutional policy, for this type of procedure. Post-procedural neurological assessment was performed, showing  return to baseline, prior to discharge. The patient was provided with post-procedure discharge instructions, including a section on how to identify potential problems. Should any problems arise concerning this procedure, the patient was given instructions to immediately contact us, at any time, without hesitation. In any case, we plan to contact the patient by telephone for a follow-up status report regarding this interventional procedure. Comments:  No additional relevant information.  Medications administered during this visit: Ms. Reza does not currently have medications on file.  Prescriptions ordered during this visit: New Prescriptions   No medications on file    Requested PM Follow-up: No Follow-up on file.  No future appointments.  Primary Care Physician: Malachi Carl, MD Location: [facility] Management Facility Note by: Kathlen Brunswick. Dossie Arbour, M.D, DABA, DABAPM, DABPM, DABIPP, FIPP  Disclaimer:  Medicine is not an exact science. The only guarantee in medicine is that nothing is guaranteed. It is important to note that the decision to proceed with this intervention was based on the information collected from the patient. The Data and conclusions were drawn from the patient's questionnaire, the interview, and the physical examination. Because the information was provided in large part by the patient, it cannot be guaranteed that it has not been purposely or unconsciously manipulated. Every effort has been made to obtain as much relevant data as possible for this evaluation. It is important to note that the conclusions that lead to this procedure are derived in large part from the available data. Always take into account that the treatment will also be dependent on availability of resources and existing treatment guidelines, considered by other Pain Management Practitioners as being common knowledge and practice, at the time of the intervention. For Medico-Legal purposes, it is  also important to point out that variation in procedural techniques and pharmacological choices are the acceptable norm. The indications, contraindications, technique, and results of the above procedure should only be interpreted and judged by a Board-Certified Interventional Pain Specialist with extensive familiarity and expertise in the same exact procedure and technique. Attempts at providing opinions without similar or greater experience and expertise than that of the treating physician will be considered as inappropriate and unethical, and shall result in a formal complaint to  the state medical board and applicable specialty societies.

## 2015-09-03 ENCOUNTER — Telehealth: Payer: Self-pay | Admitting: *Deleted

## 2015-09-03 NOTE — Telephone Encounter (Signed)
Patient did not have procedure d/t being on blood thinners.

## 2015-09-06 NOTE — Telephone Encounter (Signed)
error 

## 2015-09-28 DIAGNOSIS — I4891 Unspecified atrial fibrillation: Secondary | ICD-10-CM | POA: Diagnosis not present

## 2015-09-30 ENCOUNTER — Encounter: Payer: Self-pay | Admitting: Pain Medicine

## 2015-09-30 ENCOUNTER — Encounter: Payer: Self-pay | Admitting: Internal Medicine

## 2015-09-30 ENCOUNTER — Ambulatory Visit: Payer: Medicare Other | Attending: Pain Medicine | Admitting: Pain Medicine

## 2015-09-30 ENCOUNTER — Other Ambulatory Visit
Admission: RE | Admit: 2015-09-30 | Discharge: 2015-09-30 | Disposition: A | Discharge status: Home / Self Care | Payer: Medicare Other | Source: Ambulatory Visit | Attending: Pain Medicine | Admitting: Pain Medicine

## 2015-09-30 VITALS — BP 139/54 | HR 73 | Temp 97.8°F | Resp 16 | Ht 61.0 in | Wt 222.0 lb

## 2015-09-30 DIAGNOSIS — G4730 Sleep apnea, unspecified: Secondary | ICD-10-CM | POA: Diagnosis not present

## 2015-09-30 DIAGNOSIS — K5903 Drug induced constipation: Secondary | ICD-10-CM | POA: Insufficient documentation

## 2015-09-30 DIAGNOSIS — M87052 Idiopathic aseptic necrosis of left femur: Secondary | ICD-10-CM | POA: Insufficient documentation

## 2015-09-30 DIAGNOSIS — M879 Osteonecrosis, unspecified: Secondary | ICD-10-CM | POA: Diagnosis not present

## 2015-09-30 DIAGNOSIS — Z950 Presence of cardiac pacemaker: Secondary | ICD-10-CM | POA: Diagnosis not present

## 2015-09-30 DIAGNOSIS — G8929 Other chronic pain: Secondary | ICD-10-CM

## 2015-09-30 DIAGNOSIS — I495 Sick sinus syndrome: Secondary | ICD-10-CM | POA: Insufficient documentation

## 2015-09-30 DIAGNOSIS — I10 Essential (primary) hypertension: Secondary | ICD-10-CM | POA: Diagnosis not present

## 2015-09-30 DIAGNOSIS — M4806 Spinal stenosis, lumbar region: Secondary | ICD-10-CM | POA: Diagnosis not present

## 2015-09-30 DIAGNOSIS — M25552 Pain in left hip: Secondary | ICD-10-CM | POA: Diagnosis present

## 2015-09-30 DIAGNOSIS — E039 Hypothyroidism, unspecified: Secondary | ICD-10-CM | POA: Insufficient documentation

## 2015-09-30 DIAGNOSIS — Z79891 Long term (current) use of opiate analgesic: Secondary | ICD-10-CM | POA: Diagnosis not present

## 2015-09-30 DIAGNOSIS — I4891 Unspecified atrial fibrillation: Secondary | ICD-10-CM | POA: Diagnosis not present

## 2015-09-30 DIAGNOSIS — Z6841 Body Mass Index (BMI) 40.0 and over, adult: Secondary | ICD-10-CM | POA: Insufficient documentation

## 2015-09-30 DIAGNOSIS — J45909 Unspecified asthma, uncomplicated: Secondary | ICD-10-CM | POA: Insufficient documentation

## 2015-09-30 DIAGNOSIS — M533 Sacrococcygeal disorders, not elsewhere classified: Secondary | ICD-10-CM | POA: Diagnosis not present

## 2015-09-30 DIAGNOSIS — E785 Hyperlipidemia, unspecified: Secondary | ICD-10-CM | POA: Diagnosis not present

## 2015-09-30 DIAGNOSIS — R209 Unspecified disturbances of skin sensation: Secondary | ICD-10-CM | POA: Diagnosis not present

## 2015-09-30 DIAGNOSIS — F1190 Opioid use, unspecified, uncomplicated: Secondary | ICD-10-CM

## 2015-09-30 DIAGNOSIS — M7062 Trochanteric bursitis, left hip: Secondary | ICD-10-CM | POA: Diagnosis not present

## 2015-09-30 DIAGNOSIS — E669 Obesity, unspecified: Secondary | ICD-10-CM | POA: Insufficient documentation

## 2015-09-30 DIAGNOSIS — T402X5A Adverse effect of other opioids, initial encounter: Secondary | ICD-10-CM | POA: Insufficient documentation

## 2015-09-30 DIAGNOSIS — R001 Bradycardia, unspecified: Secondary | ICD-10-CM | POA: Diagnosis not present

## 2015-09-30 DIAGNOSIS — M1612 Unilateral primary osteoarthritis, left hip: Secondary | ICD-10-CM | POA: Diagnosis not present

## 2015-09-30 DIAGNOSIS — R200 Anesthesia of skin: Secondary | ICD-10-CM | POA: Insufficient documentation

## 2015-09-30 DIAGNOSIS — K219 Gastro-esophageal reflux disease without esophagitis: Secondary | ICD-10-CM | POA: Diagnosis not present

## 2015-09-30 DIAGNOSIS — F4310 Post-traumatic stress disorder, unspecified: Secondary | ICD-10-CM | POA: Insufficient documentation

## 2015-09-30 LAB — VITAMIN B12: Vitamin B-12: >7500 pg/mL — ABNORMAL HIGH (ref 180–914)

## 2015-09-30 MED ORDER — OXYCODONE HCL 5 MG PO TABS: 5.0000 mg | ORAL_TABLET | Freq: Every day | ORAL | 0 refills | Status: DC | PRN

## 2015-09-30 MED ORDER — LUBIPROSTONE 8 MCG PO CAPS: 8.0000 ug | ORAL_CAPSULE | Freq: Two times a day (BID) | ORAL | 0 refills | Status: DC

## 2015-09-30 MED ORDER — BENEFIBER PO POWD: ORAL | 99 refills | Status: DC

## 2015-09-30 NOTE — Patient Instructions (Addendum)
GENERAL RISKS AND COMPLICATIONS  What are the risk, side effects and possible complications? Generally speaking, most procedures are safe.  However, with any procedure there are risks, side effects, and the possibility of complications.  The risks and complications are dependent upon the sites that are lesioned, or the type of nerve block to be performed.  The closer the procedure is to the spine, the more serious the risks are.  Great care is taken when placing the radio frequency needles, block needles or lesioning probes, but sometimes complications can occur. 1. Infection: Any time there is an injection through the skin, there is a risk of infection.  This is why sterile conditions are used for these blocks.  There are four possible types of infection. 1. Localized skin infection. 2. Central Nervous System Infection-This can be in the form of Meningitis, which can be deadly. 3. Epidural Infections-This can be in the form of an epidural abscess, which can cause pressure inside of the spine, causing compression of the spinal cord with subsequent paralysis. This would require an emergency surgery to decompress, and there are no guarantees that the patient would recover from the paralysis. 4. Discitis-This is an infection of the intervertebral discs.  It occurs in about 1% of discography procedures.  It is difficult to treat and it may lead to surgery.        2. Pain: the needles have to go through skin and soft tissues, will cause soreness.       3. Damage to internal structures:  The nerves to be lesioned may be near blood vessels or    other nerves which can be potentially damaged.       4. Bleeding: Bleeding is more common if the patient is taking blood thinners such as  aspirin, Coumadin, Ticiid, Plavix, etc., or if he/she have some genetic predisposition  such as hemophilia. Bleeding into the spinal canal can cause compression of the spinal  cord with subsequent paralysis.  This would require an  emergency surgery to  decompress and there are no guarantees that the patient would recover from the  paralysis.       5. Pneumothorax:  Puncturing of a lung is a possibility, every time a needle is introduced in  the area of the chest or upper back.  Pneumothorax refers to free air around the  collapsed lung(s), inside of the thoracic cavity (chest cavity).  Another two possible  complications related to a similar event would include: Hemothorax and Chylothorax.   These are variations of the Pneumothorax, where instead of air around the collapsed  lung(s), you may have blood or chyle, respectively.       6. Spinal headaches: They may occur with any procedures in the area of the spine.       7. Persistent CSF (Cerebro-Spinal Fluid) leakage: This is a rare problem, but may occur  with prolonged intrathecal or epidural catheters either due to the formation of a fistulous  track or a dural tear.       8. Nerve damage: By working so close to the spinal cord, there is always a possibility of  nerve damage, which could be as serious as a permanent spinal cord injury with  paralysis.       9. Death:  Although rare, severe deadly allergic reactions known as "Anaphylactic  reaction" can occur to any of the medications used.      10. Worsening of the symptoms:  We can always make thing worse.    What are the chances of something like this happening? Chances of any of this occuring are extremely low.  By statistics, you have more of a chance of getting killed in a motor vehicle accident: while driving to the hospital than any of the above occurring .  Nevertheless, you should be aware that they are possibilities.  In general, it is similar to taking a shower.  Everybody knows that you can slip, hit your head and get killed.  Does that mean that you should not shower again?  Nevertheless always keep in mind that statistics do not mean anything if you happen to be on the wrong side of them.  Even if a procedure has a 1  (one) in a 1,000,000 (million) chance of going wrong, it you happen to be that one..Also, keep in mind that by statistics, you have more of a chance of having something go wrong when taking medications.  Who should not have this procedure? If you are on a blood thinning medication (e.g. Coumadin, Plavix, see list of "Blood Thinners"), or if you have an active infection going on, you should not have the procedure.  If you are taking any blood thinners, please inform your physician.  How should I prepare for this procedure?  Do not eat or drink anything at least six hours prior to the procedure.  Bring a driver with you .  It cannot be a taxi.  Come accompanied by an adult that can drive you back, and that is strong enough to help you if your legs get weak or numb from the local anesthetic.  Take all of your medicines the morning of the procedure with just enough water to swallow them.  If you have diabetes, make sure that you are scheduled to have your procedure done first thing in the morning, whenever possible.  If you have diabetes, take only half of your insulin dose and notify our nurse that you have done so as soon as you arrive at the clinic.  If you are diabetic, but only take blood sugar pills (oral hypoglycemic), then do not take them on the morning of your procedure.  You may take them after you have had the procedure.  Do not take aspirin or any aspirin-containing medications, at least eleven (11) days prior to the procedure.  They may prolong bleeding.  Wear loose fitting clothing that may be easy to take off and that you would not mind if it got stained with Betadine or blood.  Do not wear any jewelry or perfume  Remove any nail coloring.  It will interfere with some of our monitoring equipment.  NOTE: Remember that this is not meant to be interpreted as a complete list of all possible complications.  Unforeseen problems may occur.  BLOOD THINNERS The following drugs  contain aspirin or other products, which can cause increased bleeding during surgery and should not be taken for 2 weeks prior to and 1 week after surgery.  If you should need take something for relief of minor pain, you may take acetaminophen which is found in Tylenol,m Datril, Anacin-3 and Panadol. It is not blood thinner. The products listed below are.  Do not take any of the products listed below in addition to any listed on your instruction sheet.  A.P.C or A.P.C with Codeine Codeine Phosphate Capsules #3 Ibuprofen Ridaura  ABC compound Congesprin Imuran rimadil  Advil Cope Indocin Robaxisal  Alka-Seltzer Effervescent Pain Reliever and Antacid Coricidin or Coricidin-D  Indomethacin Rufen    Alka-Seltzer plus Cold Medicine Cosprin Ketoprofen S-A-C Tablets  Anacin Analgesic Tablets or Capsules Coumadin Korlgesic Salflex  Anacin Extra Strength Analgesic tablets or capsules CP-2 Tablets Lanoril Salicylate  Anaprox Cuprimine Capsules Levenox Salocol  Anexsia-D Dalteparin Magan Salsalate  Anodynos Darvon compound Magnesium Salicylate Sine-off  Ansaid Dasin Capsules Magsal Sodium Salicylate  Anturane Depen Capsules Marnal Soma  APF Arthritis pain formula Dewitt's Pills Measurin Stanback  Argesic Dia-Gesic Meclofenamic Sulfinpyrazone  Arthritis Bayer Timed Release Aspirin Diclofenac Meclomen Sulindac  Arthritis pain formula Anacin Dicumarol Medipren Supac  Analgesic (Safety coated) Arthralgen Diffunasal Mefanamic Suprofen  Arthritis Strength Bufferin Dihydrocodeine Mepro Compound Suprol  Arthropan liquid Dopirydamole Methcarbomol with Aspirin Synalgos  ASA tablets/Enseals Disalcid Micrainin Tagament  Ascriptin Doan's Midol Talwin  Ascriptin A/D Dolene Mobidin Tanderil  Ascriptin Extra Strength Dolobid Moblgesic Ticlid  Ascriptin with Codeine Doloprin or Doloprin with Codeine Momentum Tolectin  Asperbuf Duoprin Mono-gesic Trendar  Aspergum Duradyne Motrin or Motrin IB Triminicin  Aspirin  plain, buffered or enteric coated Durasal Myochrisine Trigesic  Aspirin Suppositories Easprin Nalfon Trillsate  Aspirin with Codeine Ecotrin Regular or Extra Strength Naprosyn Uracel  Atromid-S Efficin Naproxen Ursinus  Auranofin Capsules Elmiron Neocylate Vanquish  Axotal Emagrin Norgesic Verin  Azathioprine Empirin or Empirin with Codeine Normiflo Vitamin E  Azolid Emprazil Nuprin Voltaren  Bayer Aspirin plain, buffered or children's or timed BC Tablets or powders Encaprin Orgaran Warfarin Sodium  Buff-a-Comp Enoxaparin Orudis Zorpin  Buff-a-Comp with Codeine Equegesic Os-Cal-Gesic   Buffaprin Excedrin plain, buffered or Extra Strength Oxalid   Bufferin Arthritis Strength Feldene Oxphenbutazone   Bufferin plain or Extra Strength Feldene Capsules Oxycodone with Aspirin   Bufferin with Codeine Fenoprofen Fenoprofen Pabalate or Pabalate-SF   Buffets II Flogesic Panagesic   Buffinol plain or Extra Strength Florinal or Florinal with Codeine Panwarfarin   Buf-Tabs Flurbiprofen Penicillamine   Butalbital Compound Four-way cold tablets Penicillin   Butazolidin Fragmin Pepto-Bismol   Carbenicillin Geminisyn Percodan   Carna Arthritis Reliever Geopen Persantine   Carprofen Gold's salt Persistin   Chloramphenicol Goody's Phenylbutazone   Chloromycetin Haltrain Piroxlcam   Clmetidine heparin Plaquenil   Cllnoril Hyco-pap Ponstel   Clofibrate Hydroxy chloroquine Propoxyphen         Before stopping any of these medications, be sure to consult the physician who ordered them.  Some, such as Coumadin (Warfarin) are ordered to prevent or treat serious conditions such as "deep thrombosis", "pumonary embolisms", and other heart problems.  The amount of time that you may need off of the medication may also vary with the medication and the reason for which you were taking it.  If you are taking any of these medications, please make sure you notify your pain physician before you undergo any  procedures.   PATIENT EDUCATION-Prior to glucocorticoid injection, the patient should be educated about appropriate postinjection care and possible complications. A survey of about one-quarter of the members of the [employer] ([employer]) found that 61 percent of rheumatologists ask patients to decrease weightbearing, often for 48 hours, after injection of a weightbearing joint, that 30 percent advise applying ice to the injected joint, and that 29 percent specifically caution about either infection or postinjection flare. Although it is rare (1 in 3500 procedures in one study [19]), infection is the complication that most justifies routine patient education. There are two other major components of patient education: ?Description of the more common local postinjection flare (up to 20 percent in muscular trigger or tender point injections), which is easily confused with  infection. Infection should be suspected if the flare lasts longer than or begins later than 48 hours after injection. Other findings suggestive of an iatrogenic septic joint include a crescendo pattern of pain, redness or drainage around the injection site, and fever or malaise. ?The flushing reaction seen in up to 10 percent of patients receiving triamcinolone acetonide (Kenalog). Leakage of joint fluid, like infection, occurs rarely and does not necessarily lead to infection. Nevertheless, this complication should prompt a repeat visit to the clinician. Leakage is particularly likely to occur in two settings: drainage of a popliteal (Baker's) cyst, and aspiration of an interphalangeal joint with a needle that is too large (see 'Needle size' below). Likewise, if a larger bore needle is used to evacuate viscous material in a cyst over a Heberden's node, the patient might be cautioned about leaking from the site.   Go to lab to get labwork drawn today.  Pre procedure instructions given.  DO NOT TAKE COUMADIN FOR 5 DAYS PRIOR  TO PROCEDURE.  BRING A DRIVER

## 2015-09-30 NOTE — Progress Notes (Signed)
Safety precautions to be maintained throughout the outpatient stay will include: orient to surroundings, keep bed in low position, maintain call bell within reach at all times, provide assistance with transfer out of bed and ambulation.   Oxycodone 5 mg #88 out of 150 remaining. Filled 09-02-15.

## 2015-09-30 NOTE — Progress Notes (Signed)
Patient's Name: [name]  Patient type: Established  MRN: [id number]  Service setting: Ambulatory outpatient  DOB: 1940/11/21  Location: [facility]  DOS: 09/30/2015  Primary Care Physician: Malachi Carl, MD  Note by: Kathlen Brunswick. Dossie Arbour, M.D  Referring Physician: Malachi Carl, MD  Specialty: Interventional Pain Management  Last Visit to Pain Management: 09/03/2015   Primary Reason(s) for Visit: Encounter for prescription drug management (Level of risk: moderate) CC: Hip Pain (left)   HPI  [name] is a 75 y.o. year old, female patient, who returns today as an established patient. She has HYPOTHYROIDISM; HYPERLIPIDEMIA; OBESITY; Essential hypertension; SICK SINUS SYNDROME; Cardiac pacemaker in situ; Cough; GERD (gastroesophageal reflux disease); Atrial fibrillation ([place]); Chronic pain; Chronic hip pain (Location of Primary Source of Pain) (Left); Long term current use of opiate analgesic; Long term prescription opiate use; Opiate use (54 MME/Day); Encounter for therapeutic drug level monitoring; Disturbance of skin sensation; Osteoarthritis of hip (Left); Trochanteric bursitis of hip (Left); Lumbar central spinal stenosis (L4-5); Lumbar facet arthropathy; Osteoarthritis of sacroiliac joint (with vacuum phenomena in) (Bilateral); Avascular necrosis of left femoral head (HCC); and Opioid-induced constipation (OIC) on her problem list.. Her primarily concern today is the Hip Pain (left)   Pain Assessment: Self-Reported Pain Score: 2              Reported level is compatible with observation       Pain Type: Chronic pain Pain Descriptors / Indicators: Constant Pain Frequency: Constant  The patient comes into the clinics today for pharmacological management of her chronic pain. I last saw this patient on 09/02/2015. The patient  reports that she does not use drugs. Her body mass index is 41.95 kg/m. The patient is pending orthopedic consult with Dr. Marry Guan for possible hip  replacement. Meanwhile, we'll bring her in to do a palliative hip joint injection. We will stop the Coumadin for 5 days prior to the procedure.  Date of Last Visit: 09/02/15 Service Provided on Last Visit: Med Refill  Controlled Substance Pharmacotherapy Assessment & REMS (Risk Evaluation and Mitigation Strategy)  Analgesic: Oxycodone IR 5 mg 1 tablet by mouth 5 times a day (25 mg/day) MME/day: 37.5 mg/day.  Pill Count: Oxycodone 5 mg #88 out of 150 remaining. Filled 09-02-15. Pharmacokinetics: Onset of action (Liberation/Absorption): Within expected pharmacological parameters Time to Peak effect (Distribution): Timing and results are as within normal expected parameters Duration of action (Metabolism/Excretion): Within normal limits for medication Pharmacodynamics: Analgesic Effect: More than 50% Activity Facilitation: Medication(s) allow patient to sit, stand, walk, and do the basic ADLs Perceived Effectiveness: Described as relatively effective, allowing for increase in activities of daily living (ADL) Side-effects or Adverse reactions: None reported Monitoring: [place] PMP: Online review of the past 65-month period conducted. Compliant with practice rules and regulations Initial UDS Testing:  Summary  Date Value Ref Range Status  08/11/2015 FINAL  Final    Comment:    ==================================================================== TOXASSURE COMP DRUG ANALYSIS,UR ==================================================================== Test                             Result       Flag       Units Drug Present and Declared for Prescription Verification   Codeine                        1060         EXPECTED   ng/mg creat  Morphine                       464          EXPECTED   ng/mg creat   Norcodeine                     63           EXPECTED   ng/mg creat    Sources of codeine include scheduled prescription medications;    morphine is an expected metabolite of codeine. Other sources  of    morphine include scheduled prescription medications or as a    metabolite of heroin.    Norcodeine is an expected metabolite of codeine.   Cyclobenzaprine                PRESENT      EXPECTED   Acetaminophen                  PRESENT      EXPECTED   Metoprolol                     PRESENT      EXPECTED Drug Present not Declared for Prescription Verification   Desmethyldiazepam              57           UNEXPECTED ng/mg creat   Oxazepam                       174          UNEXPECTED ng/mg creat   Temazepam                      104          UNEXPECTED ng/mg creat    Desmethyldiazepam, oxazepam, and temazepam are benzodiazepine    drugs, but may also be present as common metabolites of other    benzodiazepine drugs, including diazepam. ==================================================================== Test                      Result    Flag   Units      Ref Range   Creatinine              109              mg/dL      >=20 ==================================================================== Declared Medications:  The flagging and interpretation on this report are based on the  following declared medications.  Unexpected results may arise from  inaccuracies in the declared medications.  **Note: The testing scope of this panel includes these medications:  Codeine (Acetaminophen-Codeine)  Cyclobenzaprine  Metoprolol  **Note: The testing scope of this panel does not include small to  moderate amounts of these reported medications:  Acetaminophen (Acetaminophen-Codeine)  **Note: The testing scope of this panel does not include following  reported medications:  Colchicine  Hydrochlorothiazide (Triamterine-Hydrochlorthzide)  Levothyroxine  Liothyronine  Multivitamin  Potassium  Triamterene (Triamterine-Hydrochlorthzide)  Warfarin ==================================================================== For clinical consultation, please call [phone]. ====================================================================    List of UDS test(s) done:  Lab Results  Component Value Date   SUMMARY FINAL 08/11/2015   UDS interpretation: Compliant Patient recently changed from Tylenol with Codeine to oxycodone IR. Medication Assessment Form: Reviewed. Patient indicates being compliant with therapy Treatment compliance: Compliant Risk Assessment: Aberrant Behavior: None observed today Substance Use Disorder (  SUD) Risk Level: Low-to-moderate Risk of opioid abuse or dependence: 0.7-3.0% with doses ? 36 MME/day and 6.1-26% with doses ? 120 MME/day. Opioid Risk Tool (ORT) Score:  0 Low Risk for SUD (Score <3) Depression Scale Score: PHQ-2: PHQ-2 Total Score: 0 No depression (0) PHQ-9: PHQ-9 Total Score: 0 No depression (0-4)  Pharmacologic Plan: No change in therapy, at this time  Laboratory Chemistry  Inflammation Markers No results found for: ESRSEDRATE, CRP  Renal Function Lab Results  Component Value Date   BUN 37 (H) 07/23/2015   CREATININE 1.14 (H) 07/23/2015   GFRAA 53 (L) 07/23/2015   GFRNONAA 46 (L) 07/23/2015    Hepatic Function Lab Results  Component Value Date   AST 24 05/22/2009   ALT 20 05/22/2009   ALBUMIN 3.0 (L) 05/22/2009    Electrolytes Lab Results  Component Value Date   NA 136 07/23/2015   K 4.1 07/23/2015   CL 99 (L) 07/23/2015   CALCIUM 10.2 07/23/2015    Pain Modulating Vitamins No results found for: VD25OH, [id number], H157544, [id number], 25OHVITD1, 25OHVITD2, 25OHVITD3, VITAMINB12  Coagulation Parameters Lab Results  Component Value Date   INR 1.87 (H) 07/23/2015   LABPROT 21.5 (H) 07/23/2015   APTT 34 07/23/2015   PLT 181 07/23/2015    Cardiovascular Lab Results  Component Value Date   HGB 14.8 07/23/2015   HCT 43.0 07/23/2015    Note: Lab results reviewed.  Recent Diagnostic Imaging  Ct Hip Left Wo Contrast  Result Date: 08/18/2015 CLINICAL DATA:  Chronic left  hip pain. EXAM: CT OF THE LEFT HIP WITHOUT CONTRAST TECHNIQUE: Multidetector CT imaging of the left hip was performed according to the standard protocol. Multiplanar CT image reconstructions were also generated. COMPARISON:  None. FINDINGS: Bones/Joint/Cartilage Increased sclerosis in the superior left femoral head consistent with avascular necrosis with minimal articular surface collapse (image 28/series 602). No acute fracture or dislocation. Mild osteoarthritis of the left hip. Mild degenerative changes of the pubic symphysis. Normal alignment. No joint effusion. Muscles Normal. Soft tissue No fluid collection or hematoma.  No soft tissue mass. IMPRESSION: 1. Avascular necrosis of the left femoral head with mild articular surface collapse. Mild osteoarthritis of the left hip. Electronically Signed   By: Kathreen Devoid   On: 08/18/2015 13:12    Meds  The patient has a current medication list which includes the following prescription(s): colchicine, hydroxocobalamin, levothyroxine, levothyroxine, liothyronine, magnesium sulfate, metoprolol, multivitamin with minerals, oxycodone, potassium chloride, warfarin, lubiprostone, and benefiber.  Current Outpatient Prescriptions on File Prior to Visit  Medication Sig  . colchicine 0.6 MG tablet Take 0.6 mg by mouth daily as needed (GOUT).   Marland Kitchen levothyroxine (SYNTHROID, LEVOTHROID) 50 MCG tablet Take 50 mcg by mouth every morning.  . metoprolol (LOPRESSOR) 50 MG tablet Take 1.5 tablets (75 mg total) by mouth 2 (two) times daily.  . Multiple Vitamin (MULTIVITAMIN WITH MINERALS) TABS tablet Take 1 tablet by mouth daily.  . potassium chloride (MICRO-K) 10 MEQ CR capsule Take 10 mEq by mouth 2 (two) times daily.  Marland Kitchen warfarin (COUMADIN) 5 MG tablet Take 5 mg by mouth daily. Sun-0.5 half tab (2.5 mg total); All other days-1 tab (5 mg total)    No current facility-administered medications on file prior to visit.     ROS  Constitutional: Denies any fever or  chills Gastrointestinal: No reported hemesis, hematochezia, vomiting, or acute GI distress Musculoskeletal: Denies any acute onset joint swelling, redness, loss of ROM, or weakness Neurological: No reported episodes of  acute onset apraxia, aphasia, dysarthria, agnosia, amnesia, paralysis, loss of coordination, or loss of consciousness  Allergies  Ms. Carneiro is allergic to contrast media [iodinated diagnostic agents].  [place]  Medical:  Ms. Mcdaniels  has a past medical history of Anxiety; Aortic insufficiency; Arthritis; Asthma; Bronchitis; Bursitis, hip; Claustrophobia; GERD (gastroesophageal reflux disease); Hyperlipidemia; Hypertension; Hypothyroidism; Mitral regurgitation; Obesity; Paroxysmal atrial fibrillation (HCC); PTSD (post-traumatic stress disorder); Rhinitis; Sick sinus syndrome ([place]); and Sleep apnea. Family: family history includes Alzheimer's disease in her father; Arthritis in her sister; Cancer in her brother and maternal grandfather; Heart disease in her mother; Hypertension in her sister; Stroke in her mother. Surgical:  has a past surgical history that includes Tonsillectomy; Pacemaker insertion; Status post child birth x1; Rotator cuff repair; and Dilatation & curettage/hysteroscopy with myosure (N/A, 08/03/2015). Tobacco:  reports that she has never smoked. She has never used smokeless tobacco. Alcohol:  reports that she does not drink alcohol. Drug:  reports that she does not use drugs.  Constitutional Exam  Vitals: Blood pressure (!) 139/54, pulse 73, temperature 97.8 F (36.6 C), temperature source Oral, resp. rate 16, height 5\' 1"  (1.549 m), weight 222 lb (100.7 kg), SpO2 100 %. General appearance: Well nourished, well developed, and well hydrated. In no acute distress Calculated BMI/Body habitus: Body mass index is 41.95 kg/m. (>40 kg/m2) Extreme obesity (Class III) - 254% higher incidence of chronic pain Psych/Mental status: Alert and oriented x 3 (person, place, &  time) Eyes: PERLA Respiratory: No evidence of acute respiratory distress  Cervical Spine Exam  Inspection: No masses, redness, or swelling Alignment: Symmetrical Functional ROM: ROM appears unrestricted Stability: No instability detected Muscle strength & Tone: Functionally intact Sensory: Unimpaired Palpation: Non-contributory  Upper Extremity (UE) Exam    Side: Right upper extremity  Side: Left upper extremity  Inspection: No masses, redness, swelling, or asymmetry  Inspection: No masses, redness, swelling, or asymmetry  Functional ROM: ROM appears unrestricted  Functional ROM: ROM appears unrestricted  Muscle strength & Tone: Functionally intact  Muscle strength & Tone: Functionally intact  Sensory: Unimpaired  Sensory: Unimpaired  Palpation: Non-contributory  Palpation: Non-contributory   Thoracic Spine Exam  Inspection: No masses, redness, or swelling Alignment: Symmetrical Functional ROM: ROM appears unrestricted Stability: No instability detected Sensory: Unimpaired Muscle strength & Tone: Functionally intact Palpation: Non-contributory  Lumbar Spine Exam  Inspection: No masses, redness, or swelling Alignment: Symmetrical Functional ROM: ROM appears unrestricted Stability: No instability detected Muscle strength & Tone: Functionally intact Sensory: Unimpaired Palpation: Non-contributory Provocative Tests: Lumbar Hyperextension and rotation test: evaluation deferred today       Patrick's Maneuver: evaluation deferred today              Gait & Posture Assessment  Ambulation: Patient ambulates using a walker Gait: Very limited, using assistive device to ambulate Posture: WNL   Lower Extremity Exam    Side: Right lower extremity  Side: Left lower extremity  Inspection: No masses, redness, swelling, or asymmetry  Inspection: No masses, redness, swelling, or asymmetry  Functional ROM: ROM appears unrestricted  Functional ROM: Pain-restricted ROM  Muscle strength &  Tone: Functionally intact  Muscle strength & Tone: Deconditioned  Sensory: Unimpaired  Sensory: Movement-associated pain  Palpation: Non-contributory  Palpation: Complains of area being tender to palpation   Assessment & Plan  Primary Diagnosis & Pertinent Problem List: The primary encounter diagnosis was Chronic pain. Diagnoses of Avascular necrosis of left femoral head ([place]), Long term current use of opiate analgesic, Opiate use (54 MME/Day),  Disturbance of skin sensation, Chronic hip pain (Location of Primary Source of Pain) (Left), and Opioid-induced constipation (OIC) were also pertinent to this visit.  Visit Diagnosis: 1. Chronic pain   2. Avascular necrosis of left femoral head (HCC)   3. Long term current use of opiate analgesic   4. Opiate use (54 MME/Day)   5. Disturbance of skin sensation   6. Chronic hip pain (Location of Primary Source of Pain) (Left)   7. Opioid-induced constipation (OIC)     Problems updated and reviewed during this visit: Problem  Opioid-induced constipation (OIC)    Problem-specific Plan(s): No problem-specific Assessment & Plan notes found for this encounter.  No new Assessment & Plan notes have been filed under this hospital service since the last note was generated. Service: Pain Management   Plan of Care   Problem List Items Addressed This Visit      High   Avascular necrosis of left femoral head (HCC) (Chronic)   Relevant Orders   25-Hydroxyvitamin D Lcms D2+D3   HIP INJECTION   Chronic hip pain (Location of Primary Source of Pain) (Left) (Chronic)   Relevant Orders   HIP INJECTION   Chronic pain - Primary (Chronic)   Relevant Medications   oxyCODONE (OXY IR/ROXICODONE) 5 MG immediate release tablet   Other Relevant Orders   25-Hydroxyvitamin D Lcms D2+D3     Medium   Long term current use of opiate analgesic (Chronic)   Relevant Orders   ToxASSURE Select 13 (MW), Urine   Opiate use (54 MME/Day) (Chronic)   Opioid-induced  constipation (OIC) (Chronic)   Relevant Medications   lubiprostone (AMITIZA) 8 MCG capsule   Wheat Dextrin (BENEFIBER) POWD     Low   Disturbance of skin sensation (Chronic)   Relevant Orders   Vitamin B12    Other Visit Diagnoses   None.      Pharmacotherapy (Medications Ordered): Meds ordered this encounter  Medications  . oxyCODONE (OXY IR/ROXICODONE) 5 MG immediate release tablet    Sig: Take 1 tablet (5 mg total) by mouth 5 (five) times daily as needed for severe pain.    Dispense:  150 tablet    Refill:  0    Do not place this medication, or any other prescription from our practice, on "Automatic Refill". Patient may have prescription filled one day early if pharmacy is closed on scheduled refill date. Do not fill until: 10/02/15 To last until: 11/01/15  . lubiprostone (AMITIZA) 8 MCG capsule    Sig: Take 1 capsule (8 mcg total) by mouth 2 (two) times daily with a meal. Swallow the medication whole. Do not break or chew the medication.    Dispense:  60 capsule    Refill:  0    Do not place this medication, or any other prescription from our practice, on "Automatic Refill". Patient may have prescription filled one day early if pharmacy is closed on scheduled refill date.  . Wheat Dextrin (BENEFIBER) POWD    Sig: Stir 2 tsp. TID into 4-8 oz of any non-carbonated beverage or soft food (hot or cold)    Dispense:  500 g    Refill:  PRN    This is an OTC product. This prescription is to serve as a reminder to the patient as to our preference.    Lab-work & Procedure Ordered: Orders Placed This Encounter  Procedures  . HIP INJECTION  . 25-Hydroxyvitamin D Lcms D2+D3  . Vitamin B12  . ToxASSURE Select 13 (MW),  Urine    Imaging Ordered: None  Interventional Therapies: (Always stop coumadin x 5 days prior to procedures.) Scheduled:  1. Diagnostic left intra-articular hip joint injection under fluoroscopic guidance, with a without sedation, after I return from  vacation. Stop coumadin x 5 days prior to procedure. 2. Possible left trochanteric bursa injection under fluoroscopic guidance, with a without sedation.    Considering:  1. Diagnostic left intra-articular hip joint injection under fluoroscopic guidance, with a without sedation.  2. Possible left hip radiofrequency ablation under fluoroscopic guidance and IV sedation depending on the results of the diagnostic injection.  3. Diagnostic left-sided L4-5 lumbar epidural steroid injection under fluoroscopic guidance, with a without sedation.  4. Diagnostic left transforaminal epidural steroid injection under fluoroscopic guidance, with or without sedation.  5. Diagnostic left sided sacroiliac joint block under fluoroscopic guidance, with a without sedation.  6. Possible left sided sacroiliac joint radiofrequency ablation under fluoroscopic guidance and IV sedation depending on the results of the diagnostic injection.    PRN Procedures:  1. Palliative left intra-articular hip joint injection under fluoroscopic guidance, with a without sedation.  2. Palliative left trochanteric bursa injection under fluoroscopic guidance, with or without sedation.    Referral(s) or Consult(s): None at this time.  New Prescriptions   LUBIPROSTONE (AMITIZA) 8 MCG CAPSULE    Take 1 capsule (8 mcg total) by mouth 2 (two) times daily with a meal. Swallow the medication whole. Do not break or chew the medication.   WHEAT DEXTRIN (BENEFIBER) POWD    Stir 2 tsp. TID into 4-8 oz of any non-carbonated beverage or soft food (hot or cold)    Medications administered during this visit: Ms. Kirchmann had no medications administered during this visit.  Requested PM Follow-up: Return in 4 weeks (on 10/25/2015) for (1-Mo) Med-Mgmt, In addition, Schedule Procedure.  Future Appointments Date Time Provider [place]  10/14/2015 1:45 PM Milinda Pointer, MD [facility] None  10/27/2015 8:00 AM Milinda Pointer, MD  [facility] None    Primary Care Physician: Malachi Carl, MD Location: [facility] Facility Note by: Kathlen Brunswick. Dossie Arbour, M.D, DABA, DABAPM, DABPM, DABIPP, FIPP  Pain Score Disclaimer: We use the NRS-11 scale. This is a self-reported, subjective measurement of pain severity with only modest accuracy. It is used primarily to identify changes within a particular patient. It must be understood that outpatient pain scales are significantly less accurate that those used for research, where they can be applied under ideal controlled circumstances with minimal exposure to variables. In reality, the score is likely to be a combination of pain intensity and pain affect, where pain affect describes the degree of emotional arousal or changes in action readiness caused by the sensory experience of pain. Factors such as social and work situation, setting, emotional state, anxiety levels, expectation, and prior pain experience may influence pain perception and show large inter-individual differences that may also be affected by time variables.  Patient instructions provided during this appointment: Patient Instructions   GENERAL RISKS AND COMPLICATIONS  What are the risk, side effects and possible complications? Generally speaking, most procedures are safe.  However, with any procedure there are risks, side effects, and the possibility of complications.  The risks and complications are dependent upon the sites that are lesioned, or the type of nerve block to be performed.  The closer the procedure is to the spine, the more serious the risks are.  Great care is taken when placing the radio frequency needles, block needles or lesioning  probes, but sometimes complications can occur. 1. Infection: Any time there is an injection through the skin, there is a risk of infection.  This is why sterile conditions are used for these blocks.  There are four possible types of infection. 1. Localized skin  infection. 2. Central Nervous System Infection-This can be in the form of Meningitis, which can be deadly. 3. Epidural Infections-This can be in the form of an epidural abscess, which can cause pressure inside of the spine, causing compression of the spinal cord with subsequent paralysis. This would require an emergency surgery to decompress, and there are no guarantees that the patient would recover from the paralysis. 4. Discitis-This is an infection of the intervertebral discs.  It occurs in about 1% of discography procedures.  It is difficult to treat and it may lead to surgery.        2. Pain: the needles have to go through skin and soft tissues, will cause soreness.       3. Damage to internal structures:  The nerves to be lesioned may be near blood vessels or    other nerves which can be potentially damaged.       4. Bleeding: Bleeding is more common if the patient is taking blood thinners such as  aspirin, Coumadin, Ticiid, Plavix, etc., or if he/she have some genetic predisposition  such as hemophilia. Bleeding into the spinal canal can cause compression of the spinal  cord with subsequent paralysis.  This would require an emergency surgery to  decompress and there are no guarantees that the patient would recover from the  paralysis.       5. Pneumothorax:  Puncturing of a lung is a possibility, every time a needle is introduced in  the area of the chest or upper back.  Pneumothorax refers to free air around the  collapsed lung(s), inside of the thoracic cavity (chest cavity).  Another two possible  complications related to a similar event would include: Hemothorax and Chylothorax.   These are variations of the Pneumothorax, where instead of air around the collapsed  lung(s), you may have blood or chyle, respectively.       6. Spinal headaches: They may occur with any procedures in the area of the spine.       7. Persistent CSF (Cerebro-Spinal Fluid) leakage: This is a rare problem, but may occur   with prolonged intrathecal or epidural catheters either due to the formation of a fistulous  track or a dural tear.       8. Nerve damage: By working so close to the spinal cord, there is always a possibility of  nerve damage, which could be as serious as a permanent spinal cord injury with  paralysis.       9. Death:  Although rare, severe deadly allergic reactions known as "Anaphylactic  reaction" can occur to any of the medications used.      10. Worsening of the symptoms:  We can always make thing worse.  What are the chances of something like this happening? Chances of any of this occuring are extremely low.  By statistics, you have more of a chance of getting killed in a motor vehicle accident: while driving to the hospital than any of the above occurring .  Nevertheless, you should be aware that they are possibilities.  In general, it is similar to taking a shower.  Everybody knows that you can slip, hit your head and get killed.  Does that mean that  you should not shower again?  Nevertheless always keep in mind that statistics do not mean anything if you happen to be on the wrong side of them.  Even if a procedure has a 1 (one) in a 1,000,000 (million) chance of going wrong, it you happen to be that one..Also, keep in mind that by statistics, you have more of a chance of having something go wrong when taking medications.  Who should not have this procedure? If you are on a blood thinning medication (e.g. Coumadin, Plavix, see list of "Blood Thinners"), or if you have an active infection going on, you should not have the procedure.  If you are taking any blood thinners, please inform your physician.  How should I prepare for this procedure?  Do not eat or drink anything at least six hours prior to the procedure.  Bring a driver with you .  It cannot be a taxi.  Come accompanied by an adult that can drive you back, and that is strong enough to help you if your legs get weak or numb from the  local anesthetic.  Take all of your medicines the morning of the procedure with just enough water to swallow them.  If you have diabetes, make sure that you are scheduled to have your procedure done first thing in the morning, whenever possible.  If you have diabetes, take only half of your insulin dose and notify our nurse that you have done so as soon as you arrive at the clinic.  If you are diabetic, but only take blood sugar pills (oral hypoglycemic), then do not take them on the morning of your procedure.  You may take them after you have had the procedure.  Do not take aspirin or any aspirin-containing medications, at least eleven (11) days prior to the procedure.  They may prolong bleeding.  Wear loose fitting clothing that may be easy to take off and that you would not mind if it got stained with Betadine or blood.  Do not wear any jewelry or perfume  Remove any nail coloring.  It will interfere with some of our monitoring equipment.  NOTE: Remember that this is not meant to be interpreted as a complete list of all possible complications.  Unforeseen problems may occur.  BLOOD THINNERS The following drugs contain aspirin or other products, which can cause increased bleeding during surgery and should not be taken for 2 weeks prior to and 1 week after surgery.  If you should need take something for relief of minor pain, you may take acetaminophen which is found in Tylenol,m Datril, Anacin-3 and Panadol. It is not blood thinner. The products listed below are.  Do not take any of the products listed below in addition to any listed on your instruction sheet.  A.P.C or A.P.C with Codeine Codeine Phosphate Capsules #3 Ibuprofen Ridaura  ABC compound Congesprin Imuran rimadil  Advil Cope Indocin Robaxisal  Alka-Seltzer Effervescent Pain Reliever and Antacid Coricidin or Coricidin-D  Indomethacin Rufen  Alka-Seltzer plus Cold Medicine Cosprin Ketoprofen S-A-C Tablets  Anacin Analgesic  Tablets or Capsules Coumadin Korlgesic Salflex  Anacin Extra Strength Analgesic tablets or capsules CP-2 Tablets Lanoril Salicylate  Anaprox Cuprimine Capsules Levenox Salocol  Anexsia-D Dalteparin Magan Salsalate  Anodynos Darvon compound Magnesium Salicylate Sine-off  Ansaid Dasin Capsules Magsal Sodium Salicylate  Anturane Depen Capsules Marnal Soma  APF Arthritis pain formula Dewitt's Pills Measurin Stanback  Argesic Dia-Gesic Meclofenamic Sulfinpyrazone  Arthritis Bayer Timed Release Aspirin Diclofenac Meclomen Sulindac  Arthritis  pain formula Anacin Dicumarol Medipren Supac  Analgesic (Safety coated) Arthralgen Diffunasal Mefanamic Suprofen  Arthritis Strength Bufferin Dihydrocodeine Mepro Compound Suprol  Arthropan liquid Dopirydamole Methcarbomol with Aspirin Synalgos  ASA tablets/Enseals Disalcid Micrainin Tagament  Ascriptin Doan's Midol Talwin  Ascriptin A/D Dolene Mobidin Tanderil  Ascriptin Extra Strength Dolobid Moblgesic Ticlid  Ascriptin with Codeine Doloprin or Doloprin with Codeine Momentum Tolectin  Asperbuf Duoprin Mono-gesic Trendar  Aspergum Duradyne Motrin or Motrin IB Triminicin  Aspirin plain, buffered or enteric coated Durasal Myochrisine Trigesic  Aspirin Suppositories Easprin Nalfon Trillsate  Aspirin with Codeine Ecotrin Regular or Extra Strength Naprosyn Uracel  Atromid-S Efficin Naproxen Ursinus  Auranofin Capsules Elmiron Neocylate Vanquish  Axotal Emagrin Norgesic Verin  Azathioprine Empirin or Empirin with Codeine Normiflo Vitamin E  Azolid Emprazil Nuprin Voltaren  Bayer Aspirin plain, buffered or children's or timed BC Tablets or powders Encaprin Orgaran Warfarin Sodium  Buff-a-Comp Enoxaparin Orudis Zorpin  Buff-a-Comp with Codeine Equegesic Os-Cal-Gesic   Buffaprin Excedrin plain, buffered or Extra Strength Oxalid   Bufferin Arthritis Strength Feldene Oxphenbutazone   Bufferin plain or Extra Strength Feldene Capsules Oxycodone with Aspirin    Bufferin with Codeine Fenoprofen Fenoprofen Pabalate or Pabalate-SF   Buffets II Flogesic Panagesic   Buffinol plain or Extra Strength Florinal or Florinal with Codeine Panwarfarin   Buf-Tabs Flurbiprofen Penicillamine   Butalbital Compound Four-way cold tablets Penicillin   Butazolidin Fragmin Pepto-Bismol   Carbenicillin Geminisyn Percodan   Carna Arthritis Reliever Geopen Persantine   Carprofen Gold's salt Persistin   Chloramphenicol Goody's Phenylbutazone   Chloromycetin Haltrain Piroxlcam   Clmetidine heparin Plaquenil   Cllnoril Hyco-pap Ponstel   Clofibrate Hydroxy chloroquine Propoxyphen         Before stopping any of these medications, be sure to consult the physician who ordered them.  Some, such as Coumadin (Warfarin) are ordered to prevent or treat serious conditions such as "deep thrombosis", "pumonary embolisms", and other heart problems.  The amount of time that you may need off of the medication may also vary with the medication and the reason for which you were taking it.  If you are taking any of these medications, please make sure you notify your pain physician before you undergo any procedures.   PATIENT EDUCATION-Prior to glucocorticoid injection, the patient should be educated about appropriate postinjection care and possible complications. A survey of about one-quarter of the members of the [employer] of [employer] (ACR) found that 22 percent of rheumatologists ask patients to decrease weightbearing, often for 48 hours, after injection of a weightbearing joint, that 30 percent advise applying ice to the injected joint, and that 29 percent specifically caution about either infection or postinjection flare. Although it is rare (1 in 3500 procedures in one study [19]), infection is the complication that most justifies routine patient education. There are two other major components of patient education: ?Description of the more common local postinjection flare (up  to 20 percent in muscular trigger or tender point injections), which is easily confused with infection. Infection should be suspected if the flare lasts longer than or begins later than 48 hours after injection. Other findings suggestive of an iatrogenic septic joint include a crescendo pattern of pain, redness or drainage around the injection site, and fever or malaise. ?The flushing reaction seen in up to 10 percent of patients receiving triamcinolone acetonide (Kenalog). Leakage of joint fluid, like infection, occurs rarely and does not necessarily lead to infection. Nevertheless, this complication should prompt a repeat visit to the clinician.  Leakage is particularly likely to occur in two settings: drainage of a popliteal (Baker's) cyst, and aspiration of an interphalangeal joint with a needle that is too large (see 'Needle size' below). Likewise, if a larger bore needle is used to evacuate viscous material in a cyst over a Heberden's node, the patient might be cautioned about leaking from the site.   Go to lab to get labwork drawn today.  Pre procedure instructions given.  DO NOT TAKE COUMADIN FOR 5 DAYS PRIOR TO PROCEDURE.  BRING A DRIVER

## 2015-10-03 LAB — 25-HYDROXYVITAMIN D LCMS D2+D3: 25-HYDROXY, VITAMIN D-3: 35 ng/mL

## 2015-10-03 LAB — 25-HYDROXY VITAMIN D LCMS D2+D3
25-Hydroxy, Vitamin D-2: 7.3 ng/mL
25-Hydroxy, Vitamin D: 42 ng/mL

## 2015-10-04 NOTE — Progress Notes (Signed)
Normal Vitamin B-12 levels are between 211 and 946 pg/mL, for our Lab. Medical conditions that can increase levels of vitamin B12 include liver disease, kidney failure and myeloproliferative disorders, which includes myelocytic leukemia and polycythemia vera.

## 2015-10-08 LAB — TOXASSURE SELECT 13 (MW), URINE: PDF: 0

## 2015-10-14 ENCOUNTER — Ambulatory Visit: Payer: Medicare Other | Attending: Pain Medicine | Admitting: Pain Medicine

## 2015-10-14 ENCOUNTER — Encounter: Payer: Self-pay | Admitting: Pain Medicine

## 2015-10-14 VITALS — BP 167/95 | HR 84 | Temp 97.7°F | Resp 13 | Ht 61.0 in | Wt 220.0 lb

## 2015-10-14 DIAGNOSIS — M7062 Trochanteric bursitis, left hip: Secondary | ICD-10-CM | POA: Insufficient documentation

## 2015-10-14 DIAGNOSIS — Z950 Presence of cardiac pacemaker: Secondary | ICD-10-CM | POA: Insufficient documentation

## 2015-10-14 DIAGNOSIS — I4891 Unspecified atrial fibrillation: Secondary | ICD-10-CM | POA: Insufficient documentation

## 2015-10-14 DIAGNOSIS — I10 Essential (primary) hypertension: Secondary | ICD-10-CM | POA: Insufficient documentation

## 2015-10-14 DIAGNOSIS — G8929 Other chronic pain: Secondary | ICD-10-CM | POA: Insufficient documentation

## 2015-10-14 DIAGNOSIS — M1610 Unilateral primary osteoarthritis, unspecified hip: Secondary | ICD-10-CM | POA: Diagnosis not present

## 2015-10-14 DIAGNOSIS — E669 Obesity, unspecified: Secondary | ICD-10-CM | POA: Insufficient documentation

## 2015-10-14 DIAGNOSIS — Z79891 Long term (current) use of opiate analgesic: Secondary | ICD-10-CM | POA: Insufficient documentation

## 2015-10-14 DIAGNOSIS — M4806 Spinal stenosis, lumbar region: Secondary | ICD-10-CM | POA: Diagnosis not present

## 2015-10-14 DIAGNOSIS — M87052 Idiopathic aseptic necrosis of left femur: Secondary | ICD-10-CM | POA: Diagnosis not present

## 2015-10-14 DIAGNOSIS — I495 Sick sinus syndrome: Secondary | ICD-10-CM | POA: Diagnosis not present

## 2015-10-14 DIAGNOSIS — E785 Hyperlipidemia, unspecified: Secondary | ICD-10-CM | POA: Diagnosis not present

## 2015-10-14 DIAGNOSIS — R05 Cough: Secondary | ICD-10-CM | POA: Insufficient documentation

## 2015-10-14 DIAGNOSIS — K219 Gastro-esophageal reflux disease without esophagitis: Secondary | ICD-10-CM | POA: Insufficient documentation

## 2015-10-14 DIAGNOSIS — M533 Sacrococcygeal disorders, not elsewhere classified: Secondary | ICD-10-CM | POA: Insufficient documentation

## 2015-10-14 DIAGNOSIS — M1612 Unilateral primary osteoarthritis, left hip: Secondary | ICD-10-CM | POA: Insufficient documentation

## 2015-10-14 DIAGNOSIS — K5903 Drug induced constipation: Secondary | ICD-10-CM | POA: Insufficient documentation

## 2015-10-14 DIAGNOSIS — E039 Hypothyroidism, unspecified: Secondary | ICD-10-CM | POA: Insufficient documentation

## 2015-10-14 DIAGNOSIS — M879 Osteonecrosis, unspecified: Secondary | ICD-10-CM | POA: Diagnosis not present

## 2015-10-14 DIAGNOSIS — M25552 Pain in left hip: Secondary | ICD-10-CM

## 2015-10-14 MED ORDER — ROPIVACAINE HCL 2 MG/ML IJ SOLN: 9.0000 mL | Freq: Once | INTRAMUSCULAR | Status: DC

## 2015-10-14 MED ORDER — FENTANYL CITRATE (PF) 100 MCG/2ML IJ SOLN: 25.0000 ug | INTRAMUSCULAR | Status: DC | PRN

## 2015-10-14 MED ORDER — METHYLPREDNISOLONE ACETATE 80 MG/ML IJ SUSP
INTRAMUSCULAR | Status: AC
  Administered 2015-10-14: 15:00:00
  Filled 2015-10-14: qty 1

## 2015-10-14 MED ORDER — METHYLPREDNISOLONE ACETATE 80 MG/ML IJ SUSP: 80.0000 mg | Freq: Once | INTRAMUSCULAR | Status: DC

## 2015-10-14 MED ORDER — LACTATED RINGERS IV SOLN: 1000.0000 mL | Freq: Once | INTRAVENOUS | Status: DC

## 2015-10-14 MED ORDER — LIDOCAINE HCL (PF) 1 % IJ SOLN
10.0000 mL | Freq: Once | INTRAMUSCULAR | Status: AC
  Administered 2015-10-14: 10 mL

## 2015-10-14 MED ORDER — MIDAZOLAM HCL 5 MG/5ML IJ SOLN: 1.0000 mg | INTRAMUSCULAR | Status: DC | PRN

## 2015-10-14 MED ORDER — ROPIVACAINE HCL 2 MG/ML IJ SOLN
INTRAMUSCULAR | Status: AC
  Administered 2015-10-14: 15:00:00
  Filled 2015-10-14: qty 10

## 2015-10-14 MED ORDER — IOPAMIDOL (ISOVUE-M 200) INJECTION 41%: 10.0000 mL | Freq: Once | INTRAMUSCULAR | Status: DC

## 2015-10-14 NOTE — Progress Notes (Signed)
Patient here for procedure d/t left hip pain.  No sedation.   Patient has contrast allergy.  Safety precautions to be maintained throughout the outpatient stay will include: orient to surroundings, keep bed in low position, maintain call bell within reach at all times, provide assistance with transfer out of bed and ambulation.

## 2015-10-14 NOTE — Progress Notes (Signed)
Patient's Name: [name]  Patient type: Established  MRN: [id number]  Service setting: Ambulatory outpatient  DOB: 1940/05/27  Location: [facility]  DOS: 10/14/2015  Primary Care Physician: Malachi Carl, MD  Note by: Kathlen Brunswick. Dossie Arbour, M.D, DABA, DABAPM, DABPM, Milagros Evener, FIPP  Referring Physician: Milinda Pointer, MD  Specialty: Board-Certified Interventional Pain Management  Last Visit to Pain Management: 09/30/2015   Primary Reason(s) for Visit: Interventional Pain Management Treatment. CC: Hip Pain (left)  Chronic left hip pain [M25.552, G89.29]   Procedure:  Anesthesia, Analgesia, Anxiolysis:  Type: Therapeutic Intra-Articular Hip Injection Region:  Posterolateral hip joint area. Level: Lower pelvic and hip joint level. Laterality: Left-Sided  Indications: 1. Chronic hip pain (Location of Primary Source of Pain) (Left)   2. Avascular necrosis of left femoral head (HCC)     Pre-procedure Pain Score: 5/10 Reported level of pain is compatible with clinical observations Post-procedure Pain Score: 5   Type: Local Anesthesia Local Anesthetic: Lidocaine 1% Route: Infiltration ([place]/IM) IV Access: Declined Sedation: Declined  Indication(s): Analgesia     Pre-Procedure Assessment  [name] is a 75 y.o. year old, female patient, seen today for interventional treatment. 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 Her primarily concern today is the Hip Pain (left)   Pain Type: Chronic pain Pain Location: Hip Pain Orientation: Left Pain Descriptors / Indicators: Aching, Tingling, Constant Pain Frequency: Constant  Date of Last Visit: 09/30/15 Service Provided on Last Visit: Med Refill  Coagulation Parameters Lab Results  Component Value Date   INR 1.87 (H) 07/23/2015   LABPROT 21.5 (H) 07/23/2015   APTT 34 07/23/2015   PLT 181 07/23/2015    Verification of the correct person, correct site (including marking of site), and correct procedure were performed and confirmed by the patient.  Consent: Secured. Under the influence of no sedatives a written informed consent was obtained, after having provided information on the risks and possible complications. To fulfill our ethical and legal obligations, as recommended by the American Medical Association's Code of Ethics, we have provided information to the patient about our clinical impression; the nature and purpose of the treatment or procedure; the risks, benefits, and possible complications of the intervention; alternatives; the risk(s) and benefit(s) of the alternative treatment(s) or procedure(s); and the risk(s) and benefit(s) of doing nothing. The patient was provided information about the risks and possible complications associated with the procedure. These include, but are not limited to, failure to achieve desired goals, infection, bleeding, organ or nerve damage, allergic reactions, paralysis, and death. In the case of intra- or periarticular procedures these may include, but are not limited to, failure to achieve desired goals, infection, bleeding (hemarthrosis), organ or nerve damage, allergic reactions, and death. In addition, the patient was informed that Medicine is  not an exact science; therefore, there is also the possibility of unforeseen risks and possible complications that may result in a catastrophic outcome. The patient indicated having  understood very clearly. We have given the patient no guarantees and we have made no promises. Enough time was given to the patient to ask questions, all of which were answered to the patient's satisfaction.  Consent Attestation: I, the ordering provider, attest that I have discussed with the patient the benefits, risks, side-effects, alternatives, likelihood of achieving goals, and potential problems during recovery for the procedure that I have provided informed consent.  Pre-Procedure Preparation: Safety Precautions: Allergies reviewed. Appropriate site, procedure, and patient were confirmed by following the Joint Commission's Universal Protocol (UP.01.01.01), in the form of a "Time Out". The patient was asked to confirm marked site and procedure, before commencing. The patient was asked about blood thinners, or active infections, both of which were denied. Patient was assessed for positional comfort and all pressure points were checked before starting procedure. Allergies: She is allergic to contrast media [iodinated diagnostic agents].. Infection Control Precautions: Sterile technique used. Standard Universal Precautions were taken as recommended by the Department of [facility] for Disease Control and Prevention (CDC). Standard pre-surgical skin prep was conducted. Respiratory hygiene and cough etiquette was practiced. Hand hygiene observed. Safe injection practices and needle disposal techniques followed. SDV (single dose vial) medications used. Medications properly checked for expiration dates and contaminants. Personal protective equipment (PPE) used: Sterile Radiation-resistant gloves. Monitoring:  As per clinic protocol. Vitals:   10/14/15 1403 10/14/15 1457 10/14/15 1501 10/14/15 1507  BP: (!) 161/66 (!)  137/106 (!) 152/77 (!) 167/95  Pulse: 84 89 95 84  Resp: 16 18 13 13   Temp: 97.7 F (36.5 C)     TempSrc: Oral     SpO2: 100% 99% 99% 98%  Weight: 220 lb (99.8 kg)     Height: 5' 1"  (1.549 m)     Calculated BMI: Body mass index is 41.57 kg/m.  Description of Procedure Process:   Time-out: "Time-out" completed before starting procedure, as per protocol. Position: Right Lateral Decubitus. Target Area: Superior aspect of the hip joint cavity, going thru the superior portion of the capsular ligament. Approach: Lateral approach. Area Prepped: Entire Posterolateral hip area. Prepping solution: ChloraPrep (2% chlorhexidine gluconate and 70% isopropyl alcohol) Safety Precautions: Aspiration looking for blood return was conducted prior to all injections. At no point did we inject any substances, as a needle was being advanced. No attempts were made at seeking any paresthesias. Safe injection practices and needle disposal techniques used. Medications properly checked for expiration dates. SDV (single dose vial) medications used.   Description of the Procedure: Protocol guidelines were followed. The patient was placed in position over the fluoroscopy table. The target area was identified and the area prepped in the usual manner. Skin & deeper tissues infiltrated with local anesthetic. Appropriate amount of time allowed to pass for local anesthetics to take effect. The procedure needles were then advanced to the target area. Proper needle placement secured. Negative aspiration confirmed. Solution injected in intermittent fashion, asking for systemic symptoms every 0.5cc of injectate. The needles were then removed and the area cleansed, making sure to leave some of the prepping solution back to take advantage of its long term bactericidal properties. EBL: Minimal Materials & Medications Used:  Needle(s) Used: 22g - 7" Spinal Needle(s) Solution Injected: 0.2% PF-Ropivacaine (69m) + SDV-DepoMedrol 80 mg/ml  (131m Medications Administered today: We administered lidocaine (PF), ropivacaine (PF) 2 mg/ml (0.2%), and methylPREDNISolone acetate.Please see chart orders for dosing details.  Imaging Guidance:   Type of Imaging Technique: Fluoroscopy Guidance (Non-spinal) Indication(s): Assistance in needle  guidance and placement for procedures requiring needle placement in or near specific anatomical locations not easily accessible without such assistance. Exposure Time: Please see nurses notes. Contrast: Before injecting any contrast, we confirmed that the patient did not have an allergy to iodine, shellfish, or radiological contrast. Once satisfactory needle placement was completed at the desired level, radiological contrast was injected. Injection was conducted under continuous fluoroscopic guidance. Injection of contrast accomplished without complications. See chart for type and volume of contrast used. Fluoroscopic Guidance: I was personally present in the fluoroscopy suite, where the patient was placed in position for the procedure, over the fluoroscopy-compatible table. Fluoroscopy was manipulated, using "Tunnel Vision Technique", to obtain the best possible view of the target area, on the affected side. Parallax error was corrected before commencing the procedure. A "direction-depth-direction" technique was used to introduce the needle under continuous pulsed fluoroscopic guidance. Once the target was reached, antero-posterior, oblique, and lateral fluoroscopic projection views were taken to confirm needle placement in all planes. Permanently recorded images stored by scanning into EMR. Interpretation: Intraoperative imaging interpretation by performing Physician. Adequate needle placement confirmed in AP, Lateral, & Oblique Views. Appropriate spread of contrast to desired area. No evidence of afferent or efferent intravascular uptake. No intrathecal or subarachnoid spread observed. Permanent images scanned  into the patient's record.  Antibiotic Prophylaxis:  Indication(s): No indications identified. Type:  Antibiotics Given (last 72 hours)    None       Post-operative Assessment:   Complications: No immediate post-treatment complications were observed. Disposition: The patient was discharged home, once institutional criteria were met. Return to clinic in 2 weeks for follow-up evaluation and interpretation of results. The patient tolerated the entire procedure well. A repeat set of vitals were taken after the procedure and the patient was kept under observation following institutional policy, for this type of procedure. Post-procedural neurological assessment was performed, showing return to baseline, prior to discharge. The patient was provided with post-procedure discharge instructions, including a section on how to identify potential problems. Should any problems arise concerning this procedure, the patient was given instructions to immediately contact us, at any time, without hesitation. In any case, we plan to contact the patient by telephone for a follow-up status report regarding this interventional procedure. Comments:  No additional relevant information.  Plan of Care   Problem List Items Addressed This Visit      High   Avascular necrosis of left femoral head (HCC) (Chronic)   Chronic hip pain (Location of Primary Source of Pain) (Left) - Primary (Chronic)   Relevant Medications   methylPREDNISolone acetate (DEPO-MEDROL) injection 80 mg   lidocaine (PF) (XYLOCAINE) 1 % injection 10 mL (Completed)   ropivacaine (PF) 2 mg/ml (0.2%) (NAROPIN) epidural 9 mL    Other Visit Diagnoses   None.     Requested PM Follow-up: Return in about 2 weeks (around 10/28/2015) for Post-Procedure evaluation.  Future Appointments Date Time Provider [place]  10/27/2015 8:00 AM Milinda Pointer, MD [place] None    Primary Care Physician: Malachi Carl, MD Location: [facility] Note by: Kathlen Brunswick. Dossie Arbour, M.D, DABA, DABAPM, DABPM, DABIPP, FIPP  Disclaimer:  Medicine is not an exact science. The only guarantee in medicine is that nothing is guaranteed. It is important to note that the decision to proceed with this intervention was based on the information collected from the patient. The Data and conclusions were drawn from the patient's questionnaire, the interview, and the physical examination. Because the information  was provided in large part by the patient, it cannot be guaranteed that it has not been purposely or unconsciously manipulated. Every effort has been made to obtain as much relevant data as possible for this evaluation. It is important to note that the conclusions that lead to this procedure are derived in large part from the available data. Always take into account that the treatment will also be dependent on availability of resources and existing treatment guidelines, considered by other Pain Management Practitioners as being common knowledge and practice, at the time of the intervention. For Medico-Legal purposes, it is also important to point out that variation in procedural techniques and pharmacological choices are the acceptable norm. The indications, contraindications, technique, and results of the above procedure should only be interpreted and judged by a Board-Certified Interventional Pain Specialist with extensive familiarity and expertise in the same exact procedure and technique. Attempts at providing opinions without similar or greater experience and expertise than that of the treating physician will be considered as inappropriate and unethical, and shall result in a formal complaint to the state medical board and applicable specialty societies.

## 2015-10-14 NOTE — Patient Instructions (Signed)

## 2015-10-15 ENCOUNTER — Telehealth: Payer: Self-pay | Admitting: *Deleted

## 2015-10-15 NOTE — Telephone Encounter (Signed)
Message left

## 2015-10-26 DIAGNOSIS — I4891 Unspecified atrial fibrillation: Secondary | ICD-10-CM | POA: Diagnosis not present

## 2015-10-27 ENCOUNTER — Ambulatory Visit: Payer: Medicare Other | Attending: Pain Medicine | Admitting: Pain Medicine

## 2015-10-27 ENCOUNTER — Encounter: Payer: Self-pay | Admitting: Pain Medicine

## 2015-10-27 VITALS — BP 133/70 | HR 61 | Temp 97.8°F | Resp 19 | Ht 61.0 in | Wt 218.0 lb

## 2015-10-27 DIAGNOSIS — K5903 Drug induced constipation: Secondary | ICD-10-CM | POA: Insufficient documentation

## 2015-10-27 DIAGNOSIS — E785 Hyperlipidemia, unspecified: Secondary | ICD-10-CM | POA: Insufficient documentation

## 2015-10-27 DIAGNOSIS — F4310 Post-traumatic stress disorder, unspecified: Secondary | ICD-10-CM | POA: Diagnosis not present

## 2015-10-27 DIAGNOSIS — T402X5A Adverse effect of other opioids, initial encounter: Secondary | ICD-10-CM | POA: Diagnosis not present

## 2015-10-27 DIAGNOSIS — J45909 Unspecified asthma, uncomplicated: Secondary | ICD-10-CM | POA: Diagnosis not present

## 2015-10-27 DIAGNOSIS — E669 Obesity, unspecified: Secondary | ICD-10-CM | POA: Diagnosis not present

## 2015-10-27 DIAGNOSIS — I10 Essential (primary) hypertension: Secondary | ICD-10-CM | POA: Insufficient documentation

## 2015-10-27 DIAGNOSIS — M879 Osteonecrosis, unspecified: Secondary | ICD-10-CM | POA: Diagnosis not present

## 2015-10-27 DIAGNOSIS — K219 Gastro-esophageal reflux disease without esophagitis: Secondary | ICD-10-CM | POA: Insufficient documentation

## 2015-10-27 DIAGNOSIS — M25552 Pain in left hip: Secondary | ICD-10-CM | POA: Insufficient documentation

## 2015-10-27 DIAGNOSIS — M4806 Spinal stenosis, lumbar region: Secondary | ICD-10-CM | POA: Insufficient documentation

## 2015-10-27 DIAGNOSIS — R05 Cough: Secondary | ICD-10-CM | POA: Diagnosis not present

## 2015-10-27 DIAGNOSIS — E039 Hypothyroidism, unspecified: Secondary | ICD-10-CM | POA: Insufficient documentation

## 2015-10-27 DIAGNOSIS — Z6841 Body Mass Index (BMI) 40.0 and over, adult: Secondary | ICD-10-CM | POA: Insufficient documentation

## 2015-10-27 DIAGNOSIS — G8929 Other chronic pain: Secondary | ICD-10-CM | POA: Insufficient documentation

## 2015-10-27 DIAGNOSIS — M533 Sacrococcygeal disorders, not elsewhere classified: Secondary | ICD-10-CM | POA: Insufficient documentation

## 2015-10-27 DIAGNOSIS — R1030 Lower abdominal pain, unspecified: Secondary | ICD-10-CM | POA: Diagnosis present

## 2015-10-27 DIAGNOSIS — I4891 Unspecified atrial fibrillation: Secondary | ICD-10-CM | POA: Insufficient documentation

## 2015-10-27 DIAGNOSIS — Z7901 Long term (current) use of anticoagulants: Secondary | ICD-10-CM | POA: Insufficient documentation

## 2015-10-27 DIAGNOSIS — Z950 Presence of cardiac pacemaker: Secondary | ICD-10-CM | POA: Insufficient documentation

## 2015-10-27 DIAGNOSIS — F40240 Claustrophobia: Secondary | ICD-10-CM | POA: Diagnosis not present

## 2015-10-27 DIAGNOSIS — F419 Anxiety disorder, unspecified: Secondary | ICD-10-CM | POA: Insufficient documentation

## 2015-10-27 DIAGNOSIS — M791 Myalgia: Secondary | ICD-10-CM | POA: Diagnosis present

## 2015-10-27 DIAGNOSIS — R200 Anesthesia of skin: Secondary | ICD-10-CM | POA: Diagnosis not present

## 2015-10-27 DIAGNOSIS — M1612 Unilateral primary osteoarthritis, left hip: Secondary | ICD-10-CM | POA: Diagnosis not present

## 2015-10-27 DIAGNOSIS — I495 Sick sinus syndrome: Secondary | ICD-10-CM | POA: Diagnosis not present

## 2015-10-27 DIAGNOSIS — Z79891 Long term (current) use of opiate analgesic: Secondary | ICD-10-CM | POA: Insufficient documentation

## 2015-10-27 DIAGNOSIS — M7062 Trochanteric bursitis, left hip: Secondary | ICD-10-CM | POA: Diagnosis not present

## 2015-10-27 MED ORDER — LUBIPROSTONE 8 MCG PO CAPS: 8.0000 ug | ORAL_CAPSULE | Freq: Two times a day (BID) | ORAL | 0 refills | Status: DC

## 2015-10-27 MED ORDER — OXYCODONE HCL 5 MG PO TABS: 5.0000 mg | ORAL_TABLET | Freq: Every day | ORAL | 0 refills | Status: DC | PRN

## 2015-10-27 NOTE — Progress Notes (Signed)
Patient's Name: [name]  Patient type: Established  MRN: [id number]  Service setting: Ambulatory outpatient  DOB: 01/31/1941  Location: [facility]  DOS: 10/27/2015  Primary Care Physician: Malachi Carl, MD  Note by: Kathlen Brunswick. Dossie Arbour, M.D, DABA, DABAPM, DABPM, Milagros Evener, [place]  Referring Physician: Malachi Carl, MD  Specialty: Board-Certified Interventional Pain Management  Last Visit to Pain Management: 10/15/2015   Primary Reason(s) for Visit: Encounter for prescription drug management & post-procedure evaluation of chronic illness with mild to moderate exacerbation(Level of risk: moderate) CC: Other (Groin and sometimes back of buttocks)   HPI  [name] is a 75 y.o. year old, female patient, who returns today as an established patient. She has HYPOTHYROIDISM; HYPERLIPIDEMIA; OBESITY; Essential hypertension; SICK SINUS SYNDROME; Cardiac pacemaker in situ; Cough; GERD (gastroesophageal reflux disease); Atrial fibrillation ([place]); Chronic pain; Chronic hip pain (Location of Primary Source of Pain) (Left); Long term current use of opiate analgesic; Long term prescription opiate use; Opiate use (54 MME/Day); Encounter for therapeutic drug level monitoring; Disturbance of skin sensation; Osteoarthritis of hip (Left); Trochanteric bursitis of hip (Left); Lumbar central spinal stenosis (L4-5); Lumbar facet arthropathy; Osteoarthritis of sacroiliac joint (with vacuum phenomena in) (Bilateral); Avascular necrosis of left femoral head (HCC); and Opioid-induced constipation (OIC) on her problem list.. Her primarily concern today is the Other (Groin and sometimes back of buttocks)   Pain Assessment: Self-Reported Pain Score: 4              Reported level is compatible with observation       Pain Type: Chronic pain Pain Location: Groin Pain Orientation: Left Pain Descriptors / Indicators: Aching, Constant, Throbbing Pain Frequency: Constant  The patient comes into the  clinics today for post-procedure evaluation on the interventional treatment done on 10/14/2015. In addition, she comes in today for pharmacological management of her chronic pain.  The patient  reports that she does not use drugs. She is pending to see the surgeon on 11/04/2015. This will be her first visit. She indicates that she has improved since her left hip injection however, clearly she will need it replaced since she has a septic necrosis of the left hip joint and I don't see any other way of treating that other than with a hip replacement. I will continue to manage her chronic pain, but I will let the surgeon work on scene if he can eliminate the source of the majority of this pain.  Date of Last Visit: 10/14/15 Service Provided on Last Visit: Procedure (left hip injection)  Controlled Substance Pharmacotherapy Assessment & REMS (Risk Evaluation and Mitigation Strategy)  Analgesic: Oxycodone IR 5 mg 1 tablet by mouth 5 times a day (25 mg/day) MME/day: 37.5 mg/day.  Pill Count: Bottle labeled oxycodone 5 mg #41/150  Filled 10-01-15. Pharmacokinetics: Onset of action (Liberation/Absorption): Within expected pharmacological parameters Time to Peak effect (Distribution): Timing and results are as within normal expected parameters Duration of action (Metabolism/Excretion): Within normal limits for medication Pharmacodynamics: Analgesic Effect: More than 50% Activity Facilitation: Medication(s) allow patient to sit, stand, walk, and do the basic ADLs Perceived Effectiveness: Described as relatively effective, allowing for increase in activities of daily living (ADL) Side-effects or Adverse reactions: None reported Monitoring: [place] PMP: Online review of the past 41-month period conducted. Compliant with practice rules and regulations Last UDS on record: Tox[employer] Select 13  Date Value Ref Range Status  09/30/2015 FINAL  Final    Comment:     ==================================================================== TOXASSURE SELECT 69 (MW) ====================================================================  Test                             Result       Flag       Units Drug Present and Declared for Prescription Verification   Oxycodone                      405          EXPECTED   ng/mg creat   Oxymorphone                    1262         EXPECTED   ng/mg creat   Noroxycodone                   1066         EXPECTED   ng/mg creat   Noroxymorphone                 321          EXPECTED   ng/mg creat    Sources of oxycodone are scheduled prescription medications.    Oxymorphone, noroxycodone, and noroxymorphone are expected    metabolites of oxycodone. Oxymorphone is also available as a    scheduled prescription medication. Drug Present not Declared for Prescription Verification   Oxazepam                       76           UNEXPECTED ng/mg creat    Oxazepam may be administered as a scheduled prescription    medication; it is also an expected metabolite of other    benzodiazepine drugs, including diazepam, chlordiazepoxide,    prazepam, clorazepate, halazepam, and temazepam. ==================================================================== Test                      Result    Flag   Units      Ref Range   Creatinine              151              mg/dL      >=20 ==================================================================== Declared Medications:  The flagging and interpretation on this report are based on the  following declared medications.  Unexpected results may arise from  inaccuracies in the declared medications.  **Note: The testing scope of this panel includes these medications:  Oxycodone (Roxicodone)  **Note: The testing scope of this panel does not include following  reported medications:  Colchicine  Levothyroxine (Synthroid)  LIOTHYRONINE (Cytomel)  Magnesium (Mag)  Metoprolol (Lopressor)  Multivitamin (MVI)   Potassium (Micro-K)  Supplement  Warfarin (Coumadin) ==================================================================== For clinical consultation, please call [phone]. ====================================================================    UDS interpretation: Compliant Patient informed of the CDC guidelines and recommendations to stay away from the concomitant use of benzodiazepines and opioids due to the increased risk of respiratory depression and death. Medication Assessment Form: Reviewed. Patient indicates being compliant with therapy Treatment compliance: Compliant Risk Assessment: Aberrant Behavior: None observed today Substance Use Disorder (SUD) Risk Level: Low-to-moderate Risk of opioid abuse or dependence: 0.7-3.0% with doses ? 36 MME/day and 6.1-26% with doses ? 120 MME/day. Opioid Risk Tool (ORT) Score:  0 Low Risk for SUD (Score <3) Depression Scale Score: PHQ-2: PHQ-2 Total Score: 0 No depression (0) PHQ-9: PHQ-9 Total Score: 0 No depression (0-4)  Pharmacologic Plan: No change in therapy, at this time  Post-Procedure Assessment  Procedure done on last visit: Diagnostic left intra-articular hip joint injection under fluoroscopic guidance, no sedation. Side-effects or Adverse reactions: None reported Sedation: No sedation used  Results: Ultra-Short Term Relief (First 1 hour after procedure): 100 %  No IV Analgesics or Anxiolytics given, therefore the benefit is completely due to Local Anesthetics Short Term Relief (Initial 4-6 hrs after procedure): 100 % Complete relief confirms area to be the source of pain Long Term Relief : 75 % (lasting 2 weeks) Long-term benefit would suggest an inflammatory etiology to the pain         Current Relief (Now): 50%  Persistent relief would suggest effective anti-inflammatory effects from steroids Interpretation of Results: Based on the results of this diagnostic injection, the source of this patient's pain is her left hip  or we have shown that she has a septic necrosis of the joint. She is pending to see orthopedic surgeon for replacement.  Laboratory Chemistry  Inflammation Markers No results found for: ESRSEDRATE, CRP  Renal Function Lab Results  Component Value Date   BUN 37 (H) 07/23/2015   CREATININE 1.14 (H) 07/23/2015   GFRAA 53 (L) 07/23/2015   GFRNONAA 46 (L) 07/23/2015    Hepatic Function Lab Results  Component Value Date   AST 24 05/22/2009   ALT 20 05/22/2009   ALBUMIN 3.0 (L) 05/22/2009    Electrolytes Lab Results  Component Value Date   NA 136 07/23/2015   K 4.1 07/23/2015   CL 99 (L) 07/23/2015   CALCIUM 10.2 07/23/2015    Pain Modulating Vitamins Lab Results  Component Value Date   25OHVITD1 42 09/30/2015   25OHVITD2 7.3 09/30/2015   25OHVITD3 35 09/30/2015   VITAMINB12 >7,500 (H) 09/30/2015    Coagulation Parameters Lab Results  Component Value Date   INR 1.87 (H) 07/23/2015   LABPROT 21.5 (H) 07/23/2015   APTT 34 07/23/2015   PLT 181 07/23/2015    Cardiovascular Lab Results  Component Value Date   HGB 14.8 07/23/2015   HCT 43.0 07/23/2015    Note: Lab results reviewed.  Recent Diagnostic Imaging  No results found.  Meds  The patient has a current medication list which includes the following prescription(s): colchicine, hydroxocobalamin, levothyroxine, levothyroxine, liothyronine, magnesium sulfate, metoprolol, multivitamin with minerals, oxycodone, potassium chloride, warfarin, benefiber, and lubiprostone.  Current Outpatient Prescriptions on File Prior to Visit  Medication Sig  . colchicine 0.6 MG tablet Take 0.6 mg by mouth daily as needed (GOUT).   . Hydroxocobalamin 1000 MCG/ML SOLN INJ 1 ML INTO THE MUSCLE TWICE WEEKLY  . levothyroxine (SYNTHROID, LEVOTHROID) 25 MCG tablet Take 25 mcg by mouth daily before breakfast.   . levothyroxine (SYNTHROID, LEVOTHROID) 50 MCG tablet Take 50 mcg by mouth daily before breakfast.   . liothyronine  (CYTOMEL) 25 MCG tablet 25 mcg daily.   . magnesium sulfate 50 % injection INJ 1 ML INTO THE MUSCLE PRF MUSCLE CRAMPS UTD  . metoprolol (LOPRESSOR) 50 MG tablet Take 1.5 tablets (75 mg total) by mouth 2 (two) times daily.  . Multiple Vitamin (MULTIVITAMIN WITH MINERALS) TABS tablet Take 1 tablet by mouth daily.  . potassium chloride (MICRO-K) 10 MEQ CR capsule Take 10 mEq by mouth 2 (two) times daily.  Marland Kitchen warfarin (COUMADIN) 5 MG tablet Take 5 mg by mouth daily. Sun-0.5 half tab (2.5 mg total); All other days-1 tab (5 mg total)   . Wheat Dextrin (BENEFIBER) POWD Stir  2 tsp. TID into 4-8 oz of any non-carbonated beverage or soft food (hot or cold)   No current facility-administered medications on file prior to visit.     ROS  Constitutional: Denies any fever or chills Gastrointestinal: No reported hemesis, hematochezia, vomiting, or acute GI distress Musculoskeletal: Denies any acute onset joint swelling, redness, loss of ROM, or weakness Neurological: No reported episodes of acute onset apraxia, aphasia, dysarthria, agnosia, amnesia, paralysis, loss of coordination, or loss of consciousness  Allergies  Ms. Snelling is allergic to contrast media [iodinated diagnostic agents].  [place]  Medical:  Ms. Mcbreairty  has a past medical history of Anxiety; Aortic insufficiency; Arthritis; Asthma; Bronchitis; Bursitis, hip; Claustrophobia; GERD (gastroesophageal reflux disease); Hyperlipidemia; Hypertension; Hypothyroidism; Mitral regurgitation; Obesity; Paroxysmal atrial fibrillation (HCC); PTSD (post-traumatic stress disorder); Rhinitis; Sick sinus syndrome ([place]); and Sleep apnea. Family: family history includes Alzheimer's disease in her father; Arthritis in her sister; Cancer in her brother and maternal grandfather; Heart disease in her mother; Hypertension in her sister; Stroke in her mother. Surgical:  has a past surgical history that includes Tonsillectomy; Pacemaker insertion; Status post child birth x1;  Rotator cuff repair; and Dilatation & curettage/hysteroscopy with myosure (N/A, 08/03/2015). Tobacco:  reports that she has never smoked. She has never used smokeless tobacco. Alcohol:  reports that she does not drink alcohol. Drug:  reports that she does not use drugs.  Constitutional Exam  Vitals: Blood pressure 133/70, pulse 61, temperature 97.8 F (36.6 C), temperature source Oral, resp. rate 19, height 5\' 1"  (1.549 m), weight 218 lb (98.9 kg), SpO2 100 %. General appearance: Well nourished, well developed, and well hydrated. In no acute distress Calculated BMI/Body habitus: Body mass index is 41.19 kg/m. (>40 kg/m2) Extreme obesity (Class III) - 254% higher incidence of chronic pain Psych/Mental status: Alert and oriented x 3 (person, place, & time) Eyes: PERLA Respiratory: No evidence of acute respiratory distress  Cervical Spine Exam  Inspection: No masses, redness, or swelling Alignment: Symmetrical Functional ROM: ROM appears unrestricted Stability: No instability detected Muscle strength & Tone: Functionally intact Sensory: Unimpaired Palpation: Non-contributory  Upper Extremity (UE) Exam    Side: Right upper extremity  Side: Left upper extremity  Inspection: No masses, redness, swelling, or asymmetry  Inspection: No masses, redness, swelling, or asymmetry  Functional ROM: ROM appears unrestricted  Functional ROM: ROM appears unrestricted  Muscle strength & Tone: Functionally intact  Muscle strength & Tone: Functionally intact  Sensory: Unimpaired  Sensory: Unimpaired  Palpation: Non-contributory  Palpation: Non-contributory   Thoracic Spine Exam  Inspection: No masses, redness, or swelling Alignment: Symmetrical Functional ROM: ROM appears unrestricted Stability: No instability detected Sensory: Unimpaired Muscle strength & Tone: Functionally intact Palpation: Non-contributory  Lumbar Spine Exam  Inspection: No masses, redness, or swelling Alignment:  Symmetrical Functional ROM: ROM appears unrestricted Stability: No instability detected Muscle strength & Tone: Functionally intact Sensory: Unimpaired Palpation: Non-contributory Provocative Tests: Lumbar Hyperextension and rotation test: evaluation deferred today       Patrick's Maneuver: evaluation deferred today              Gait & Posture Assessment  Ambulation: Patient ambulates using a walker Gait: Very limited, using assistive device to ambulate Posture: WNL   Lower Extremity Exam    Side: Right lower extremity  Side: Left lower extremity  Inspection: No masses, redness, swelling, or asymmetry  Inspection: No masses, redness, swelling, or asymmetry  Functional ROM: Decreased ROM  Functional ROM: Decreased ROM  Muscle strength & Tone: Functionally  intact  Muscle strength & Tone: Functionally intact  Sensory: Unimpaired  Sensory: Unimpaired  Palpation: Non-contributory  Palpation: Non-contributory    Assessment & Plan  Primary Diagnosis & Pertinent Problem List: The primary encounter diagnosis was Chronic pain. A diagnosis of Opioid-induced constipation (OIC) was also pertinent to this visit.  Visit Diagnosis: 1. Chronic pain   2. Opioid-induced constipation (OIC)     Problems updated and reviewed during this visit: No problems updated.  Problem-specific Plan(s): No problem-specific Assessment & Plan notes found for this encounter.  No new Assessment & Plan notes have been filed under this hospital service since the last note was generated. Service: Pain Management   Plan of Care   Problem List Items Addressed This Visit      High   Chronic pain - Primary (Chronic)   Relevant Medications   oxyCODONE (OXY IR/ROXICODONE) 5 MG immediate release tablet (Start on 11/01/2015)     Medium   Opioid-induced constipation (OIC) (Chronic)   Relevant Medications   lubiprostone (AMITIZA) 8 MCG capsule    Other Visit Diagnoses   None.      Pharmacotherapy  (Medications Ordered): Meds ordered this encounter  Medications  . oxyCODONE (OXY IR/ROXICODONE) 5 MG immediate release tablet    Sig: Take 1 tablet (5 mg total) by mouth 5 (five) times daily as needed for severe pain.    Dispense:  150 tablet    Refill:  0    Do not place this medication, or any other prescription from our practice, on "Automatic Refill". Patient may have prescription filled one day early if pharmacy is closed on scheduled refill date. Do not fill until: 11/01/15 To last until: 12/01/15  . lubiprostone (AMITIZA) 8 MCG capsule    Sig: Take 1 capsule (8 mcg total) by mouth 2 (two) times daily with a meal. Swallow the medication whole. Do not break or chew the medication.    Dispense:  60 capsule    Refill:  0    Do not place this medication, or any other prescription from our practice, on "Automatic Refill". Patient may have prescription filled one day early if pharmacy is closed on scheduled refill date.    Lab-work & Procedure Ordered: No orders of the defined types were placed in this encounter.   Imaging Ordered: None  Interventional Therapies: Scheduled:  None at this time.    Considering:  None at this time.    PRN Procedures:  None at this time.    Referral(s) or Consult(s): None at this time.  New Prescriptions   No medications on file    Medications administered during this visit: Ms. Mudrak had no medications administered during this visit.  Requested PM Follow-up: Return in 5 weeks (on 11/29/2015) for Med-Mgmt.  Future Appointments Date Time Provider [place]  12/01/2015 1:20 PM Milinda Pointer, MD [facility] None    Primary Care Physician: Malachi Carl, MD Location: [facility] Note by: Kathlen Brunswick. Dossie Arbour, M.D, DABA, DABAPM, DABPM, DABIPP, FIPP  Pain Score Disclaimer: We use the NRS-11 scale. This is a self-reported, subjective measurement of pain severity with only modest accuracy. It is used  primarily to identify changes within a particular patient. It must be understood that outpatient pain scales are significantly less accurate that those used for research, where they can be applied under ideal controlled circumstances with minimal exposure to variables. In reality, the score is likely to be a combination of pain intensity and pain affect, where pain affect  describes the degree of emotional arousal or changes in action readiness caused by the sensory experience of pain. Factors such as social and work situation, setting, emotional state, anxiety levels, expectation, and prior pain experience may influence pain perception and show large inter-individual differences that may also be affected by time variables.  Patient instructions provided at this appointment:: There are no Patient Instructions on file for this visit.

## 2015-10-27 NOTE — Progress Notes (Signed)
Safety precautions to be maintained throughout the outpatient stay will include: orient to surroundings, keep bed in low position, maintain call bell within reach at all times, provide assistance with transfer out of bed and ambulation.  Bottle labeled oxycodone 5 mg #41/150  Filled 10-01-15

## 2015-11-04 DIAGNOSIS — M87052 Idiopathic aseptic necrosis of left femur: Secondary | ICD-10-CM | POA: Diagnosis not present

## 2015-11-18 ENCOUNTER — Telehealth: Payer: Self-pay | Admitting: Internal Medicine

## 2015-11-18 NOTE — Telephone Encounter (Signed)
New message      Request for surgical clearance:  1. What type of surgery is being performed? Total left hip replacement  When is this surgery scheduled? 12-22-15 Are there any medications that need to be held prior to surgery and how long?  Hold coumadin and need medical clearance 2. Name of physician performing surgery?  Dr Marry Guan  What is your office phone and fax number?  Fax [phone]

## 2015-11-18 NOTE — Telephone Encounter (Signed)
Patient was last seen by Dr. Harrington Challenger 08/20/15.  Has pacemaker.  Will forward to Dr. Harrington Challenger for review/advisement.

## 2015-11-19 NOTE — Telephone Encounter (Signed)
Patient OK to proceed with surgery  Low risk for major cardiac event.

## 2015-11-19 NOTE — Telephone Encounter (Signed)
Faxed via EPIC to Dr. Marry Guan at fax number provided below.

## 2015-11-23 DIAGNOSIS — I4891 Unspecified atrial fibrillation: Secondary | ICD-10-CM | POA: Diagnosis not present

## 2015-11-24 ENCOUNTER — Telehealth: Payer: Self-pay | Admitting: *Deleted

## 2015-11-24 NOTE — Telephone Encounter (Signed)
Received second anesthesia request for clearance.  Re-faxed through [facility] cardiac clearance from Dr. Harrington Challenger for patient to have left total hip arthroplasty on 12/22/15.  Attention [place] [phone].

## 2015-12-01 ENCOUNTER — Ambulatory Visit: Payer: Medicare Other | Attending: Pain Medicine | Admitting: Pain Medicine

## 2015-12-01 ENCOUNTER — Encounter: Payer: Self-pay | Admitting: Pain Medicine

## 2015-12-01 VITALS — BP 139/75 | HR 77 | Temp 98.0°F | Resp 16 | Ht 61.0 in | Wt 218.0 lb

## 2015-12-01 DIAGNOSIS — M87052 Idiopathic aseptic necrosis of left femur: Secondary | ICD-10-CM | POA: Insufficient documentation

## 2015-12-01 DIAGNOSIS — Z823 Family history of stroke: Secondary | ICD-10-CM | POA: Diagnosis not present

## 2015-12-01 DIAGNOSIS — T402X5A Adverse effect of other opioids, initial encounter: Secondary | ICD-10-CM | POA: Insufficient documentation

## 2015-12-01 DIAGNOSIS — Z950 Presence of cardiac pacemaker: Secondary | ICD-10-CM | POA: Insufficient documentation

## 2015-12-01 DIAGNOSIS — F1190 Opioid use, unspecified, uncomplicated: Secondary | ICD-10-CM

## 2015-12-01 DIAGNOSIS — E785 Hyperlipidemia, unspecified: Secondary | ICD-10-CM | POA: Insufficient documentation

## 2015-12-01 DIAGNOSIS — X58XXXA Exposure to other specified factors, initial encounter: Secondary | ICD-10-CM | POA: Insufficient documentation

## 2015-12-01 DIAGNOSIS — Z91041 Radiographic dye allergy status: Secondary | ICD-10-CM | POA: Insufficient documentation

## 2015-12-01 DIAGNOSIS — G894 Chronic pain syndrome: Secondary | ICD-10-CM

## 2015-12-01 DIAGNOSIS — M1610 Unilateral primary osteoarthritis, unspecified hip: Secondary | ICD-10-CM | POA: Diagnosis not present

## 2015-12-01 DIAGNOSIS — Z79891 Long term (current) use of opiate analgesic: Secondary | ICD-10-CM | POA: Insufficient documentation

## 2015-12-01 DIAGNOSIS — F4310 Post-traumatic stress disorder, unspecified: Secondary | ICD-10-CM | POA: Insufficient documentation

## 2015-12-01 DIAGNOSIS — R1030 Lower abdominal pain, unspecified: Secondary | ICD-10-CM | POA: Insufficient documentation

## 2015-12-01 DIAGNOSIS — M48061 Spinal stenosis, lumbar region without neurogenic claudication: Secondary | ICD-10-CM | POA: Diagnosis not present

## 2015-12-01 DIAGNOSIS — Z8249 Family history of ischemic heart disease and other diseases of the circulatory system: Secondary | ICD-10-CM | POA: Insufficient documentation

## 2015-12-01 DIAGNOSIS — Z809 Family history of malignant neoplasm, unspecified: Secondary | ICD-10-CM | POA: Diagnosis not present

## 2015-12-01 DIAGNOSIS — Z7901 Long term (current) use of anticoagulants: Secondary | ICD-10-CM | POA: Insufficient documentation

## 2015-12-01 DIAGNOSIS — Z9889 Other specified postprocedural states: Secondary | ICD-10-CM | POA: Diagnosis not present

## 2015-12-01 DIAGNOSIS — Z8261 Family history of arthritis: Secondary | ICD-10-CM | POA: Diagnosis not present

## 2015-12-01 DIAGNOSIS — I10 Essential (primary) hypertension: Secondary | ICD-10-CM | POA: Insufficient documentation

## 2015-12-01 DIAGNOSIS — K5903 Drug induced constipation: Secondary | ICD-10-CM | POA: Diagnosis not present

## 2015-12-01 DIAGNOSIS — E039 Hypothyroidism, unspecified: Secondary | ICD-10-CM | POA: Insufficient documentation

## 2015-12-01 DIAGNOSIS — Z6841 Body Mass Index (BMI) 40.0 and over, adult: Secondary | ICD-10-CM | POA: Diagnosis not present

## 2015-12-01 DIAGNOSIS — K219 Gastro-esophageal reflux disease without esophagitis: Secondary | ICD-10-CM | POA: Diagnosis not present

## 2015-12-01 MED ORDER — OXYCODONE HCL 5 MG PO TABS: 5.0000 mg | ORAL_TABLET | Freq: Every day | ORAL | 0 refills | Status: DC | PRN

## 2015-12-01 MED ORDER — BENEFIBER PO POWD: ORAL | 2 refills | Status: DC

## 2015-12-01 MED ORDER — LUBIPROSTONE 8 MCG PO CAPS: 8.0000 ug | ORAL_CAPSULE | Freq: Two times a day (BID) | ORAL | 0 refills | Status: DC

## 2015-12-01 NOTE — Progress Notes (Signed)
Patient's Name: [name]  MRN: [id number]  Referring Provider: Malachi Carl, MD  DOB: 1940/08/14  PCP: Malachi Carl, MD  DOS: 12/01/2015  Note by: Kathlen Brunswick. Dossie Arbour, MD  Service setting: Ambulatory outpatient  Specialty: Interventional Pain Management  Location: [facility] (AMB) Pain Management Facility    Patient type: Established   Primary Reason(s) for Visit: Encounter for prescription drug management (Level of risk: moderate) CC: Groin Pain  HPI  [name] is a 75 y.o. year old, female patient, who comes today for an initial evaluation. She has HYPOTHYROIDISM; HYPERLIPIDEMIA; OBESITY; Essential hypertension; SICK SINUS SYNDROME; Cardiac pacemaker in situ; Cough; GERD (gastroesophageal reflux disease); Atrial fibrillation ([place]); Chronic pain; Chronic hip pain (Location of Primary Source of Pain) (Left); Long term current use of opiate analgesic; Long term prescription opiate use; Opiate use (54 MME/Day); Encounter for therapeutic drug level monitoring; Disturbance of skin sensation; Osteoarthritis of hip (Left); Trochanteric bursitis of hip (Left); Lumbar central spinal stenosis (L4-5); Lumbar facet arthropathy; Osteoarthritis of sacroiliac joint (with vacuum phenomena in) (Bilateral); Avascular necrosis of left femoral head ([place]); Opioid-induced constipation (OIC); Hypothyroidism (acquired); Long term (current) use of opiate analgesic; Morbid obesity with BMI of 40.0-44.9, adult ([place]); PTSD (post-traumatic stress disorder); and Spinal stenosis of lumbar region on her problem list.. Her primarily concern today is the Groin Pain  Pain Assessment: Self-Reported Pain Score: 6 /10 Clinically the patient looks like a 2/10 Reported level is inconsistent with clinical observations. Information on the proper use of the pain score provided to the patient today. Pain Type: Chronic pain Pain Location: Groin Pain Orientation: Left Pain Descriptors / Indicators: Aching, Constant, Crying, Throbbing Pain  Frequency: Constant  The patient comes into the clinics today for pharmacological management of her chronic pain. I last saw this patient on 10/27/2015. The patient  reports that she does not use drugs. Her body mass index is 41.19 kg/m. The patient is pending to have surgery on 12/02/2015 jewelry replace her left hip. This will be done by Dr. Marry Guan. Today we have provided the patient with a copy of the postprocedure pain management handout so that she can give it to Dr. Marry Guan to be scanned into his records. Today we have also talked about physical therapy after the surgery and how she needs to make sure to Hollow all of the recommendations and orders by Dr. Marry Guan. I have also talked to her about working on losing some weight, after this surgery.  Date of Last Visit: 10/27/15 Service Provided on Last Visit: Med Refill  Controlled Substance Pharmacotherapy Assessment & REMS (Risk Evaluation and Mitigation Strategy)  Analgesic:Oxycodone IR 5 mg 1 tablet by mouth 5 times a day (25 mg/day) MME/day:37.5mg /day Pill Count: Oxycodone 5mg  3/150 Last filled 11/01/15. Pharmacokinetics: Onset of action (Liberation/Absorption): Within expected pharmacological parameters Time to Peak effect (Distribution): Timing and results are as within normal expected parameters Duration of action (Metabolism/Excretion): Within normal limits for medication Pharmacodynamics: Analgesic Effect: More than 50% Activity Facilitation: Medication(s) allow patient to sit, stand, walk, and do the basic ADLs Perceived Effectiveness: Described as relatively effective, allowing for increase in activities of daily living (ADL) Side-effects or Adverse reactions: None reported Monitoring:  PMP: Online review of the past 43-month period conducted. Compliant with practice rules and regulations List of all UDS test(s) done:  Lab Results  Component Value Date   TOXASSSELUR FINAL 09/30/2015   SUMMARY FINAL 08/11/2015   Last UDS on  record: ToxAssure Select 13  Date Value Ref Range Status  09/30/2015 FINAL  Final    Comment:    ==================================================================== TOXASSURE SELECT 13 (MW) ==================================================================== Test                             Result       Flag       Units Drug Present and Declared for Prescription Verification   Oxycodone                      405          EXPECTED   ng/mg creat   Oxymorphone                    1262         EXPECTED   ng/mg creat   Noroxycodone                   1066         EXPECTED   ng/mg creat   Noroxymorphone                 321          EXPECTED   ng/mg creat    Sources of oxycodone are scheduled prescription medications.    Oxymorphone, noroxycodone, and noroxymorphone are expected    metabolites of oxycodone. Oxymorphone is also available as a    scheduled prescription medication. Drug Present not Declared for Prescription Verification   Oxazepam                       76           UNEXPECTED ng/mg creat    Oxazepam may be administered as a scheduled prescription    medication; it is also an expected metabolite of other    benzodiazepine drugs, including diazepam, chlordiazepoxide,    prazepam, clorazepate, halazepam, and temazepam. ==================================================================== Test                      Result    Flag   Units      Ref Range   Creatinine              151              mg/dL      >=20 ==================================================================== Declared Medications:  The flagging and interpretation on this report are based on the  following declared medications.  Unexpected results may arise from  inaccuracies in the declared medications.  **Note: The testing scope of this panel includes these medications:  Oxycodone (Roxicodone)  **Note: The testing scope of this panel does not include following  reported medications:  Colchicine  Levothyroxine  (Synthroid)  LIOTHYRONINE (Cytomel)  Magnesium (Mag)  Metoprolol (Lopressor)  Multivitamin (MVI)  Potassium (Micro-K)  Supplement  Warfarin (Coumadin) ==================================================================== For clinical consultation, please call [phone]. ====================================================================    Summary  Date Value Ref Range Status  08/11/2015 FINAL  Final    Comment:    ==================================================================== TOXASSURE COMP DRUG ANALYSIS,UR ==================================================================== Test                             Result       Flag       Units Drug Present and Declared for Prescription Verification   Codeine  1060         EXPECTED   ng/mg creat   Morphine                       464          EXPECTED   ng/mg creat   Norcodeine                     63           EXPECTED   ng/mg creat    Sources of codeine include scheduled prescription medications;    morphine is an expected metabolite of codeine. Other sources of    morphine include scheduled prescription medications or as a    metabolite of heroin.    Norcodeine is an expected metabolite of codeine.   Cyclobenzaprine                PRESENT      EXPECTED   Acetaminophen                  PRESENT      EXPECTED   Metoprolol                     PRESENT      EXPECTED Drug Present not Declared for Prescription Verification   Desmethyldiazepam              57           UNEXPECTED ng/mg creat   Oxazepam                       174          UNEXPECTED ng/mg creat   Temazepam                      104          UNEXPECTED ng/mg creat    Desmethyldiazepam, oxazepam, and temazepam are benzodiazepine    drugs, but may also be present as common metabolites of other    benzodiazepine drugs, including diazepam. ==================================================================== Test                      Result    Flag    Units      Ref Range   Creatinine              109              mg/dL      >=20 ==================================================================== Declared Medications:  The flagging and interpretation on this report are based on the  following declared medications.  Unexpected results may arise from  inaccuracies in the declared medications.  **Note: The testing scope of this panel includes these medications:  Codeine (Acetaminophen-Codeine)  Cyclobenzaprine  Metoprolol  **Note: The testing scope of this panel does not include small to  moderate amounts of these reported medications:  Acetaminophen (Acetaminophen-Codeine)  **Note: The testing scope of this panel does not include following  reported medications:  Colchicine  Hydrochlorothiazide (Triamterine-Hydrochlorthzide)  Levothyroxine  Liothyronine  Multivitamin  Potassium  Triamterene (Triamterine-Hydrochlorthzide)  Warfarin ==================================================================== For clinical consultation, please call [phone]. ====================================================================    UDS interpretation: Compliant          Medication Assessment Form: Reviewed. Patient indicates being compliant with therapy Treatment compliance: Compliant Risk Assessment: Aberrant Behavior: None observed today Substance Use Disorder (SUD) Risk Level: No change  since last visit Risk of opioid abuse or dependence: 0.7-3.0% with doses ? 36 MME/day and 6.1-26% with doses ? 120 MME/day. Opioid Risk Tool (ORT) Score: 0           Depression Scale Score: PHQ-2: 0           PHQ-9: 0            Pharmacologic Plan: No change in therapy, at this time  Laboratory Chemistry  Inflammation Markers No results found for: ESRSEDRATE, CRP Renal Function Lab Results  Component Value Date   BUN 37 (H) 07/23/2015   CREATININE 1.14 (H) 07/23/2015   GFRAA 53 (L) 07/23/2015   GFRNONAA 46 (L) 07/23/2015   Hepatic  Function Lab Results  Component Value Date   AST 24 05/22/2009   ALT 20 05/22/2009   ALBUMIN 3.0 (L) 05/22/2009   Electrolytes Lab Results  Component Value Date   NA 136 07/23/2015   K 4.1 07/23/2015   CL 99 (L) 07/23/2015   CALCIUM 10.2 07/23/2015   Pain Modulating Vitamins Lab Results  Component Value Date   25OHVITD1 42 09/30/2015   25OHVITD2 7.3 09/30/2015   25OHVITD3 35 09/30/2015   VITAMINB12 >7,500 (H) 09/30/2015   Coagulation Parameters Lab Results  Component Value Date   INR 1.87 (H) 07/23/2015   LABPROT 21.5 (H) 07/23/2015   APTT 34 07/23/2015   PLT 181 07/23/2015   Cardiovascular Lab Results  Component Value Date   HGB 14.8 07/23/2015   HCT 43.0 07/23/2015    Note: Lab results reviewed.  Recent Diagnostic Imaging  No results found. Meds  The patient has a current medication list which includes the following prescription(s): colchicine, hydroxocobalamin, levothyroxine, levothyroxine, liothyronine, lubiprostone, magnesium sulfate, metoprolol, multivitamin with minerals, oxycodone, oxycodone, oxycodone, potassium chloride, warfarin, and benefiber.  Current Outpatient Prescriptions on File Prior to Visit  Medication Sig  . colchicine 0.6 MG tablet Take 0.6 mg by mouth daily as needed (GOUT).   . Hydroxocobalamin 1000 MCG/ML SOLN INJ 1 ML INTO THE MUSCLE TWICE WEEKLY  . levothyroxine (SYNTHROID, LEVOTHROID) 25 MCG tablet Take 25 mcg by mouth daily before breakfast.   . levothyroxine (SYNTHROID, LEVOTHROID) 50 MCG tablet Take 50 mcg by mouth daily before breakfast.   . liothyronine (CYTOMEL) 25 MCG tablet 25 mcg daily.   . magnesium sulfate 50 % injection INJ 1 ML INTO THE MUSCLE PRF MUSCLE CRAMPS UTD  . metoprolol (LOPRESSOR) 50 MG tablet Take 1.5 tablets (75 mg total) by mouth 2 (two) times daily.  . Multiple Vitamin (MULTIVITAMIN WITH MINERALS) TABS tablet Take 1 tablet by mouth daily.  . potassium chloride (MICRO-K) 10 MEQ CR capsule Take 10 mEq by  mouth 2 (two) times daily.  Marland Kitchen warfarin (COUMADIN) 5 MG tablet Take 5 mg by mouth daily. Sun-0.5 half tab (2.5 mg total); All other days-1 tab (5 mg total)    No current facility-administered medications on file prior to visit.    ROS  Constitutional: Denies any fever or chills Gastrointestinal: No reported hemesis, hematochezia, vomiting, or acute GI distress Musculoskeletal: Denies any acute onset joint swelling, redness, loss of ROM, or weakness Neurological: No reported episodes of acute onset apraxia, aphasia, dysarthria, agnosia, amnesia, paralysis, loss of coordination, or loss of consciousness  Allergies  Ms. Hoheisel is allergic to contrast media [iodinated diagnostic agents].  [place]  Medical:  Ms. Ravi  has a past medical history of Anxiety; Aortic insufficiency; Arthritis; Asthma; Bronchitis; Bursitis, hip; Claustrophobia; GERD (gastroesophageal reflux disease); Hyperlipidemia; Hypertension; Hypothyroidism;  Mitral regurgitation; Obesity; Paroxysmal atrial fibrillation (HCC); PTSD (post-traumatic stress disorder); Rhinitis; Sick sinus syndrome ([place]); and Sleep apnea. Family: family history includes Alzheimer's disease in her father; Arthritis in her sister; Cancer in her brother and maternal grandfather; Heart disease in her mother; Hypertension in her sister; Stroke in her mother. Surgical:  has a past surgical history that includes Tonsillectomy; Pacemaker insertion; Status post child birth x1; Rotator cuff repair; Dilatation & curettage/hysteroscopy with myosure (N/A, 08/03/2015); and Total hip arthroplasty. Tobacco:  reports that she has never smoked. She has never used smokeless tobacco. Alcohol:  reports that she does not drink alcohol. Drug:  reports that she does not use drugs.  Constitutional Exam  General appearance: Well nourished, well developed, and well hydrated. In no acute distress Vitals:   12/01/15 1300  BP: 139/75  Pulse: 77  Resp: 16  Temp: 98 F (36.7 C)   SpO2: 100%  Weight: 218 lb (98.9 kg)  Height: 5\' 1"  (1.549 m)  BMI Assessment: Estimated body mass index is 41.19 kg/m as calculated from the following:   Height as of this encounter: 5\' 1"  (1.549 m).   Weight as of this encounter: 218 lb (98.9 kg).   BMI interpretation:           BMI Readings from Last 4 Encounters:  12/01/15 41.19 kg/m  10/27/15 41.19 kg/m  10/14/15 41.57 kg/m  09/30/15 41.95 kg/m   Wt Readings from Last 4 Encounters:  12/01/15 218 lb (98.9 kg)  10/27/15 218 lb (98.9 kg)  10/14/15 220 lb (99.8 kg)  09/30/15 222 lb (100.7 kg)  Psych/Mental status: Alert and oriented x 3 (person, place, & time) Eyes: PERLA Respiratory: No evidence of acute respiratory distress  Cervical Spine Exam  Inspection: No masses, redness, or swelling Alignment: Symmetrical Functional ROM: Unrestricted ROM Stability: No instability detected Muscle strength & Tone: Functionally intact Sensory: Unimpaired Palpation: Non-contributory  Upper Extremity (UE) Exam    Side: Right upper extremity  Side: Left upper extremity  Inspection: No masses, redness, swelling, or asymmetry  Inspection: No masses, redness, swelling, or asymmetry  Functional ROM: Unrestricted ROM         Functional ROM: Unrestricted ROM          Muscle strength & Tone: Functionally intact  Muscle strength & Tone: Functionally intact  Sensory: Unimpaired  Sensory: Unimpaired  Palpation: Non-contributory  Palpation: Non-contributory   Thoracic Spine Exam  Inspection: No masses, redness, or swelling Alignment: Symmetrical Functional ROM: Unrestricted ROM Stability: No instability detected Sensory: Unimpaired Muscle strength & Tone: Functionally intact Palpation: Non-contributory  Lumbar Spine Exam  Inspection: No masses, redness, or swelling Alignment: Symmetrical Functional ROM: Unrestricted ROM Stability: No instability detected Muscle strength & Tone: Functionally intact Sensory: Unimpaired Palpation:  Non-contributory Provocative Tests: Lumbar Hyperextension and rotation test: evaluation deferred today       Patrick's Maneuver: evaluation deferred today              Gait & Posture Assessment  Ambulation: Unassisted Gait: Relatively normal for age and body habitus Posture: WNL   Lower Extremity Exam    Side: Right lower extremity  Side: Left lower extremity  Inspection: No masses, redness, swelling, or asymmetry  Inspection: No masses, redness, swelling, or asymmetry  Functional ROM: Unrestricted ROM          Functional ROM: Unrestricted ROM          Muscle strength & Tone: Functionally intact  Muscle strength & Tone: Functionally intact  Sensory: Unimpaired  Sensory: Unimpaired  Palpation: Non-contributory  Palpation: Non-contributory   Assessment  Primary Diagnosis & Pertinent Problem List: The primary encounter diagnosis was Avascular necrosis of left femoral head ([place]). Diagnoses of Long term current use of opiate analgesic, Opiate use (54 MME/Day), Chronic pain syndrome, and Opioid-induced constipation (OIC) were also pertinent to this visit.  Visit Diagnosis: 1. Avascular necrosis of left femoral head (HCC)   2. Long term current use of opiate analgesic   3. Opiate use (54 MME/Day)   4. Chronic pain syndrome   5. Opioid-induced constipation (OIC)    Plan of Care  Pharmacotherapy (Medications Ordered): Meds ordered this encounter  Medications  . oxyCODONE (OXY IR/ROXICODONE) 5 MG immediate release tablet    Sig: Take 1 tablet (5 mg total) by mouth 5 (five) times daily as needed for severe pain.    Dispense:  150 tablet    Refill:  0    Do not place this medication, or any other prescription from our practice, on "Automatic Refill". Patient may have prescription filled one day early if pharmacy is closed on scheduled refill date. Do not fill until: 12/01/15 To last until: 12/31/15  . lubiprostone (AMITIZA) 8 MCG capsule    Sig: Take 1 capsule (8 mcg total) by mouth 2  (two) times daily with a meal. Swallow the medication whole. Do not break or chew the medication.    Dispense:  180 capsule    Refill:  0    Do not place this medication, or any other prescription from our practice, on "Automatic Refill". Patient may have prescription filled one day early if pharmacy is closed on scheduled refill date.  . Wheat Dextrin (BENEFIBER) POWD    Sig: Stir 2 tsp. TID into 4-8 oz of any non-carbonated beverage or soft food (hot or cold)    Dispense:  500 g    Refill:  2    This is an OTC product. This prescription is to serve as a reminder to the patient as to our preference.  Marland Kitchen oxyCODONE (OXY IR/ROXICODONE) 5 MG immediate release tablet    Sig: Take 1 tablet (5 mg total) by mouth 5 (five) times daily as needed for severe pain.    Dispense:  150 tablet    Refill:  0    Do not place this medication, or any other prescription from our practice, on "Automatic Refill". Patient may have prescription filled one day early if pharmacy is closed on scheduled refill date. Do not fill until: 12/31/15 To last until: 01/30/16  . oxyCODONE (OXY IR/ROXICODONE) 5 MG immediate release tablet    Sig: Take 1 tablet (5 mg total) by mouth 5 (five) times daily as needed for severe pain.    Dispense:  150 tablet    Refill:  0    Do not place this medication, or any other prescription from our practice, on "Automatic Refill". Patient may have prescription filled one day early if pharmacy is closed on scheduled refill date. Do not fill until: 01/30/16 To last until: 02/29/16   New Prescriptions   No medications on file   Medications administered during this visit: Ms. Rennison had no medications administered during this visit. Lab-work, Procedure(s), & Referral(s) Ordered: No orders of the defined types were placed in this encounter.  Imaging & Referral(s) Ordered: None  Interventional Therapies: Scheduled:  None at this time.    Considering:  None at this time.    PRN  Procedures:  None at this time.  Requested PM Follow-up: Return in 3 months (on 02/22/2016) for Med-Mgmt.  Future Appointments Date Time Provider Between  12/08/2015 1:00 PM [facility] PAT2 [facility] None  02/15/2016 7:45 AM Milinda Pointer, MD [facility] None  02/24/2016 9:00 AM Evans Lance, MD [facility]   Primary Care Physician: Malachi Carl, MD Location: [facility] Note by: Kathlen Brunswick. Dossie Arbour, M.D, DABA, DABAPM, DABPM, DABIPP, FIPP  Pain Score Disclaimer: We use the NRS-11 scale. This is a self-reported, subjective measurement of pain severity with only modest accuracy. It is used primarily to identify changes within a particular patient. It must be understood that outpatient pain scales are significantly less accurate that those used for research, where they can be applied under ideal controlled circumstances with minimal exposure to variables. In reality, the score is likely to be a combination of pain intensity and pain affect, where pain affect describes the degree of emotional arousal or changes in action readiness caused by the sensory experience of pain. Factors such as social and work situation, setting, emotional state, anxiety levels, expectation, and prior pain experience may influence pain perception and show large inter-individual differences that may also be affected by time variables.  Patient instructions provided during this appointment: There are no Patient Instructions on file for this visit.

## 2015-12-01 NOTE — Progress Notes (Signed)
Safety precautions to be maintained throughout the outpatient stay will include: orient to surroundings, keep bed in low position, maintain call bell within reach at all times, provide assistance with transfer out of bed and ambulation.   Patient having Left hip surgery Dec 22, 2015 discussed about how pt can take paim meds form surgeon - Paper signed and given  Pills remaining Oxycodone 5mg  3/150 Last filled 11/01/15

## 2015-12-08 ENCOUNTER — Encounter
Admission: RE | Admit: 2015-12-08 | Discharge: 2015-12-08 | Disposition: A | Discharge status: Home / Self Care | Payer: Medicare Other | Source: Ambulatory Visit | Attending: Orthopedic Surgery | Admitting: Orthopedic Surgery

## 2015-12-08 DIAGNOSIS — Z01818 Encounter for other preprocedural examination: Secondary | ICD-10-CM | POA: Diagnosis not present

## 2015-12-08 HISTORY — DX: Cardiac arrhythmia, unspecified: I49.9

## 2015-12-08 HISTORY — DX: Panic disorder (episodic paroxysmal anxiety): F41.0

## 2015-12-08 HISTORY — DX: Gout, unspecified: M10.9

## 2015-12-08 LAB — COMPREHENSIVE METABOLIC PANEL
ALBUMIN: 4.2 g/dL (ref 3.5–5.0)
ALK PHOS: 50 U/L (ref 38–126)
ALT: 19 U/L (ref 14–54)
ANION GAP: 11 (ref 5–15)
AST: 29 U/L (ref 15–41)
BUN: 23 mg/dL — ABNORMAL HIGH (ref 6–20)
CALCIUM: 10 mg/dL (ref 8.9–10.3)
CHLORIDE: 102 mmol/L (ref 101–111)
CO2: 26 mmol/L (ref 22–32)
Creatinine, Ser: 1.2 mg/dL — ABNORMAL HIGH (ref 0.44–1.00)
GFR calc non Af Amer: 43 mL/min — ABNORMAL LOW (ref >60)
GFR, EST AFRICAN AMERICAN: 50 mL/min — AB (ref >60)
GLUCOSE: 99 mg/dL (ref 65–99)
Potassium: 3.7 mmol/L (ref 3.5–5.1)
SODIUM: 139 mmol/L (ref 135–145)
Total Bilirubin: 0.7 mg/dL (ref 0.3–1.2)
Total Protein: 7.9 g/dL (ref 6.5–8.1)

## 2015-12-08 LAB — URINALYSIS COMPLETE WITH MICROSCOPIC (ARMC ONLY)
BILIRUBIN URINE: NEGATIVE
Bacteria, UA: NONE SEEN
GLUCOSE, UA: NEGATIVE mg/dL
Hgb urine dipstick: NEGATIVE
KETONES UR: NEGATIVE mg/dL
Nitrite: NEGATIVE
PH: 5 (ref 5.0–8.0)
Protein, ur: NEGATIVE mg/dL
Specific Gravity, Urine: 1.015 (ref 1.005–1.030)

## 2015-12-08 LAB — CBC WITH DIFFERENTIAL/PLATELET
BASOS PCT: 1 %
Basophils Absolute: 0 10*3/uL (ref 0–0.1)
EOS ABS: 0.2 10*3/uL (ref 0–0.7)
EOS PCT: 3 %
HCT: 44.3 % (ref 35.0–47.0)
HEMOGLOBIN: 14.9 g/dL (ref 12.0–16.0)
Lymphocytes Relative: 29 %
Lymphs Abs: 1.9 10*3/uL (ref 1.0–3.6)
MCH: 30.6 pg (ref 26.0–34.0)
MCHC: 33.6 g/dL (ref 32.0–36.0)
MCV: 90.8 fL (ref 80.0–100.0)
MONOS PCT: 11 %
Monocytes Absolute: 0.7 10*3/uL (ref 0.2–0.9)
NEUTROS PCT: 56 %
Neutro Abs: 3.8 10*3/uL (ref 1.4–6.5)
PLATELETS: 150 10*3/uL (ref 150–440)
RBC: 4.88 MIL/uL (ref 3.80–5.20)
RDW: 14.6 % — ABNORMAL HIGH (ref 11.5–14.5)
WBC: 6.6 10*3/uL (ref 3.6–11.0)

## 2015-12-08 LAB — TYPE AND SCREEN
ABO/RH(D): O POS
ANTIBODY SCREEN: NEGATIVE

## 2015-12-08 LAB — SURGICAL PCR SCREEN
MRSA, PCR: POSITIVE — AB
STAPHYLOCOCCUS AUREUS: POSITIVE — AB

## 2015-12-08 LAB — SEDIMENTATION RATE: Sed Rate: 28 mm/hr (ref 0–30)

## 2015-12-08 LAB — PROTIME-INR
INR: 1.77
Prothrombin Time: 20.8 seconds — ABNORMAL HIGH (ref 11.4–15.2)

## 2015-12-08 LAB — APTT: aPTT: 36 seconds (ref 24–36)

## 2015-12-08 NOTE — Patient Instructions (Signed)
  Your procedure is scheduled on: 12/22/15 Report to Day Surgery. [facility]OND FLOOR To find out your arrival time please call [phone] between 1PM - 3PM on 12/21/15.  Remember: Instructions that are not followed completely may result in serious medical risk, up to and including death, or upon the discretion of your surgeon and anesthesiologist your surgery may need to be rescheduled.    __X__ 1. Do not eat food or drink liquids after midnight. No gum chewing or hard candies.     ____ 2. No Alcohol for 24 hours before or after surgery.   ____ 3. Do Not Smoke For 24 Hours Prior to Your Surgery.   ____ 4. Bring all medications with you on the day of surgery if instructed.    _X___ 5. Notify your doctor if there is any change in your medical condition     (cold, fever, infections).       Do not wear jewelry, make-up, hairpins, clips or nail polish.  Do not wear lotions, powders, or perfumes. You may wear deodorant.  Do not shave 48 hours prior to surgery. Men may shave face and neck.  Do not bring valuables to the hospital.    [facility] is not responsible for any belongings or valuables.               Contacts, dentures or bridgework may not be worn into surgery.  Leave your suitcase in the car. After surgery it may be brought to your room.  For patients admitted to the hospital, discharge time is determined by your                treatment team.   Patients discharged the day of surgery will not be allowed to drive home.   Please read over the following fact sheets that you were given:   MRSA Information/ LIVING WILL PACKET   _X___ Take these medicines the morning of surgery with A SIP OF WATER:    1. METOPROLOL  2. T3 AND T4  3. VALIUM IF NEEDED  4. LOPRESSOR  5.PAIN MEDICATION IF NEEDED  6.  ____ Fleet Enema (as directed)   __X__ Use CHG Soap as directed  ____ Use inhalers on the day of surgery  ____ Stop metformin 2 days prior to surgery    ____ Take  1/2 of usual insulin dose the night before surgery and none on the morning of surgery.   __X__ Stop Coumadin/Plavix/aspirin on   STOP WARFARIN 5 DAYS BEFORE SURGERY ____ Stop Anti-inflammatories on    __X__ Stop supplements until after surgery.   STOP CLEANSE MORE UNTIL AFTER SURGERY  ____ Bring C-Pap to the hospital.

## 2015-12-08 NOTE — Pre-Procedure Instructions (Signed)
CLEARED BY DR Moshe Cipro PCP AND DR VOSS CARDIOLOGIST LOW RISK

## 2015-12-09 LAB — URINE CULTURE

## 2015-12-09 NOTE — Pre-Procedure Instructions (Signed)
Margaretha Sheffield RN from dr hootens office called regarding + MRSA-Vancomycin 1 gm IV ordered per Dr Marry Guan in addition to [place]

## 2015-12-21 DIAGNOSIS — I482 Chronic atrial fibrillation: Secondary | ICD-10-CM | POA: Diagnosis not present

## 2015-12-22 ENCOUNTER — Encounter: Payer: Self-pay | Admitting: Orthopedic Surgery

## 2015-12-22 ENCOUNTER — Inpatient Hospital Stay: Payer: Medicare Other | Admitting: Certified Registered Nurse Anesthetist

## 2015-12-22 ENCOUNTER — Encounter
Admission: RE | Disposition: A | Discharge status: Home Health | Payer: Self-pay | Source: Ambulatory Visit | Attending: Orthopedic Surgery

## 2015-12-22 ENCOUNTER — Inpatient Hospital Stay
Admission: RE | Admit: 2015-12-22 | Discharge: 2015-12-24 | DRG: 470 | Disposition: A | Discharge status: Home Health | Payer: Medicare Other | Source: Ambulatory Visit | Attending: Orthopedic Surgery | Admitting: Orthopedic Surgery

## 2015-12-22 ENCOUNTER — Inpatient Hospital Stay: Payer: Medicare Other

## 2015-12-22 DIAGNOSIS — Z471 Aftercare following joint replacement surgery: Secondary | ICD-10-CM | POA: Diagnosis not present

## 2015-12-22 DIAGNOSIS — G894 Chronic pain syndrome: Secondary | ICD-10-CM

## 2015-12-22 DIAGNOSIS — R262 Difficulty in walking, not elsewhere classified: Secondary | ICD-10-CM

## 2015-12-22 DIAGNOSIS — I480 Paroxysmal atrial fibrillation: Secondary | ICD-10-CM | POA: Diagnosis present

## 2015-12-22 DIAGNOSIS — Z6841 Body Mass Index (BMI) 40.0 and over, adult: Secondary | ICD-10-CM

## 2015-12-22 DIAGNOSIS — Z7901 Long term (current) use of anticoagulants: Secondary | ICD-10-CM

## 2015-12-22 DIAGNOSIS — E039 Hypothyroidism, unspecified: Secondary | ICD-10-CM | POA: Diagnosis present

## 2015-12-22 DIAGNOSIS — Z96642 Presence of left artificial hip joint: Secondary | ICD-10-CM | POA: Diagnosis not present

## 2015-12-22 DIAGNOSIS — M25552 Pain in left hip: Secondary | ICD-10-CM | POA: Diagnosis not present

## 2015-12-22 DIAGNOSIS — E662 Morbid (severe) obesity with alveolar hypoventilation: Secondary | ICD-10-CM | POA: Diagnosis present

## 2015-12-22 DIAGNOSIS — M8700 Idiopathic aseptic necrosis of unspecified bone: Secondary | ICD-10-CM | POA: Diagnosis not present

## 2015-12-22 DIAGNOSIS — J45909 Unspecified asthma, uncomplicated: Secondary | ICD-10-CM | POA: Diagnosis not present

## 2015-12-22 DIAGNOSIS — I10 Essential (primary) hypertension: Secondary | ICD-10-CM | POA: Diagnosis present

## 2015-12-22 DIAGNOSIS — M1612 Unilateral primary osteoarthritis, left hip: Secondary | ICD-10-CM | POA: Diagnosis not present

## 2015-12-22 DIAGNOSIS — Z950 Presence of cardiac pacemaker: Secondary | ICD-10-CM | POA: Diagnosis not present

## 2015-12-22 DIAGNOSIS — Z91041 Radiographic dye allergy status: Secondary | ICD-10-CM

## 2015-12-22 DIAGNOSIS — M6281 Muscle weakness (generalized): Secondary | ICD-10-CM

## 2015-12-22 DIAGNOSIS — Z8249 Family history of ischemic heart disease and other diseases of the circulatory system: Secondary | ICD-10-CM

## 2015-12-22 DIAGNOSIS — K219 Gastro-esophageal reflux disease without esophagitis: Secondary | ICD-10-CM | POA: Diagnosis present

## 2015-12-22 DIAGNOSIS — F4310 Post-traumatic stress disorder, unspecified: Secondary | ICD-10-CM | POA: Diagnosis present

## 2015-12-22 DIAGNOSIS — M87052 Idiopathic aseptic necrosis of left femur: Secondary | ICD-10-CM | POA: Diagnosis not present

## 2015-12-22 DIAGNOSIS — G4730 Sleep apnea, unspecified: Secondary | ICD-10-CM | POA: Diagnosis not present

## 2015-12-22 DIAGNOSIS — E785 Hyperlipidemia, unspecified: Secondary | ICD-10-CM | POA: Diagnosis present

## 2015-12-22 DIAGNOSIS — Z96649 Presence of unspecified artificial hip joint: Secondary | ICD-10-CM

## 2015-12-22 HISTORY — PX: TOTAL HIP ARTHROPLASTY: SHX124

## 2015-12-22 LAB — PROTIME-INR
INR: 1.09
PROTHROMBIN TIME: 14.1 s (ref 11.4–15.2)

## 2015-12-22 LAB — ABO/RH: ABO/RH(D): O POS

## 2015-12-22 SURGERY — ARTHROPLASTY, HIP, TOTAL,POSTERIOR APPROACH
Anesthesia: Spinal | Site: Hip | Laterality: Left | Wound class: Clean

## 2015-12-22 MED ORDER — ONDANSETRON HCL 4 MG/2ML IJ SOLN: 4.0000 mg | Freq: Once | INTRAMUSCULAR | Status: DC | PRN

## 2015-12-22 MED ORDER — TETRACAINE HCL 1 % IJ SOLN
INTRAMUSCULAR | Status: DC | PRN
  Administered 2015-12-22: 3 mg via INTRASPINAL

## 2015-12-22 MED ORDER — LIOTHYRONINE SODIUM 5 MCG PO TABS
25.0000 ug | ORAL_TABLET | Freq: Two times a day (BID) | ORAL | Status: DC
  Administered 2015-12-22 – 2015-12-24 (×4): 25 ug via ORAL
  Filled 2015-12-22 (×4): qty 5

## 2015-12-22 MED ORDER — DIPHENHYDRAMINE HCL 12.5 MG/5ML PO ELIX: 12.5000 mg | ORAL_SOLUTION | ORAL | Status: DC | PRN

## 2015-12-22 MED ORDER — LEVOTHYROXINE SODIUM 50 MCG PO TABS
25.0000 ug | ORAL_TABLET | Freq: Every day | ORAL | Status: DC
  Filled 2015-12-22: qty 1

## 2015-12-22 MED ORDER — ACETAMINOPHEN 325 MG PO TABS: 650.0000 mg | ORAL_TABLET | Freq: Four times a day (QID) | ORAL | Status: DC | PRN

## 2015-12-22 MED ORDER — ACETAMINOPHEN 10 MG/ML IV SOLN
1000.0000 mg | Freq: Four times a day (QID) | INTRAVENOUS | Status: AC
  Administered 2015-12-22 – 2015-12-23 (×4): 1000 mg via INTRAVENOUS
  Filled 2015-12-22 (×4): qty 100

## 2015-12-22 MED ORDER — TRANEXAMIC ACID 1000 MG/10ML IV SOLN
1000.0000 mg | INTRAVENOUS | Status: DC
  Filled 2015-12-22: qty 10

## 2015-12-22 MED ORDER — SODIUM CHLORIDE 0.9 % IJ SOLN
INTRAMUSCULAR | Status: AC
  Filled 2015-12-22: qty 10

## 2015-12-22 MED ORDER — TETRACAINE HCL 1 % IJ SOLN
INTRAMUSCULAR | Status: AC
  Filled 2015-12-22: qty 2

## 2015-12-22 MED ORDER — CEFAZOLIN SODIUM-DEXTROSE 2-4 GM/100ML-% IV SOLN
2.0000 g | Freq: Four times a day (QID) | INTRAVENOUS | Status: AC
Start: 2015-12-22 — End: 2015-12-23
  Administered 2015-12-22 – 2015-12-23 (×4): 2 g via INTRAVENOUS
  Filled 2015-12-22 (×4): qty 100

## 2015-12-22 MED ORDER — EPHEDRINE SULFATE 50 MG/ML IJ SOLN
INTRAMUSCULAR | Status: AC
  Administered 2015-12-22: 15 mg via INTRAVENOUS
  Filled 2015-12-22: qty 1

## 2015-12-22 MED ORDER — CHLORHEXIDINE GLUCONATE 4 % EX LIQD: 60.0000 mL | Freq: Once | CUTANEOUS | Status: DC

## 2015-12-22 MED ORDER — ONDANSETRON HCL 4 MG/2ML IJ SOLN
INTRAMUSCULAR | Status: DC | PRN
  Administered 2015-12-22: 4 mg via INTRAVENOUS

## 2015-12-22 MED ORDER — LACTATED RINGERS IV BOLUS (SEPSIS)
500.0000 mL | Freq: Once | INTRAVENOUS | Status: AC
  Administered 2015-12-22: 500 mL via INTRAVENOUS

## 2015-12-22 MED ORDER — FENTANYL CITRATE (PF) 100 MCG/2ML IJ SOLN
25.0000 ug | INTRAMUSCULAR | Status: DC | PRN
  Administered 2015-12-22 (×4): 25 ug via INTRAVENOUS

## 2015-12-22 MED ORDER — MAGNESIUM HYDROXIDE 400 MG/5ML PO SUSP
30.0000 mL | Freq: Every day | ORAL | Status: DC | PRN
  Administered 2015-12-23: 30 mL via ORAL
  Filled 2015-12-22: qty 30

## 2015-12-22 MED ORDER — PROPOFOL 500 MG/50ML IV EMUL
INTRAVENOUS | Status: DC | PRN
  Administered 2015-12-22: 65 ug/kg/min via INTRAVENOUS

## 2015-12-22 MED ORDER — ONDANSETRON HCL 4 MG/2ML IJ SOLN: 4.0000 mg | Freq: Four times a day (QID) | INTRAMUSCULAR | Status: DC | PRN

## 2015-12-22 MED ORDER — ALUM & MAG HYDROXIDE-SIMETH 200-200-20 MG/5ML PO SUSP: 30.0000 mL | ORAL | Status: DC | PRN

## 2015-12-22 MED ORDER — BUPIVACAINE HCL (PF) 0.5 % IJ SOLN
INTRAMUSCULAR | Status: DC | PRN
  Administered 2015-12-22: 2.5 mL

## 2015-12-22 MED ORDER — NEOMYCIN-POLYMYXIN B GU 40-200000 IR SOLN
Status: DC | PRN
  Administered 2015-12-22: 16 mL

## 2015-12-22 MED ORDER — SODIUM CHLORIDE 0.9 % IV SOLN
INTRAVENOUS | Status: DC | PRN
  Administered 2015-12-22: 30 ug/min via INTRAVENOUS

## 2015-12-22 MED ORDER — TRIAMTERENE-HCTZ 75-50 MG PO TABS
1.0000 | ORAL_TABLET | Freq: Every day | ORAL | Status: DC
  Administered 2015-12-23: 1 via ORAL
  Filled 2015-12-22 (×2): qty 1

## 2015-12-22 MED ORDER — METOCLOPRAMIDE HCL 10 MG PO TABS
10.0000 mg | ORAL_TABLET | Freq: Three times a day (TID) | ORAL | Status: DC
Start: 2015-12-22 — End: 2015-12-24
  Administered 2015-12-22 – 2015-12-24 (×6): 10 mg via ORAL
  Filled 2015-12-22 (×6): qty 1

## 2015-12-22 MED ORDER — PHENYLEPHRINE HCL 10 MG/ML IJ SOLN
INTRAMUSCULAR | Status: DC | PRN
  Administered 2015-12-22 (×4): 200 ug via INTRAVENOUS
  Administered 2015-12-22 (×4): 100 ug via INTRAVENOUS
  Administered 2015-12-22: 200 ug via INTRAVENOUS

## 2015-12-22 MED ORDER — NEOMYCIN-POLYMYXIN B GU 40-200000 IR SOLN
Status: AC
  Filled 2015-12-22: qty 20

## 2015-12-22 MED ORDER — DIAZEPAM 5 MG PO TABS
10.0000 mg | ORAL_TABLET | Freq: Two times a day (BID) | ORAL | Status: DC | PRN
  Administered 2015-12-23: 10 mg via ORAL
  Filled 2015-12-22: qty 2

## 2015-12-22 MED ORDER — PHENOL 1.4 % MT LIQD
1.0000 | OROMUCOSAL | Status: DC | PRN
Start: 2015-12-22 — End: 2015-12-24
  Filled 2015-12-22: qty 177

## 2015-12-22 MED ORDER — ONDANSETRON HCL 4 MG PO TABS
4.0000 mg | ORAL_TABLET | Freq: Four times a day (QID) | ORAL | Status: DC | PRN
  Administered 2015-12-23: 4 mg via ORAL
  Filled 2015-12-22: qty 1

## 2015-12-22 MED ORDER — EPHEDRINE SULFATE 50 MG/ML IJ SOLN
15.0000 mg | Freq: Once | INTRAMUSCULAR | Status: AC
  Administered 2015-12-22: 15 mg via INTRAVENOUS

## 2015-12-22 MED ORDER — FAMOTIDINE 20 MG PO TABS
20.0000 mg | ORAL_TABLET | Freq: Once | ORAL | Status: DC
  Administered 2015-12-22: 20 mg via ORAL

## 2015-12-22 MED ORDER — TRANEXAMIC ACID 1000 MG/10ML IV SOLN
INTRAVENOUS | Status: DC | PRN
  Administered 2015-12-22: 1000 mg via INTRAVENOUS

## 2015-12-22 MED ORDER — TRAMADOL HCL 50 MG PO TABS
50.0000 mg | ORAL_TABLET | ORAL | Status: DC | PRN
  Administered 2015-12-22 – 2015-12-23 (×3): 100 mg via ORAL
  Filled 2015-12-22 (×4): qty 2

## 2015-12-22 MED ORDER — CEFAZOLIN SODIUM-DEXTROSE 2-4 GM/100ML-% IV SOLN
INTRAVENOUS | Status: AC
  Filled 2015-12-22: qty 100

## 2015-12-22 MED ORDER — MIDAZOLAM HCL 5 MG/5ML IJ SOLN
INTRAMUSCULAR | Status: DC | PRN
  Administered 2015-12-22: 2 mg via INTRAVENOUS

## 2015-12-22 MED ORDER — CEFAZOLIN SODIUM-DEXTROSE 2-4 GM/100ML-% IV SOLN: 2.0000 g | INTRAVENOUS | Status: DC

## 2015-12-22 MED ORDER — FLEET ENEMA 7-19 GM/118ML RE ENEM: 1.0000 | ENEMA | Freq: Once | RECTAL | Status: DC | PRN

## 2015-12-22 MED ORDER — SENNOSIDES-DOCUSATE SODIUM 8.6-50 MG PO TABS
1.0000 | ORAL_TABLET | Freq: Two times a day (BID) | ORAL | Status: DC
  Administered 2015-12-22 – 2015-12-24 (×4): 1 via ORAL
  Filled 2015-12-22 (×4): qty 1

## 2015-12-22 MED ORDER — SODIUM CHLORIDE 0.9 % IV SOLN
INTRAVENOUS | Status: DC
  Administered 2015-12-22 – 2015-12-23 (×2): via INTRAVENOUS

## 2015-12-22 MED ORDER — METOPROLOL TARTRATE 50 MG PO TABS
75.0000 mg | ORAL_TABLET | Freq: Two times a day (BID) | ORAL | Status: DC
  Administered 2015-12-23 – 2015-12-24 (×2): 75 mg via ORAL
  Filled 2015-12-22 (×5): qty 1

## 2015-12-22 MED ORDER — WARFARIN - PHYSICIAN DOSING INPATIENT
Freq: Every day | Status: DC
  Administered 2015-12-23: 18:00:00

## 2015-12-22 MED ORDER — TRANEXAMIC ACID 1000 MG/10ML IV SOLN
1000.0000 mg | Freq: Once | INTRAVENOUS | Status: AC
  Administered 2015-12-22: 1000 mg via INTRAVENOUS
  Filled 2015-12-22: qty 10

## 2015-12-22 MED ORDER — VANCOMYCIN HCL IN DEXTROSE 1-5 GM/200ML-% IV SOLN
INTRAVENOUS | Status: AC
  Administered 2015-12-22: 1000 mg via INTRAVENOUS
  Filled 2015-12-22: qty 200

## 2015-12-22 MED ORDER — OXYCODONE HCL 5 MG PO TABS
5.0000 mg | ORAL_TABLET | ORAL | Status: DC | PRN
  Administered 2015-12-22: 10 mg via ORAL
  Administered 2015-12-22: 5 mg via ORAL
  Administered 2015-12-23 – 2015-12-24 (×4): 10 mg via ORAL
  Filled 2015-12-22: qty 1
  Filled 2015-12-22 (×5): qty 2

## 2015-12-22 MED ORDER — BISACODYL 10 MG RE SUPP: 10.0000 mg | Freq: Every day | RECTAL | Status: DC | PRN

## 2015-12-22 MED ORDER — 5-HTP 100 MG PO CAPS: 100.0000 mg | ORAL_CAPSULE | Freq: Four times a day (QID) | ORAL | Status: DC

## 2015-12-22 MED ORDER — ACETAMINOPHEN 650 MG RE SUPP: 650.0000 mg | Freq: Four times a day (QID) | RECTAL | Status: DC | PRN

## 2015-12-22 MED ORDER — MORPHINE SULFATE (PF) 2 MG/ML IV SOLN
2.0000 mg | INTRAVENOUS | Status: DC | PRN
  Filled 2015-12-22: qty 1

## 2015-12-22 MED ORDER — FAMOTIDINE 20 MG PO TABS
ORAL_TABLET | ORAL | Status: AC
  Administered 2015-12-22: 20 mg via ORAL
  Filled 2015-12-22: qty 1

## 2015-12-22 MED ORDER — FENTANYL CITRATE (PF) 100 MCG/2ML IJ SOLN
INTRAMUSCULAR | Status: AC
  Administered 2015-12-22: 25 ug via INTRAVENOUS
  Filled 2015-12-22: qty 2

## 2015-12-22 MED ORDER — LACTATED RINGERS IV SOLN
INTRAVENOUS | Status: DC
  Administered 2015-12-22: 11:00:00 via INTRAVENOUS

## 2015-12-22 MED ORDER — FENTANYL CITRATE (PF) 100 MCG/2ML IJ SOLN
INTRAMUSCULAR | Status: DC | PRN
  Administered 2015-12-22 (×2): 50 ug via INTRAVENOUS

## 2015-12-22 MED ORDER — CEFAZOLIN SODIUM-DEXTROSE 2-4 GM/100ML-% IV SOLN
2.0000 g | Freq: Once | INTRAVENOUS | Status: AC
  Administered 2015-12-22: 2 g via INTRAVENOUS

## 2015-12-22 MED ORDER — WARFARIN SODIUM 5 MG PO TABS
5.0000 mg | ORAL_TABLET | Freq: Every day | ORAL | Status: DC
  Administered 2015-12-22 – 2015-12-24 (×4): 5 mg via ORAL
  Filled 2015-12-22 (×4): qty 1

## 2015-12-22 MED ORDER — LACTATED RINGERS IV SOLN
INTRAVENOUS | Status: DC | PRN
  Administered 2015-12-22: 12:00:00 via INTRAVENOUS

## 2015-12-22 MED ORDER — MENTHOL 3 MG MT LOZG
1.0000 | LOZENGE | OROMUCOSAL | Status: DC | PRN
  Filled 2015-12-22: qty 9

## 2015-12-22 MED ORDER — ACETAMINOPHEN 10 MG/ML IV SOLN
INTRAVENOUS | Status: DC | PRN
  Administered 2015-12-22: 1000 mg via INTRAVENOUS

## 2015-12-22 MED ORDER — PANTOPRAZOLE SODIUM 40 MG PO TBEC
40.0000 mg | DELAYED_RELEASE_TABLET | Freq: Two times a day (BID) | ORAL | Status: DC
  Administered 2015-12-22 – 2015-12-24 (×4): 40 mg via ORAL
  Filled 2015-12-22 (×4): qty 1

## 2015-12-22 MED ORDER — POTASSIUM CHLORIDE CRYS ER 10 MEQ PO TBCR: 10.0000 meq | EXTENDED_RELEASE_TABLET | Freq: Two times a day (BID) | ORAL | Status: DC

## 2015-12-22 MED ORDER — VANCOMYCIN HCL IN DEXTROSE 1-5 GM/200ML-% IV SOLN
1000.0000 mg | Freq: Once | INTRAVENOUS | Status: AC
  Administered 2015-12-22: 1000 mg via INTRAVENOUS

## 2015-12-22 MED ORDER — COLCHICINE 0.6 MG PO TABS: 0.6000 mg | ORAL_TABLET | Freq: Every day | ORAL | Status: DC | PRN

## 2015-12-22 MED ORDER — FERROUS SULFATE 325 (65 FE) MG PO TABS
325.0000 mg | ORAL_TABLET | Freq: Two times a day (BID) | ORAL | Status: DC
  Administered 2015-12-22 – 2015-12-24 (×4): 325 mg via ORAL
  Filled 2015-12-22 (×4): qty 1

## 2015-12-22 SURGICAL SUPPLY — 51 items
BLADE DRUM FLTD (BLADE) ×3 IMPLANT
BLADE SAW 1 (BLADE) ×3 IMPLANT
CANISTER SUCT 1200ML W/VALVE (MISCELLANEOUS) ×3 IMPLANT
CANISTER SUCT 3000ML (MISCELLANEOUS) ×6 IMPLANT
CAPT HIP TOTAL 2 ×3 IMPLANT
CARTRIDGE OIL MAESTRO DRILL (MISCELLANEOUS) ×1 IMPLANT
CATH FOL LEG HOLDER (MISCELLANEOUS) ×3 IMPLANT
CATH TRAY METER 16FR LF (MISCELLANEOUS) ×3 IMPLANT
DIFFUSER MAESTRO (MISCELLANEOUS) ×3 IMPLANT
DRAPE INCISE IOBAN 66X60 STRL (DRAPES) ×3 IMPLANT
DRAPE SHEET LG 3/4 BI-LAMINATE (DRAPES) ×3 IMPLANT
DRSG DERMACEA 8X12 NADH (GAUZE/BANDAGES/DRESSINGS) ×3 IMPLANT
DRSG OPSITE POSTOP 3X4 (GAUZE/BANDAGES/DRESSINGS) ×3 IMPLANT
DRSG OPSITE POSTOP 4X12 (GAUZE/BANDAGES/DRESSINGS) ×3 IMPLANT
DRSG OPSITE POSTOP 4X14 (GAUZE/BANDAGES/DRESSINGS) ×3 IMPLANT
DRSG TEGADERM 4X4.75 (GAUZE/BANDAGES/DRESSINGS) ×3 IMPLANT
DURAPREP 26ML APPLICATOR (WOUND CARE) ×3 IMPLANT
ELECT BLADE 6.5 EXT (BLADE) ×3 IMPLANT
ELECT CAUTERY BLADE 6.4 (BLADE) ×3 IMPLANT
GLOVE BIOGEL M STRL SZ7.5 (GLOVE) ×6 IMPLANT
GLOVE INDICATOR 8.0 STRL GRN (GLOVE) ×3 IMPLANT
GLOVE SURG 9.0 ORTHO LTXF (GLOVE) ×3 IMPLANT
GLOVE SURG ORTHO 9.0 STRL STRW (GLOVE) ×3 IMPLANT
GOWN STRL REUS W/ TWL LRG LVL3 (GOWN DISPOSABLE) ×2 IMPLANT
GOWN STRL REUS W/TWL 2XL LVL3 (GOWN DISPOSABLE) ×3 IMPLANT
GOWN STRL REUS W/TWL LRG LVL3 (GOWN DISPOSABLE) ×6
HANDPIECE INTERPULSE COAX TIP (DISPOSABLE) ×2
HEMOVAC 400CC 10FR (MISCELLANEOUS) ×3 IMPLANT
HOOD PEEL AWAY FLYTE STAYCOOL (MISCELLANEOUS) ×6 IMPLANT
KIT RM TURNOVER STRD PROC AR (KITS) ×3 IMPLANT
NDL SAFETY 18GX1.5 (NEEDLE) ×3 IMPLANT
NS IRRIG 500ML POUR BTL (IV SOLUTION) ×3 IMPLANT
OIL CARTRIDGE MAESTRO DRILL (MISCELLANEOUS) ×3
PACK HIP PROSTHESIS (MISCELLANEOUS) ×3 IMPLANT
SET HNDPC FAN SPRY TIP SCT (DISPOSABLE) ×1 IMPLANT
SOL .9 NS 3000ML IRR  AL (IV SOLUTION) ×2
SOL .9 NS 3000ML IRR AL (IV SOLUTION) ×1
SOL .9 NS 3000ML IRR UROMATIC (IV SOLUTION) ×1 IMPLANT
SOL PREP PVP 2OZ (MISCELLANEOUS) ×3
SOLUTION PREP PVP 2OZ (MISCELLANEOUS) ×1 IMPLANT
SPONGE DRAIN TRACH 4X4 STRL 2S (GAUZE/BANDAGES/DRESSINGS) ×3 IMPLANT
STAPLER SKIN PROX 35W (STAPLE) ×3 IMPLANT
SUT ETHIBOND #5 BRAIDED 30INL (SUTURE) ×3 IMPLANT
SUT VIC AB 0 CT1 36 (SUTURE) ×3 IMPLANT
SUT VIC AB 1 CT1 36 (SUTURE) ×6 IMPLANT
SUT VIC AB 2-0 CT1 27 (SUTURE) ×3
SUT VIC AB 2-0 CT1 TAPERPNT 27 (SUTURE) ×1 IMPLANT
SYR 20CC LL (SYRINGE) ×3 IMPLANT
TAPE ADH 3 LX (MISCELLANEOUS) ×3 IMPLANT
TAPE TRANSPORE STRL 2 31045 (GAUZE/BANDAGES/DRESSINGS) ×3 IMPLANT
TOWEL OR 17X26 4PK STRL BLUE (TOWEL DISPOSABLE) ×3 IMPLANT

## 2015-12-22 NOTE — Progress Notes (Signed)
PHARMACIST - PHYSICIAN ORDER COMMUNICATION  CONCERNING: P&T Medication Policy on Herbal Medications  DESCRIPTION:  This patient's order for:  5-HTP  has been noted.  This product(s) is classified as an "herbal" or natural product. Due to a lack of definitive safety studies or FDA approval, nonstandard manufacturing practices, plus the potential risk of unknown drug-drug interactions while on inpatient medications, the Pharmacy and Therapeutics Committee does not permit the use of "herbal" or natural products of this type within [facility].   ACTION TAKEN: The pharmacy department is unable to verify this order at this time and your patient has been informed of this safety policy. Please reevaluate patient's clinical condition at discharge and address if the herbal or natural product(s) should be resumed at that time.  Lenis Noon, PharmD 12/22/15 6:45 PM

## 2015-12-22 NOTE — Transfer of Care (Signed)
Immediate Anesthesia Transfer of Care Note  Patient: [name]  Procedure(s) Performed: Procedure(s): TOTAL HIP ARTHROPLASTY (Left)  Patient Location: PACU  Anesthesia Type:Spinal  Level of Consciousness: sedated  Airway & Oxygen Therapy: Patient Spontanous Breathing and Patient connected to face mask oxygen  Post-op Assessment: Report given to RN and Post -op Vital signs reviewed and stable  Post vital signs: Reviewed and stable  Last Vitals:  Vitals:   12/22/15 1036  BP: (!) 145/76  Pulse: 70  Resp: 16    Last Pain:  Vitals:   12/22/15 1036  PainSc: 4          Complications: No apparent anesthesia complications

## 2015-12-22 NOTE — Anesthesia Preprocedure Evaluation (Addendum)
Anesthesia Evaluation  Patient identified by MRN, date of birth, ID band Patient awake    Reviewed: Allergy & Precautions, H&P , NPO status , Patient's Chart, lab work & pertinent test results, reviewed documented beta blocker date and time   Airway Mallampati: II  TM Distance: >3 FB Neck ROM: full    Dental no notable dental hx.    Pulmonary asthma , sleep apnea ,    Pulmonary exam normal        Cardiovascular Exercise Tolerance: Good hypertension, Pt. on home beta blockers and Pt. on medications negative cardio ROS Normal cardiovascular exam+ dysrhythmias Atrial Fibrillation + pacemaker      Neuro/Psych PSYCHIATRIC DISORDERS Anxiety negative neurological ROS     GI/Hepatic negative GI ROS, Neg liver ROS, GERD  Medicated and Controlled,  Endo/Other  Hypothyroidism Morbid obesity  Renal/GU negative Renal ROS  negative genitourinary   Musculoskeletal  (+) Arthritis , Osteoarthritis,    Abdominal (+) + obese,   Peds negative pediatric ROS (+)  Hematology negative hematology ROS (+)   Anesthesia Other Findings Past Medical History: No date: Anxiety No date: Aortic insufficiency     Comment: Trivial No date: Arthritis No date: Asthma No date: Bronchitis No date: Bursitis, hip No date: Claustrophobia No date: Claustrophobia No date: Dysrhythmia     Comment: a fib No date: GERD (gastroesophageal reflux disease)     Comment: history of No date: Gout No date: Hyperlipidemia No date: Hypertension No date: Hypothyroidism No date: Mitral regurgitation No date: Obesity No date: Panic attacks No date: Paroxysmal atrial fibrillation (HCC) No date: PTSD (post-traumatic stress disorder) No date: Rhinitis No date: Sick sinus syndrome (HCC) No date: Sleep apnea     Comment: was ordered CPAP, but never fitted for one               years ago, Dr has retired  Reproductive/Obstetrics negative OB ROS                             Anesthesia Physical  Anesthesia Plan  ASA: III  Anesthesia Plan: Spinal   Post-op Pain Management:    Induction: Intravenous  Airway Management Planned: Nasal Cannula  Additional Equipment:   Intra-op Plan:   Post-operative Plan:   Informed Consent: I have reviewed the patients History and Physical, chart, labs and discussed the procedure including the risks, benefits and alternatives for the proposed anesthesia with the patient or authorized representative who has indicated his/her understanding and acceptance.     Plan Discussed with: CRNA and Surgeon  Anesthesia Plan Comments:        Anesthesia Quick Evaluation

## 2015-12-22 NOTE — H&P (Signed)
The patient has been re-examined, and the chart reviewed, and there have been no interval changes to the documented history and physical.    The risks, benefits, and alternatives have been discussed at length. The patient expressed understanding of the risks benefits and agreed with plans for surgical intervention.  James P. Hooten, Jr. M.D.    

## 2015-12-22 NOTE — Progress Notes (Signed)
Pt tolerated sitting on side of bed. No complaints

## 2015-12-22 NOTE — Anesthesia Procedure Notes (Signed)
Spinal  Patient location during procedure: OR Staffing Anesthesiologist: Alvin Critchley Resident/CRNA: Rolla Plate Performed: resident/CRNA  Preanesthetic Checklist Completed: patient identified, site marked, surgical consent, pre-op evaluation, timeout performed, IV checked, risks and benefits discussed and monitors and equipment checked Spinal Block Patient position: sitting Prep: ChloraPrep and site prepped and draped Patient monitoring: heart rate, continuous pulse ox, blood pressure and cardiac monitor Approach: midline Location: L4-5 Injection technique: single-shot Needle Needle type: Whitacre and Introducer  Needle gauge: 24 G Needle length: 9 cm Additional Notes Negative paresthesia. Negative blood return. Positive free-flowing CSF. Expiration date of kit checked and confirmed. Patient tolerated procedure well, without complications.

## 2015-12-22 NOTE — Anesthesia Procedure Notes (Signed)
Date/Time: 12/22/2015 12:05 PM Performed by: Johnna Acosta Pre-anesthesia Checklist: Patient identified, Emergency Drugs available, Suction available, Patient being monitored and Timeout performed Patient Re-evaluated:Patient Re-evaluated prior to inductionOxygen Delivery Method: Simple face mask

## 2015-12-22 NOTE — Op Note (Signed)
OPERATIVE NOTE  DATE OF SURGERY:  12/22/2015  PATIENT NAME:  [name]   DOB: 09-Jun-1940  MRN: [id number]  PRE-OPERATIVE DIAGNOSIS: Degenerative arthrosis of the left hip, primary  POST-OPERATIVE DIAGNOSIS:  Same  PROCEDURE:  Left total hip arthroplasty  SURGEON:  Marciano Sequin. M.D.  ASSISTANT:  Vance Peper, PA (present and scrubbed throughout the case, critical for assistance with exposure, retraction, instrumentation, and closure)  ANESTHESIA: spinal  ESTIMATED BLOOD LOSS: 250 mL  FLUIDS REPLACED: 1500 mL of crystalloid  DRAINS: 2 medium drains to a Hemovac reservoir  IMPLANTS UTILIZED: DePuy 13.5 mm small stature AML femoral stem, 52 mm OD Pinnacle 100 acetabular component, +4 mm 10 Pinnacle Marathon polyethylene insert, and a 36 mm M-SPEC +5 mm hip ball  INDICATIONS FOR SURGERY: [name] is a 75 y.o. year old female with a long history of progressive hip and groin  pain. X-rays demonstrated severe degenerative changes. The patient had not seen any significant improvement despite conservative nonsurgical intervention. After discussion of the risks and benefits of surgical intervention, the patient expressed understanding of the risks benefits and agree with plans for total hip arthroplasty.   The risks, benefits, and alternatives were discussed at length including but not limited to the risks of infection, bleeding, nerve injury, stiffness, blood clots, the need for revision surgery, limb length inequality, dislocation, cardiopulmonary complications, among others, and they were willing to proceed.  PROCEDURE IN DETAIL: The patient was brought into the operating room and, after adequate spinal anesthesia was achieved, the patient was placed in a right lateral decubitus position. Axillary roll was placed and all bony prominences were well-padded. The patient's left hip was cleaned and prepped with alcohol and DuraPrep and draped in the usual sterile fashion. A "timeout"  was performed as per usual protocol. A lateral curvilinear incision was made gently curving towards the posterior superior iliac spine. The IT band was incised in line with the skin incision and the fibers of the gluteus maximus were split in line. The piriformis tendon was identified, skeletonized, and incised at its insertion to the proximal femur and reflected posteriorly. A T type posterior capsulotomy was performed. Prior to dislocation of the femoral head, a threaded Steinmann pin was inserted through a separate stab incision into the pelvis superior to the acetabulum and bent in the form of a stylus so as to assess limb length and hip offset throughout the procedure. The femoral head was then dislocated posteriorly. Inspection of the femoral head demonstrated severe degenerative changes with full-thickness loss of articular cartilage. The femoral neck cut was performed using an oscillating saw. The anterior capsule was elevated off of the femoral neck using a periosteal elevator. Attention was then directed to the acetabulum. The remnant of the labrum was excised using electrocautery. Inspection of the acetabulum also demonstrated significant degenerative changes. The acetabulum was reamed in sequential fashion up to a 51 mm diameter. Good punctate bleeding bone was encountered. A 52 mm Pinnacle 100 acetabular component was positioned and impacted into place. Good scratch fit was appreciated. A +4 mm neutral polyethylene trial was inserted.  Attention was then directed to the proximal femur. A hole for reaming of the proximal femoral canal was created using a high-speed burr. The femoral canal was reamed in sequential fashion up to a 13.5 mm diameter. This allowed for approximately 5 cm of scratch fit. Serial broaches were inserted up to a 13.5 mm small stature femoral broach. Calcar region was planed and a trial reduction  was performed using a 36 mm hip ball with a +5 mm neck length. Fair stability was  noted so was elected to trial the +4 mm 10 polyethylene liner with the high side at the 4:00 position. Good equalization of limb lengths and hip offset was appreciated and excellent stability was noted both anteriorly and posteriorly. Trial components were removed. The acetabular shell was irrigated with copious amounts of normal saline with antibiotic solution and suctioned dry. A +4 mm 10 Pinnacle Marathon polyethylene insert was positioned with the high side at the 4:00 position and impacted into place. Next, a 13.5 mm small stature AML femoral stem was positioned and impacted into place. Good scratch fit was appreciated. A trial reduction was again performed with a 36 mm hip ball with a +5 mm neck length. Again, good equalization of limb lengths was appreciated and excellent stability appreciated both anteriorly and posteriorly. The hip was then dislocated and the trial hip ball was removed. The Morse taper was cleaned and dried. A 36 mm M-SPEC hip ball with a +5 mm neck length was placed on the trunnion and impacted into place. The hip was then reduced and placed through range of motion. Excellent stability was appreciated both anteriorly and posteriorly.  The wound was irrigated with copious amounts of normal saline with antibiotic solution and suctioned dry. Good hemostasis was appreciated. The posterior capsulotomy was repaired using #5 Ethibond. Piriformis tendon was reapproximated to the undersurface of the gluteus medius tendon using #5 Ethibond. Two medium drains were placed in the wound bed and brought out through separate stab incisions to be attached to a Hemovac reservoir. The IT band was reapproximated using interrupted sutures of #1 Vicryl. Subcutaneous tissue was approximated using first #0 Vicryl followed by #2-0 Vicryl. The skin was closed with skin staples.  The patient tolerated the procedure well and was transported to the recovery room in stable condition.   Marciano Sequin., M.D.

## 2015-12-22 NOTE — Brief Op Note (Signed)
12/22/2015  3:22 PM  PATIENT:  [name]  75 y.o. female  PRE-OPERATIVE DIAGNOSIS:  Degenerative arthrosis of the left hip  POST-OPERATIVE DIAGNOSIS:  Same  PROCEDURE:  Procedure(s): TOTAL HIP ARTHROPLASTY (Left)  SURGEON:  Surgeon(s) and Role:    * Dereck Leep, MD - Primary  ASSISTANTS: Vance Peper, PA   ANESTHESIA:   spinal  EBL:  Total I/O In: 1500 [I.V.:1500] Out: 500 [Urine:250; Blood:250]  BLOOD ADMINISTERED:none  DRAINS: 2 medium Hemovac drains   LOCAL MEDICATIONS USED:  NONE  SPECIMEN:  Source of Specimen:  Left femoral head  DISPOSITION OF SPECIMEN:  PATHOLOGY  COUNTS:  YES  TOURNIQUET:  * No tourniquets in log *  DICTATION: .Dragon Dictation  PLAN OF CARE: Admit to inpatient   PATIENT DISPOSITION:  PACU - hemodynamically stable.   Delay start of Pharmacological VTE agent (>24hrs) due to surgical blood loss or risk of bleeding: yes

## 2015-12-23 ENCOUNTER — Encounter: Payer: Self-pay | Admitting: Orthopedic Surgery

## 2015-12-23 LAB — PROTIME-INR
INR: 1.18
Prothrombin Time: 15.1 seconds (ref 11.4–15.2)

## 2015-12-23 LAB — BASIC METABOLIC PANEL
ANION GAP: 9 (ref 5–15)
BUN: 24 mg/dL — ABNORMAL HIGH (ref 6–20)
CALCIUM: 8.5 mg/dL — AB (ref 8.9–10.3)
CHLORIDE: 104 mmol/L (ref 101–111)
CO2: 23 mmol/L (ref 22–32)
Creatinine, Ser: 1.19 mg/dL — ABNORMAL HIGH (ref 0.44–1.00)
GFR calc non Af Amer: 43 mL/min — ABNORMAL LOW (ref >60)
GFR, EST AFRICAN AMERICAN: 50 mL/min — AB (ref >60)
GLUCOSE: 112 mg/dL — AB (ref 65–99)
POTASSIUM: 3.1 mmol/L — AB (ref 3.5–5.1)
Sodium: 136 mmol/L (ref 135–145)

## 2015-12-23 LAB — CBC
HCT: 36.2 % (ref 35.0–47.0)
Hemoglobin: 12.3 g/dL (ref 12.0–16.0)
MCH: 31.5 pg (ref 26.0–34.0)
MCHC: 33.9 g/dL (ref 32.0–36.0)
MCV: 92.9 fL (ref 80.0–100.0)
PLATELETS: 115 10*3/uL — AB (ref 150–440)
RBC: 3.9 MIL/uL (ref 3.80–5.20)
RDW: 14.5 % (ref 11.5–14.5)
WBC: 5.7 10*3/uL (ref 3.6–11.0)

## 2015-12-23 MED ORDER — POTASSIUM CHLORIDE CRYS ER 20 MEQ PO TBCR
20.0000 meq | EXTENDED_RELEASE_TABLET | Freq: Three times a day (TID) | ORAL | Status: AC
  Administered 2015-12-23 (×3): 20 meq via ORAL
  Filled 2015-12-23 (×3): qty 1

## 2015-12-23 NOTE — Evaluation (Signed)
Physical Therapy Evaluation Patient Details Name: [name] MRN: [id number] DOB: [birth year] Today's Date: 12/23/2015   History of Present Illness  Pt. is a 75 y.o. female who was admitted for a Left THR. Pt. has Posterior Hip precautions.  Clinical Impression  Pt shows great effort with all aspects of PT exam and has great family support.  She was unable to do SLRs and needed some assist with mobility but overall did well.  She had expected post-op pain but this was not overly limiting for her.  Overall pt was able to perform ~10 minutes of supine exercises and walk to/from door w/o excessive fatigue or other safety issue.  Pt doing well and should be able to return home if she continues to current post-op trajectory.    Follow Up Recommendations Home health PT (per continued progress)    Equipment Recommendations  Rolling walker with 5" wheels    Recommendations for Other Services       Precautions / Restrictions Precautions Precautions: Posterior Hip Precaution Booklet Issued: Yes (comment) Restrictions LLE Weight Bearing: Weight bearing as tolerated      Mobility  Bed Mobility Overal bed mobility: Needs Assistance Bed Mobility: Supine to Sit     Supine to sit: Min assist     General bed mobility comments: Pt needing light assist to get LEs to EOB, but showed good effort and no signficant increased pain  Transfers Overall transfer level: Modified independent Equipment used: Rolling walker (2 wheeled)             General transfer comment: Pt able to rise with only light cuing for hand placement and sequencing.   Ambulation/Gait Ambulation/Gait assistance: Min guard Ambulation Distance (Feet): 35 Feet Assistive device: Rolling walker (2 wheeled)       General Gait Details: Pt with slow, deliberate but consistent gait.  She was able to use the walker appropriately and though somewhat hesistant showed good WBing tolerance.  Pt with no LOBs and only minimal  fatigue.   Stairs            Wheelchair Mobility    Modified Rankin (Stroke Patients Only)       Balance Overall balance assessment: Modified Independent                                           Pertinent Vitals/Pain      Home Living Family/patient expects to be discharged to:: Private residence Living Arrangements: Spouse/significant other Available Help at Discharge: Family Type of Home: House Home Access: Stairs to enter Entrance Stairs-Rails: Left;Right (wide)     Home Equipment:  (has been borrowing a walker)      Prior Function Level of Independence: Independent with assistive device(s)   Gait / Transfers Assistance Needed: Pt apparently has been limping and needing some assist recently     Comments: Pt only getting out of the home 1x/wk for church     Hand Dominance        Extremity/Trunk Assessment   Upper Extremity Assessment: Generalized weakness;Overall WFL for tasks assessed (age appropriate limitations)           Lower Extremity Assessment: Generalized weakness;Overall WFL for tasks assessed (L LE grossly 3/5, unable to do SLRs)         Communication   Communication: No difficulties  Cognition Arousal/Alertness: Awake/alert Behavior During Therapy: WFL for tasks assessed/performed Overall  Cognitive Status: Within Functional Limits for tasks assessed                      General Comments      Exercises Total Joint Exercises Ankle Circles/Pumps: Strengthening;10 reps Quad Sets: Strengthening;10 reps Gluteal Sets: Strengthening;10 reps Short Arc Quad: AROM;Strengthening;10 reps Heel Slides: AROM;AAROM;10 reps Hip ABduction/ADduction: AROM;10 reps   Assessment/Plan    PT Assessment Patient needs continued PT services  PT Problem List Decreased strength;Decreased range of motion;Decreased activity tolerance;Decreased balance;Decreased mobility;Decreased cognition;Decreased knowledge of use of  DME;Decreased safety awareness;Decreased knowledge of precautions;Decreased coordination          PT Treatment Interventions DME instruction;Gait training;Stair training;Functional mobility training;Therapeutic activities;Therapeutic exercise;Balance training;Neuromuscular re-education;Cognitive remediation;Patient/family education    PT Goals (Current goals can be found in the Care Plan section)  Acute Rehab PT Goals Patient Stated Goal: go home PT Goal Formulation: With patient Time For Goal Achievement: 01/06/16 Potential to Achieve Goals: Fair    Frequency BID   Barriers to discharge        Co-evaluation               End of Session Equipment Utilized During Treatment: Gait belt Activity Tolerance: Patient tolerated treatment well Patient left: with chair alarm set;with call bell/phone within reach;with family/visitor present Nurse Communication: Mobility status         Time: [id number] PT Time Calculation (min) (ACUTE ONLY): 31 min   Charges:   PT Evaluation $PT Eval Low Complexity: 1 Procedure PT Treatments $Therapeutic Exercise: 8-22 mins   PT G Codes:        Kreg Shropshire, DPT 12/23/2015, 3:32 PM

## 2015-12-23 NOTE — Progress Notes (Signed)
Physical Therapy Treatment Patient Details Name: [name] MRN: [id number] DOB: [birth year] Today's Date: 12/23/2015    History of Present Illness Pt. is a 75 y.o. female who was admitted for a Left THR.     PT Comments    Pt did well with exercises and ambulation but struggled with bed mobility (sitting to supine) as she was unable to lift LE into bed.  Pt showed great effort t/o the session despite some initial hesitancy, but PT spent time educating pt on mobility, precautions, expectations and generally her progress thus far.  Pt fearful of doing too much, but realizes that she is WBAT and generally can do all she is able to within hip precautions.    Follow Up Recommendations  Home health PT     Equipment Recommendations  Rolling walker with 5" wheels    Recommendations for Other Services       Precautions / Restrictions Precautions Precautions: Posterior Hip Precaution Booklet Issued: Yes (comment) Restrictions LLE Weight Bearing: Weight bearing as tolerated    Mobility  Bed Mobility Overal bed mobility: Needs Assistance Bed Mobility: Sit to Supine     Supine to sit: Min assist Sit to supine: Mod assist;Min assist   General bed mobility comments: Pt shows good effort in try to get back into bed, but ultimately was unable to really lift L LE and needed assist to get back to supine  Transfers Overall transfer level: Modified independent Equipment used: Rolling walker (2 wheeled)             General transfer comment: Pt able to rise with only light cuing for hand placement and sequencing; controlled descent well  Ambulation/Gait Ambulation/Gait assistance: Min guard Ambulation Distance (Feet): 100 Feet Assistive device: Rolling walker (2 wheeled)       General Gait Details: Pt still with some hesitancy with ambulation (especially the first few steps), but after some minimal effort was able to maintain consistent forward walker motion and despite 2 brief  standing rest breaks was able to push herself and get to from the nurses' station   Financial trader Rankin (Stroke Patients Only)       Balance Overall balance assessment: Modified Independent                                  Cognition Arousal/Alertness: Awake/alert Behavior During Therapy: WFL for tasks assessed/performed Overall Cognitive Status: Within Functional Limits for tasks assessed                      Exercises Total Joint Exercises Ankle Circles/Pumps: Strengthening;10 reps Quad Sets: Strengthening;10 reps Gluteal Sets: Strengthening;10 reps Short Arc Quad: AROM;Strengthening;10 reps Heel Slides: AROM;AAROM;10 reps Hip ABduction/ADduction: AROM;10 reps Straight Leg Raises:  (unable)    General Comments        Pertinent Vitals/Pain Pain Assessment: 0-10 Pain Score: 6     Home Living Family/patient expects to be discharged to:: Private residence Living Arrangements: Spouse/significant other Available Help at Discharge: Family Type of Home: House Home Access: Stairs to enter Entrance Stairs-Rails: Left;Right (wide)   Home Equipment:  (has been borrowing a walker)      Prior Function Level of Independence: Independent with assistive device(s)  Gait / Transfers Assistance Needed: Pt apparently has been limping and needing some assist recently   Comments: Pt  only getting out of the home 1x/wk for church   PT Goals (current goals can now be found in the care plan section) Acute Rehab PT Goals Patient Stated Goal: go home PT Goal Formulation: With patient Time For Goal Achievement: 01/06/16 Potential to Achieve Goals: Fair Progress towards PT goals: Progressing toward goals    Frequency    BID      PT Plan Current plan remains appropriate    Co-evaluation             End of Session Equipment Utilized During Treatment: Gait belt Activity Tolerance: Patient tolerated  treatment well Patient left: with bed alarm set;with call bell/phone within reach;with family/visitor present     Time: [id number] PT Time Calculation (min) (ACUTE ONLY): 42 min  Charges:  $Gait Training: 8-22 mins $Therapeutic Exercise: 8-22 mins $Therapeutic Activity: 8-22 mins                    G Codes:      Kreg Shropshire, DPT 12/23/2015, 4:48 PM

## 2015-12-23 NOTE — Evaluation (Addendum)
Occupational Therapy Evaluation Patient Details Name: [name] MRN: [id number] DOB: 1940/04/09 Today's Date: 12/23/2015    History of Present Illness Pt. is a 75 y.o. female who was admitted for a Left THR. Pt. has Posterior Hip precautions.   Clinical Impression   Pt is a 75 year old female s/p Left THR (Posterior approach) who lives at home with her husband. Pt. Required assist from husband for ADLs prior to surgery.  Pt is currently limited in functional ADLs due to pain, and posterior hip precautions.  Pt. would benefit from skilled OT services for education in assistive devices,  ADL training, education about hip precautions, functional mobility, and education in recommendations for home modifications to increase safety and prevent falls.  Pt. Could benefit from follow-up OT services upon discharge.     Follow Up Recommendations  Home health OT    Equipment Recommendations       Recommendations for Other Services       Precautions / Restrictions Precautions Precautions: Posterior Hip Restrictions Weight Bearing Restrictions: Yes LLE Weight Bearing: Weight bearing as tolerated                                                     ADL Overall ADL's : Needs assistance/impaired Eating/Feeding: Set up   Grooming: Set up               Lower Body Dressing: Maximal assistance                 General ADL Comments: Pt. education was provided about A/E use for LE ADLs. Pt. and husband were educated about positioning, and pillow use with the posterior hip precautions.     Vision     Perception     Praxis      Pertinent Vitals/Pain       Hand Dominance Right   Extremity/Trunk Assessment Upper Extremity Assessment Upper Extremity Assessment: Overall WFL for tasks assessed (Pt. has a history of rotator cuff surgery. )           Communication Communication Communication: No difficulties   Cognition Arousal/Alertness:  Awake/alert Behavior During Therapy: WFL for tasks assessed/performed Overall Cognitive Status: Within Functional Limits for tasks assessed                     General Comments       Exercises       Shoulder Instructions      Home Living Family/patient expects to be discharged to:: Private residence Living Arrangements: Spouse/significant other Available Help at Discharge: Family Type of Home: House       Home Layout: One level     Bathroom Shower/Tub: Astronomer Accessibility: Yes How Accessible: Accessible via walker Home Equipment: Shower seat - built in          Prior Functioning/Environment Level of Independence: Needs assistance  Gait / Transfers Assistance Needed: Pt. used a friend's walker, however had to give it back to her friend as her friend was having surgery. ADL's / Homemaking Assistance Needed: Pt. required assist with morning ADL from husband.            OT Problem List: Decreased strength;Decreased activity tolerance;Decreased knowledge of use of DME or AE;Impaired UE functional use;Pain   OT Treatment/Interventions: Self-care/ADL training;Therapeutic exercise;Patient/family  education;DME and/or AE instruction;Therapeutic activities;Energy conservation    OT Goals(Current goals can be found in the care plan section) Acute Rehab OT Goals Patient Stated Goal: Pt. not have pain. OT Goal Formulation: With patient Potential to Achieve Goals: Good  OT Frequency: Min 2X/week   Barriers to D/C:            Co-evaluation              End of Session    Activity Tolerance: Patient tolerated treatment well Patient left: in bed;with call bell/phone within reach;with bed alarm set   Time: [id number] OT Time Calculation (min): 27 min Charges:  OT General Charges $OT Visit: 1 Procedure OT Evaluation $OT Eval Moderate Complexity: 1 Procedure OT Treatments $Self Care/Home Management : 8-22 mins G-Codes:    Harrel Carina, MS, OTR/L 12/23/2015, 11:02 AM

## 2015-12-23 NOTE — Progress Notes (Signed)
Subjective: 1 Day Post-Op Procedure(s) (LRB): TOTAL HIP ARTHROPLASTY (Left) Patient reports pain as 5 on 0-10 scale.   Patient is well, and has had no acute complaints or problems We will start therapy today.  Plan is to go Home after hospital stay. no nausea and no vomiting Patient denies any chest pains or shortness of breath. Objective: Vital signs in last 24 hours: Temp:  [97.2 F (36.2 C)-98 F (36.7 C)] 97.6 F (36.4 C) (10/26 0727) Pulse Rate:  [55-83] 73 (10/26 0727) Resp:  [12-20] 18 (10/26 0727) BP: (68-148)/(32-80) 101/32 (10/26 0727) SpO2:  [95 %-100 %] 97 % (10/26 0727) FiO2 (%):  [21 %] 21 % (10/25 1752) Weight:  [99.5 kg (219 lb 5.7 oz)] 99.5 kg (219 lb 5.7 oz) (10/26 0700) well approximated incision Heels are non tender and elevated off the bed using rolled towels Intake/Output from previous day: 10/25 0701 - 10/26 0700 In: [id number] [I.V.:3255] Out: 1510 [Urine:1100; Drains:160; Blood:250] Intake/Output this shift: No intake/output data recorded.   Recent Labs  12/23/15 0413  HGB 12.3    Recent Labs  12/23/15 0413  WBC 5.7  RBC 3.90  HCT 36.2  PLT 115*    Recent Labs  12/23/15 0413  NA 136  K 3.1*  CL 104  CO2 23  BUN 24*  CREATININE 1.19*  GLUCOSE 112*  CALCIUM 8.5*    Recent Labs  12/22/15 1022 12/23/15 0413  INR 1.09 1.18    EXAM General - Patient is Alert, Appropriate and Oriented Extremity - Neurologically intact Neurovascular intact Sensation intact distally Intact pulses distally Dorsiflexion/Plantar flexion intact No cellulitis present Compartment soft Dressing - scant drainage Motor Function - intact, moving foot and toes well on exam.  Unable to do a straight leg raise on today's visit  Past Medical History:  Diagnosis Date  . Anxiety   . Aortic insufficiency    Trivial  . Arthritis   . Asthma   . Bronchitis   . Bursitis, hip   . Claustrophobia   . Claustrophobia   . Dysrhythmia    a fib  . GERD  (gastroesophageal reflux disease)    history of  . Gout   . Hyperlipidemia   . Hypertension   . Hypothyroidism   . Mitral regurgitation   . Obesity   . Panic attacks   . Paroxysmal atrial fibrillation (HCC)   . PTSD (post-traumatic stress disorder)   . Rhinitis   . Sick sinus syndrome ([place])   . Sleep apnea    was ordered CPAP, but never fitted for one years ago, Dr has retired    Assessment/Plan: 1 Day Post-Op Procedure(s) (LRB): [place] (Left) Active Problems:   S/P total hip arthroplasty  Estimated body mass index is 41.45 kg/m as calculated from the following:   Height as of this encounter: 5\' 1"  (1.549 m).   Weight as of this encounter: 99.5 kg (219 lb 5.7 oz). Advance diet Up with therapy D/C IV fluids Plan for discharge tomorrow Discharge home with home health  Labs:  Were reviewed. Potassium 3.1. DVT Prophylaxis - Lovenox, Foot Pumps and TED hose Weight-Bearing as tolerated to  Left leg D/C O2 and Pulse OX and try on Room Air Working on bowel movement  Klor-Con added  labs tomorrow morning  would like patient to try and walk at least halfway around the nurse's desk today so that she can be discharged tomorrow  Wille Glaser R. [place] 12/23/2015, 7:40 AM

## 2015-12-23 NOTE — Progress Notes (Signed)
Clinical Social Worker (CSW) received SNF consult. PT is recommending home health. RN case manager is aware of above. Please reconsult if future social work needs arise. CSW signing off.   Areeb Corron, LCSW [phone] 

## 2015-12-23 NOTE — Progress Notes (Signed)
Initial Nutrition Assessment  DOCUMENTATION CODES:   Obesity unspecified  INTERVENTION: Provided recommendations and education regarding patient's rehabilitation and PO intake going home Patient states she has been forcing herself to eat for a while now She also states she has been eating "healthier" at home "fruits and vegetables" which has led to a 70# intentional wt loss She admits to a 5# unintentional wt loss leading into surgery Encouraged PO intake, patient appears to need lots of coaching and motivation. Per RN, some possibility of anxiety/depression going on.  Declined ONS at this time.  NUTRITION DIAGNOSIS:   Increased nutrient needs related to acute illness, chronic illness as evidenced by estimated needs.  GOAL:   Patient will meet greater than or equal to 90% of their needs  MONITOR:   PO intake, I & O's, Supplement acceptance, Labs, Weight trends  REASON FOR ASSESSMENT:   Malnutrition Screening Tool    ASSESSMENT:   [name] is a 75 yo female who presents with a hip fracture s/p Total Hip Arthoplasty  Nutrition-Focused physical exam completed. Findings are no fat depletion, no muscle depletion, and moderate-severe edema (in her lower extremities). She denies nausea/vomiting or concerns chewing/swallowing/choking Had an omelet with fruit and orange juice this morning from [employer] - she ate 50% of. Labs and medications reviewed:  K 3.1 Iron, K+ NS @ 150mL/hr  Diet Order:  Diet regular Room service appropriate? Yes; Fluid consistency: Thin  Skin:  Reviewed, no issues  Last BM:  12/20/2015  Height:   Ht Readings from Last 1 Encounters:  12/23/15 5\' 1"  (1.549 m)    Weight:   Wt Readings from Last 1 Encounters:  12/23/15 219 lb 5.7 oz (99.5 kg)    Ideal Body Weight:  47.72 kg  BMI:  Body mass index is 41.45 kg/m.  Estimated Nutritional Needs:   Kcal:  1800-2150 calories  Protein:  100-120 gm  Fluid:  >/=  1.8L  EDUCATION NEEDS:   Education needs addressed  [name]. Emily Mathew, MS, RD LDN Inpatient Clinical Dietitian Pager [phone]

## 2015-12-23 NOTE — Care Management Note (Signed)
Case Management Note  Patient Details  Name: [place] Throckmorton MRN: [id number] Date of Birth: April 02, 1940  Subjective/Objective:  RNCM consult POD # 1, left THA. Met with patient and spouse Marc [phone]) at bedside. Patient lives at home with spouse. He will be her caretaker in the home at discharge. [facility] prefers [facility] for [facility] health PT. Uses [facility]- [place] [phone]. Called Lovenox 40 mg, #14 no refills. Needs a walker, ordered from [facility].  She has a BSC at home              Action/Plan: Referral called to [facility]. Lovenox called in and walker ordered.   Expected Discharge Date:   12/24/2015               Expected Discharge Plan:  [place]  In-House Referral:     Discharge planning Services  CM Consult  Post Acute Care Choice:  Durable Medical Equipment, Home Health Choice offered to:  Patient, Spouse  DME Arranged:  Walker rolling DME Agency:  [place]:  PT [facility] Agency:  [place]  Status of Service:  In process, will continue to follow  If discussed at Long Length of Stay Meetings, dates discussed:    Additional Comments:  Jolly Mango, RN 12/23/2015, 2:13 PM

## 2015-12-23 NOTE — Anesthesia Postprocedure Evaluation (Signed)
Anesthesia Post Note  Patient: [name]  Procedure(s) Performed: Procedure(s) (LRB): TOTAL HIP ARTHROPLASTY (Left)  Patient location during evaluation: Nursing Unit Anesthesia Type: Spinal Level of consciousness: awake and alert and oriented Pain management: satisfactory to patient Vital Signs Assessment: post-procedure vital signs reviewed and stable Respiratory status: respiratory function stable Cardiovascular status: stable Postop Assessment: no headache, no backache, adequate PO intake, spinal receding, no signs of nausea or vomiting and patient able to bend at knees Anesthetic complications: no    Last Vitals:  Vitals:   12/22/15 2251 12/23/15 0436  BP: (!) 121/45 (!) 106/38  Pulse: 82 77  Resp: 19 16  Temp: 36.4 C 36.4 C    Last Pain:  Vitals:   12/23/15 0612  TempSrc:   PainSc: 5                  Blima Singer

## 2015-12-24 LAB — CBC
HCT: 33.5 % — ABNORMAL LOW (ref 35.0–47.0)
HEMOGLOBIN: 11.2 g/dL — AB (ref 12.0–16.0)
MCH: 31.1 pg (ref 26.0–34.0)
MCHC: 33.4 g/dL (ref 32.0–36.0)
MCV: 93.1 fL (ref 80.0–100.0)
Platelets: 107 10*3/uL — ABNORMAL LOW (ref 150–440)
RBC: 3.59 MIL/uL — AB (ref 3.80–5.20)
RDW: 14.4 % (ref 11.5–14.5)
WBC: 6 10*3/uL (ref 3.6–11.0)

## 2015-12-24 LAB — BASIC METABOLIC PANEL
Anion gap: 6 (ref 5–15)
BUN: 20 mg/dL (ref 6–20)
CHLORIDE: 101 mmol/L (ref 101–111)
CO2: 28 mmol/L (ref 22–32)
Calcium: 8.3 mg/dL — ABNORMAL LOW (ref 8.9–10.3)
Creatinine, Ser: 1.22 mg/dL — ABNORMAL HIGH (ref 0.44–1.00)
GFR calc Af Amer: 49 mL/min — ABNORMAL LOW (ref >60)
GFR calc non Af Amer: 42 mL/min — ABNORMAL LOW (ref >60)
GLUCOSE: 101 mg/dL — AB (ref 65–99)
POTASSIUM: 3.8 mmol/L (ref 3.5–5.1)
Sodium: 135 mmol/L (ref 135–145)

## 2015-12-24 LAB — PROTIME-INR
INR: 1.78
Prothrombin Time: 20.9 seconds — ABNORMAL HIGH (ref 11.4–15.2)

## 2015-12-24 MED ORDER — TRIAMTERENE-HCTZ 37.5-25 MG PO TABS
2.0000 | ORAL_TABLET | Freq: Every day | ORAL | Status: DC
  Administered 2015-12-24: 2 via ORAL
  Filled 2015-12-24: qty 2

## 2015-12-24 MED ORDER — LACTULOSE 10 GM/15ML PO SOLN
10.0000 g | Freq: Two times a day (BID) | ORAL | Status: DC | PRN
  Administered 2015-12-24: 10 g via ORAL
  Filled 2015-12-24: qty 30

## 2015-12-24 MED ORDER — POTASSIUM CHLORIDE CRYS ER 10 MEQ PO TBCR
10.0000 meq | EXTENDED_RELEASE_TABLET | Freq: Every day | ORAL | Status: DC
  Administered 2015-12-24: 10 meq via ORAL
  Filled 2015-12-24: qty 1

## 2015-12-24 MED ORDER — TRAMADOL HCL 50 MG PO TABS: 50.0000 mg | ORAL_TABLET | ORAL | 1 refills | Status: DC | PRN

## 2015-12-24 NOTE — Discharge Instructions (Signed)

## 2015-12-24 NOTE — Care Management Important Message (Signed)
Important Message  Patient Details  Name: [name] MRN: [id number] Date of Birth: February 15, 1941   Medicare Important Message Given:  Yes    Jolly Mango, RN 12/24/2015, 9:02 AM

## 2015-12-24 NOTE — Progress Notes (Signed)
Patient being discharged to home with [facility]. IV removed, belongings packed. Discharge & Rx instructions given and pt acknowledged understanding. Changed honeycomb dressing and gave pt a couple of extra. Family here to help her home.

## 2015-12-24 NOTE — Progress Notes (Signed)
   Subjective: 2 Days Post-Op Procedure(s) (LRB): TOTAL HIP ARTHROPLASTY (Left) Patient reports pain as 5 on 0-10 scale.  Patient states she is doing much better. Slept well during the night Patient is well, and has had no acute complaints or problems Continue with physical therapy today.  Plan is to go Home after hospital stay. no nausea and no vomiting Patient denies any chest pains or shortness of breath. Objective: Vital signs in last 24 hours: Temp:  [97.5 F (36.4 C)-99.1 F (37.3 C)] 99.1 F (37.3 C) (10/27 0421) Pulse Rate:  [69-81] 78 (10/27 0421) Resp:  [16-18] 16 (10/27 0421) BP: (96-123)/(40-58) 96/47 (10/27 0421) SpO2:  [95 %-98 %] 97 % (10/27 0421) well approximated incision Heels are non tender and elevated off the bed using rolled towels Intake/Output from previous day: 10/26 0701 - 10/27 0700 In: 885 [I.V.:285; IV Piggyback:600] Out: 255 [Urine:200; Drains:55] Intake/Output this shift: No intake/output data recorded.   Recent Labs  12/23/15 0413 12/24/15 0408  HGB 12.3 11.2*    Recent Labs  12/23/15 0413 12/24/15 0408  WBC 5.7 6.0  RBC 3.90 3.59*  HCT 36.2 33.5*  PLT 115* 107*    Recent Labs  12/23/15 0413 12/24/15 0408  NA 136 135  K 3.1* 3.8  CL 104 101  CO2 23 28  BUN 24* 20  CREATININE 1.19* 1.22*  GLUCOSE 112* 101*  CALCIUM 8.5* 8.3*    Recent Labs  12/23/15 0413 12/24/15 0408  INR 1.18 1.78    EXAM General - Patient is Alert, Appropriate and Oriented Extremity - Neurologically intact Neurovascular intact Sensation intact distally Intact pulses distally Dorsiflexion/Plantar flexion intact No cellulitis present Dressing - scant drainage Motor Function - intact, moving foot and toes well on exam. Able to do straight leg raise on her own which is improvement from yesterday.  Past Medical History:  Diagnosis Date  . Anxiety   . Aortic insufficiency    Trivial  . Arthritis   . Asthma   . Bronchitis   . Bursitis,  hip   . Claustrophobia   . Claustrophobia   . Dysrhythmia    a fib  . GERD (gastroesophageal reflux disease)    history of  . Gout   . Hyperlipidemia   . Hypertension   . Hypothyroidism   . Mitral regurgitation   . Obesity   . Panic attacks   . Paroxysmal atrial fibrillation (HCC)   . PTSD (post-traumatic stress disorder)   . Rhinitis   . Sick sinus syndrome ([place])   . Sleep apnea    was ordered CPAP, but never fitted for one years ago, Dr has retired    Assessment/Plan: 2 Days Post-Op Procedure(s) (LRB): [place] (Left) Active Problems:   S/P total hip arthroplasty  Estimated body mass index is 41.45 kg/m as calculated from the following:   Height as of this encounter: 5\' 1"  (1.549 m).   Weight as of this encounter: 99.5 kg (219 lb 5.7 oz). Up with therapy Discharge home with home health  Labs: Were reviewed. Potassium 3.8 DVT Prophylaxis - Lovenox, Foot Pumps and TED hose Weight-Bearing as tolerated to left leg Patient needs a bowel movement. Hemovac was discontinued on today's visit Discharge patient to home after she meets all criteria. Please give the patient 2 extra honeycomb dressings to take home.  Jillyn Ledger. [place] 12/24/2015, 7:28 AM

## 2015-12-24 NOTE — Discharge Summary (Signed)
Physician Discharge Summary  Patient ID: [name] MRN: [id number] DOB/AGE: 1940-06-29 75 y.o.  Admit date: 12/22/2015 Discharge date: 12/24/2015  Admission Diagnoses:  AVASCULAR NECROSIS   Discharge Diagnoses: Patient Active Problem List   Diagnosis Date Noted  . S/P total hip arthroplasty 12/22/2015  . Hypothyroidism (acquired) 12/01/2015  . Long term (current) use of opiate analgesic 12/01/2015  . Morbid obesity with BMI of 40.0-44.9, adult ([place]) 12/01/2015  . PTSD (post-traumatic stress disorder) 12/01/2015  . Spinal stenosis of lumbar region 12/01/2015  . Opioid-induced constipation ([facility]IC) 09/30/2015  . Avascular necrosis of left femoral head ([place]) 08/17/2015  . Chronic pain 08/11/2015  . Chronic hip pain (Location of Primary Source of Pain) (Left) 08/11/2015  . Long term current use of opiate analgesic 08/11/2015  . Long term prescription opiate use 08/11/2015  . Opiate use (54 MME/Day) 08/11/2015  . Encounter for therapeutic drug level monitoring 08/11/2015  . Disturbance of skin sensation 08/11/2015  . Osteoarthritis of hip (Left) 08/11/2015  . Trochanteric bursitis of hip (Left) 08/11/2015  . Lumbar central spinal stenosis (L4-5) 08/11/2015  . Lumbar facet arthropathy 08/11/2015  . 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 Atrial fibrillation ([place]) 02/16/2015  . GERD (gastroesophageal reflux disease) 10/27/2010  . Cough   . Cardiac pacemaker in situ 06/05/2008  . HYPOTHYROIDISM 06/04/2008  . HYPERLIPIDEMIA 06/04/2008  . OBESITY 06/04/2008  . Essential hypertension 06/04/2008  . SICK SINUS SYNDROME 06/04/2008    Past Medical History:  Diagnosis Date  . Anxiety   . Aortic insufficiency    Trivial  . Arthritis   . Asthma   . Bronchitis   . Bursitis, hip   . Claustrophobia   . Claustrophobia   . Dysrhythmia    a fib  . GERD (gastroesophageal reflux disease)    history of  . Gout   . Hyperlipidemia    . Hypertension   . Hypothyroidism   . Mitral regurgitation   . Obesity   . Panic attacks   . Paroxysmal atrial fibrillation ([facility])   . PTSD (post-traumatic stress disorder)   . Rhinitis   . Sick sinus syndrome ([place])   . Sleep apnea    was ordered CPAP, but never fitted for one years ago, Dr has retired     Transfusion: No transfusions given doing this admission   Consultants (if any):  no consultations.  Discharged Condition: Improved  Hospital Course: [name] is an 75 y.o. female who was admitted 12/22/2015 with a diagnosis of degenerative arthrosis left hip and went to the operating room on 12/22/2015 and underwent the above named procedures.    Surgeries:Procedure(s): TOTAL HIP ARTHROPLASTY on 12/22/2015  PRE-OPERATIVE DIAGNOSIS: Degenerative arthrosis of the left hip, primary  POST-OPERATIVE DIAGNOSIS:  Same  PROCEDURE:  Left total hip arthroplasty  SURGEON:  Marciano Sequin. M.D.  ASSISTANT:  Vance Peper, PA (present and scrubbed throughout the case, critical for assistance with exposure, retraction, instrumentation, and closure)  ANESTHESIA: spinal  ESTIMATED BLOOD LOSS: 250 mL  FLUIDS REPLACED: 1500 mL of crystalloid  DRAINS: 2 medium drains to a Hemovac reservoir  IMPLANTS UTILIZED: DePuy 13.5 mm small stature AML femoral stem, 52 mm OD Pinnacle 100 acetabular component, +4 mm 10 Pinnacle Marathon polyethylene insert, and a 36 mm M-SPEC +5 mm hip ball  INDICATIONS FOR SURGERY: [name] is a 75 y.o. year old female with a long history of progressive hip and groin  pain. X-rays demonstrated severe degenerative changes. The  patient had not seen any significant improvement despite conservative nonsurgical intervention. After discussion of the risks and benefits of surgical intervention, the patient expressed understanding of the risks benefits and agree with plans for total hip arthroplasty.   The risks, benefits, and alternatives were  discussed at length including but not limited to the risks of infection, bleeding, nerve injury, stiffness, blood clots, the need for revision surgery, limb length inequality, dislocation, cardiopulmonary complications, among others, and they were willing to proceed. Patient tolerated the surgery well. No complications .Patient was taken to PACU where she was stabilized and then transferred to the orthopedic floor.  Patient started on Lovenox 30 mg q 12 hrs. Foot pumps applied bilaterally at 80 mm hgb. Heels elevated off bed with rolled towels. No evidence of DVT. Calves non tender. Negative Homan. Physical therapy started on day #1 for gait training and transfer with OT starting on  day #1 for ADL and assisted devices. Patient has done well with therapy. Ambulated 200 feet upon being discharged. Was able to go up and down 4 steps safely and independently  Patient's IV and Foley were discontinued on day #1 with the Hemovac being discontinued on day 2 along with dressing change.  She was given perioperative antibiotics:  Anti-infectives    Start     Dose/Rate Route Frequency Ordered Stop   12/22/15 1830  ceFAZolin (ANCEF) IVPB 2g/100 mL premix     2 g 200 mL/hr over 30 Minutes Intravenous Every 6 hours 12/22/15 1751 12/23/15 1318   12/22/15 1130  ceFAZolin (ANCEF) IVPB 2g/100 mL premix     2 g 200 mL/hr over 30 Minutes Intravenous  Once 12/22/15 1128 12/22/15 1245   12/22/15 0804  ceFAZolin (ANCEF) 2-4 GM/100ML-% IVPB    Comments:  Slemenda, Debbie: cabinet override      12/22/15 0804 12/22/15 1230   12/22/15 0415  vancomycin (VANCOCIN) IVPB 1000 mg/200 mL premix     1,000 mg 200 mL/hr over 60 Minutes Intravenous  Once 12/22/15 0400 12/22/15 1214   12/22/15 0400  ceFAZolin (ANCEF) IVPB 2g/100 mL premix  Status:  Discontinued     2 g 200 mL/hr over 30 Minutes Intravenous On call to O.R. 12/22/15 0400 12/22/15 1018    .  She was AV 1 compression foot pump devices, Heel pumps, early  ambulation, and TED stockings bilaterally for DVT prophylaxis.  She benefited maximally from the hospital stay and there were no complications.    Recent vital signs:  Vitals:   12/23/15 2035 12/24/15 0421  BP: (!) 123/40 (!) 96/47  Pulse: 81 78  Resp: 16 16  Temp: 98.2 F (36.8 C) 99.1 F (37.3 C)    Recent laboratory studies:  Lab Results  Component Value Date   HGB 11.2 (L) 12/24/2015   HGB 12.3 12/23/2015   HGB 14.9 12/08/2015   Lab Results  Component Value Date   WBC 6.0 12/24/2015   PLT 107 (L) 12/24/2015   Lab Results  Component Value Date   INR 1.78 12/24/2015   Lab Results  Component Value Date   NA 135 12/24/2015   K 3.8 12/24/2015   CL 101 12/24/2015   CO2 28 12/24/2015   BUN 20 12/24/2015   CREATININE 1.22 (H) 12/24/2015   GLUCOSE 101 (H) 12/24/2015    Discharge Medications:     Medication List    TAKE these medications   5-HTP 100 MG Caps Take 100 mg by mouth 4 (four) times daily.   BENEFIBER Powd  Stir 2 tsp. TID into 4-8 oz of any non-carbonated beverage or soft food (hot or cold)   colchicine 0.6 MG tablet Take 0.6 mg by mouth daily as needed (GOUT).   diazepam 10 MG tablet Commonly known as:  VALIUM Take 10 mg by mouth 2 (two) times daily as needed for anxiety.   levothyroxine 25 MCG tablet Commonly known as:  SYNTHROID, LEVOTHROID Take 25 mcg by mouth daily before breakfast.   liothyronine 25 MCG tablet Commonly known as:  CYTOMEL 25 mcg 2 (two) times daily. 1 am 1/2 pm   magnesium sulfate 50 % injection INJ 1 ML INTO THE MUSCLE PRF MUSCLE CRAMPS UTD 2 x weekly   metoprolol 50 MG tablet Commonly known as:  LOPRESSOR Take 1.5 tablets (75 mg total) by mouth 2 (two) times daily.   oxyCODONE 5 MG immediate release tablet Commonly known as:  Oxy IR/ROXICODONE Take 1 tablet (5 mg total) by mouth 5 (five) times daily as needed for severe pain.   potassium chloride 10 MEQ CR capsule Commonly known as:  MICRO-K Take 10 mEq by  mouth 2 (two) times daily.   traMADol 50 MG tablet Commonly known as:  ULTRAM Take 1-2 tablets (50-100 mg total) by mouth every 4 (four) hours as needed for moderate pain.   triamterene-hydrochlorothiazide 75-50 MG tablet Commonly known as:  MAXZIDE Take 1 tablet by mouth daily.   UNABLE TO FIND Take 1 tablet by mouth daily. Med Name:  CLEANSE MORE HERBAL SUPPLEMENT   warfarin 5 MG tablet Commonly known as:  COUMADIN Take 5 mg by mouth daily. I tab 1 day a week All other days 1/2 tab       Diagnostic Studies: Dg Hip Port Unilat With Pelvis 1v Left  Result Date: 12/22/2015 CLINICAL DATA:  Postop total hip arthroplasty EXAM: DG HIP (WITH OR WITHOUT PELVIS) 1V PORT LEFT COMPARISON:  08/11/2015 FINDINGS: Left total hip arthroplasty changes noted. Components appear aligned. No hardware abnormality or acute osseous finding. Visualized lower bony pelvis and right hip also intact. Surgical drain in place. IMPRESSION: Expected appearance status post left total hip arthroplasty. Electronically Signed   By: Jerilynn Mages.  Shick M.D.   On: 12/22/2015 16:10    Disposition: 01-Home or Self Care  Discharge Instructions    Diet - low sodium heart healthy    Complete by:  As directed    Increase activity slowly    Complete by:  As directed       Follow-up Information    Dereck Leep, MD On 02/01/2016.   Specialty:  Orthopedic Surgery Why:  at 10:45am Contact information: [place] [address] [phone]            Signed: Watt Climes 12/24/2015, 7:37 AM

## 2015-12-24 NOTE — Progress Notes (Signed)
Physical Therapy Treatment Patient Details Name: [name] MRN: [id number] DOB: [birth year] Today's Date: 12/24/2015    History of Present Illness Pt. is a 75 y.o. female who was admitted for a Left THR.     PT Comments    Pt continues to show great effort and motivation though she is still quite weak with hip flexion and slow with mobility acts (she needs light assist for bed mobility and a few attempts to get to standing w/o hand rail use).  Discussed proper car transfer techniques and answered other questions from pt and husband.  Overall pt is progressing well and was safe with stair negotiation, pt fatigued with >100 ft of ambulation, but was safe and showed great effort.   Follow Up Recommendations  Home health PT     Equipment Recommendations  Rolling walker with 5" wheels    Recommendations for Other Services       Precautions / Restrictions Precautions Precautions: Posterior Hip Restrictions LLE Weight Bearing: Weight bearing as tolerated    Mobility  Bed Mobility Overal bed mobility: Needs Assistance Bed Mobility: Supine to Sit     Supine to sit: Min assist     General bed mobility comments: Pt again shows good effort and is able to get herself nearly to the EOB w/o assist.  She does need PT's hand to pull herself up to sitting (along with light assist to get L LE off EOB)  Transfers Overall transfer level: Modified independent Equipment used: Rolling walker (2 wheeled)             General transfer comment: Pt needed some extra cuing today with foot/hand placement (no rails today) pt needed 3 times to try getting to standing but did ultimately do it w/o direct physical assist  Ambulation/Gait Ambulation/Gait assistance: Min guard Ambulation Distance (Feet): 125 Feet Assistive device: Rolling walker (2 wheeled)       General Gait Details: Pt takes a little longer getting warmed up during ambulation but again was able to maintain consistent forward  motion with walker.  She did becomes quite fatigued with the effort but overall did well and had no LOBs or safety concerns.    Stairs Stairs: Yes Stairs assistance: Supervision Stair Management: One rail Left;Sideways Number of Stairs: 4 General stair comments: Pt was able to negotiate up/down steps safely and w/o direct assist.  Overall pt felt capable and relatively confident with the steps.   Wheelchair Mobility    Modified Rankin (Stroke Patients Only)       Balance Overall balance assessment: Modified Independent                                  Cognition Arousal/Alertness: Awake/alert Behavior During Therapy: WFL for tasks assessed/performed Overall Cognitive Status: Within Functional Limits for tasks assessed                      Exercises Total Joint Exercises Ankle Circles/Pumps: Strengthening;10 reps Quad Sets: Strengthening;15 reps Gluteal Sets: Strengthening;15 reps Short Arc Quad: Strengthening;15 reps Heel Slides: AROM;AAROM;10 reps Hip ABduction/ADduction: AROM;10 reps Knee Flexion: Strengthening;10 reps    General Comments        Pertinent Vitals/Pain Pain Score: 5     Home Living                      Prior Function  PT Goals (current goals can now be found in the care plan section) Progress towards PT goals: Progressing toward goals    Frequency    BID      PT Plan Current plan remains appropriate    Co-evaluation             End of Session Equipment Utilized During Treatment: Gait belt Activity Tolerance: Patient tolerated treatment well;Patient limited by fatigue Patient left: with call bell/phone within reach;with family/visitor present;with chair alarm set     Time: [id number] PT Time Calculation (min) (ACUTE ONLY): 43 min  Charges:  $Gait Training: 23-37 mins $Therapeutic Exercise: 8-22 mins                    G Codes:      Kreg Shropshire, DPT 12/24/2015, 10:04  AM

## 2015-12-24 NOTE — Care Management (Signed)
Lovenox cost is $ 10.00. Patient updated.  [facility] notified of discharge and will deliver equipment this morning.

## 2015-12-25 DIAGNOSIS — Z471 Aftercare following joint replacement surgery: Secondary | ICD-10-CM | POA: Diagnosis not present

## 2015-12-25 DIAGNOSIS — I10 Essential (primary) hypertension: Secondary | ICD-10-CM | POA: Diagnosis not present

## 2015-12-25 DIAGNOSIS — Z6841 Body Mass Index (BMI) 40.0 and over, adult: Secondary | ICD-10-CM | POA: Diagnosis not present

## 2015-12-25 DIAGNOSIS — Z96642 Presence of left artificial hip joint: Secondary | ICD-10-CM | POA: Diagnosis not present

## 2015-12-25 DIAGNOSIS — M533 Sacrococcygeal disorders, not elsewhere classified: Secondary | ICD-10-CM | POA: Diagnosis not present

## 2015-12-25 DIAGNOSIS — Z7901 Long term (current) use of anticoagulants: Secondary | ICD-10-CM | POA: Diagnosis not present

## 2015-12-25 DIAGNOSIS — F4310 Post-traumatic stress disorder, unspecified: Secondary | ICD-10-CM | POA: Diagnosis not present

## 2015-12-25 DIAGNOSIS — M48061 Spinal stenosis, lumbar region without neurogenic claudication: Secondary | ICD-10-CM | POA: Diagnosis not present

## 2015-12-27 DIAGNOSIS — Z471 Aftercare following joint replacement surgery: Secondary | ICD-10-CM | POA: Diagnosis not present

## 2015-12-27 DIAGNOSIS — Z96642 Presence of left artificial hip joint: Secondary | ICD-10-CM | POA: Diagnosis not present

## 2015-12-27 DIAGNOSIS — M533 Sacrococcygeal disorders, not elsewhere classified: Secondary | ICD-10-CM | POA: Diagnosis not present

## 2015-12-27 DIAGNOSIS — M48061 Spinal stenosis, lumbar region without neurogenic claudication: Secondary | ICD-10-CM | POA: Diagnosis not present

## 2015-12-27 DIAGNOSIS — I10 Essential (primary) hypertension: Secondary | ICD-10-CM | POA: Diagnosis not present

## 2015-12-27 LAB — SURGICAL PATHOLOGY

## 2015-12-27 NOTE — Care Management (Signed)
Post discharge: Received notification from [facility] stating that [facility] has made a visit to patient's home although the referral was to [facility]. I spoke with patient's husband to make sure they had received PT services as requested. I confirmed with husband that "he and patient wanted [facility] as he had used them in the past and knew something wasn't right so he called the physical therapist Marjory Lies with [facility] that he had used". He said they had is straightened out now and that [facility] PT was with patient now.

## 2015-12-29 DIAGNOSIS — I10 Essential (primary) hypertension: Secondary | ICD-10-CM | POA: Diagnosis not present

## 2015-12-29 DIAGNOSIS — Z96642 Presence of left artificial hip joint: Secondary | ICD-10-CM | POA: Diagnosis not present

## 2015-12-29 DIAGNOSIS — Z471 Aftercare following joint replacement surgery: Secondary | ICD-10-CM | POA: Diagnosis not present

## 2015-12-29 DIAGNOSIS — M48061 Spinal stenosis, lumbar region without neurogenic claudication: Secondary | ICD-10-CM | POA: Diagnosis not present

## 2015-12-29 DIAGNOSIS — M533 Sacrococcygeal disorders, not elsewhere classified: Secondary | ICD-10-CM | POA: Diagnosis not present

## 2015-12-31 DIAGNOSIS — I10 Essential (primary) hypertension: Secondary | ICD-10-CM | POA: Diagnosis not present

## 2015-12-31 DIAGNOSIS — M48061 Spinal stenosis, lumbar region without neurogenic claudication: Secondary | ICD-10-CM | POA: Diagnosis not present

## 2015-12-31 DIAGNOSIS — M533 Sacrococcygeal disorders, not elsewhere classified: Secondary | ICD-10-CM | POA: Diagnosis not present

## 2015-12-31 DIAGNOSIS — Z96642 Presence of left artificial hip joint: Secondary | ICD-10-CM | POA: Diagnosis not present

## 2015-12-31 DIAGNOSIS — Z471 Aftercare following joint replacement surgery: Secondary | ICD-10-CM | POA: Diagnosis not present

## 2016-01-03 DIAGNOSIS — Z471 Aftercare following joint replacement surgery: Secondary | ICD-10-CM | POA: Diagnosis not present

## 2016-01-03 DIAGNOSIS — M533 Sacrococcygeal disorders, not elsewhere classified: Secondary | ICD-10-CM | POA: Diagnosis not present

## 2016-01-03 DIAGNOSIS — Z96642 Presence of left artificial hip joint: Secondary | ICD-10-CM | POA: Diagnosis not present

## 2016-01-03 DIAGNOSIS — I10 Essential (primary) hypertension: Secondary | ICD-10-CM | POA: Diagnosis not present

## 2016-01-03 DIAGNOSIS — M48061 Spinal stenosis, lumbar region without neurogenic claudication: Secondary | ICD-10-CM | POA: Diagnosis not present

## 2016-01-05 DIAGNOSIS — Z96642 Presence of left artificial hip joint: Secondary | ICD-10-CM | POA: Diagnosis not present

## 2016-01-05 DIAGNOSIS — Z471 Aftercare following joint replacement surgery: Secondary | ICD-10-CM | POA: Diagnosis not present

## 2016-01-05 DIAGNOSIS — M48061 Spinal stenosis, lumbar region without neurogenic claudication: Secondary | ICD-10-CM | POA: Diagnosis not present

## 2016-01-05 DIAGNOSIS — I10 Essential (primary) hypertension: Secondary | ICD-10-CM | POA: Diagnosis not present

## 2016-01-05 DIAGNOSIS — M533 Sacrococcygeal disorders, not elsewhere classified: Secondary | ICD-10-CM | POA: Diagnosis not present

## 2016-01-07 DIAGNOSIS — Z96642 Presence of left artificial hip joint: Secondary | ICD-10-CM | POA: Diagnosis not present

## 2016-01-07 DIAGNOSIS — M48061 Spinal stenosis, lumbar region without neurogenic claudication: Secondary | ICD-10-CM | POA: Diagnosis not present

## 2016-01-07 DIAGNOSIS — Z471 Aftercare following joint replacement surgery: Secondary | ICD-10-CM | POA: Diagnosis not present

## 2016-01-07 DIAGNOSIS — M533 Sacrococcygeal disorders, not elsewhere classified: Secondary | ICD-10-CM | POA: Diagnosis not present

## 2016-01-07 DIAGNOSIS — I10 Essential (primary) hypertension: Secondary | ICD-10-CM | POA: Diagnosis not present

## 2016-01-11 DIAGNOSIS — I10 Essential (primary) hypertension: Secondary | ICD-10-CM | POA: Diagnosis not present

## 2016-01-11 DIAGNOSIS — M48061 Spinal stenosis, lumbar region without neurogenic claudication: Secondary | ICD-10-CM | POA: Diagnosis not present

## 2016-01-11 DIAGNOSIS — M533 Sacrococcygeal disorders, not elsewhere classified: Secondary | ICD-10-CM | POA: Diagnosis not present

## 2016-01-11 DIAGNOSIS — Z471 Aftercare following joint replacement surgery: Secondary | ICD-10-CM | POA: Diagnosis not present

## 2016-01-11 DIAGNOSIS — Z96642 Presence of left artificial hip joint: Secondary | ICD-10-CM | POA: Diagnosis not present

## 2016-01-14 DIAGNOSIS — M533 Sacrococcygeal disorders, not elsewhere classified: Secondary | ICD-10-CM | POA: Diagnosis not present

## 2016-01-14 DIAGNOSIS — M48061 Spinal stenosis, lumbar region without neurogenic claudication: Secondary | ICD-10-CM | POA: Diagnosis not present

## 2016-01-14 DIAGNOSIS — I10 Essential (primary) hypertension: Secondary | ICD-10-CM | POA: Diagnosis not present

## 2016-01-14 DIAGNOSIS — Z471 Aftercare following joint replacement surgery: Secondary | ICD-10-CM | POA: Diagnosis not present

## 2016-01-14 DIAGNOSIS — Z96642 Presence of left artificial hip joint: Secondary | ICD-10-CM | POA: Diagnosis not present

## 2016-01-17 DIAGNOSIS — Z96642 Presence of left artificial hip joint: Secondary | ICD-10-CM | POA: Diagnosis not present

## 2016-01-17 DIAGNOSIS — Z471 Aftercare following joint replacement surgery: Secondary | ICD-10-CM | POA: Diagnosis not present

## 2016-01-17 DIAGNOSIS — I10 Essential (primary) hypertension: Secondary | ICD-10-CM | POA: Diagnosis not present

## 2016-01-17 DIAGNOSIS — M48061 Spinal stenosis, lumbar region without neurogenic claudication: Secondary | ICD-10-CM | POA: Diagnosis not present

## 2016-01-17 DIAGNOSIS — M533 Sacrococcygeal disorders, not elsewhere classified: Secondary | ICD-10-CM | POA: Diagnosis not present

## 2016-01-18 DIAGNOSIS — I482 Chronic atrial fibrillation: Secondary | ICD-10-CM | POA: Diagnosis not present

## 2016-01-21 DIAGNOSIS — Z471 Aftercare following joint replacement surgery: Secondary | ICD-10-CM | POA: Diagnosis not present

## 2016-01-21 DIAGNOSIS — M48061 Spinal stenosis, lumbar region without neurogenic claudication: Secondary | ICD-10-CM | POA: Diagnosis not present

## 2016-01-21 DIAGNOSIS — Z96642 Presence of left artificial hip joint: Secondary | ICD-10-CM | POA: Diagnosis not present

## 2016-01-21 DIAGNOSIS — I10 Essential (primary) hypertension: Secondary | ICD-10-CM | POA: Diagnosis not present

## 2016-01-21 DIAGNOSIS — M533 Sacrococcygeal disorders, not elsewhere classified: Secondary | ICD-10-CM | POA: Diagnosis not present

## 2016-02-01 DIAGNOSIS — Z96642 Presence of left artificial hip joint: Secondary | ICD-10-CM | POA: Diagnosis not present

## 2016-02-06 DIAGNOSIS — Z96642 Presence of left artificial hip joint: Secondary | ICD-10-CM | POA: Insufficient documentation

## 2016-02-08 DIAGNOSIS — M25552 Pain in left hip: Secondary | ICD-10-CM | POA: Diagnosis not present

## 2016-02-10 ENCOUNTER — Encounter: Payer: Self-pay | Admitting: Internal Medicine

## 2016-02-11 DIAGNOSIS — M25552 Pain in left hip: Secondary | ICD-10-CM | POA: Diagnosis not present

## 2016-02-14 DIAGNOSIS — G894 Chronic pain syndrome: Secondary | ICD-10-CM | POA: Insufficient documentation

## 2016-02-14 NOTE — Progress Notes (Signed)
Patient's Name: [name]  MRN: [id number]  Referring Provider: Malachi Carl, MD  DOB: 1940/06/07  PCP: Malachi Carl, MD  DOS: 02/15/2016  Note by: Kathlen Brunswick. Dossie Arbour, MD  Service setting: Ambulatory outpatient  Specialty: Interventional Pain Management  Location: [facility] (AMB) Pain Management Facility    Patient type: Established   Primary Reason(s) for Visit: Encounter for prescription drug management (Level of risk: moderate) CC: Leg Pain (left upper)  HPI  [name] is a 75 y.o. year old, female patient, who comes today for a medication management evaluation. She has HYPOTHYROIDISM; HYPERLIPIDEMIA; OBESITY; Essential hypertension; SICK SINUS SYNDROME; Cardiac pacemaker in situ; Cough; GERD (gastroesophageal reflux disease); Atrial fibrillation ([place]); Chronic hip pain (Location of Primary Source of Pain) (Left); Long term current use of opiate analgesic; Long term prescription opiate use; Opiate use (54 MME/Day); Encounter for therapeutic drug level monitoring; Disturbance of skin sensation; Osteoarthritis of hip (Left); Trochanteric bursitis of hip (Left); Lumbar central spinal stenosis (L4-5); Lumbar facet arthropathy; Osteoarthritis of sacroiliac joint (with vacuum phenomena in) (Bilateral); Avascular necrosis of left femoral head ([place]); Opioid-induced constipation ([facility]); Hypothyroidism (acquired); Long term (current) use of opiate analgesic; Morbid obesity with BMI of 40.0-44.9, adult ([place]); PTSD (post-traumatic stress disorder); Spinal stenosis of lumbar region; S/P total hip arthroplasty; Chronic pain syndrome; and Status post total replacement of left hip on her problem list. Her primarily concern today is the Leg Pain (left upper)  Pain Assessment: Self-Reported Pain Score: 0-No pain/10             Reported level is compatible with observation.       Pain Type: Chronic pain Pain Location: Leg Pain Orientation: Left Pain Descriptors / Indicators: Aching Pain Frequency:  Intermittent  [name] was last seen on 12/01/2015 for medication management. During today's appointment we reviewed [name]'s chronic pain status, as well as her outpatient medication regimen.  The patient  reports that she does not use drugs. Her body mass index is 40.06 kg/m.  Further details on both, my assessment(s), as well as the proposed treatment plan, please see below.  Controlled Substance Pharmacotherapy Assessment REMS (Risk Evaluation and Mitigation Strategy)  Analgesic:Oxycodone IR 5 mg 1 tablet by mouth 5 times a day (25 mg/day) MME/day:37.17m/day KZenovia Jarred RN  02/15/2016  8:08 AM  Signed Nursing Pain Medication Assessment:  Safety precautions to be maintained throughout the outpatient stay will include: orient to surroundings, keep bed in low position, maintain call bell within reach at all times, provide assistance with transfer out of bed and ambulation.  Medication Inspection Compliance: Pill count conducted under aseptic conditions, in front of the patient. Neither the pills nor the bottle was removed from the patient's sight at any time. Once count was completed pills were immediately returned to the patient in their original bottle. Pill Count: 118 of 150 pills remain Bottle Appearance: Standard pharmacy container. Clearly labeled. Medication: Oxycodone IR Filled Date: 17/ 12 / 2017   Had hip replacement on 12-22-15.  Was given Tramadol post surgery.  Patient states she is no longer taking it.   Pharmacokinetics: Liberation and absorption (onset of action): WNL Distribution (time to peak effect): WNL Metabolism and excretion (duration of action): WNL         Pharmacodynamics: Desired effects: Analgesia: Ms. BRhymesreports >50% benefit. Functional ability: Patient reports that medication allows her to accomplish basic ADLs Clinically meaningful improvement in function (CMIF): Sustained CMIF goals met Perceived effectiveness: Described as relatively  effective, allowing for increase in  activities of daily living (ADL) Undesirable effects: Side-effects or Adverse reactions: None reported Monitoring: [place] PMP: Online review of the past 33-monthperiod conducted. Compliant with practice rules and regulations List of all UDS test(s) done:  Lab Results  Component Value Date   TOXASSSELUR FINAL 09/30/2015   SUMMARY FINAL 08/11/2015   Last UDS on record: ToxAssure Select 13  Date Value Ref Range Status  09/30/2015 FINAL  Final    Comment:    ==================================================================== TOXASSURE SELECT 13 (MW) ==================================================================== Test                             Result       Flag       Units Drug Present and Declared for Prescription Verification   Oxycodone                      405          EXPECTED   ng/mg creat   Oxymorphone                    1262         EXPECTED   ng/mg creat   Noroxycodone                   1066         EXPECTED   ng/mg creat   Noroxymorphone                 321          EXPECTED   ng/mg creat    Sources of oxycodone are scheduled prescription medications.    Oxymorphone, noroxycodone, and noroxymorphone are expected    metabolites of oxycodone. Oxymorphone is also available as a    scheduled prescription medication. Drug Present not Declared for Prescription Verification   Oxazepam                       76           UNEXPECTED ng/mg creat    Oxazepam may be administered as a scheduled prescription    medication; it is also an expected metabolite of other    benzodiazepine drugs, including diazepam, chlordiazepoxide,    prazepam, clorazepate, halazepam, and temazepam. ==================================================================== Test                      Result    Flag   Units      Ref Range   Creatinine              151              mg/dL      >=20 ==================================================================== Declared  Medications:  The flagging and interpretation on this report are based on the  following declared medications.  Unexpected results may arise from  inaccuracies in the declared medications.  **Note: The testing scope of this panel includes these medications:  Oxycodone (Roxicodone)  **Note: The testing scope of this panel does not include following  reported medications:  Colchicine  Levothyroxine (Synthroid)  LIOTHYRONINE (Cytomel)  Magnesium (Mag)  Metoprolol (Lopressor)  Multivitamin (MVI)  Potassium (Micro-K)  Supplement  Warfarin (Coumadin) ==================================================================== For clinical consultation, please call ([phone] ====================================================================    UDS interpretation: Compliant Patient informed of the CDC guidelines and recommendations to stay away from the concomitant use of benzodiazepines and opioids due to  the increased risk of respiratory depression and death. Medication Assessment Form: Reviewed. Patient indicates being compliant with therapy Treatment compliance: Compliant Risk Assessment Profile: Aberrant behavior: See prior evaluations. None observed or detected today Comorbid factors increasing risk of overdose: See prior notes. No additional risks detected today Risk of substance use disorder (SUD): Low Opioid Risk Tool (ORT) Total Score: 0  Interpretation Table:  Score <3 = Low Risk for SUD  Score between 4-7 = Moderate Risk for SUD  Score >8 = High Risk for Opioid Abuse   Risk Mitigation Strategies:  Patient Counseling: Covered Patient-Prescriber Agreement (PPA): Present and active  Notification to other healthcare providers: Done  Pharmacologic Plan: No change in therapy, at this time  Laboratory Chemistry  Inflammation Markers Lab Results  Component Value Date   ESRSEDRATE 28 12/08/2015   Renal Function Lab Results  Component Value Date   BUN 20 12/24/2015    CREATININE 1.22 (H) 12/24/2015   GFRAA 49 (L) 12/24/2015   GFRNONAA 42 (L) 12/24/2015   Hepatic Function Lab Results  Component Value Date   AST 29 12/08/2015   ALT 19 12/08/2015   ALBUMIN 4.2 12/08/2015   Electrolytes Lab Results  Component Value Date   NA 135 12/24/2015   K 3.8 12/24/2015   CL 101 12/24/2015   CALCIUM 8.3 (L) 12/24/2015   Pain Modulating Vitamins Lab Results  Component Value Date   25OHVITD1 42 09/30/2015   25OHVITD2 7.3 09/30/2015   25OHVITD3 35 09/30/2015   VITAMINB12 >7,500 (H) 09/30/2015   Coagulation Parameters Lab Results  Component Value Date   INR 1.78 12/24/2015   LABPROT 20.9 (H) 12/24/2015   APTT 36 12/08/2015   PLT 107 (L) 12/24/2015   Cardiovascular Lab Results  Component Value Date   HGB 11.2 (L) 12/24/2015   HCT 33.5 (L) 12/24/2015   Note: Lab results reviewed.  Recent Diagnostic Imaging Review  No results found. Note: Imaging results reviewed.          Meds  The patient has a current medication list which includes the following prescription(s): 5-htp, colchicine, diazepam, levothyroxine, liothyronine, magnesium sulfate, metoprolol, oxycodone, oxycodone, oxycodone, potassium chloride, triamterene-hydrochlorothiazide, warfarin, and benefiber.  Current Outpatient Prescriptions on File Prior to Visit  Medication Sig  . 5-Hydroxytryptophan (5-HTP) 100 MG CAPS Take 100 mg by mouth 4 (four) times daily.  . colchicine 0.6 MG tablet Take 0.6 mg by mouth daily as needed (GOUT).   Marland Kitchen diazepam (VALIUM) 10 MG tablet Take 10 mg by mouth 2 (two) times daily as needed for anxiety.  Marland Kitchen levothyroxine (SYNTHROID, LEVOTHROID) 25 MCG tablet Take 25 mcg by mouth daily before breakfast.   . liothyronine (CYTOMEL) 25 MCG tablet 25 mcg 2 (two) times daily. 1 am 1/2 pm  . magnesium sulfate 50 % injection INJ 1 ML INTO THE MUSCLE PRF MUSCLE CRAMPS UTD 2 x weekly  . metoprolol (LOPRESSOR) 50 MG tablet Take 1.5 tablets (75 mg total) by mouth 2 (two)  times daily.  . potassium chloride (MICRO-K) 10 MEQ CR capsule Take 10 mEq by mouth 2 (two) times daily.  Marland Kitchen triamterene-hydrochlorothiazide (MAXZIDE) 75-50 MG tablet Take 1 tablet by mouth daily.  Marland Kitchen warfarin (COUMADIN) 5 MG tablet Take 5 mg by mouth daily. I tab 1 day a week All other days 1/2 tab  . Wheat Dextrin (BENEFIBER) POWD Stir 2 tsp. TID into 4-8 oz of any non-carbonated beverage or soft food (hot or cold)   No current facility-administered medications on file prior to visit.  ROS  Constitutional: Denies any fever or chills Gastrointestinal: No reported hemesis, hematochezia, vomiting, or acute GI distress Musculoskeletal: Denies any acute onset joint swelling, redness, loss of ROM, or weakness Neurological: No reported episodes of acute onset apraxia, aphasia, dysarthria, agnosia, amnesia, paralysis, loss of coordination, or loss of consciousness  Allergies  Ms. Onder is allergic to contrast media [iodinated diagnostic agents].  PFSH  Drug: Ms. Clapp  reports that she does not use drugs. Alcohol:  reports that she does not drink alcohol. Tobacco:  reports that she has never smoked. She has never used smokeless tobacco. Medical:  has a past medical history of Anxiety; Aortic insufficiency; Arthritis; Asthma; Bronchitis; Bursitis, hip; Claustrophobia; Claustrophobia; Dysrhythmia; GERD (gastroesophageal reflux disease); Gout; Hyperlipidemia; Hypertension; Hypothyroidism; Mitral regurgitation; Obesity; Panic attacks; Paroxysmal atrial fibrillation (HCC); PTSD (post-traumatic stress disorder); Rhinitis; Sick sinus syndrome ([place]); and Sleep apnea. Family: family history includes Alzheimer's disease in her father; Arthritis in her sister; Cancer in her brother and maternal grandfather; Heart disease in her mother; Hypertension in her sister; Stroke in her mother.  Past Surgical History:  Procedure Laterality Date  . DILATATION & CURETTAGE/HYSTEROSCOPY WITH MYOSURE N/A 08/03/2015    Procedure: DILATATION & CURETTAGE/HYSTEROSCOPY WITH MYOSURE;  Surgeon: Alden Hipp, MD;  Location: [facility];  Service: Gynecology;  Laterality: N/A;  Patient has an ICD   . INSERT / REPLACE / REMOVE PACEMAKER    . PACEMAKER INSERTION     Status post insertion  . ROTATOR CUFF REPAIR     left  . Status post child birth x1    . TONSILLECTOMY    . TOTAL HIP ARTHROPLASTY Left 12/22/2015   Procedure: TOTAL HIP ARTHROPLASTY;  Surgeon: Dereck Leep, MD;  Location: [facility];  Service: Orthopedics;  Laterality: Left;   Constitutional Exam  General appearance: Well nourished, well developed, and well hydrated. In no apparent acute distress Vitals:   02/15/16 0758  BP: (!) 142/46  Pulse: 71  Resp: 16  Temp: 97.7 F (36.5 C)  SpO2: 99%  Weight: 212 lb (96.2 kg)  Height: 5' 1"  (1.549 m)   BMI Assessment: Estimated body mass index is 40.06 kg/m as calculated from the following:   Height as of this encounter: 5' 1"  (1.549 m).   Weight as of this encounter: 212 lb (96.2 kg).  BMI interpretation table: BMI level Category Range association with higher incidence of chronic pain  <18 kg/m2 Underweight   18.5-24.9 kg/m2 Ideal body weight   25-29.9 kg/m2 Overweight Increased incidence by 20%  30-34.9 kg/m2 Obese (Class I) Increased incidence by 68%  35-39.9 kg/m2 Severe obesity (Class II) Increased incidence by 136%  >40 kg/m2 Extreme obesity (Class III) Increased incidence by 254%   BMI Readings from Last 4 Encounters:  02/15/16 40.06 kg/m  12/23/15 41.45 kg/m  12/01/15 41.19 kg/m  10/27/15 41.19 kg/m   Wt Readings from Last 4 Encounters:  02/15/16 212 lb (96.2 kg)  12/23/15 219 lb 5.7 oz (99.5 kg)  12/01/15 218 lb (98.9 kg)  10/27/15 218 lb (98.9 kg)  Psych/Mental status: Alert, oriented x 3 (person, place, & time) Eyes: PERLA Respiratory: No evidence of acute respiratory distress  Cervical Spine Exam  Inspection: No masses, redness, or swelling Alignment:  Symmetrical Functional ROM: Unrestricted ROM Stability: No instability detected Muscle strength & Tone: Functionally intact Sensory: Unimpaired Palpation: Non-contributory  Upper Extremity (UE) Exam    Side: Right upper extremity  Side: Left upper extremity  Inspection: No masses, redness, swelling, or asymmetry  Inspection:  No masses, redness, swelling, or asymmetry  Functional ROM: Unrestricted ROM          Functional ROM: Unrestricted ROM          Muscle strength & Tone: Functionally intact  Muscle strength & Tone: Functionally intact  Sensory: Unimpaired  Sensory: Unimpaired  Palpation: Non-contributory  Palpation: Non-contributory   Thoracic Spine Exam  Inspection: No masses, redness, or swelling Alignment: Symmetrical Functional ROM: Unrestricted ROM Stability: No instability detected Sensory: Unimpaired Muscle strength & Tone: Functionally intact Palpation: Non-contributory  Lumbar Spine Exam  Inspection: No masses, redness, or swelling Alignment: Symmetrical Functional ROM: Unrestricted ROM Stability: No instability detected Muscle strength & Tone: Functionally intact Sensory: Unimpaired Palpation: Non-contributory Provocative Tests: Lumbar Hyperextension and rotation test: evaluation deferred today       Patrick's Maneuver: evaluation deferred today              Gait & Posture Assessment  Ambulation: Unassisted Gait: Relatively normal for age and body habitus Posture: WNL   Lower Extremity Exam    Side: Right lower extremity  Side: Left lower extremity  Inspection: No masses, redness, swelling, or asymmetry  Inspection: No masses, redness, swelling, or asymmetry  Functional ROM: Unrestricted ROM          Functional ROM: Unrestricted ROM          Muscle strength & Tone: Functionally intact  Muscle strength & Tone: Functionally intact  Sensory: Unimpaired  Sensory: Unimpaired  Palpation: Non-contributory  Palpation: Non-contributory   Assessment  Primary  Diagnosis & Pertinent Problem List: The primary encounter diagnosis was Chronic pain syndrome. Diagnoses of Chronic hip pain (Location of Primary Source of Pain) (Left), Osteoarthritis of sacroiliac joint (with vacuum phenomena in) (Bilateral), Avascular necrosis of left femoral head ([place]), Long term current use of opiate analgesic, Opiate use (54 MME/Day), and Long term prescription opiate use were also pertinent to this visit.  Status Diagnosis   Stable  Stable  Stable 1. Chronic pain syndrome   2. Chronic hip pain (Location of Primary Source of Pain) (Left)   3. Osteoarthritis of sacroiliac joint (with vacuum phenomena in) (Bilateral)   4. Avascular necrosis of left femoral head (HCC)   5. Long term current use of opiate analgesic   6. Opiate use (54 MME/Day)   7. Long term prescription opiate use      Plan of Care  Pharmacotherapy (Medications Ordered): Meds ordered this encounter  Medications  . oxyCODONE (OXY IR/ROXICODONE) 5 MG immediate release tablet    Sig: Take 1 tablet (5 mg total) by mouth 5 (five) times daily as needed for severe pain.    Dispense:  150 tablet    Refill:  0    Do not place this medication, or any other prescription from our practice, on "Automatic Refill". Patient may have prescription filled one day early if pharmacy is closed on scheduled refill date. Do not fill until: 02/29/16 To last until: 03/30/16  . oxyCODONE (OXY IR/ROXICODONE) 5 MG immediate release tablet    Sig: Take 1 tablet (5 mg total) by mouth 5 (five) times daily as needed for severe pain.    Dispense:  150 tablet    Refill:  0    Patient may have prescription filled one day early if pharmacy is closed on scheduled refill date. Do not fill until: 03/30/16 To last until: 04/29/16  . oxyCODONE (OXY IR/ROXICODONE) 5 MG immediate release tablet    Sig: Take 1 tablet (5 mg total) by mouth  5 (five) times daily as needed for severe pain.    Dispense:  150 tablet    Refill:  0    Patient may  have prescription filled one day early if pharmacy is closed on scheduled refill date. Do not fill until: 04/29/16 To last until: 05/29/16   New Prescriptions   OXYCODONE (OXY IR/ROXICODONE) 5 MG IMMEDIATE RELEASE TABLET    Take 1 tablet (5 mg total) by mouth 5 (five) times daily as needed for severe pain.   OXYCODONE (OXY IR/ROXICODONE) 5 MG IMMEDIATE RELEASE TABLET    Take 1 tablet (5 mg total) by mouth 5 (five) times daily as needed for severe pain.   Medications administered today: Ms. Stallworth had no medications administered during this visit. Lab-work, procedure(s), and/or referral(s): No orders of the defined types were placed in this encounter.  Imaging and/or referral(s): None  Interventional therapies: Planned, scheduled, and/or pending:   None at this time.    Considering:   None at this time.    Palliative PRN treatment(s):   None at this time.    Provider-requested follow-up: Return in about 3 months (around 05/15/2016) for Med-Mgmt.  Future Appointments Date Time Provider [place]  02/24/2016 9:00 AM Evans Lance, MD [facility]  05/11/2016 1:15 PM Milinda Pointer, MD [facility] None   Primary Care Physician: Malachi Carl, MD Location: [facility] Note by: Kathlen Brunswick. Dossie Arbour, M.D, DABA, DABAPM, DABPM, DABIPP, FIPP Date: 02/15/16; Time: 1:49 PM  Pain Score Disclaimer: We use the NRS-11 scale. This is a self-reported, subjective measurement of pain severity with only modest accuracy. It is used primarily to identify changes within a particular patient. It must be understood that outpatient pain scales are significantly less accurate that those used for research, where they can be applied under ideal controlled circumstances with minimal exposure to variables. In reality, the score is likely to be a combination of pain intensity and pain affect, where pain affect describes the degree of emotional arousal or changes in  action readiness caused by the sensory experience of pain. Factors such as social and work situation, setting, emotional state, anxiety levels, expectation, and prior pain experience may influence pain perception and show large inter-individual differences that may also be affected by time variables.  Patient instructions provided during this appointment: There are no Patient Instructions on file for this visit.

## 2016-02-15 ENCOUNTER — Encounter: Payer: Self-pay | Admitting: Pain Medicine

## 2016-02-15 ENCOUNTER — Ambulatory Visit: Payer: Medicare Other | Attending: Pain Medicine | Admitting: Pain Medicine

## 2016-02-15 VITALS — BP 142/46 | HR 71 | Temp 97.7°F | Resp 16 | Ht 61.0 in | Wt 212.0 lb

## 2016-02-15 DIAGNOSIS — Z950 Presence of cardiac pacemaker: Secondary | ICD-10-CM | POA: Diagnosis not present

## 2016-02-15 DIAGNOSIS — E785 Hyperlipidemia, unspecified: Secondary | ICD-10-CM | POA: Diagnosis not present

## 2016-02-15 DIAGNOSIS — Z96642 Presence of left artificial hip joint: Secondary | ICD-10-CM | POA: Insufficient documentation

## 2016-02-15 DIAGNOSIS — Z6841 Body Mass Index (BMI) 40.0 and over, adult: Secondary | ICD-10-CM | POA: Diagnosis not present

## 2016-02-15 DIAGNOSIS — M109 Gout, unspecified: Secondary | ICD-10-CM | POA: Insufficient documentation

## 2016-02-15 DIAGNOSIS — Z8249 Family history of ischemic heart disease and other diseases of the circulatory system: Secondary | ICD-10-CM | POA: Diagnosis not present

## 2016-02-15 DIAGNOSIS — Z79891 Long term (current) use of opiate analgesic: Secondary | ICD-10-CM

## 2016-02-15 DIAGNOSIS — M87052 Idiopathic aseptic necrosis of left femur: Secondary | ICD-10-CM | POA: Diagnosis not present

## 2016-02-15 DIAGNOSIS — I495 Sick sinus syndrome: Secondary | ICD-10-CM | POA: Diagnosis not present

## 2016-02-15 DIAGNOSIS — M461 Sacroiliitis, not elsewhere classified: Secondary | ICD-10-CM

## 2016-02-15 DIAGNOSIS — I482 Chronic atrial fibrillation: Secondary | ICD-10-CM | POA: Diagnosis not present

## 2016-02-15 DIAGNOSIS — M25552 Pain in left hip: Secondary | ICD-10-CM | POA: Diagnosis not present

## 2016-02-15 DIAGNOSIS — I480 Paroxysmal atrial fibrillation: Secondary | ICD-10-CM | POA: Insufficient documentation

## 2016-02-15 DIAGNOSIS — Z471 Aftercare following joint replacement surgery: Secondary | ICD-10-CM | POA: Diagnosis not present

## 2016-02-15 DIAGNOSIS — Z91041 Radiographic dye allergy status: Secondary | ICD-10-CM | POA: Diagnosis not present

## 2016-02-15 DIAGNOSIS — Z809 Family history of malignant neoplasm, unspecified: Secondary | ICD-10-CM | POA: Insufficient documentation

## 2016-02-15 DIAGNOSIS — J45909 Unspecified asthma, uncomplicated: Secondary | ICD-10-CM | POA: Diagnosis not present

## 2016-02-15 DIAGNOSIS — G894 Chronic pain syndrome: Secondary | ICD-10-CM | POA: Diagnosis not present

## 2016-02-15 DIAGNOSIS — M47818 Spondylosis without myelopathy or radiculopathy, sacral and sacrococcygeal region: Secondary | ICD-10-CM | POA: Insufficient documentation

## 2016-02-15 DIAGNOSIS — M87852 Other osteonecrosis, left femur: Secondary | ICD-10-CM | POA: Diagnosis not present

## 2016-02-15 DIAGNOSIS — M48061 Spinal stenosis, lumbar region without neurogenic claudication: Secondary | ICD-10-CM | POA: Diagnosis not present

## 2016-02-15 DIAGNOSIS — K219 Gastro-esophageal reflux disease without esophagitis: Secondary | ICD-10-CM | POA: Diagnosis not present

## 2016-02-15 DIAGNOSIS — F419 Anxiety disorder, unspecified: Secondary | ICD-10-CM | POA: Diagnosis not present

## 2016-02-15 DIAGNOSIS — E039 Hypothyroidism, unspecified: Secondary | ICD-10-CM | POA: Insufficient documentation

## 2016-02-15 DIAGNOSIS — M79605 Pain in left leg: Secondary | ICD-10-CM | POA: Diagnosis not present

## 2016-02-15 DIAGNOSIS — I10 Essential (primary) hypertension: Secondary | ICD-10-CM | POA: Insufficient documentation

## 2016-02-15 DIAGNOSIS — Z8261 Family history of arthritis: Secondary | ICD-10-CM | POA: Insufficient documentation

## 2016-02-15 DIAGNOSIS — M1612 Unilateral primary osteoarthritis, left hip: Secondary | ICD-10-CM | POA: Diagnosis not present

## 2016-02-15 DIAGNOSIS — G8929 Other chronic pain: Secondary | ICD-10-CM

## 2016-02-15 DIAGNOSIS — Z823 Family history of stroke: Secondary | ICD-10-CM | POA: Insufficient documentation

## 2016-02-15 DIAGNOSIS — Z7901 Long term (current) use of anticoagulants: Secondary | ICD-10-CM | POA: Diagnosis not present

## 2016-02-15 DIAGNOSIS — F1190 Opioid use, unspecified, uncomplicated: Secondary | ICD-10-CM

## 2016-02-15 MED ORDER — OXYCODONE HCL 5 MG PO TABS: 5.0000 mg | ORAL_TABLET | Freq: Every day | ORAL | 0 refills | Status: DC | PRN

## 2016-02-15 NOTE — Progress Notes (Signed)
Nursing Pain Medication Assessment:  Safety precautions to be maintained throughout the outpatient stay will include: orient to surroundings, keep bed in low position, maintain call bell within reach at all times, provide assistance with transfer out of bed and ambulation.  Medication Inspection Compliance: Pill count conducted under aseptic conditions, in front of the patient. Neither the pills nor the bottle was removed from the patient's sight at any time. Once count was completed pills were immediately returned to the patient in their original bottle. Pill Count: 118 of 150 pills remain Bottle Appearance: Standard pharmacy container. Clearly labeled. Medication: Oxycodone IR Filled Date: 29 / 12 / 2017   Had hip replacement on 12-22-15.  Was given Tramadol post surgery.  Patient states she is no longer taking it.

## 2016-02-16 DIAGNOSIS — R29898 Other symptoms and signs involving the musculoskeletal system: Secondary | ICD-10-CM | POA: Diagnosis not present

## 2016-02-16 DIAGNOSIS — M25552 Pain in left hip: Secondary | ICD-10-CM | POA: Diagnosis not present

## 2016-02-16 DIAGNOSIS — R262 Difficulty in walking, not elsewhere classified: Secondary | ICD-10-CM | POA: Diagnosis not present

## 2016-02-18 DIAGNOSIS — M25552 Pain in left hip: Secondary | ICD-10-CM | POA: Diagnosis not present

## 2016-02-23 DIAGNOSIS — M25552 Pain in left hip: Secondary | ICD-10-CM | POA: Diagnosis not present

## 2016-02-24 ENCOUNTER — Ambulatory Visit (INDEPENDENT_AMBULATORY_CARE_PROVIDER_SITE_OTHER): Payer: Medicare Other | Admitting: Internal Medicine

## 2016-02-24 ENCOUNTER — Encounter: Payer: Self-pay | Admitting: Internal Medicine

## 2016-02-24 VITALS — BP 140/80 | HR 81 | Ht 60.0 in | Wt 217.8 lb

## 2016-02-24 DIAGNOSIS — I482 Chronic atrial fibrillation, unspecified: Secondary | ICD-10-CM

## 2016-02-24 DIAGNOSIS — Z950 Presence of cardiac pacemaker: Secondary | ICD-10-CM | POA: Diagnosis not present

## 2016-02-24 LAB — CUP PACEART INCLINIC DEVICE CHECK
Battery Impedance: 3400 Ohm
Battery Voltage: 2.76 V
Date Time Interrogation Session: 20171228141333
Implantable Lead Location: 753859
Implantable Lead Location: 753860
Implantable Pulse Generator Implant Date: 20090317
Lead Channel Pacing Threshold Pulse Width: 0.4 ms
Lead Channel Setting Pacing Amplitude: 2.5 V
Lead Channel Setting Sensing Sensitivity: 2 mV
MDC IDC LEAD IMPLANT DT: 20090317
MDC IDC LEAD IMPLANT DT: 20090317
MDC IDC MSMT LEADCHNL RV IMPEDANCE VALUE: 520 Ohm
MDC IDC MSMT LEADCHNL RV PACING THRESHOLD AMPLITUDE: 0.5 V
MDC IDC MSMT LEADCHNL RV SENSING INTR AMPL: 12 mV
MDC IDC PG SERIAL: 2056081
MDC IDC SET LEADCHNL RV PACING PULSEWIDTH: 0.4 ms
MDC IDC STAT BRADY RV PERCENT PACED: 56 %
Pulse Gen Model: 5826

## 2016-02-24 NOTE — Progress Notes (Signed)
HPI [name] returns today for followup. She is a very pleasant 75 year old woman with a history of hypertension, symptomatic bradycardia, chronic atrial fibrillation, status post permanent pacemaker insertion. She remains morbidly obese. The patient notes occasional mild peripheral edema. She denies chest pain or shortness of breath. No recent falls. She does have arthritis. She has minimal palpitations. In the interim, she has not been in the hospital except for hip replacement surgery. Allergies  Allergen Reactions  . Contrast Media [Iodinated Diagnostic Agents] Rash    Contrast dye causes swelling     Current Outpatient Prescriptions  Medication Sig Dispense Refill  . 5-Hydroxytryptophan (5-HTP) 100 MG CAPS Take 100 mg by mouth 4 (four) times daily.    . colchicine 0.6 MG tablet Take 0.6 mg by mouth daily as needed (GOUT).     Marland Kitchen diazepam (VALIUM) 10 MG tablet Take 10 mg by mouth 2 (two) times daily as needed for anxiety.    Marland Kitchen levothyroxine (SYNTHROID, LEVOTHROID) 25 MCG tablet Take 25 mcg by mouth daily before breakfast.     . liothyronine (CYTOMEL) 25 MCG tablet 25 mcg 2 (two) times daily. 1 am 1/2 pm    . magnesium sulfate 50 % injection INJ 1 ML INTO THE MUSCLE PRF MUSCLE CRAMPS UTD 2 x weekly  5  . metoprolol (LOPRESSOR) 50 MG tablet Take 1.5 tablets (75 mg total) by mouth 2 (two) times daily. 270 tablet 3  . [START ON 02/29/2016] oxyCODONE (OXY IR/ROXICODONE) 5 MG immediate release tablet Take 1 tablet (5 mg total) by mouth 5 (five) times daily as needed for severe pain. 150 tablet 0  . [START ON 03/30/2016] oxyCODONE (OXY IR/ROXICODONE) 5 MG immediate release tablet Take 1 tablet (5 mg total) by mouth 5 (five) times daily as needed for severe pain. 150 tablet 0  . [START ON 04/29/2016] oxyCODONE (OXY IR/ROXICODONE) 5 MG immediate release tablet Take 1 tablet (5 mg total) by mouth 5 (five) times daily as needed for severe pain. 150 tablet 0  . potassium chloride (MICRO-K) 10 MEQ CR capsule  Take 10 mEq by mouth 2 (two) times daily.  3  . triamterene-hydrochlorothiazide (MAXZIDE) 75-50 MG tablet Take 1 tablet by mouth daily.    Marland Kitchen warfarin (COUMADIN) 5 MG tablet Take 5 mg by mouth daily. I tab 1 day a week All other days 1/2 tab    . Wheat Dextrin (BENEFIBER) POWD Stir 2 tsp. TID into 4-8 oz of any non-carbonated beverage or soft food (hot or cold) 500 g 2   No current facility-administered medications for this visit.      Past Medical History:  Diagnosis Date  . Anxiety   . Aortic insufficiency    Trivial  . Arthritis   . Asthma   . Bronchitis   . Bursitis, hip   . Claustrophobia   . Claustrophobia   . Dysrhythmia    a fib  . GERD (gastroesophageal reflux disease)    history of  . Gout   . Hyperlipidemia   . Hypertension   . Hypothyroidism   . Mitral regurgitation   . Obesity   . Panic attacks   . Paroxysmal atrial fibrillation (HCC)   . PTSD (post-traumatic stress disorder)   . Rhinitis   . Sick sinus syndrome ([place])   . Sleep apnea    was ordered CPAP, but never fitted for one years ago, Dr has retired    ROS:   All systems reviewed and negative except as noted in the HPI.  Past Surgical History:  Procedure Laterality Date  . DILATATION & CURETTAGE/HYSTEROSCOPY WITH MYOSURE N/A 08/03/2015   Procedure: DILATATION & CURETTAGE/HYSTEROSCOPY WITH MYOSURE;  Surgeon: Alden Hipp, MD;  Location: [facility];  Service: Gynecology;  Laterality: N/A;  Patient has an ICD   . INSERT / REPLACE / REMOVE PACEMAKER    . PACEMAKER INSERTION     Status post insertion  . ROTATOR CUFF REPAIR     left  . Status post child birth x1    . TONSILLECTOMY    . TOTAL HIP ARTHROPLASTY Left 12/22/2015   Procedure: TOTAL HIP ARTHROPLASTY;  Surgeon: Dereck Leep, MD;  Location: [facility];  Service: Orthopedics;  Laterality: Left;     Family History  Problem Relation Age of Onset  . Stroke Mother   . Heart disease Mother   . Alzheimer's disease Father   . Hypertension  Sister   . Cancer Brother   . Arthritis Sister   . Cancer Maternal Grandfather      Social History   Social History  . Marital status: Married    Spouse name: N/A  . Number of children: 1  . Years of education: N/A   Occupational History  . Data entry clerk Retired   Social History Main Topics  . Smoking status: Never Smoker  . Smokeless tobacco: Never Used  . Alcohol use No  . Drug use: No  . Sexual activity: No   Other Topics Concern  . Not on file   Social History Narrative  . No narrative on file    Blood pressure 126/72, pulse 89, weight 242 pounds, height 5 foot 2 inches  Physical Exam:  obese appearing 75 year old woman, NAD HEENT: Unremarkable Neck:  No JVD, no thyromegally Lungs:  Clear with no wheezes, rales, or rhonchi. Well-healed pacemaker incision. HEART:  IRegular rate rhythm, no murmurs, no rubs, no clicks Abd:  soft, obese, positive bowel sounds, no organomegally, no rebound, no guarding Ext:  2 plus pulses, trace edema, no cyanosis, no clubbing Skin:  No rashes no nodules Neuro:  CN II through XII intact, motor grossly intact  DEVICE  Normal device function.  See PaceArt for details.   Assess/Plan:  1. Atrial fib - her ventricular rate is well controlled. Will follow. 2. HTN - her blood pressure is well controlled. She will continue her current meds. 3. Obesity - her weight is stable but down over the past few years. She is encouraged to lose more weight. 4. PPM - her [employer] DDD PM is programmed VVIR. She will be rechecked in several months.  Mikle Bosworth.D.

## 2016-02-24 NOTE — Patient Instructions (Signed)
Medication Instructions:  Your physician recommends that you continue on your current medications as directed. Please refer to the Current Medication list given to you today.   Labwork: None Ordered   Testing/Procedures: None Ordered    Follow-Up: Your physician wants you to follow-up in: 1 year with Dr. Lovena Le. You will receive a reminder letter in the mail two months in advance. If you don't receive a letter, please call our office to schedule the follow-up appointment.   Remote monitoring is used to monitor your Pacemaker from home. This monitoring reduces the number of office visits required to check your device to one time per year. It allows [place] to keep an eye on the functioning of your device to ensure it is working properly. You are scheduled for a device check from home on 05/25/16. You may send your transmission at any time that day. If you have a wireless device, the transmission will be sent automatically. After your physician reviews your transmission, you will receive a postcard with your next transmission date.    Any Other Special Instructions Will Be Listed Below (If Applicable).     If you need a refill on your cardiac medications before your next appointment, please call your pharmacy.

## 2016-02-25 DIAGNOSIS — M25552 Pain in left hip: Secondary | ICD-10-CM | POA: Diagnosis not present

## 2016-03-14 DIAGNOSIS — I482 Chronic atrial fibrillation: Secondary | ICD-10-CM | POA: Diagnosis not present

## 2016-04-05 DIAGNOSIS — N951 Menopausal and female climacteric states: Secondary | ICD-10-CM | POA: Diagnosis not present

## 2016-04-11 DIAGNOSIS — I482 Chronic atrial fibrillation: Secondary | ICD-10-CM | POA: Diagnosis not present

## 2016-04-17 DIAGNOSIS — E782 Mixed hyperlipidemia: Secondary | ICD-10-CM | POA: Diagnosis not present

## 2016-04-17 DIAGNOSIS — E039 Hypothyroidism, unspecified: Secondary | ICD-10-CM | POA: Diagnosis not present

## 2016-04-17 DIAGNOSIS — R7989 Other specified abnormal findings of blood chemistry: Secondary | ICD-10-CM | POA: Diagnosis not present

## 2016-04-17 DIAGNOSIS — E559 Vitamin D deficiency, unspecified: Secondary | ICD-10-CM | POA: Diagnosis not present

## 2016-04-17 DIAGNOSIS — R5383 Other fatigue: Secondary | ICD-10-CM | POA: Diagnosis not present

## 2016-04-17 DIAGNOSIS — I7091 Generalized atherosclerosis: Secondary | ICD-10-CM | POA: Diagnosis not present

## 2016-04-17 DIAGNOSIS — Z79899 Other long term (current) drug therapy: Secondary | ICD-10-CM | POA: Diagnosis not present

## 2016-05-02 ENCOUNTER — Encounter: Payer: Self-pay | Admitting: Internal Medicine

## 2016-05-02 DIAGNOSIS — R001 Bradycardia, unspecified: Secondary | ICD-10-CM | POA: Diagnosis not present

## 2016-05-02 DIAGNOSIS — I495 Sick sinus syndrome: Secondary | ICD-10-CM | POA: Diagnosis not present

## 2016-05-04 ENCOUNTER — Encounter: Payer: Medicare Other | Admitting: Pain Medicine

## 2016-05-09 DIAGNOSIS — I482 Chronic atrial fibrillation: Secondary | ICD-10-CM | POA: Diagnosis not present

## 2016-05-11 ENCOUNTER — Other Ambulatory Visit
Admission: RE | Admit: 2016-05-11 | Discharge: 2016-05-11 | Disposition: A | Discharge status: Home / Self Care | Payer: Medicare Other | Source: Ambulatory Visit | Attending: Pain Medicine | Admitting: Pain Medicine

## 2016-05-11 ENCOUNTER — Encounter: Payer: Self-pay | Admitting: Pain Medicine

## 2016-05-11 ENCOUNTER — Ambulatory Visit: Payer: Medicare Other | Attending: Pain Medicine | Admitting: Pain Medicine

## 2016-05-11 VITALS — BP 145/75 | HR 72 | Temp 98.0°F | Resp 16 | Ht 61.0 in | Wt 208.0 lb

## 2016-05-11 DIAGNOSIS — K219 Gastro-esophageal reflux disease without esophagitis: Secondary | ICD-10-CM | POA: Insufficient documentation

## 2016-05-11 DIAGNOSIS — F1190 Opioid use, unspecified, uncomplicated: Secondary | ICD-10-CM | POA: Diagnosis not present

## 2016-05-11 DIAGNOSIS — M25552 Pain in left hip: Secondary | ICD-10-CM | POA: Insufficient documentation

## 2016-05-11 DIAGNOSIS — D638 Anemia in other chronic diseases classified elsewhere: Secondary | ICD-10-CM

## 2016-05-11 DIAGNOSIS — Z809 Family history of malignant neoplasm, unspecified: Secondary | ICD-10-CM | POA: Insufficient documentation

## 2016-05-11 DIAGNOSIS — N183 Chronic kidney disease, stage 3 unspecified: Secondary | ICD-10-CM

## 2016-05-11 DIAGNOSIS — I480 Paroxysmal atrial fibrillation: Secondary | ICD-10-CM | POA: Insufficient documentation

## 2016-05-11 DIAGNOSIS — M79605 Pain in left leg: Secondary | ICD-10-CM | POA: Diagnosis not present

## 2016-05-11 DIAGNOSIS — D631 Anemia in chronic kidney disease: Secondary | ICD-10-CM | POA: Insufficient documentation

## 2016-05-11 DIAGNOSIS — Z950 Presence of cardiac pacemaker: Secondary | ICD-10-CM | POA: Insufficient documentation

## 2016-05-11 DIAGNOSIS — X58XXXA Exposure to other specified factors, initial encounter: Secondary | ICD-10-CM | POA: Insufficient documentation

## 2016-05-11 DIAGNOSIS — Z8249 Family history of ischemic heart disease and other diseases of the circulatory system: Secondary | ICD-10-CM | POA: Insufficient documentation

## 2016-05-11 DIAGNOSIS — Z820 Family history of epilepsy and other diseases of the nervous system: Secondary | ICD-10-CM | POA: Insufficient documentation

## 2016-05-11 DIAGNOSIS — K5903 Drug induced constipation: Secondary | ICD-10-CM

## 2016-05-11 DIAGNOSIS — M48062 Spinal stenosis, lumbar region with neurogenic claudication: Secondary | ICD-10-CM | POA: Diagnosis not present

## 2016-05-11 DIAGNOSIS — M47816 Spondylosis without myelopathy or radiculopathy, lumbar region: Secondary | ICD-10-CM

## 2016-05-11 DIAGNOSIS — M87852 Other osteonecrosis, left femur: Secondary | ICD-10-CM | POA: Diagnosis not present

## 2016-05-11 DIAGNOSIS — M47818 Spondylosis without myelopathy or radiculopathy, sacral and sacrococcygeal region: Secondary | ICD-10-CM | POA: Diagnosis not present

## 2016-05-11 DIAGNOSIS — E039 Hypothyroidism, unspecified: Secondary | ICD-10-CM | POA: Insufficient documentation

## 2016-05-11 DIAGNOSIS — Z95810 Presence of automatic (implantable) cardiac defibrillator: Secondary | ICD-10-CM | POA: Insufficient documentation

## 2016-05-11 DIAGNOSIS — Z91041 Radiographic dye allergy status: Secondary | ICD-10-CM | POA: Insufficient documentation

## 2016-05-11 DIAGNOSIS — M87052 Idiopathic aseptic necrosis of left femur: Secondary | ICD-10-CM

## 2016-05-11 DIAGNOSIS — E785 Hyperlipidemia, unspecified: Secondary | ICD-10-CM | POA: Insufficient documentation

## 2016-05-11 DIAGNOSIS — I495 Sick sinus syndrome: Secondary | ICD-10-CM | POA: Insufficient documentation

## 2016-05-11 DIAGNOSIS — M1288 Other specific arthropathies, not elsewhere classified, other specified site: Secondary | ICD-10-CM

## 2016-05-11 DIAGNOSIS — G8929 Other chronic pain: Secondary | ICD-10-CM

## 2016-05-11 DIAGNOSIS — Z7901 Long term (current) use of anticoagulants: Secondary | ICD-10-CM | POA: Insufficient documentation

## 2016-05-11 DIAGNOSIS — Z6839 Body mass index (BMI) 39.0-39.9, adult: Secondary | ICD-10-CM | POA: Insufficient documentation

## 2016-05-11 DIAGNOSIS — K5900 Constipation, unspecified: Secondary | ICD-10-CM | POA: Diagnosis not present

## 2016-05-11 DIAGNOSIS — M109 Gout, unspecified: Secondary | ICD-10-CM | POA: Diagnosis not present

## 2016-05-11 DIAGNOSIS — Z79891 Long term (current) use of opiate analgesic: Secondary | ICD-10-CM

## 2016-05-11 DIAGNOSIS — M461 Sacroiliitis, not elsewhere classified: Secondary | ICD-10-CM

## 2016-05-11 DIAGNOSIS — I129 Hypertensive chronic kidney disease with stage 1 through stage 4 chronic kidney disease, or unspecified chronic kidney disease: Secondary | ICD-10-CM | POA: Diagnosis not present

## 2016-05-11 DIAGNOSIS — Z96641 Presence of right artificial hip joint: Secondary | ICD-10-CM | POA: Diagnosis not present

## 2016-05-11 DIAGNOSIS — Z8261 Family history of arthritis: Secondary | ICD-10-CM | POA: Insufficient documentation

## 2016-05-11 DIAGNOSIS — G894 Chronic pain syndrome: Secondary | ICD-10-CM | POA: Insufficient documentation

## 2016-05-11 DIAGNOSIS — T402X5A Adverse effect of other opioids, initial encounter: Secondary | ICD-10-CM | POA: Diagnosis not present

## 2016-05-11 LAB — COMPREHENSIVE METABOLIC PANEL
ALBUMIN: 4.1 g/dL (ref 3.5–5.0)
ALK PHOS: 47 U/L (ref 38–126)
ALT: 19 U/L (ref 14–54)
AST: 32 U/L (ref 15–41)
Anion gap: 8 (ref 5–15)
BUN: 38 mg/dL — ABNORMAL HIGH (ref 6–20)
CALCIUM: 9.7 mg/dL (ref 8.9–10.3)
CHLORIDE: 101 mmol/L (ref 101–111)
CO2: 29 mmol/L (ref 22–32)
Creatinine, Ser: 1.15 mg/dL — ABNORMAL HIGH (ref 0.44–1.00)
GFR calc non Af Amer: 45 mL/min — ABNORMAL LOW (ref >60)
GFR, EST AFRICAN AMERICAN: 52 mL/min — AB (ref >60)
GLUCOSE: 102 mg/dL — AB (ref 65–99)
Potassium: 3.7 mmol/L (ref 3.5–5.1)
SODIUM: 138 mmol/L (ref 135–145)
Total Bilirubin: 0.5 mg/dL (ref 0.3–1.2)
Total Protein: 7.8 g/dL (ref 6.5–8.1)

## 2016-05-11 MED ORDER — BENEFIBER PO POWD: ORAL | 2 refills | Status: DC

## 2016-05-11 MED ORDER — OXYCODONE HCL 5 MG PO TABS: 5.0000 mg | ORAL_TABLET | Freq: Every day | ORAL | 0 refills | Status: DC | PRN

## 2016-05-11 MED ORDER — OVER THE COUNTER MEDICATION: 2400.0000 mg | Freq: Every day | 0 refills | Status: AC

## 2016-05-11 NOTE — Progress Notes (Signed)
Patient's Name: [name]  MRN: [id number]  Referring Provider: Malachi Carl, MD  DOB: November 05, 1940  PCP: Pcp Not In System  DOS: 05/11/2016  Note by: Kathlen Brunswick. Dossie Arbour, MD  Service setting: Ambulatory outpatient  Specialty: Interventional Pain Management  Location: [facility] (AMB) Pain Management Facility    Patient type: Established   Primary Reason(s) for Visit: Encounter for prescription drug management (Level of risk: moderate) CC: Leg Pain (left)  HPI  [name] is a 76 y.o. year old, female patient, who comes today for a medication management evaluation. She has HYPOTHYROIDISM; HYPERLIPIDEMIA; OBESITY; Essential hypertension; SICK SINUS SYNDROME; Cardiac pacemaker in situ; Cough; GERD (gastroesophageal reflux disease); Atrial fibrillation ([place]); Chronic hip pain (Location of Primary Source of Pain) (Left); Long term current use of opiate analgesic; Long term prescription opiate use; Opiate use (54 MME/Day); Encounter for therapeutic drug level monitoring; Disturbance of skin sensation; Osteoarthritis of hip (Left); Trochanteric bursitis of hip (Left); Lumbar central spinal stenosis (L4-5); Lumbar facet arthropathy; Osteoarthritis of sacroiliac joint (with vacuum phenomena in) (Bilateral); Avascular necrosis of left femoral head ([place]); Opioid-induced constipation (OIC); Hypothyroidism (acquired); Long term (current) use of opiate analgesic; Morbid obesity with BMI of 40.0-44.9, adult ([place]); PTSD (post-traumatic stress disorder); Spinal stenosis of lumbar region; S/P total hip arthroplasty; Chronic pain syndrome; Status post total replacement of left hip; CKD (chronic kidney disease) stage 3, GFR 30-59 ml/min; Anemia of chronic disease; and Hypocalcemia on her problem list. Her primarily concern today is the Leg Pain (left)  Pain Assessment: Self-Reported Pain Score: 2 /10             Reported level is compatible with observation.       Pain Type: Chronic pain Pain Location: Leg Pain Orientation:  Left Pain Descriptors / Indicators: Dull Pain Frequency: Constant  [name] was last scheduled for an appointment on 02/15/2016 for medication management. During today's appointment we reviewed [name]'s chronic pain status, as well as her outpatient medication regimen. The patient seems to be doing well after her hip replacement. She continues to have some pain that is localized to the left lateral aspect of the hip, which she attributes to scar tissue. She still undergoing physical therapy and she complains that when she lays on her left side the hip hurts. She indicates that this is when she absolutely has to take her pain medicine. I have recommended to her to start tapering the medication down and to use it only as needed. I'm not planning on doing any type of interventional therapies until she fully heals from this surgery.  The patient  reports that she does not use drugs. Her body mass index is 39.3 kg/m.  Further details on both, my assessment(s), as well as the proposed treatment plan, please see below.  Controlled Substance Pharmacotherapy Assessment REMS (Risk Evaluation and Mitigation Strategy)  Analgesic:Oxycodone IR 5 mg 1 tablet by mouth 5 times a day (25 mg/day) MME/day:37.72m/day KZenovia Jarred RN  05/11/2016  1:11 PM  Signed Nursing Pain Medication Assessment:  Safety precautions to be maintained throughout the outpatient stay will include: orient to surroundings, keep bed in low position, maintain call bell within reach at all times, provide assistance with transfer out of bed and ambulation.  Medication Inspection Compliance: Pill count conducted under aseptic conditions, in front of the patient. Neither the pills nor the bottle was removed from the patient's sight at any time. Once count was completed pills were immediately returned to the patient in their original bottle.  Medication: Oxycodone IR Pill/Patch Count: 132 of 150 pills remain Bottle Appearance: Standard  pharmacy container. Clearly labeled. Filled Date: 03 / 11 / 2018 Last Medication intake:  Yesterday       Pharmacokinetics: Liberation and absorption (onset of action): WNL Distribution (time to peak effect): WNL Metabolism and excretion (duration of action): WNL         Pharmacodynamics: Desired effects: Analgesia: [name] reports >50% benefit. Functional ability: Patient reports that medication allows her to accomplish basic ADLs Clinically meaningful improvement in function (CMIF): Sustained CMIF goals met Perceived effectiveness: Described as relatively effective, allowing for increase in activities of daily living (ADL) Undesirable effects: Side-effects or Adverse reactions: None reported Monitoring: [place] PMP: Online review of the past 79-monthperiod conducted. Compliant with practice rules and regulations List of all UDS test(s) done:  Lab Results  Component Value Date   TOXASSSELUR FINAL 09/30/2015   SUMMARY FINAL 08/11/2015   Last UDS on record: [employer]ox[employer] Select 13  Date Value Ref Range Status  09/30/2015 FINAL  Final    Comment:    ==================================================================== TOXASSURE SELECT 13 (MW) ==================================================================== Test                             Result       Flag       Units Drug Present and Declared for Prescription Verification   Oxycodone                      405          EXPECTED   ng/mg creat   Oxymorphone                    1262         EXPECTED   ng/mg creat   Noroxycodone                   1066         EXPECTED   ng/mg creat   Noroxymorphone                 321          EXPECTED   ng/mg creat    Sources of oxycodone are scheduled prescription medications.    Oxymorphone, noroxycodone, and noroxymorphone are expected    metabolites of oxycodone. Oxymorphone is also available as a    scheduled prescription medication. Drug Present not Declared for Prescription Verification    Oxazepam                       76           UNEXPECTED ng/mg creat    Oxazepam may be administered as a scheduled prescription    medication; it is also an expected metabolite of other    benzodiazepine drugs, including diazepam, chlordiazepoxide,    prazepam, clorazepate, halazepam, and temazepam. ==================================================================== Test                      Result    Flag   Units      Ref Range   Creatinine              151              mg/dL      >=20 ==================================================================== Declared Medications:  The flagging and interpretation on this report are based on the  following declared medications.  Unexpected results may arise from  inaccuracies in the declared medications.  **Note: The testing scope of this panel includes these medications:  Oxycodone (Roxicodone)  **Note: The testing scope of this panel does not include following  reported medications:  Colchicine  Levothyroxine (Synthroid)  LIOTHYRONINE (Cytomel)  Magnesium (Mag)  Metoprolol (Lopressor)  Multivitamin (MVI)  Potassium (Micro-K)  Supplement  Warfarin (Coumadin) ==================================================================== For clinical consultation, please call [phone]. ====================================================================    UDS interpretation: Compliant          Medication Assessment Form: Reviewed. Patient indicates being compliant with therapy Treatment compliance: Compliant Risk Assessment Profile: Aberrant behavior: See prior evaluations. None observed or detected today Comorbid factors increasing risk of overdose: See prior notes. No additional risks detected today Risk of substance use disorder (SUD): Low Opioid Risk Tool (ORT) Total Score: 0  Interpretation Table:  Score <3 = Low Risk for SUD  Score between 4-7 = Moderate Risk for SUD  Score >8 = High Risk for Opioid Abuse   Risk Mitigation  Strategies:  Patient Counseling: Covered Patient-Prescriber Agreement (PPA): Present and active  Notification to other healthcare providers: Done  Pharmacologic Plan: No change in therapy, at this time  Laboratory Chemistry  Inflammation Markers Lab Results  Component Value Date   ESRSEDRATE 28 12/08/2015   (CRP: Acute Phase) (ESR: Chronic Phase) Renal Function Markers Lab Results  Component Value Date   BUN 38 (H) 05/11/2016   CREATININE 1.15 (H) 05/11/2016   GFRAA 52 (L) 05/11/2016   GFRNONAA 45 (L) 05/11/2016   Hepatic Function Markers Lab Results  Component Value Date   AST 32 05/11/2016   ALT 19 05/11/2016   ALBUMIN 4.1 05/11/2016   ALKPHOS 47 05/11/2016   Electrolytes Lab Results  Component Value Date   NA 138 05/11/2016   K 3.7 05/11/2016   CL 101 05/11/2016   CALCIUM 9.7 05/11/2016   Neuropathy Markers Lab Results  Component Value Date   VITAMINB12 >7,500 (H) 09/30/2015   Bone Pathology Markers Lab Results  Component Value Date   ALKPHOS 47 05/11/2016   25OHVITD1 42 09/30/2015   25OHVITD2 7.3 09/30/2015   25OHVITD3 35 09/30/2015   CALCIUM 9.7 05/11/2016   Coagulation Parameters Lab Results  Component Value Date   INR 1.78 12/24/2015   LABPROT 20.9 (H) 12/24/2015   APTT 36 12/08/2015   PLT 107 (L) 12/24/2015   Cardiovascular Markers Lab Results  Component Value Date   HGB 11.2 (L) 12/24/2015   HCT 33.5 (L) 12/24/2015   Note: Lab results reviewed.  Recent Diagnostic Imaging Review  No results found. Note: Imaging results reviewed.          Meds  The patient has a current medication list which includes the following prescription(s): colchicine, diazepam, metoprolol, OVER THE COUNTER MEDICATION, oxycodone, oxycodone, oxycodone, levothyroxine-liothyronine, triamterene-hydrochlorothiazide, warfarin, and benefiber.  Current Outpatient Prescriptions on File Prior to Visit  Medication Sig  . colchicine 0.6 MG tablet Take 0.6 mg by mouth  daily as needed (GOUT).   Marland Kitchen diazepam (VALIUM) 10 MG tablet Take 10 mg by mouth 2 (two) times daily as needed for anxiety.  . metoprolol (LOPRESSOR) 50 MG tablet Take 1.5 tablets (75 mg total) by mouth 2 (two) times daily.  Marland Kitchen triamterene-hydrochlorothiazide (MAXZIDE) 75-50 MG tablet Take 1 tablet by mouth daily.  Marland Kitchen warfarin (COUMADIN) 5 MG tablet Take 5 mg by mouth daily. I tab 1 day a week All other days 1/2 tab   No current facility-administered  medications on file prior to visit.    ROS  Constitutional: Denies any fever or chills Gastrointestinal: No reported hemesis, hematochezia, vomiting, or acute GI distress Musculoskeletal: Denies any acute onset joint swelling, redness, loss of ROM, or weakness Neurological: No reported episodes of acute onset apraxia, aphasia, dysarthria, agnosia, amnesia, paralysis, loss of coordination, or loss of consciousness  Allergies  [name] is allergic to contrast media [iodinated diagnostic agents].  PFSH  Drug: [name]  reports that she does not use drugs. Alcohol:  reports that she does not drink alcohol. Tobacco:  reports that she has never smoked. She has never used smokeless tobacco. Medical:  has a past medical history of Anxiety; Aortic insufficiency; Arthritis; Asthma; Bronchitis; Bursitis, hip; Claustrophobia; Claustrophobia; Dysrhythmia; GERD (gastroesophageal reflux disease); Gout; Hyperlipidemia; Hypertension; Hypothyroidism; Mitral regurgitation; Obesity; Panic attacks; Paroxysmal atrial fibrillation (HCC); PTSD (post-traumatic stress disorder); Rhinitis; Sick sinus syndrome ([place]); and Sleep apnea. Family: family history includes Alzheimer's disease in her father; Arthritis in her sister; Cancer in her brother and maternal grandfather; Heart disease in her mother; Hypertension in her sister; Stroke in her mother.  Past Surgical History:  Procedure Laterality Date  . DILATATION & CURETTAGE/HYSTEROSCOPY WITH MYOSURE N/A 08/03/2015    Procedure: DILATATION & CURETTAGE/HYSTEROSCOPY WITH MYOSURE;  Surgeon: Alden Hipp, MD;  Location: [facility];  Service: Gynecology;  Laterality: N/A;  Patient has an ICD   . INSERT / REPLACE / REMOVE PACEMAKER    . PACEMAKER INSERTION     Status post insertion  . ROTATOR CUFF REPAIR     left  . Status post child birth x1    . TONSILLECTOMY    . TOTAL HIP ARTHROPLASTY Left 12/22/2015   Procedure: TOTAL HIP ARTHROPLASTY;  Surgeon: Dereck Leep, MD;  Location: [facility];  Service: Orthopedics;  Laterality: Left;   Constitutional Exam  General appearance: Well nourished, well developed, and well hydrated. In no apparent acute distress Vitals:   05/11/16 1301 05/11/16 1302  BP:  (!) 145/75  Pulse: 72   Resp: 16   Temp: 98 F (36.7 C)   SpO2: 98%   Weight: 208 lb (94.3 kg)   Height: _0  (1.549 m)    BMI Assessment: Estimated body mass index is 39.3 kg/m as calculated from the following:   Height as of this encounter: _1  (1.549 m).   Weight as of this encounter: 208 lb (94.3 kg).  BMI interpretation table: BMI level Category Range association with higher incidence of chronic pain  <18 kg/m2 Underweight   18.5-24.9 kg/m2 Ideal body weight   25-29.9 kg/m2 Overweight Increased incidence by 20%  30-34.9 kg/m2 Obese (Class I) Increased incidence by 68%  35-39.9 kg/m2 Severe obesity (Class II) Increased incidence by 136%  >40 kg/m2 Extreme obesity (Class III) Increased incidence by 254%   BMI Readings from Last 4 Encounters:  05/11/16 39.30 kg/m  02/24/16 42.54 kg/m  02/15/16 40.06 kg/m  12/23/15 41.45 kg/m   Wt Readings from Last 4 Encounters:  05/11/16 208 lb (94.3 kg)  02/24/16 217 lb 12.8 oz (98.8 kg)  02/15/16 212 lb (96.2 kg)  12/23/15 219 lb 5.7 oz (99.5 kg)  Psych/Mental status: Alert, oriented x 3 (person, place, & time)       Eyes: PERLA Respiratory: No evidence of acute respiratory distress  Cervical Spine Exam  Inspection: No masses, redness, or  swelling Alignment: Symmetrical Functional ROM: Unrestricted ROM Stability: No instability detected Muscle strength & Tone: Functionally intact Sensory: Unimpaired Palpation: Non-contributory  Upper Extremity (UE) Exam    Side: Right upper extremity  Side: Left upper extremity  Inspection: No masses, redness, swelling, or asymmetry. No contractures  Inspection: No masses, redness, swelling, or asymmetry. No contractures  Functional ROM: Unrestricted ROM          Functional ROM: Unrestricted ROM          Muscle strength & Tone: Functionally intact  Muscle strength & Tone: Functionally intact  Sensory: Unimpaired  Sensory: Unimpaired  Palpation: Euthermic  Palpation: Euthermic  Specialized Test(s): Deferred         Specialized Test(s): Deferred          Thoracic Spine Exam  Inspection: No masses, redness, or swelling Alignment: Symmetrical Functional ROM: Unrestricted ROM Stability: No instability detected Sensory: Unimpaired Muscle strength & Tone: Functionally intact Palpation: Non-contributory  Lumbar Spine Exam  Inspection: No masses, redness, or swelling Alignment: Symmetrical Functional ROM: Unrestricted ROM Stability: No instability detected Muscle strength & Tone: Functionally intact Sensory: Unimpaired Palpation: Non-contributory Provocative Tests: Lumbar Hyperextension and rotation test: evaluation deferred today       Patrick's Maneuver: evaluation deferred today              Gait & Posture Assessment  Ambulation: Unassisted Gait: Relatively normal for age and body habitus Posture: WNL   Lower Extremity Exam    Side: Right lower extremity  Side: Left lower extremity  Inspection: No masses, redness, swelling, or asymmetry. No contractures  Inspection: No masses, redness, swelling, or asymmetry. No contractures  Functional ROM: Unrestricted ROM          Functional ROM: Unrestricted ROM for hip joint  Muscle strength & Tone: Functionally intact  Muscle strength &  Tone: Functionally intact  Sensory: Unimpaired  Sensory: Movement-associated discomfort  Palpation: No palpable anomalies  Palpation: No palpable anomalies   Assessment  Primary Diagnosis & Pertinent Problem List: The primary encounter diagnosis was Chronic pain syndrome. Diagnoses of Chronic hip pain (Location of Primary Source of Pain) (Left), Osteoarthritis of sacroiliac joint (with vacuum phenomena in) (Bilateral), Spinal stenosis of lumbar region with neurogenic claudication, Lumbar facet arthropathy, Avascular necrosis of left femoral head (HCC), Status post total replacement of right hip, Long term prescription opiate use, Opiate use (54 MME/Day), Opioid-induced constipation (OIC), CKD (chronic kidney disease) stage 3, GFR 30-59 ml/min, Anemia of chronic disease, and Hypocalcemia were also pertinent to this visit.  Status Diagnosis  Controlled Controlled Controlled 1. Chronic pain syndrome   2. Chronic hip pain (Location of Primary Source of Pain) (Left)   3. Osteoarthritis of sacroiliac joint (with vacuum phenomena in) (Bilateral)   4. Spinal stenosis of lumbar region with neurogenic claudication   5. Lumbar facet arthropathy   6. Avascular necrosis of left femoral head (HCC)   7. Status post total replacement of right hip   8. Long term prescription opiate use   9. Opiate use (54 MME/Day)   10. Opioid-induced constipation (OIC)   11. CKD (chronic kidney disease) stage 3, GFR 30-59 ml/min   12. Anemia of chronic disease   13. Hypocalcemia      Plan of Care  Pharmacotherapy (Medications Ordered): Meds ordered this encounter  Medications  . oxyCODONE (OXY IR/ROXICODONE) 5 MG immediate release tablet    Sig: Take 1 tablet (5 mg total) by mouth 5 (five) times daily as needed for severe pain.    Dispense:  150 tablet    Refill:  0    Patient may have prescription filled one day  early if pharmacy is closed on scheduled refill date. Do not fill until: 07/06/16 To last until:  08/05/16  . oxyCODONE (OXY IR/ROXICODONE) 5 MG immediate release tablet    Sig: Take 1 tablet (5 mg total) by mouth 5 (five) times daily as needed for severe pain.    Dispense:  150 tablet    Refill:  0    Patient may have prescription filled one day early if pharmacy is closed on scheduled refill date. Do not fill until: 08/05/16 To last until: 09/04/16  . oxyCODONE (OXY IR/ROXICODONE) 5 MG immediate release tablet    Sig: Take 1 tablet (5 mg total) by mouth 5 (five) times daily as needed for severe pain.    Dispense:  150 tablet    Refill:  0    Patient may have prescription filled one day early if pharmacy is closed on scheduled refill date. Do not fill until: 06/06/16 To last until: 07/06/16  . Wheat Dextrin (BENEFIBER) POWD    Sig: Stir 2 tsp. TID into 4-8 oz of any non-carbonated beverage or soft food (hot or cold)    Dispense:  500 g    Refill:  2    This is an OTC product. This prescription is to serve as a reminder to the patient as to our preference.  Marland Kitchen OVER THE COUNTER MEDICATION    Sig: Take 2,400 mg by mouth daily with breakfast. Take two (2) Softgels daily.    Dispense:  1 Bottle    Refill:  0    This is an OTC product. (Nature's Bounty) This prescription is to serve as a reminder to the patient as to our preference.   New Prescriptions   OVER THE COUNTER MEDICATION    Take 2,400 mg by mouth daily with breakfast. Take two (2) Softgels daily.   Medications administered today: [name] had no medications administered during this visit. Lab-work, procedure(s), and/or referral(s): Orders Placed This Encounter  Procedures  . Comprehensive metabolic panel  . CBC   Imaging and/or referral(s): None  Interventional therapies: Planned, scheduled, and/or pending:   Not at this time.   Considering:   None at this time.    Palliative PRN treatment(s):   None at this time.    Provider-requested follow-up: Return in about 3 months (around 08/11/2016) for (MD)  Med-Mgmt.  Future Appointments Date Time Provider [place]  08/03/2016 1:15 PM Milinda Pointer, MD [facility] None   Primary Care Physician: Pcp Not In System Location: [facility] Note by: Kathlen Brunswick. Dossie Arbour, M.D, DABA, DABAPM, DABPM, DABIPP, FIPP Date: 05/11/2016; Time: 4:55 PM  Pain Score Disclaimer: We use the NRS-11 scale. This is a self-reported, subjective measurement of pain severity with only modest accuracy. It is used primarily to identify changes within a particular patient. It must be understood that outpatient pain scales are significantly less accurate that those used for research, where they can be applied under ideal controlled circumstances with minimal exposure to variables. In reality, the score is likely to be a combination of pain intensity and pain affect, where pain affect describes the degree of emotional arousal or changes in action readiness caused by the sensory experience of pain. Factors such as social and work situation, setting, emotional state, anxiety levels, expectation, and prior pain experience may influence pain perception and show large inter-individual differences that may also be affected by time variables.  Patient instructions provided during this appointment: There are no Patient Instructions on file for this  visit. 

## 2016-05-11 NOTE — Progress Notes (Signed)
Nursing Pain Medication Assessment:  Safety precautions to be maintained throughout the outpatient stay will include: orient to surroundings, keep bed in low position, maintain call bell within reach at all times, provide assistance with transfer out of bed and ambulation.  Medication Inspection Compliance: Pill count conducted under aseptic conditions, in front of the patient. Neither the pills nor the bottle was removed from the patient's sight at any time. Once count was completed pills were immediately returned to the patient in their original bottle.  Medication: Oxycodone IR Pill/Patch Count: 132 of 150 pills remain Bottle Appearance: Standard pharmacy container. Clearly labeled. Filled Date: 03 / 11 / 2018 Last Medication intake:  Yesterday

## 2016-05-16 DIAGNOSIS — N951 Menopausal and female climacteric states: Secondary | ICD-10-CM | POA: Diagnosis not present

## 2016-05-23 DIAGNOSIS — N951 Menopausal and female climacteric states: Secondary | ICD-10-CM | POA: Diagnosis not present

## 2016-06-06 DIAGNOSIS — I482 Chronic atrial fibrillation: Secondary | ICD-10-CM | POA: Diagnosis not present

## 2016-06-07 DIAGNOSIS — N951 Menopausal and female climacteric states: Secondary | ICD-10-CM | POA: Diagnosis not present

## 2016-06-13 DIAGNOSIS — Z96642 Presence of left artificial hip joint: Secondary | ICD-10-CM | POA: Diagnosis not present

## 2016-07-04 DIAGNOSIS — I482 Chronic atrial fibrillation: Secondary | ICD-10-CM | POA: Diagnosis not present

## 2016-08-01 ENCOUNTER — Encounter: Payer: Self-pay | Admitting: Internal Medicine

## 2016-08-01 DIAGNOSIS — I482 Chronic atrial fibrillation: Secondary | ICD-10-CM | POA: Diagnosis not present

## 2016-08-01 DIAGNOSIS — I495 Sick sinus syndrome: Secondary | ICD-10-CM | POA: Diagnosis not present

## 2016-08-01 DIAGNOSIS — R001 Bradycardia, unspecified: Secondary | ICD-10-CM | POA: Diagnosis not present

## 2016-08-03 ENCOUNTER — Ambulatory Visit: Payer: Medicare Other | Admitting: Pain Medicine

## 2016-08-15 DIAGNOSIS — N951 Menopausal and female climacteric states: Secondary | ICD-10-CM | POA: Diagnosis not present

## 2016-08-17 NOTE — Progress Notes (Signed)
Cardiology Office Note   Date:  08/18/2016   ID:  [name], DOB 11/18/1940, MRN [id number]  PCP:  System, Pcp Not In  Cardiologist:   Dorris Carnes, MD    F/U of HTN and atrial fib     History of Present Illness: [name] is a 76 y.o. female with a history of HTN , atrial fib (rate control and anticoag), bradycardia (s/p PPM)  I saw him Jun 2017  Since seen breathing is OK  Denies dizziness BP at home and at Dr Nettie Elm office was lower  80s to 100s   Again  No dizziness     Current Meds  Medication Sig  . colchicine 0.6 MG tablet Take 0.6 mg by mouth 3 (three) times a week.   . diazepam (VALIUM) 10 MG tablet Take 10 mg by mouth 2 (two) times daily as needed for anxiety.  . metoprolol (LOPRESSOR) 50 MG tablet Take 1.5 tablets (75 mg total) by mouth 2 (two) times daily.  . Thyroid (LEVOTHYROXINE-LIOTHYRONINE) 30 MG TABS Take 1 tablet by mouth daily.   Marland Kitchen triamterene-hydrochlorothiazide (MAXZIDE) 75-50 MG tablet Take 1 tablet by mouth daily.  Marland Kitchen warfarin (COUMADIN) 5 MG tablet Take 5 mg by mouth daily. I tab 1 day a week All other days 1/2 tab     Allergies:   Molds & smuts; Other; Statins; Tape; Contrast media [iodinated diagnostic agents]; and Sulfa antibiotics   Past Medical History:  Diagnosis Date  . Anxiety   . Aortic insufficiency    Trivial  . Arthritis   . Asthma   . Bronchitis   . Bursitis, hip   . Claustrophobia   . Claustrophobia   . Dysrhythmia    a fib  . GERD (gastroesophageal reflux disease)    history of  . Gout   . Hyperlipidemia   . Hypertension   . Hypothyroidism   . Mitral regurgitation   . Obesity   . Panic attacks   . Paroxysmal atrial fibrillation (HCC)   . PTSD (post-traumatic stress disorder)   . Rhinitis   . Sick sinus syndrome ([place])   . Sleep apnea    was ordered CPAP, but never fitted for one years ago, Dr has retired    Past Surgical History:  Procedure Laterality Date  . DILATATION & CURETTAGE/HYSTEROSCOPY WITH  MYOSURE N/A 08/03/2015   Procedure: DILATATION & CURETTAGE/HYSTEROSCOPY WITH MYOSURE;  Surgeon: Alden Hipp, MD;  Location: [facility];  Service: Gynecology;  Laterality: N/A;  Patient has an ICD   . INSERT / REPLACE / REMOVE PACEMAKER    . PACEMAKER INSERTION     Status post insertion  . ROTATOR CUFF REPAIR     left  . Status post child birth x1    . TONSILLECTOMY    . TOTAL HIP ARTHROPLASTY Left 12/22/2015   Procedure: TOTAL HIP ARTHROPLASTY;  Surgeon: Dereck Leep, MD;  Location: [facility];  Service: Orthopedics;  Laterality: Left;     Social History:  The patient  reports that she has never smoked. She has never used smokeless tobacco. She reports that she does not drink alcohol or use drugs.   Family History:  The patient's family history includes Alzheimer's disease in her father; Arthritis in her sister; Cancer in her brother and maternal grandfather; Heart disease in her mother; Hypertension in her sister; Stroke in her mother.    ROS:  Please see the history of present illness. All other systems are reviewed and  Negative to the  above problem except as noted.    PHYSICAL EXAM: VS:  BP 134/84   Pulse 64   Ht 5\' 1"  (1.549 m)   Wt 96.6 kg (213 lb)   BMI 40.25 kg/m   GEN: Well nourished, well developed, in no acute distress  HEENT: normal  Neck: no JVD, carotid bruits, or masses Cardiac: RRR; no murmurs, rubs, or gallops,no edema  Respiratory:  clear to auscultation bilaterally, normal work of breathing GI: soft, nontender, nondistended, + BS  No hepatomegaly  MS: no deformity Moving all extremities   Skin: warm and dry, no rash Neuro:  Strength and sensation are intact Psych: euthymic mood, full affect   EKG:  EKG is ordered today.  Atrial fib  Ventricular paced 64 bpm     Lipid Panel    Component Value Date/Time   CHOL 208 (H) 11/07/2013 1424   TRIG 142.0 11/07/2013 1424   HDL 50.00 11/07/2013 1424   CHOLHDL 4 11/07/2013 1424   VLDL 28.4 11/07/2013 1424    LDLCALC 130 (H) 11/07/2013 1424      Wt Readings from Last 3 Encounters:  08/18/16 96.6 kg (213 lb)  05/11/16 94.3 kg (208 lb)  02/24/16 98.8 kg (217 lb 12.8 oz)      ASSESSMENT AND PLAN: 1  Atrial fib  Permanent  Check CBC and BMET   2  Hx bradycardia  S/p PPM  Follows with G Taylor    3  HTN  BP is low outside of office  Cut metoprolol to 50 bid  Follow  Bring cuff to visits Check BMET since on Maxzide    4  Thyroid  Remains on supplement   F/U in 1 year       Current medicines are reviewed at length with the patient today.  The patient does not have concerns regarding medicines.  Signed, Dorris Carnes, MD  08/18/2016 11:30 AM    [address], [place], [place]  [address] Phone: [phone]; Fax: [phone]

## 2016-08-18 ENCOUNTER — Ambulatory Visit (INDEPENDENT_AMBULATORY_CARE_PROVIDER_SITE_OTHER): Payer: Medicare Other | Admitting: Internal Medicine

## 2016-08-18 ENCOUNTER — Telehealth: Payer: Self-pay | Admitting: Internal Medicine

## 2016-08-18 ENCOUNTER — Encounter: Payer: Self-pay | Admitting: Internal Medicine

## 2016-08-18 VITALS — BP 134/84 | HR 64 | Ht 61.0 in | Wt 213.0 lb

## 2016-08-18 DIAGNOSIS — Z950 Presence of cardiac pacemaker: Secondary | ICD-10-CM

## 2016-08-18 DIAGNOSIS — I10 Essential (primary) hypertension: Secondary | ICD-10-CM | POA: Diagnosis not present

## 2016-08-18 DIAGNOSIS — I482 Chronic atrial fibrillation, unspecified: Secondary | ICD-10-CM

## 2016-08-18 MED ORDER — METOPROLOL TARTRATE 50 MG PO TABS: 50.0000 mg | ORAL_TABLET | Freq: Two times a day (BID) | ORAL | 3 refills | Status: DC

## 2016-08-18 NOTE — Telephone Encounter (Signed)
Medication was sent to [facility] during OV

## 2016-08-18 NOTE — Patient Instructions (Signed)
Your physician has recommended you make the following change in your medication:  1.) decrease metoprolol to 50 mg twice a day  Your physician recommends that you return for lab work today (BMET, CBC)  Your physician wants you to follow-up in: [place].  You will receive a reminder letter in the mail two months in advance. If you don't receive a letter, please call our office to schedule the follow-up appointment.

## 2016-08-18 NOTE — Telephone Encounter (Signed)
New message      Pt was just seen by Dr Harrington Challenger.  She forgot to ask nurse to call in her (new dosage) metoprolol to [facility]

## 2016-08-19 LAB — CBC
HEMATOCRIT: 40.2 % (ref 34.0–46.6)
Hemoglobin: 13.8 g/dL (ref 11.1–15.9)
MCH: 30.9 pg (ref 26.6–33.0)
MCHC: 34.3 g/dL (ref 31.5–35.7)
MCV: 90 fL (ref 79–97)
PLATELETS: 159 10*3/uL (ref 150–379)
RBC: 4.47 x10E6/uL (ref 3.77–5.28)
RDW: 15 % (ref 12.3–15.4)
WBC: 5.7 10*3/uL (ref 3.4–10.8)

## 2016-08-19 LAB — BASIC METABOLIC PANEL
BUN/Creatinine Ratio: 30 — ABNORMAL HIGH (ref 12–28)
BUN: 35 mg/dL — AB (ref 8–27)
CALCIUM: 9.8 mg/dL (ref 8.7–10.3)
CO2: 27 mmol/L (ref 20–29)
CREATININE: 1.17 mg/dL — AB (ref 0.57–1.00)
Chloride: 96 mmol/L (ref 96–106)
GFR calc Af Amer: 52 mL/min/{1.73_m2} — ABNORMAL LOW (ref >59)
GFR calc non Af Amer: 45 mL/min/{1.73_m2} — ABNORMAL LOW (ref >59)
GLUCOSE: 89 mg/dL (ref 65–99)
Potassium: 4.2 mmol/L (ref 3.5–5.2)
SODIUM: 139 mmol/L (ref 134–144)

## 2016-08-24 ENCOUNTER — Encounter: Payer: Self-pay | Admitting: Nurse Practitioner

## 2016-08-24 ENCOUNTER — Ambulatory Visit: Payer: Medicare Other | Admitting: Pain Medicine

## 2016-08-24 ENCOUNTER — Ambulatory Visit: Payer: Medicare Other | Attending: Pain Medicine | Admitting: Nurse Practitioner

## 2016-08-24 DIAGNOSIS — I4891 Unspecified atrial fibrillation: Secondary | ICD-10-CM | POA: Insufficient documentation

## 2016-08-24 DIAGNOSIS — M533 Sacrococcygeal disorders, not elsewhere classified: Secondary | ICD-10-CM | POA: Insufficient documentation

## 2016-08-24 DIAGNOSIS — Z7901 Long term (current) use of anticoagulants: Secondary | ICD-10-CM | POA: Insufficient documentation

## 2016-08-24 DIAGNOSIS — N183 Chronic kidney disease, stage 3 (moderate): Secondary | ICD-10-CM | POA: Insufficient documentation

## 2016-08-24 DIAGNOSIS — M48061 Spinal stenosis, lumbar region without neurogenic claudication: Secondary | ICD-10-CM | POA: Insufficient documentation

## 2016-08-24 DIAGNOSIS — I129 Hypertensive chronic kidney disease with stage 1 through stage 4 chronic kidney disease, or unspecified chronic kidney disease: Secondary | ICD-10-CM | POA: Insufficient documentation

## 2016-08-24 DIAGNOSIS — E669 Obesity, unspecified: Secondary | ICD-10-CM | POA: Insufficient documentation

## 2016-08-24 DIAGNOSIS — Z950 Presence of cardiac pacemaker: Secondary | ICD-10-CM | POA: Diagnosis not present

## 2016-08-24 DIAGNOSIS — Z79891 Long term (current) use of opiate analgesic: Secondary | ICD-10-CM | POA: Diagnosis not present

## 2016-08-24 DIAGNOSIS — K449 Diaphragmatic hernia without obstruction or gangrene: Secondary | ICD-10-CM | POA: Diagnosis not present

## 2016-08-24 DIAGNOSIS — M1612 Unilateral primary osteoarthritis, left hip: Secondary | ICD-10-CM | POA: Insufficient documentation

## 2016-08-24 DIAGNOSIS — M25552 Pain in left hip: Secondary | ICD-10-CM | POA: Diagnosis not present

## 2016-08-24 DIAGNOSIS — M109 Gout, unspecified: Secondary | ICD-10-CM | POA: Diagnosis not present

## 2016-08-24 DIAGNOSIS — Z6841 Body Mass Index (BMI) 40.0 and over, adult: Secondary | ICD-10-CM | POA: Diagnosis not present

## 2016-08-24 DIAGNOSIS — K5903 Drug induced constipation: Secondary | ICD-10-CM | POA: Diagnosis not present

## 2016-08-24 DIAGNOSIS — E785 Hyperlipidemia, unspecified: Secondary | ICD-10-CM | POA: Diagnosis not present

## 2016-08-24 DIAGNOSIS — T402X5A Adverse effect of other opioids, initial encounter: Secondary | ICD-10-CM | POA: Insufficient documentation

## 2016-08-24 DIAGNOSIS — Z96642 Presence of left artificial hip joint: Secondary | ICD-10-CM | POA: Insufficient documentation

## 2016-08-24 DIAGNOSIS — M79605 Pain in left leg: Secondary | ICD-10-CM | POA: Diagnosis present

## 2016-08-24 DIAGNOSIS — M79604 Pain in right leg: Secondary | ICD-10-CM | POA: Diagnosis present

## 2016-08-24 DIAGNOSIS — D638 Anemia in other chronic diseases classified elsewhere: Secondary | ICD-10-CM | POA: Insufficient documentation

## 2016-08-24 DIAGNOSIS — E039 Hypothyroidism, unspecified: Secondary | ICD-10-CM | POA: Diagnosis not present

## 2016-08-24 DIAGNOSIS — G894 Chronic pain syndrome: Secondary | ICD-10-CM | POA: Diagnosis not present

## 2016-08-24 DIAGNOSIS — M7072 Other bursitis of hip, left hip: Secondary | ICD-10-CM | POA: Insufficient documentation

## 2016-08-24 DIAGNOSIS — I495 Sick sinus syndrome: Secondary | ICD-10-CM | POA: Diagnosis not present

## 2016-08-24 DIAGNOSIS — F419 Anxiety disorder, unspecified: Secondary | ICD-10-CM | POA: Insufficient documentation

## 2016-08-24 DIAGNOSIS — K219 Gastro-esophageal reflux disease without esophagitis: Secondary | ICD-10-CM | POA: Diagnosis not present

## 2016-08-24 MED ORDER — OXYCODONE HCL 5 MG PO TABS: 5.0000 mg | ORAL_TABLET | Freq: Every day | ORAL | 0 refills | Status: DC | PRN

## 2016-08-24 NOTE — Progress Notes (Signed)
Patient's Name: [name]  MRN: [id number]  Referring Provider: No ref. provider found  DOB: 1941/02/24  PCP: No primary care provider on file.  DOS: 08/24/2016  Note by: Vevelyn Francois NP  Service setting: Ambulatory outpatient  Specialty: Interventional Pain Management  Location: [facility] (AMB) Pain Management Facility    Patient type: Established    Primary Reason(s) for Visit: Encounter for prescription drug management (Level of risk: moderate) CC: Leg Pain (bilateral)  HPI  [name] is a 76 y.o. year old, female patient, who comes today for a medication management evaluation. She has HYPOTHYROIDISM; HYPERLIPIDEMIA; OBESITY; Essential hypertension; SICK SINUS SYNDROME; Cardiac pacemaker in situ; Cough; GERD (gastroesophageal reflux disease); Atrial fibrillation ([place]); Chronic hip pain (Location of Primary Source of Pain) (Left); Long term current use of opiate analgesic; Long term prescription opiate use; Opiate use (54 MME/Day); Encounter for therapeutic drug level monitoring; Disturbance of skin sensation; Osteoarthritis of hip (Left); Trochanteric bursitis of hip (Left); Lumbar central spinal stenosis (L4-5); Lumbar facet arthropathy ([place]); Osteoarthritis of sacroiliac joint (with vacuum phenomena in) (Bilateral); Avascular necrosis of left femoral head ([place]); Opioid-induced constipation (OIC); Hypothyroidism (acquired); Long term (current) use of opiate analgesic; Morbid obesity with BMI of 40.0-44.9, adult ([place]); PTSD (post-traumatic stress disorder); Spinal stenosis of lumbar region; S/P total hip arthroplasty; Chronic pain syndrome; Status post total replacement of left hip; CKD (chronic kidney disease) stage 3, GFR 30-59 ml/min; Anemia of chronic disease; and Hypocalcemia on her problem list. Her primarily concern today is the Leg Pain (bilateral)  Pain Assessment: Location: Right, Left Leg Radiating: n/a Onset: More than a month ago Duration: Chronic pain Quality: Aching Severity:  0-No pain/10 (self-reported pain score)  Note: Reported level is compatible with observation.                   Effect on ADL:   Timing: Intermittent Modifying factors: medications  [name] was last scheduled for an appointment on 05/11/16 for medication management. During today's appointment we reviewed [name]'s chronic pain status, as well as her outpatient medication regimen. She is having to increased pain at night. She admits that the pain is in her left hip and behind her knees. She cries a night with the pain. She admits that it takes about 2 hours for the Oxycodone 10m to work. She is not able to take the APAP and [employer] secondary to her kidney and liver problems.   The patient  reports that she does not use drugs. Her body mass index is 38.73 kg/m.  Further details on both, my assessment(s), as well as the proposed treatment plan, please see below.  Controlled Substance Pharmacotherapy Assessment REMS (Risk Evaluation and Mitigation Strategy)  Analgesic:Oxycodone IR 5 mg 1 tablet by mouth 5 times a day (25 mg/day) MME/day:37.590mday WhLandis MartinsRN  08/24/2016 10:31 AM  Sign at close encounter Nursing Pain Medication Assessment:  Safety precautions to be maintained throughout the outpatient stay will include: orient to surroundings, keep bed in low position, maintain call bell within reach at all times, provide assistance with transfer out of bed and ambulation.  Medication Inspection Compliance: Pill count conducted under aseptic conditions, in front of the patient. Neither the pills nor the bottle was removed from the patient's sight at any time. Once count was completed pills were immediately returned to the patient in their original bottle.  Medication: Oxycodone IR Pill/Patch Count: 70 of 150 pills remain Pill/Patch Appearance: Markings consistent with prescribed medication Bottle Appearance: Standard pharmacy  container. Clearly labeled. Filled Date: 06/09 /  2018 Last Medication intake:  Yesterday   Pharmacokinetics: Liberation and absorption (onset of action): WNL Distribution (time to peak effect): WNL Metabolism and excretion (duration of action): WNL         Pharmacodynamics: Desired effects: Analgesia: [name] reports >50% benefit. Functional ability: Patient reports that medication allows her to accomplish basic ADLs Clinically meaningful improvement in function (CMIF): Sustained CMIF goals met Perceived effectiveness: Described as relatively effective, allowing for increase in activities of daily living (ADL) Undesirable effects: Side-effects or Adverse reactions: None reported Monitoring: [place] PMP: Online review of the past 72-monthperiod conducted. Compliant with practice rules and regulations List of all UDS test(s) done:  Lab Results  Component Value Date   TOXASSSELUR FINAL 09/30/2015   SUMMARY FINAL 08/11/2015   Last UDS on record: ToxAssure Select 13  Date Value Ref Range Status  09/30/2015 FINAL  Final    Comment:    ==================================================================== TOXASSURE SELECT 13 (MW) ==================================================================== Test                             Result       Flag       Units Drug Present and Declared for Prescription Verification   Oxycodone                      405          EXPECTED   ng/mg creat   Oxymorphone                    1262         EXPECTED   ng/mg creat   Noroxycodone                   1066         EXPECTED   ng/mg creat   Noroxymorphone                 321          EXPECTED   ng/mg creat    Sources of oxycodone are scheduled prescription medications.    Oxymorphone, noroxycodone, and noroxymorphone are expected    metabolites of oxycodone. Oxymorphone is also available as a    scheduled prescription medication. Drug Present not Declared for Prescription Verification   Oxazepam                       76           UNEXPECTED ng/mg creat     Oxazepam may be administered as a scheduled prescription    medication; it is also an expected metabolite of other    benzodiazepine drugs, including diazepam, chlordiazepoxide,    prazepam, clorazepate, halazepam, and temazepam. ==================================================================== Test                      Result    Flag   Units      Ref Range   Creatinine              151              mg/dL      >=20 ==================================================================== Declared Medications:  The flagging and interpretation on this report are based on the  following declared medications.  Unexpected results may arise from  inaccuracies in the declared medications.  **Note: The testing  scope of this panel includes these medications:  Oxycodone (Roxicodone)  **Note: The testing scope of this panel does not include following  reported medications:  Colchicine  Levothyroxine (Synthroid)  LIOTHYRONINE (Cytomel)  Magnesium (Mag)  Metoprolol (Lopressor)  Multivitamin (MVI)  Potassium (Micro-K)  Supplement  Warfarin (Coumadin) ==================================================================== For clinical consultation, please call [phone]. ====================================================================    Summary  Date Value Ref Range Status  08/11/2015 FINAL  Final    Comment:    ==================================================================== TOXASSURE COMP DRUG ANALYSIS,UR ==================================================================== Test                             Result       Flag       Units Drug Present and Declared for Prescription Verification   Codeine                        1060         EXPECTED   ng/mg creat   Morphine                       464          EXPECTED   ng/mg creat   Norcodeine                     63           EXPECTED   ng/mg creat    Sources of codeine include scheduled prescription medications;    morphine is an  expected metabolite of codeine. Other sources of    morphine include scheduled prescription medications or as a    metabolite of heroin.    Norcodeine is an expected metabolite of codeine.   Cyclobenzaprine                PRESENT      EXPECTED   Acetaminophen                  PRESENT      EXPECTED   Metoprolol                     PRESENT      EXPECTED Drug Present not Declared for Prescription Verification   Desmethyldiazepam              57           UNEXPECTED ng/mg creat   Oxazepam                       174          UNEXPECTED ng/mg creat   Temazepam                      104          UNEXPECTED ng/mg creat    Desmethyldiazepam, oxazepam, and temazepam are benzodiazepine    drugs, but may also be present as common metabolites of other    benzodiazepine drugs, including diazepam. ==================================================================== Test                      Result    Flag   Units      Ref Range   Creatinine              109  mg/dL      >=20 ==================================================================== Declared Medications:  The flagging and interpretation on this report are based on the  following declared medications.  Unexpected results may arise from  inaccuracies in the declared medications.  **Note: The testing scope of this panel includes these medications:  Codeine (Acetaminophen-Codeine)  Cyclobenzaprine  Metoprolol  **Note: The testing scope of this panel does not include small to  moderate amounts of these reported medications:  Acetaminophen (Acetaminophen-Codeine)  **Note: The testing scope of this panel does not include following  reported medications:  Colchicine  Hydrochlorothiazide (Triamterine-Hydrochlorthzide)  Levothyroxine  Liothyronine  Multivitamin  Potassium  Triamterene (Triamterine-Hydrochlorthzide)  Warfarin ==================================================================== For clinical consultation, please call  [phone]. ====================================================================    UDS interpretation: Compliant          Medication Assessment Form: Reviewed. Patient indicates being compliant with therapy Treatment compliance: Compliant Risk Assessment Profile: Aberrant behavior: See prior evaluations. None observed or detected today Comorbid factors increasing risk of overdose: See prior notes. No additional risks detected today Risk of substance use disorder (SUD): Low Opioid Risk Tool (ORT) Total Score: 0  Interpretation Table:  Score <3 = Low Risk for SUD  Score between 4-7 = Moderate Risk for SUD  Score >8 = High Risk for Opioid Abuse   Risk Mitigation Strategies:  Patient Counseling: Covered Patient-Prescriber Agreement (PPA): Present and active  Notification to other healthcare providers: Done  Pharmacologic Plan: No change in therapy, at this time  Laboratory Chemistry  Inflammation Markers Lab Results  Component Value Date   ESRSEDRATE 28 12/08/2015   (CRP: Acute Phase) (ESR: Chronic Phase) Renal Function Markers Lab Results  Component Value Date   BUN 35 (H) 08/18/2016   CREATININE 1.17 (H) 08/18/2016   GFRAA 52 (L) 08/18/2016   GFRNONAA 45 (L) 08/18/2016   Hepatic Function Markers Lab Results  Component Value Date   AST 32 05/11/2016   ALT 19 05/11/2016   ALBUMIN 4.1 05/11/2016   ALKPHOS 47 05/11/2016   Electrolytes Lab Results  Component Value Date   NA 139 08/18/2016   K 4.2 08/18/2016   CL 96 08/18/2016   CALCIUM 9.8 08/18/2016   Neuropathy Markers Lab Results  Component Value Date   VITAMINB12 >7,500 (H) 09/30/2015   Bone Pathology Markers Lab Results  Component Value Date   ALKPHOS 47 05/11/2016   25OHVITD1 42 09/30/2015   25OHVITD2 7.3 09/30/2015   25OHVITD3 35 09/30/2015   CALCIUM 9.8 08/18/2016   Coagulation Parameters Lab Results  Component Value Date   INR 1.78 12/24/2015   LABPROT 20.9 (H) 12/24/2015   APTT 36  12/08/2015   PLT 159 08/18/2016   Cardiovascular Markers Lab Results  Component Value Date   HGB 13.8 08/18/2016   HCT 40.2 08/18/2016   Note: Lab results reviewed.  Recent Diagnostic Imaging Review  No results found. Note: Imaging results reviewed.          Meds   Current Outpatient Prescriptions (Endocrine & Metabolic):  Marland Kitchen  Thyroid (LEVOTHYROXINE-LIOTHYRONINE) 30 MG TABS, Take 1 tablet by mouth daily.   Current Outpatient Prescriptions (Cardiovascular):  .  metoprolol tartrate (LOPRESSOR) 50 MG tablet, Take 1 tablet (50 mg total) by mouth 2 (two) times daily. Marland Kitchen  triamterene-hydrochlorothiazide (MAXZIDE) 75-50 MG tablet, Take 1 tablet by mouth daily.   Current Outpatient Prescriptions (Analgesics):  .  colchicine 0.6 MG tablet, Take 0.6 mg by mouth 3 (three) times a week.  .  OxyCODONE HCl, Abuse Deter, (OXAYDO) 5 MG TABA, Take  5 mg by mouth 5 (five) times daily as needed. Derrill Memo ON 09/04/2016] oxyCODONE (OXY IR/ROXICODONE) 5 MG immediate release tablet, Take 1 tablet (5 mg total) by mouth 5 (five) times daily as needed for severe pain. Derrill Memo ON 10/04/2016] oxyCODONE (OXY IR/ROXICODONE) 5 MG immediate release tablet, Take 1 tablet (5 mg total) by mouth 5 (five) times daily as needed for severe pain. Derrill Memo ON 11/03/2016] oxyCODONE (OXY IR/ROXICODONE) 5 MG immediate release tablet, Take 1 tablet (5 mg total) by mouth 5 (five) times daily as needed for severe pain.  Current Outpatient Prescriptions (Hematological):  .  warfarin (COUMADIN) 5 MG tablet, Take 5 mg by mouth daily. I tab 1 day a week All other days 1/2 tab  Current Outpatient Prescriptions (Other):  .  diazepam (VALIUM) 10 MG tablet, Take 10 mg by mouth 2 (two) times daily as needed for anxiety. .  Wheat Dextrin (BENEFIBER) POWD, Stir 2 tsp. TID into 4-8 oz of any non-carbonated beverage or soft food (hot or cold)  ROS  Constitutional: Denies any fever or chills Gastrointestinal: No reported hemesis,  hematochezia, vomiting, or acute GI distress Musculoskeletal: Denies any acute onset joint swelling, redness, loss of ROM, or weakness Neurological: No reported episodes of acute onset apraxia, aphasia, dysarthria, agnosia, amnesia, paralysis, loss of coordination, or loss of consciousness  Allergies  [name] is allergic to contrast media [iodinated diagnostic agents].  PFSH  Drug: [name]  reports that she does not use drugs. Alcohol:  reports that she does not drink alcohol. Tobacco:  reports that she has never smoked. She has never used smokeless tobacco. Medical:  has a past medical history of Anxiety; Aortic insufficiency; Arthritis; Asthma; Bronchitis; Bursitis, hip; Claustrophobia; Claustrophobia; Dysrhythmia; GERD (gastroesophageal reflux disease); Gout; Hyperlipidemia; Hypertension; Hypothyroidism; Mitral regurgitation; Obesity; Panic attacks; Paroxysmal atrial fibrillation (HCC); PTSD (post-traumatic stress disorder); Rhinitis; Sick sinus syndrome ([place]); and Sleep apnea. Surgical: [name]  has a past surgical history that includes Tonsillectomy; Pacemaker insertion; Status post child birth x1; Rotator cuff repair; Dilatation & curettage/hysteroscopy with myosure (N/A, 08/03/2015); Insert / replace / remove pacemaker; and Total hip arthroplasty (Left, 12/22/2015). Family: family history includes Alzheimer's disease in her father; Arthritis in her sister; Cancer in her brother and maternal grandfather; Heart disease in her mother; Hypertension in her sister; Stroke in her mother.  Constitutional Exam  General appearance: Well nourished, well developed, and well hydrated. In no apparent acute distress Vitals:   08/24/16 1018  BP: 106/83  Pulse: 71  Resp: 16  Temp: 98.1 F (36.7 C)  TempSrc: Oral  SpO2: 100%  Weight: 205 lb (93 kg)  Height: _0  (1.549 m)   BMI Assessment: Estimated body mass index is 38.73 kg/m as calculated from the following:   Height as of this  encounter: _1  (1.549 m).   Weight as of this encounter: 205 lb (93 kg).  BMI interpretation table: BMI level Category Range association with higher incidence of chronic pain  <18 kg/m2 Underweight   18.5-24.9 kg/m2 Ideal body weight   25-29.9 kg/m2 Overweight Increased incidence by 20%  30-34.9 kg/m2 Obese (Class I) Increased incidence by 68%  35-39.9 kg/m2 Severe obesity (Class II) Increased incidence by 136%  >40 kg/m2 Extreme obesity (Class III) Increased incidence by 254%   BMI Readings from Last 4 Encounters:  08/24/16 38.73 kg/m  08/18/16 40.25 kg/m  05/11/16 39.30 kg/m  02/24/16 42.54 kg/m   Wt Readings from Last 4 Encounters:  08/24/16  205 lb (93 kg)  08/18/16 213 lb (96.6 kg)  05/11/16 208 lb (94.3 kg)  02/24/16 217 lb 12.8 oz (98.8 kg)  Psych/Mental status: Alert, oriented x 3 (person, place, & time)       Eyes: PERLA Respiratory: No evidence of acute respiratory distress  Cervical Spine Exam  Inspection: No masses, redness, or swelling Alignment: Symmetrical Functional ROM: Unrestricted ROM      Stability: No instability detected Muscle strength & Tone: Functionally intact Sensory: Unimpaired Palpation: No palpable anomalies              Upper Extremity (UE) Exam    Side: Right upper extremity  Side: Left upper extremity  Inspection: No masses, redness, swelling, or asymmetry. No contractures  Inspection: No masses, redness, swelling, or asymmetry. No contractures  Functional ROM: Unrestricted ROM          Functional ROM: Unrestricted ROM          Muscle strength & Tone: Functionally intact  Muscle strength & Tone: Functionally intact  Sensory: Unimpaired  Sensory: Unimpaired  Palpation: No palpable anomalies              Palpation: No palpable anomalies              Specialized Test(s): Deferred         Specialized Test(s): Deferred          Thoracic Spine Exam  Inspection: No masses, redness, or swelling Alignment: Symmetrical Functional ROM:  Unrestricted ROM Stability: No instability detected Sensory: Unimpaired Muscle strength & Tone: No palpable anomalies  Lumbar Spine Exam  Inspection: No masses, redness, or swelling Alignment: Symmetrical Functional ROM: Unrestricted ROM      Stability: No instability detected Muscle strength & Tone: Functionally intact Sensory: Unimpaired Palpation: No palpable anomalies       Provocative Tests: Lumbar Hyperextension and rotation test: evaluation deferred today       Patrick's Maneuver: evaluation deferred today                    Gait & Posture Assessment  Ambulation: Unassisted Gait: Relatively normal for age and body habitus Posture: WNL   Lower Extremity Exam    Side: Right lower extremity  Side: Left lower extremity  Inspection: No masses, redness, swelling, or asymmetry. No contractures  Inspection: No masses, redness, swelling, or asymmetry. No contractures  Functional ROM: Unrestricted ROM          Functional ROM: Unrestricted ROM          Muscle strength & Tone: Functionally intact  Muscle strength & Tone: Functionally intact  Sensory: Unimpaired  Sensory: Unimpaired  Palpation: No palpable anomalies  Palpation: No palpable anomalies   Assessment  Primary Diagnosis & Pertinent Problem List: The encounter diagnosis was Chronic pain syndrome.  Status Diagnosis  Controlled Controlled Controlled 1. Chronic pain syndrome     Problems updated and reviewed during this visit: Problem  Chronic Pain Syndrome  Status Post Total Replacement of Left Hip  Spinal Stenosis of Lumbar Region   Overview:  L4-5   Opioid-induced constipation (OIC)  Avascular Necrosis of Left Femoral Head (Hcc)  Chronic hip pain (Location of Primary Source of Pain) (Left)  Osteoarthritis of hip (Left)  Trochanteric bursitis of hip (Left)  Lumbar central spinal stenosis (L4-5)  Lumbar facet arthropathy (HCC)  Osteoarthritis of sacroiliac joint (with vacuum phenomena in) (Bilateral)  Cardiac  Pacemaker in Situ   Qualifier: Diagnosis of  By: Stanford Breed, MD, [facility],  Pauline Good   Overview:  Overview:  Qualifier: Diagnosis of  By: Stanford Breed, MD, Kandyce Rud  Last Assessment & Plan:  Her St. Jude dual chamber PM is working normally. She remains programmed VVI.    Long Term (Current) Use of Opiate Analgesic   Overview:  opiate use (54 MME/Day)   Long Term Current Use of Opiate Analgesic  Long Term Prescription Opiate Use  Opiate use (54 MME/Day)   Analgesic: Codeine/APAP (Tylenol No. 4) 60/300 tablet every 4 hours (360 mg/day of codeine) (1800 mg/day of Tylenol)   Encounter for Therapeutic Drug Level Monitoring  Ckd (Chronic Kidney Disease) Stage 3, Gfr 30-59 Ml/Min  Anemia of Chronic Disease  Hypocalcemia  S/P Total Hip Arthroplasty  Hypothyroidism (Acquired)  Morbid Obesity With Bmi of 40.0-44.9, Adult (Hcc)  Ptsd (Post-Traumatic Stress Disorder)  Disturbance of Skin Sensation  Atrial Fibrillation (Hcc)   Overview:  Last Assessment & Plan:  Her ventricular rate is well controlled. No change in meds.   Overview:  Last Assessment & Plan:  Her ventricular rate is well controlled. No change in meds.   Overview:  Last Assessment & Plan:  Her ventricular rate is well controlled. No change in meds.    Gerd (Gastroesophageal Reflux Disease)   Hiatal hernia with high level reflux and microaspiration With associated cyclical cough No primary lung disease 8/12 spiro normal Obesity playing a role  Overview:  Overview:  Hiatal hernia with high level reflux and microaspiration With associated cyclical cough No primary lung disease 8/12 spiro normal Obesity playing a role  Last Assessment & Plan:  Hiatal hernia with high level reflux and microaspiration With associated cyclical cough No primary lung disease 8/12 spiro normal Obesity playing a role  Plan Cyclic cough protocol with tussicaps/tessalon perles Weight loss Omeprazole daily Reflux diet     Cough   See GERD assessment   HYPOTHYROIDISM   Qualifier: Diagnosis of  By: Burnett Kanaris     HYPERLIPIDEMIA   Qualifier: Diagnosis of  By: Burnett Kanaris     OBESITY   Qualifier: Diagnosis of  By: Burnett Kanaris     Essential Hypertension   Qualifier: Diagnosis of  By: Burnett Kanaris     SICK SINUS SYNDROME   Qualifier: Diagnosis of  By: Burnett Kanaris      Plan of Care  Pharmacotherapy (Medications Ordered): Meds ordered this encounter  Medications  . oxyCODONE (OXY IR/ROXICODONE) 5 MG immediate release tablet    Sig: Take 1 tablet (5 mg total) by mouth 5 (five) times daily as needed for severe pain.    Dispense:  150 tablet    Refill:  0    Patient may have prescription filled one day early if pharmacy is closed on scheduled refill date. Do not fill until: 09/04/16 To last until: 10/04/16    Order Specific Question:   Supervising Provider    Answer:   Milinda Pointer [phone]  . oxyCODONE (OXY IR/ROXICODONE) 5 MG immediate release tablet    Sig: Take 1 tablet (5 mg total) by mouth 5 (five) times daily as needed for severe pain.    Dispense:  150 tablet    Refill:  0    Patient may have prescription filled one day early if pharmacy is closed on scheduled refill date. Do not fill until: 10/04/16 To last until: 11/03/16    Order Specific Question:   Supervising Provider    Answer:   Milinda Pointer [phone]  . oxyCODONE (OXY IR/ROXICODONE) 5 MG immediate  release tablet    Sig: Take 1 tablet (5 mg total) by mouth 5 (five) times daily as needed for severe pain.    Dispense:  150 tablet    Refill:  0    Patient may have prescription filled one day early if pharmacy is closed on scheduled refill date. Do not fill until: 11/03/16 To last until: 12/03/16    Order Specific Question:   Supervising Provider    Answer:   Milinda Pointer [phone]   New Prescriptions   No medications on file   Medications administered today: [name]  had no medications administered during this visit. Lab-work, procedure(s), and/or referral(s): No orders of the defined types were placed in this encounter.  Imaging and/or referral(s): None  Interventional therapies: Interventional therapies: Planned, scheduled, and/or pending:   Not at this time.   Considering:   None at this time.    Palliative PRN treatment(s):   None at this time.     Provider-requested follow-up: No Follow-up on file.  No future appointments. Primary Care Physician: No primary care provider on file. Location: [facility] Note by: Vevelyn Francois NP Date: 08/24/2016; Time: 10:50 AM  Pain Score Disclaimer: We use the NRS-11 scale. This is a self-reported, subjective measurement of pain severity with only modest accuracy. It is used primarily to identify changes within a particular patient. It must be understood that outpatient pain scales are significantly less accurate that those used for research, where they can be applied under ideal controlled circumstances with minimal exposure to variables. In reality, the score is likely to be a combination of pain intensity and pain affect, where pain affect describes the degree of emotional arousal or changes in action readiness caused by the sensory experience of pain. Factors such as social and work situation, setting, emotional state, anxiety levels, expectation, and prior pain experience may influence pain perception and show large inter-individual differences that may also be affected by time variables.  Patient instructions provided during this appointment: There are no Patient Instructions on file for this visit.

## 2016-08-24 NOTE — Patient Instructions (Signed)

## 2016-08-24 NOTE — Progress Notes (Signed)
Nursing Pain Medication Assessment:  Safety precautions to be maintained throughout the outpatient stay will include: orient to surroundings, keep bed in low position, maintain call bell within reach at all times, provide assistance with transfer out of bed and ambulation.  Medication Inspection Compliance: Pill count conducted under aseptic conditions, in front of the patient. Neither the pills nor the bottle was removed from the patient's sight at any time. Once count was completed pills were immediately returned to the patient in their original bottle.  Medication: Oxycodone IR Pill/Patch Count: 70 of 150 pills remain Pill/Patch Appearance: Markings consistent with prescribed medication Bottle Appearance: Standard pharmacy container. Clearly labeled. Filled Date: 06/09 / 2018 Last Medication intake:  Yesterday

## 2016-08-29 DIAGNOSIS — I482 Chronic atrial fibrillation: Secondary | ICD-10-CM | POA: Diagnosis not present

## 2016-08-29 DIAGNOSIS — Z7901 Long term (current) use of anticoagulants: Secondary | ICD-10-CM | POA: Diagnosis not present

## 2016-09-22 DIAGNOSIS — E782 Mixed hyperlipidemia: Secondary | ICD-10-CM | POA: Diagnosis not present

## 2016-09-22 DIAGNOSIS — R5383 Other fatigue: Secondary | ICD-10-CM | POA: Diagnosis not present

## 2016-09-22 DIAGNOSIS — I7091 Generalized atherosclerosis: Secondary | ICD-10-CM | POA: Diagnosis not present

## 2016-09-22 DIAGNOSIS — R7989 Other specified abnormal findings of blood chemistry: Secondary | ICD-10-CM | POA: Diagnosis not present

## 2016-09-22 DIAGNOSIS — E559 Vitamin D deficiency, unspecified: Secondary | ICD-10-CM | POA: Diagnosis not present

## 2016-09-26 DIAGNOSIS — I482 Chronic atrial fibrillation: Secondary | ICD-10-CM | POA: Diagnosis not present

## 2016-09-26 DIAGNOSIS — Z7901 Long term (current) use of anticoagulants: Secondary | ICD-10-CM | POA: Diagnosis not present

## 2016-09-26 DIAGNOSIS — N951 Menopausal and female climacteric states: Secondary | ICD-10-CM | POA: Diagnosis not present

## 2016-09-28 ENCOUNTER — Telehealth: Payer: Self-pay | Admitting: Internal Medicine

## 2016-09-28 NOTE — Telephone Encounter (Signed)
Walk In pt Form-Medication question. Placed in Dr. Harrington Challenger doc box.

## 2016-09-29 ENCOUNTER — Telehealth: Payer: Self-pay | Admitting: Internal Medicine

## 2016-09-29 NOTE — Telephone Encounter (Signed)
New message      Pt c/o medication issue:  1. Name of Medication:  metoprolol  2. How are you currently taking this medication (dosage and times per day)?  50mg  bid 3. Are you having a reaction (difficulty breathing--STAT)?  no 4. What is your medication issue?  Since starting 50mg  bid, bp has continued to rise.  Last night it was 144/133 and pt states that she "felt funny".  Pt is calling to see if Dr Harrington Challenger would call in [facility] pres[facility]--metoprolol 50mg  1.5 tab in am and 1.5 tab in pm.  Please let her know what Dr Harrington Challenger says

## 2016-09-29 NOTE — Telephone Encounter (Signed)
Patient reports at last OV with Dr. Harrington Challenger her metoprolol from 75 mg BID to 50 mg BID due to BPs "running on low side". Yesterday, she felt "like I was on fire, dizzy and needed to lie down". BP at that time, according to her wrist cuff was 144/133.   Recheck 90 min later was 118/78.  She had taken metoprolol as ordered an no other meds during this time.  We discussed potential inaccuracy of her BP wrist cuff.  She is requesting that Dr. Harrington Challenger increase the metoprolol back to 75 mg BID.  I advised to either purchase an arm cuff, or come to the [facility] clinic with her cuff and list of readings and allow the PharmD to review/make any recommendations.   Pt is in agreement with this plan.  Appointment has been made for 10/03/16.

## 2016-10-03 ENCOUNTER — Ambulatory Visit: Payer: Medicare Other

## 2016-10-24 DIAGNOSIS — I482 Chronic atrial fibrillation: Secondary | ICD-10-CM | POA: Diagnosis not present

## 2016-10-24 DIAGNOSIS — Z7901 Long term (current) use of anticoagulants: Secondary | ICD-10-CM | POA: Diagnosis not present

## 2016-10-31 ENCOUNTER — Encounter: Payer: Self-pay | Admitting: Internal Medicine

## 2016-10-31 DIAGNOSIS — R001 Bradycardia, unspecified: Secondary | ICD-10-CM | POA: Diagnosis not present

## 2016-10-31 DIAGNOSIS — I495 Sick sinus syndrome: Secondary | ICD-10-CM | POA: Diagnosis not present

## 2016-11-21 DIAGNOSIS — I482 Chronic atrial fibrillation: Secondary | ICD-10-CM | POA: Diagnosis not present

## 2016-11-21 DIAGNOSIS — Z7901 Long term (current) use of anticoagulants: Secondary | ICD-10-CM | POA: Diagnosis not present

## 2016-12-05 ENCOUNTER — Telehealth: Payer: Self-pay | Admitting: Internal Medicine

## 2016-12-05 ENCOUNTER — Ambulatory Visit: Payer: Medicare Other | Attending: Nurse Practitioner | Admitting: Nurse Practitioner

## 2016-12-05 ENCOUNTER — Encounter: Payer: Self-pay | Admitting: Nurse Practitioner

## 2016-12-05 VITALS — BP 142/59 | HR 61 | Temp 97.9°F | Resp 18 | Ht 61.0 in | Wt 209.0 lb

## 2016-12-05 DIAGNOSIS — M47816 Spondylosis without myelopathy or radiculopathy, lumbar region: Secondary | ICD-10-CM

## 2016-12-05 DIAGNOSIS — Z79899 Other long term (current) drug therapy: Secondary | ICD-10-CM | POA: Insufficient documentation

## 2016-12-05 DIAGNOSIS — Z5181 Encounter for therapeutic drug level monitoring: Secondary | ICD-10-CM | POA: Diagnosis not present

## 2016-12-05 DIAGNOSIS — E039 Hypothyroidism, unspecified: Secondary | ICD-10-CM | POA: Diagnosis not present

## 2016-12-05 DIAGNOSIS — I495 Sick sinus syndrome: Secondary | ICD-10-CM | POA: Insufficient documentation

## 2016-12-05 DIAGNOSIS — E785 Hyperlipidemia, unspecified: Secondary | ICD-10-CM | POA: Diagnosis not present

## 2016-12-05 DIAGNOSIS — M48062 Spinal stenosis, lumbar region with neurogenic claudication: Secondary | ICD-10-CM | POA: Diagnosis not present

## 2016-12-05 DIAGNOSIS — Z6839 Body mass index (BMI) 39.0-39.9, adult: Secondary | ICD-10-CM | POA: Insufficient documentation

## 2016-12-05 DIAGNOSIS — Z79891 Long term (current) use of opiate analgesic: Secondary | ICD-10-CM | POA: Insufficient documentation

## 2016-12-05 DIAGNOSIS — M1610 Unilateral primary osteoarthritis, unspecified hip: Secondary | ICD-10-CM | POA: Insufficient documentation

## 2016-12-05 DIAGNOSIS — I4891 Unspecified atrial fibrillation: Secondary | ICD-10-CM | POA: Diagnosis not present

## 2016-12-05 DIAGNOSIS — G4730 Sleep apnea, unspecified: Secondary | ICD-10-CM | POA: Diagnosis not present

## 2016-12-05 DIAGNOSIS — G894 Chronic pain syndrome: Secondary | ICD-10-CM

## 2016-12-05 DIAGNOSIS — N183 Chronic kidney disease, stage 3 (moderate): Secondary | ICD-10-CM | POA: Diagnosis not present

## 2016-12-05 DIAGNOSIS — D638 Anemia in other chronic diseases classified elsewhere: Secondary | ICD-10-CM | POA: Diagnosis not present

## 2016-12-05 DIAGNOSIS — I129 Hypertensive chronic kidney disease with stage 1 through stage 4 chronic kidney disease, or unspecified chronic kidney disease: Secondary | ICD-10-CM | POA: Insufficient documentation

## 2016-12-05 DIAGNOSIS — G8929 Other chronic pain: Secondary | ICD-10-CM | POA: Diagnosis not present

## 2016-12-05 DIAGNOSIS — M25552 Pain in left hip: Secondary | ICD-10-CM

## 2016-12-05 DIAGNOSIS — K219 Gastro-esophageal reflux disease without esophagitis: Secondary | ICD-10-CM | POA: Insufficient documentation

## 2016-12-05 DIAGNOSIS — Z950 Presence of cardiac pacemaker: Secondary | ICD-10-CM | POA: Diagnosis not present

## 2016-12-05 DIAGNOSIS — J45909 Unspecified asthma, uncomplicated: Secondary | ICD-10-CM | POA: Insufficient documentation

## 2016-12-05 DIAGNOSIS — Z7901 Long term (current) use of anticoagulants: Secondary | ICD-10-CM | POA: Diagnosis not present

## 2016-12-05 MED ORDER — OXYCODONE HCL 5 MG PO TABS: 5.0000 mg | ORAL_TABLET | Freq: Every day | ORAL | 0 refills | Status: DC | PRN

## 2016-12-05 NOTE — Telephone Encounter (Signed)
New message  Pt verbalized that she want rn to call her she want a December appt with Dr.Klein   only and I made one fro January 10th. She want rn to call her and tell her if that date is okay

## 2016-12-05 NOTE — Progress Notes (Signed)
Nursing Pain Medication Assessment:  Safety precautions to be maintained throughout the outpatient stay will include: orient to surroundings, keep bed in low position, maintain call bell within reach at all times, provide assistance with transfer out of bed and ambulation.  Medication Inspection Compliance: Pill count conducted under aseptic conditions, in front of the patient. Neither the pills nor the bottle was removed from the patient's sight at any time. Once count was completed pills were immediately returned to the patient in their original bottle.  Medication: Oxycodone IR Pill/Patch Count: 12 of 150 pills remain Pill/Patch Appearance: Markings consistent with prescribed medication Bottle Appearance: Standard pharmacy container. Clearly labeled. Filled Date: 09 / 07 / 2018 Last Medication intake:  Today

## 2016-12-05 NOTE — Progress Notes (Signed)
Patient's Name: [name]  MRN: [id number]  Referring Provider: No ref. provider found  DOB: Feb 16, 1941  PCP: No primary care provider on file.  DOS: 12/05/2016  Note by: Vevelyn Francois NP  Service setting: Ambulatory outpatient  Specialty: Interventional Pain Management  Location: [facility] (AMB) Pain Management Facility    Patient type: Established    Primary Reason(s) for Visit: Encounter for prescription drug management. (Level of risk: moderate)  CC: Hip Pain (left)  HPI  [name] is a 76 y.o. year old, female patient, who comes today for a medication management evaluation. She has HYPOTHYROIDISM; HYPERLIPIDEMIA; OBESITY; Essential hypertension; SICK SINUS SYNDROME; Cardiac pacemaker in situ; Cough; GERD (gastroesophageal reflux disease); Atrial fibrillation ([place]); Chronic hip pain (Location of Primary Source of Pain) (Left); Long term current use of opiate analgesic; Long term prescription opiate use; Opiate use (54 MME/Day); Encounter for therapeutic drug level monitoring; Disturbance of skin sensation; Osteoarthritis of hip (Left); Trochanteric bursitis of hip (Left); Lumbar central spinal stenosis (L4-5); Lumbar facet arthropathy ([place]); Osteoarthritis of sacroiliac joint (with vacuum phenomena in) (Bilateral); Avascular necrosis of left femoral head ([place]); Opioid-induced constipation ([facility]); Hypothyroidism (acquired); Long term (current) use of opiate analgesic; Morbid obesity with BMI of 40.0-44.9, adult ([place]); PTSD (post-traumatic stress disorder); Spinal stenosis of lumbar region; S/P total hip arthroplasty; Chronic pain syndrome; Status post total replacement of left hip; CKD (chronic kidney disease) stage 3, GFR 30-59 ml/min ([place]); Anemia of chronic disease; and Hypocalcemia on her problem list. Her primarily concern today is the Hip Pain (left)  Pain Assessment: Location: Left Hip Radiating: radiates down left leg Onset: More than a month ago Duration: Chronic pain Quality: Aching,  Dull Severity: 3 /10 (self-reported pain score)  Note: Reported level is compatible with observation.                    Effect on ADL:   Timing: Constant Modifying factors:    [name] was last scheduled for an appointment on 08/24/2016 for medication management. During today's appointment we reviewed [name]'s chronic pain status, as well as her outpatient medication regimen. She continues to have aching pain. She deneis any numbness or tingling. She does have some weakness. She feels like her pain runs along the incisional line. She admits that it is worse at night when she is lying down.   The patient  reports that she does not use drugs. Her body mass index is 39.49 kg/m.  Further details on both, my assessment(s), as well as the proposed treatment plan, please see below.  Controlled Substance Pharmacotherapy Assessment REMS (Risk Evaluation and Mitigation Strategy)  Analgesic:Oxycodone IR 5 mg 1 tablet by mouth 5 times a day (25 mg/day) MME/day:37.58m/day TDewayne Shorter RN  12/05/2016  8:43 AM  Signed Nursing Pain Medication Assessment:  Safety precautions to be maintained throughout the outpatient stay will include: orient to surroundings, keep bed in low position, maintain call bell within reach at all times, provide assistance with transfer out of bed and ambulation.  Medication Inspection Compliance: Pill count conducted under aseptic conditions, in front of the patient. Neither the pills nor the bottle was removed from the patient's sight at any time. Once count was completed pills were immediately returned to the patient in their original bottle.  Medication: Oxycodone IR Pill/Patch Count: 12 of 150 pills remain Pill/Patch Appearance: Markings consistent with prescribed medication Bottle Appearance: Standard pharmacy container. Clearly labeled. Filled Date: 09 / 07 / 2018 Last Medication intake:  Today  Pharmacokinetics: Liberation and absorption (onset of action):  WNL Distribution (time to peak effect): WNL Metabolism and excretion (duration of action): WNL         Pharmacodynamics: Desired effects: Analgesia: [name] reports >50% benefit. Functional ability: Patient reports that medication allows her to accomplish basic ADLs Clinically meaningful improvement in function (CMIF): Sustained CMIF goals met Perceived effectiveness: Described as relatively effective, allowing for increase in activities of daily living (ADL) Undesirable effects: Side-effects or Adverse reactions: None reported Monitoring: [place] PMP: Online review of the past 75-monthperiod conducted. Compliant with practice rules and regulations Last UDS on record: Summary  Date Value Ref Range Status  08/11/2015 FINAL  Final    Comment:    ==================================================================== TOXAS[facility]RE COMP DRUG ANALYSIS,UR ==================================================================== Test                             Result       Flag       Units Drug Present and Declared for Prescription Verification   Codeine                        1060         EXPECTED   ng/mg creat   Morphine                       464          EXPECTED   ng/mg creat   Norcodeine                     63           EXPECTED   ng/mg creat    Sources of codeine include scheduled prescription medications;    morphine is an expected metabolite of codeine. Other sources of    morphine include scheduled prescription medications or as a    metabolite of heroin.    Norcodeine is an expected metabolite of codeine.   Cyclobenzaprine                PRESENT      EXPECTED   Acetaminophen                  PRESENT      EXPECTED   Metoprolol                     PRESENT      EXPECTED Drug Present not Declared for Prescription Verification   Desmethyldiazepam              57           UNEXPECTED ng/mg creat   Oxazepam                       174          UNEXPECTED ng/mg creat   Temazepam                       104          UNEXPECTED ng/mg creat    Desmethyldiazepam, oxazepam, and temazepam are benzodiazepine    drugs, but may also be present as common metabolites of other    benzodiazepine drugs, including diazepam. ==================================================================== Test                      Result  Flag   Units      Ref Range   Creatinine              109              mg/dL      >=20 ==================================================================== Declared Medications:  The flagging and interpretation on this report are based on the  following declared medications.  Unexpected results may arise from  inaccuracies in the declared medications.  **Note: The testing scope of this panel includes these medications:  Codeine (Acetaminophen-Codeine)  Cyclobenzaprine  Metoprolol  **Note: The testing scope of this panel does not include small to  moderate amounts of these reported medications:  Acetaminophen (Acetaminophen-Codeine)  **Note: The testing scope of this panel does not include following  reported medications:  Colchicine  Hydrochlorothiazide (Triamterine-Hydrochlorthzide)  Levothyroxine  Liothyronine  Multivitamin  Potassium  Triamterene (Triamterine-Hydrochlorthzide)  Warfarin ==================================================================== For clinical consultation, please call [phone]. ====================================================================    UDS interpretation: Compliant          Medication Assessment Form: Reviewed. Patient indicates being compliant with therapy Treatment compliance: Compliant Risk Assessment Profile: Aberrant behavior: See prior evaluations. None observed or detected today Comorbid factors increasing risk of overdose: See prior notes. No additional risks detected today Risk of substance use disorder (SUD): Low     Opioid Risk Tool - 12/05/16 0841      Family History of Substance Abuse   Alcohol  Negative   Illegal Drugs Negative   Rx Drugs Negative     Personal History of Substance Abuse   Alcohol Negative   Illegal Drugs Negative   Rx Drugs Negative     Age   Age between 71-45 years  No     History of Preadolescent Sexual Abuse   History of Preadolescent Sexual Abuse Negative or Female     Psychological Disease   Psychological Disease Negative   Depression Negative     Total Score   Opioid Risk Tool Scoring 0   Opioid Risk Interpretation Low Risk     ORT Scoring interpretation table:  Score <3 = Low Risk for SUD  Score between 4-7 = Moderate Risk for SUD  Score >8 = High Risk for Opioid Abuse   Risk Mitigation Strategies:  Patient Counseling: Covered Patient-Prescriber Agreement (PPA): Present and active  Notification to other healthcare providers: Done  Pharmacologic Plan: No change in therapy, at this time  Laboratory Chemistry  Inflammation Markers (CRP: Acute Phase) (ESR: Chronic Phase) Lab Results  Component Value Date   ESRSEDRATE 28 12/08/2015                 Renal Function Markers Lab Results  Component Value Date   BUN 35 (H) 08/18/2016   CREATININE 1.17 (H) 08/18/2016   GFRAA 52 (L) 08/18/2016   GFRNONAA 45 (L) 08/18/2016                 Hepatic Function Markers Lab Results  Component Value Date   AST 32 05/11/2016   ALT 19 05/11/2016   ALBUMIN 4.1 05/11/2016   ALKPHOS 47 05/11/2016                 Electrolytes Lab Results  Component Value Date   NA 139 08/18/2016   K 4.2 08/18/2016   CL 96 08/18/2016   CALCIUM 9.8 08/18/2016                 Neuropathy Markers Lab Results  Component Value Date  VITAMINB12 >7,500 (H) 09/30/2015                 Bone Pathology Markers Lab Results  Component Value Date   ALKPHOS 47 05/11/2016   25OHVITD1 42 09/30/2015   25OHVITD2 7.3 09/30/2015   25OHVITD3 35 09/30/2015   CALCIUM 9.8 08/18/2016                 Coagulation Parameters Lab Results  Component Value Date   INR 1.78  12/24/2015   LABPROT 20.9 (H) 12/24/2015   APTT 36 12/08/2015   PLT 159 08/18/2016                 Cardiovascular Markers Lab Results  Component Value Date   HGB 13.8 08/18/2016   HCT 40.2 08/18/2016                 Note: Lab results reviewed.  Recent Diagnostic Imaging Results  Implantable device check Pacemaker check in clinic. Normal device function. Threshold, sensing, impedance consistent with previous measurements. Device programmed to maximize longevity. Permanent AF + Warfarin. Episode triggers are off. Device programmed at appropriate safety  margins. Histogram distribution appropriate for patient activity level. Device programmed to optimize intrinsic conduction. Estimated longevity 3.25-4 years. Patient education completed. Patient will follow up with [facility] in 3 months, ROV with [facility] in 12  months.Domingo Dimes RN, BSN  Complexity Note: Imaging results reviewed. Results shared with Ms. Virtue, using Layman's terms.                         Meds   Current Outpatient Prescriptions:  .  colchicine 0.6 MG tablet, Take 0.6 mg by mouth 3 (three) times a week. , Disp: , Rfl:  .  diazepam (VALIUM) 10 MG tablet, Take 10 mg by mouth 2 (two) times daily as needed for anxiety., Disp: , Rfl:  .  metoprolol tartrate (LOPRESSOR) 50 MG tablet, Take 1 tablet (50 mg total) by mouth 2 (two) times daily., Disp: 180 tablet, Rfl: 3 .  Thyroid (LEVOTHYROXINE-LIOTHYRONINE) 30 MG TABS, Take 1 tablet by mouth daily. , Disp: , Rfl:  .  triamterene-hydrochlorothiazide (MAXZIDE) 75-50 MG tablet, Take 1 tablet by mouth daily., Disp: , Rfl:  .  warfarin (COUMADIN) 5 MG tablet, Take 5 mg by mouth daily. I tab 1 day a week All other days 1/2 tab, Disp: , Rfl:  .  oxyCODONE (OXY IR/ROXICODONE) 5 MG immediate release tablet, Take 1 tablet (5 mg total) by mouth 5 (five) times daily as needed for severe pain., Disp: 150 tablet, Rfl: 0 .  [START ON 01/04/2017] oxyCODONE (OXY IR/ROXICODONE) 5 MG immediate release  tablet, Take 1 tablet (5 mg total) by mouth 5 (five) times daily as needed for severe pain., Disp: 150 tablet, Rfl: 0 .  [START ON 02/03/2017] oxyCODONE (OXY IR/ROXICODONE) 5 MG immediate release tablet, Take 1 tablet (5 mg total) by mouth 5 (five) times daily as needed for severe pain., Disp: 150 tablet, Rfl: 0 .  Wheat Dextrin (BENEFIBER) POWD, Stir 2 tsp. TID into 4-8 oz of any non-carbonated beverage or soft food (hot or cold), Disp: 500 g, Rfl: 2  ROS  Constitutional: Denies any fever or chills Gastrointestinal: No reported hemesis, hematochezia, vomiting, or acute GI distress Musculoskeletal: Denies any acute onset joint swelling, redness, loss of ROM, or weakness Neurological: No reported episodes of acute onset apraxia, aphasia, dysarthria, agnosia, amnesia, paralysis, loss of coordination, or loss of consciousness  Allergies  Ms. Schoenfelder is allergic to contrast media [iodinated diagnostic agents].  PFSH  Drug: Ms. Kiger  reports that she does not use drugs. Alcohol:  reports that she does not drink alcohol. Tobacco:  reports that she has never smoked. She has never used smokeless tobacco. Medical:  has a past medical history of Anxiety; Aortic insufficiency; Arthritis; Asthma; Bronchitis; Bursitis, hip; Claustrophobia; Claustrophobia; Dysrhythmia; GERD (gastroesophageal reflux disease); Gout; Hyperlipidemia; Hypertension; Hypothyroidism; Mitral regurgitation; Obesity; Panic attacks; Paroxysmal atrial fibrillation (H[facility]); PTSD (post-traumatic stress disorder); Rhinitis; Sick sinus syndrome ([place]); and Sleep apnea. Surgical: Ms. Julin  has a past surgical history that includes Tonsillectomy; Pacemaker insertion; Status post child birth x1; Rotator cuff repair; Dilatation & curettage/hysteroscopy with myosure (N/A, 08/03/2015); Insert / replace / remove pacemaker; and Total hip arthroplasty (Left, 12/22/2015). Family: family history includes Alzheimer's disease in her father; Arthritis in her  sister; Cancer in her brother and maternal grandfather; Heart disease in her mother; Hypertension in her sister; Stroke in her mother.  Constitutional Exam  General appearance: Well nourished, well developed, and well hydrated. In no apparent acute distress Vitals:   12/05/16 0835  BP: (!) 142/59  Pulse: 61  Resp: 18  Temp: 97.9 F (36.6 C)  SpO2: 100%  Weight: 209 lb (94.8 kg)  Height: 5' 1"  (1.549 m)   BMI Assessment: Estimated body mass index is 39.49 kg/m as calculated from the following:   Height as of this encounter: 5' 1"  (1.549 m).   Weight as of this encounter: 209 lb (94.8 kg).  Psych/Mental status: Alert, oriented x 3 (person, place, & time)       Eyes: PERLA Respiratory: No evidence of acute respiratory distress  Cervical Spine Area Exam  Skin & Axial Inspection: No masses, redness, edema, swelling, or associated skin lesions Alignment: Symmetrical Functional ROM: Unrestricted ROM      Stability: No instability detected Muscle Tone/Strength: Functionally intact. No obvious neuro-muscular anomalies detected. Sensory (Neurological): Unimpaired Palpation: No palpable anomalies              Upper Extremity (UE) Exam    Side: Right upper extremity  Side: Left upper extremity  Skin & Extremity Inspection: Skin color, temperature, and hair growth are WNL. No peripheral edema or cyanosis. No masses, redness, swelling, asymmetry, or associated skin lesions. No contractures.  Skin & Extremity Inspection: Skin color, temperature, and hair growth are WNL. No peripheral edema or cyanosis. No masses, redness, swelling, asymmetry, or associated skin lesions. No contractures.  Functional ROM: Unrestricted ROM          Functional ROM: Unrestricted ROM          Muscle Tone/Strength: Functionally intact. No obvious neuro-muscular anomalies detected.  Muscle Tone/Strength: Functionally intact. No obvious neuro-muscular anomalies detected.  Sensory (Neurological): Unimpaired           Sensory (Neurological): Unimpaired          Palpation: No palpable anomalies              Palpation: No palpable anomalies              Specialized Test(s): Deferred         Specialized Test(s): Deferred          Thoracic Spine Area Exam  Skin & Axial Inspection: No masses, redness, or swelling Alignment: Symmetrical Functional ROM: Unrestricted ROM Stability: No instability detected Muscle Tone/Strength: Functionally intact. No obvious neuro-muscular anomalies detected. Sensory (Neurological): Unimpaired Muscle strength & Tone: No palpable anomalies  Lumbar Spine  Area Exam  Skin & Axial Inspection: No masses, redness, or swelling Alignment: Symmetrical Functional ROM: Unrestricted ROM      Stability: No instability detected Muscle Tone/Strength: Functionally intact. No obvious neuro-muscular anomalies detected. Sensory (Neurological): Unimpaired Palpation: No palpable anomalies       Provocative Tests: Lumbar Hyperextension and rotation test: evaluation deferred today       Lumbar Lateral bending test: evaluation deferred today       Patrick's Maneuver: evaluation deferred today                    Gait & Posture Assessment  Ambulation: Patient ambulates using a cane Gait: Relatively normal for age and body habitus Posture: WNL   Lower Extremity Exam    Side: Right lower extremity  Side: Left lower extremity  Skin & Extremity Inspection: Skin color, temperature, and hair growth are WNL. No peripheral edema or cyanosis. No masses, redness, swelling, asymmetry, or associated skin lesions. No contractures.  Skin & Extremity Inspection: Skin color, temperature, and hair growth are WNL. No peripheral edema or cyanosis. No masses, redness, swelling, asymmetry, or associated skin lesions. No contractures.  Functional ROM: Unrestricted ROM          Functional ROM: Decreased ROM for hip and knee joints  Muscle Tone/Strength: Functionally intact. No obvious neuro-muscular anomalies detected.   Muscle Tone/Strength: Functionally intact. No obvious neuro-muscular anomalies detected.  Sensory (Neurological): Unimpaired  Sensory (Neurological): Unimpaired  Palpation: No palpable anomalies  Palpation: No palpable anomalies   Assessment  Primary Diagnosis & Pertinent Problem List: The primary encounter diagnosis was Chronic hip pain (Location of Primary Source of Pain) (Left). Diagnoses of Lumbar facet arthropathy ([place]), Spinal stenosis of lumbar region with neurogenic claudication, Chronic pain syndrome, and Long term (current) use of opiate analgesic were also pertinent to this visit.  Status Diagnosis  Controlled Controlled Controlled 1. Chronic hip pain (Location of Primary Source of Pain) (Left)   2. Lumbar facet arthropathy (HCC)   3. Spinal stenosis of lumbar region with neurogenic claudication   4. Chronic pain syndrome   5. Long term (current) use of opiate analgesic     Problems updated and reviewed during this visit: No problems updated. Plan of Care  Pharmacotherapy (Medications Ordered): Meds ordered this encounter  Medications  . oxyCODONE (OXY IR/ROXICODONE) 5 MG immediate release tablet    Sig: Take 1 tablet (5 mg total) by mouth 5 (five) times daily as needed for severe pain.    Dispense:  150 tablet    Refill:  0    Patient may have prescription filled one day early if pharmacy is closed on scheduled refill date. Do not fill until: 12/05/2016 To last until: 01/04/2017    Order Specific Question:   Supervising Provider    Answer:   Milinda Pointer [phone]  . oxyCODONE (OXY IR/ROXICODONE) 5 MG immediate release tablet    Sig: Take 1 tablet (5 mg total) by mouth 5 (five) times daily as needed for severe pain.    Dispense:  150 tablet    Refill:  0    Patient may have prescription filled one day early if pharmacy is closed on scheduled refill date. Do not fill until: 01/04/2017 To last until: 02/03/2017    Order Specific Question:   Supervising Provider     Answer:   Milinda Pointer [phone]  . oxyCODONE (OXY IR/ROXICODONE) 5 MG immediate release tablet    Sig: Take 1 tablet (5 mg total) by mouth  5 (five) times daily as needed for severe pain.    Dispense:  150 tablet    Refill:  0    Patient may have prescription filled one day early if pharmacy is closed on scheduled refill date. Do not fill until: 02/03/2017 To last until: 03/05/2017    Order Specific Question:   Supervising Provider    Answer:   Milinda Pointer [phone]   New Prescriptions   No medications on file   Medications administered today: Ms. Matthies had no medications administered during this visit. Lab-work, procedure(s), and/or referral(s): Orders Placed This Encounter  Procedures  . ToxASSURE Select 13 (MW), Urine   Imaging and/or referral(s): None  Interventional therapies: Planned, scheduled, and/or pending:   Not at this time.   Considering:   Not this time   Palliative PRN treatment(s):   ot at this time   Provider-requested follow-up: Return in about 3 months (around 03/07/2017) for [facility].  Future Appointments Date Time Provider [place]  03/07/2017 9:45 AM Vevelyn Francois, NP [facility] None   Primary Care Physician: No primary care provider on file. Location: [facility] Note by: Vevelyn Francois NP Date: 12/05/2016; Time: 9:50 AM  Pain Score Disclaimer: We use the NRS-11 scale. This is a self-reported, subjective measurement of pain severity with only modest accuracy. It is used primarily to identify changes within a particular patient. It must be understood that outpatient pain scales are significantly less accurate that those used for research, where they can be applied under ideal controlled circumstances with minimal exposure to variables. In reality, the score is likely to be a combination of pain intensity and pain affect, where pain affect describes the degree of emotional arousal or changes in action  readiness caused by the sensory experience of pain. Factors such as social and work situation, setting, emotional state, anxiety levels, expectation, and prior pain experience may influence pain perception and show large inter-individual differences that may also be affected by time variables.  Patient instructions provided during this appointment: Patient Instructions    ____________________________________________________________________________________________  Medication Rules  Applies to: All patients receiving prescriptions (written or electronic).  Pharmacy of record: Pharmacy where electronic prescriptions will be sent. If written prescriptions are taken to a different pharmacy, please inform the nursing staff. The pharmacy listed in the electronic medical record should be the one where you would like electronic prescriptions to be sent.  Prescription refills: Only during scheduled appointments. Applies to both, written and electronic prescriptions.  NOTE: The following applies primarily to controlled substances (Opioid* Pain Medications).   Patient's responsibilities: 1. Pain Pills: Bring all pain pills to every appointment (except for procedure appointments). 2. Pill Bottles: Bring pills in original pharmacy bottle. Always bring newest bottle. Bring bottle, even if empty. 3. Medication refills: You are responsible for knowing and keeping track of what medications you need refilled. The day before your appointment, write a list of all prescriptions that need to be refilled. Bring that list to your appointment and give it to the admitting nurse. Prescriptions will be written only during appointments. If you forget a medication, it will not be "Called in", "Faxed", or "electronically sent". You will need to get another appointment to get these prescribed. 4. Prescription Accuracy: You are responsible for carefully inspecting your prescriptions before leaving our office. Have the  discharge nurse carefully go over each prescription with you, before taking them home. Make sure that your name is accurately spelled, that your address is correct.  Check the name and dose of your medication to make sure it is accurate. Check the number of pills, and the written instructions to make sure they are clear and accurate. Make sure that you are given enough medication to last until your next medication refill appointment. 5. Taking Medication: Take medication as prescribed. Never take more pills than instructed. Never take medication more frequently than prescribed. Taking less pills or less frequently is permitted and encouraged, when it comes to controlled substances (written prescriptions).  6. Inform other Doctors: Always inform, all of your healthcare providers, of all the medications you take. 7. Pain Medication from other Providers: You are not allowed to accept any additional pain medication from any other Doctor or Healthcare provider. There are two exceptions to this rule. (see below) In the event that you require additional pain medication, you are responsible for notifying us, as stated below. 8. Medication Agreement: You are responsible for carefully reading and following our Medication Agreement. This must be signed before receiving any prescriptions from our practice. Safely store a copy of your signed Agreement. Violations to the Agreement will result in no further prescriptions. (Additional copies of our Medication Agreement are available upon request.) 9. Laws, Rules, & Regulations: All patients are expected to follow all Federal and [employer], [employer], Rules, [employer]. Ignorance of the Laws does not constitute a valid excuse. The use of any illegal substances is prohibited. 10. Adopted CDC guidelines & recommendations: Target dosing levels will be at or below 60 MME/day. Use of benzodiazepines** is not recommended.  Exceptions: There are only two exceptions to the rule of  not receiving pain medications from other Healthcare Providers. 1. Exception #1 (Emergencies): In the event of an emergency (i.e.: accident requiring emergency care), you are allowed to receive additional pain medication. However, you are responsible for: As soon as you are able, call our office [phone], at any time of the day or night, and leave a message stating your name, the date and nature of the emergency, and the name and dose of the medication prescribed. In the event that your call is answered by a member of our staff, make sure to document and save the date, time, and the name of the person that took your information.  2. Exception #2 (Planned Surgery): In the event that you are scheduled by another doctor or dentist to have any type of surgery or procedure, you are allowed (for a period no longer than 30 days), to receive additional pain medication, for the acute post-op pain. However, in this case, you are responsible for picking up a copy of our "Post-op Pain Management for Surgeons" handout, and giving it to your surgeon or dentist. This document is available at our office, and does not require an appointment to obtain it. Simply go to our office during business hours (Monday-Thursday from 8:00 AM to 4:00 PM) (Friday 8:00 AM to 12:00 Noon) or if you have a scheduled appointment with [place], prior to your surgery, and ask for it by name. In addition, you will need to provide [place] with your name, name of your surgeon, type of surgery, and date of procedure or surgery.  *Opioid medications include: morphine, codeine, oxycodone, oxymorphone, hydrocodone, hydromorphone, meperidine, tramadol, tapentadol, buprenorphine, fentanyl, methadone. **Benzodiazepine medications include: diazepam (Valium), alprazolam (Xanax), clonazepam (Klonopine), lorazepam (Ativan), clorazepate (Tranxene), chlordiazepoxide (Librium), estazolam (Prosom), oxazepam (Serax), temazepam (Restoril), triazolam  (Halcion)  ____________________________________________________________________________________________  BMI Assessment: Estimated body mass index is 39.49 kg/m as calculated from the  following:   Height as of this encounter: 5' 1"  (1.549 m).   Weight as of this encounter: 209 lb (94.8 kg).  BMI interpretation table: BMI level Category Range association with higher incidence of chronic pain  <18 kg/m2 Underweight   18.5-24.9 kg/m2 Ideal body weight   25-29.9 kg/m2 Overweight Increased incidence by 20%  30-34.9 kg/m2 Obese (Class I) Increased incidence by 68%  35-39.9 kg/m2 Severe obesity (Class II) Increased incidence by 136%  >40 kg/m2 Extreme obesity (Class III) Increased incidence by 254%   BMI Readings from Last 4 Encounters:  12/05/16 39.49 kg/m  08/24/16 38.73 kg/m  08/18/16 40.25 kg/m  05/11/16 39.30 kg/m   Wt Readings from Last 4 Encounters:  12/05/16 209 lb (94.8 kg)  08/24/16 205 lb (93 kg)  08/18/16 213 lb (96.6 kg)  05/11/16 208 lb (94.3 kg)

## 2016-12-05 NOTE — Patient Instructions (Addendum)
____________________________________________________________________________________________  Medication Rules  Applies to: All patients receiving prescriptions (written or electronic).  Pharmacy of record: Pharmacy where electronic prescriptions will be sent. If written prescriptions are taken to a different pharmacy, please inform the nursing staff. The pharmacy listed in the electronic medical record should be the one where you would like electronic prescriptions to be sent.  Prescription refills: Only during scheduled appointments. Applies to both, written and electronic prescriptions.  NOTE: The following applies primarily to controlled substances (Opioid* Pain Medications).   Patient's responsibilities: 1. Pain Pills: Bring all pain pills to every appointment (except for procedure appointments). 2. Pill Bottles: Bring pills in original pharmacy bottle. Always bring newest bottle. Bring bottle, even if empty. 3. Medication refills: You are responsible for knowing and keeping track of what medications you need refilled. The day before your appointment, write a list of all prescriptions that need to be refilled. Bring that list to your appointment and give it to the admitting nurse. Prescriptions will be written only during appointments. If you forget a medication, it will not be "Called in", "Faxed", or "electronically sent". You will need to get another appointment to get these prescribed. 4. Prescription Accuracy: You are responsible for carefully inspecting your prescriptions before leaving our office. Have the discharge nurse carefully go over each prescription with you, before taking them home. Make sure that your name is accurately spelled, that your address is correct. Check the name and dose of your medication to make sure it is accurate. Check the number of pills, and the written instructions to make sure they are clear and accurate. Make sure that you are given enough medication to  last until your next medication refill appointment. 5. Taking Medication: Take medication as prescribed. Never take more pills than instructed. Never take medication more frequently than prescribed. Taking less pills or less frequently is permitted and encouraged, when it comes to controlled substances (written prescriptions).  6. Inform other Doctors: Always inform, all of your healthcare providers, of all the medications you take. 7. Pain Medication from other Providers: You are not allowed to accept any additional pain medication from any other Doctor or Healthcare provider. There are two exceptions to this rule. (see below) In the event that you require additional pain medication, you are responsible for notifying us, as stated below. 8. Medication Agreement: You are responsible for carefully reading and following our Medication Agreement. This must be signed before receiving any prescriptions from our practice. Safely store a copy of your signed Agreement. Violations to the Agreement will result in no further prescriptions. (Additional copies of our Medication Agreement are available upon request.) 9. Laws, Rules, & Regulations: All patients are expected to follow all Federal and State Laws, Statutes, Rules, & Regulations. Ignorance of the Laws does not constitute a valid excuse. The use of any illegal substances is prohibited. 10. Adopted CDC guidelines & recommendations: Target dosing levels will be at or below 60 MME/day. Use of benzodiazepines** is not recommended.  Exceptions: There are only two exceptions to the rule of not receiving pain medications from other Healthcare Providers. 1. Exception #1 (Emergencies): In the event of an emergency (i.e.: accident requiring emergency care), you are allowed to receive additional pain medication. However, you are responsible for: As soon as you are able, call our office [phone], at any time of the day or night, and leave a message stating your  name, the date and nature of the emergency, and the name and dose of the medication   prescribed. In the event that your call is answered by a member of our staff, make sure to document and save the date, time, and the name of the person that took your information.  2. Exception #2 (Planned Surgery): In the event that you are scheduled by another doctor or dentist to have any type of surgery or procedure, you are allowed (for a period no longer than 30 days), to receive additional pain medication, for the acute post-op pain. However, in this case, you are responsible for picking up a copy of our "Post-op Pain Management for Surgeons" handout, and giving it to your surgeon or dentist. This document is available at our office, and does not require an appointment to obtain it. Simply go to our office during business hours (Monday-Thursday from 8:00 AM to 4:00 PM) (Friday 8:00 AM to 12:00 Noon) or if you have a scheduled appointment with [place], prior to your surgery, and ask for it by name. In addition, you will need to provide [place] with your name, name of your surgeon, type of surgery, and date of procedure or surgery.  *Opioid medications include: morphine, codeine, oxycodone, oxymorphone, hydrocodone, hydromorphone, meperidine, tramadol, tapentadol, buprenorphine, fentanyl, methadone. **Benzodiazepine medications include: diazepam (Valium), alprazolam (Xanax), clonazepam (Klonopine), lorazepam (Ativan), clorazepate (Tranxene), chlordiazepoxide (Librium), estazolam (Prosom), oxazepam (Serax), temazepam (Restoril), triazolam (Halcion)  ____________________________________________________________________________________________  BMI Assessment: Estimated body mass index is 39.49 kg/m as calculated from the following:   Height as of this encounter: 5\' 1"  (1.549 m).   Weight as of this encounter: 209 lb (94.8 kg).  BMI interpretation table: BMI level Category Range association with higher incidence of chronic pain   <18 kg/m2 Underweight   18.5-24.9 kg/m2 Ideal body weight   25-29.9 kg/m2 Overweight Increased incidence by 20%  30-34.9 kg/m2 Obese (Class I) Increased incidence by 68%  35-39.9 kg/m2 Severe obesity (Class II) Increased incidence by 136%  >40 kg/m2 Extreme obesity (Class III) Increased incidence by 254%   BMI Readings from Last 4 Encounters:  12/05/16 39.49 kg/m  08/24/16 38.73 kg/m  08/18/16 40.25 kg/m  05/11/16 39.30 kg/m   Wt Readings from Last 4 Encounters:  12/05/16 209 lb (94.8 kg)  08/24/16 205 lb (93 kg)  08/18/16 213 lb (96.6 kg)  05/11/16 208 lb (94.3 kg)

## 2016-12-06 NOTE — Telephone Encounter (Signed)
Returned Pt call.  Left detailed message per DPR. Notified Pt that follow up appt for March 08, 2017 was ok.  Notified Pt to call office if she was having any medical issues that would require her to be seen sooner.  Left this nurse name and # for call back if Pt has any questions.

## 2016-12-10 LAB — TOXASSURE SELECT 13 (MW), URINE

## 2016-12-14 DIAGNOSIS — Z96642 Presence of left artificial hip joint: Secondary | ICD-10-CM | POA: Diagnosis not present

## 2016-12-14 DIAGNOSIS — M25562 Pain in left knee: Secondary | ICD-10-CM | POA: Diagnosis not present

## 2016-12-19 DIAGNOSIS — I482 Chronic atrial fibrillation: Secondary | ICD-10-CM | POA: Diagnosis not present

## 2016-12-19 DIAGNOSIS — Z7901 Long term (current) use of anticoagulants: Secondary | ICD-10-CM | POA: Diagnosis not present

## 2017-01-23 DIAGNOSIS — Z7901 Long term (current) use of anticoagulants: Secondary | ICD-10-CM | POA: Diagnosis not present

## 2017-01-23 DIAGNOSIS — I482 Chronic atrial fibrillation: Secondary | ICD-10-CM | POA: Diagnosis not present

## 2017-02-20 DIAGNOSIS — Z7901 Long term (current) use of anticoagulants: Secondary | ICD-10-CM | POA: Diagnosis not present

## 2017-02-20 DIAGNOSIS — I482 Chronic atrial fibrillation: Secondary | ICD-10-CM | POA: Diagnosis not present

## 2017-02-26 IMAGING — CT CT HIP*L* W/O CM
2 of 3 series · 17 of 46 positions shown, 19 images · non-contrast
Comparison: None.

CLINICAL DATA: Chronic left hip pain.

EXAM:
CT OF THE LEFT HIP WITHOUT CONTRAST
TECHNIQUE: Multidetector CT imaging of the left hip was performed according to
the standard protocol. Multiplanar CT image reconstructions were
also generated.

[Series 3: hip st · axial · 0.49mm/px · z∈[-140,+0]mm · 14 of 34 slices shown, 16 images]
[im 3/34  soft-tissue]
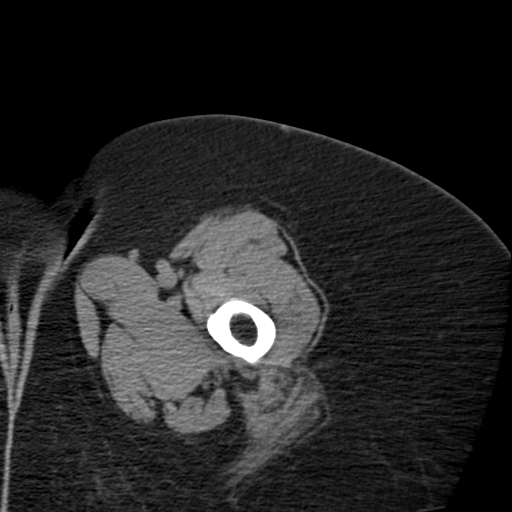
[im 3/34  bone]
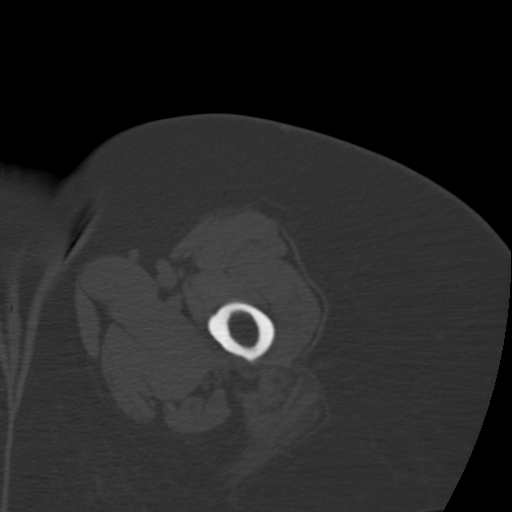
[im 5/34  soft-tissue]
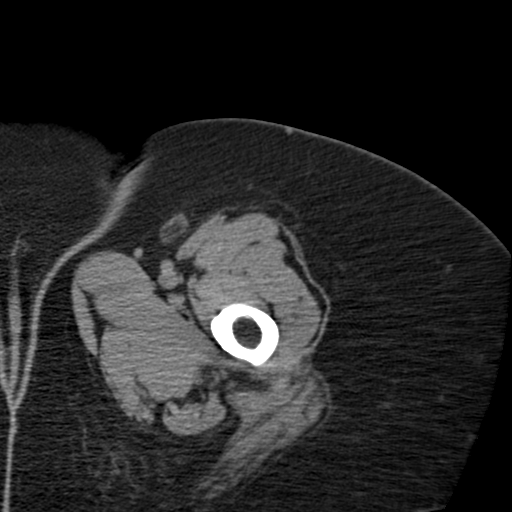
[im 7/34  soft-tissue]
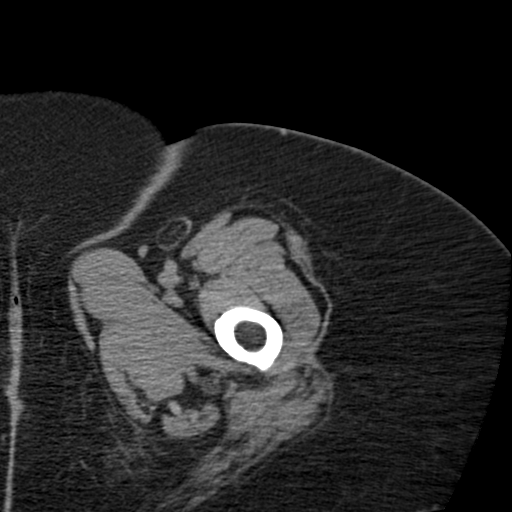
[im 9/34  soft-tissue]
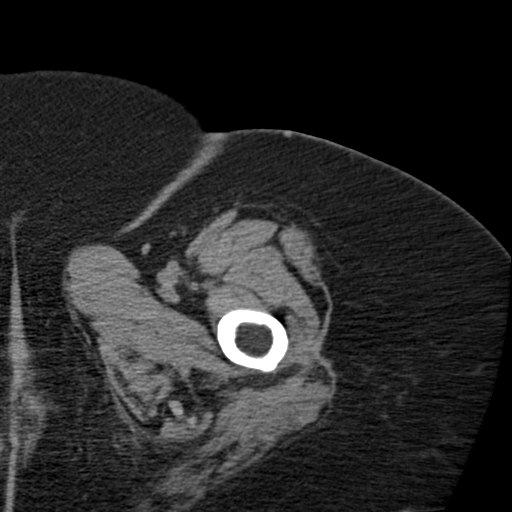
[im 11/34  soft-tissue]
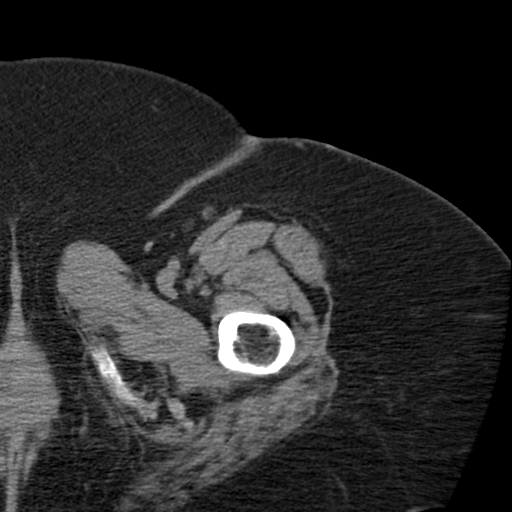
[im 13/34  soft-tissue]
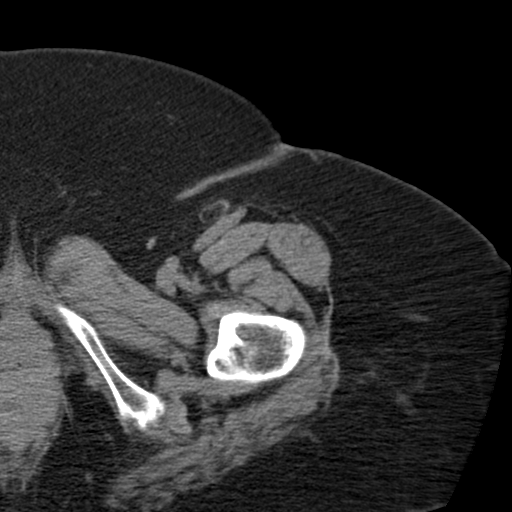
[im 15/34  soft-tissue]
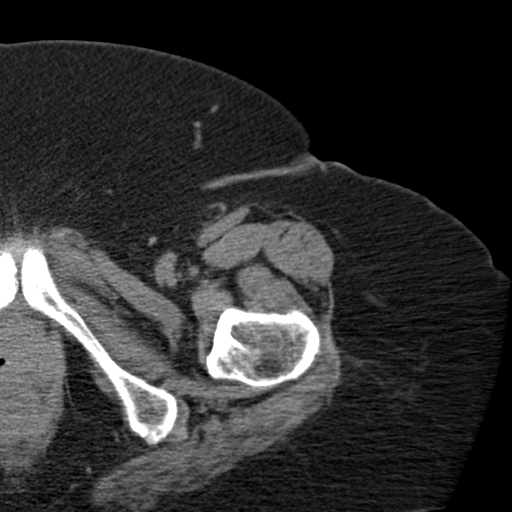
[im 19/34  soft-tissue]
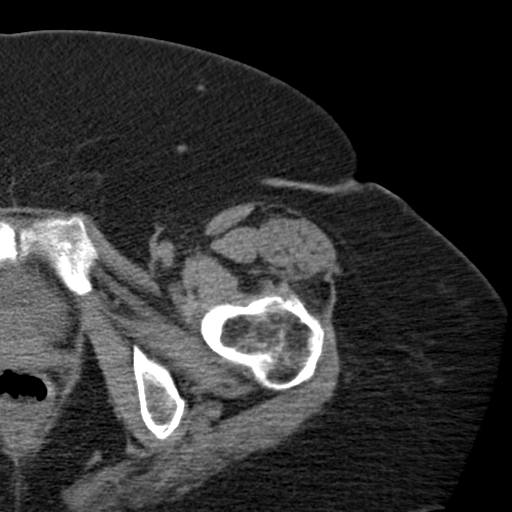
[im 21/34  soft-tissue]
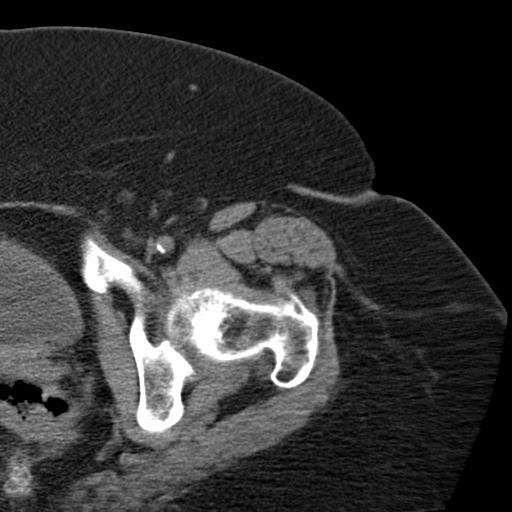
[im 21/34  bone]
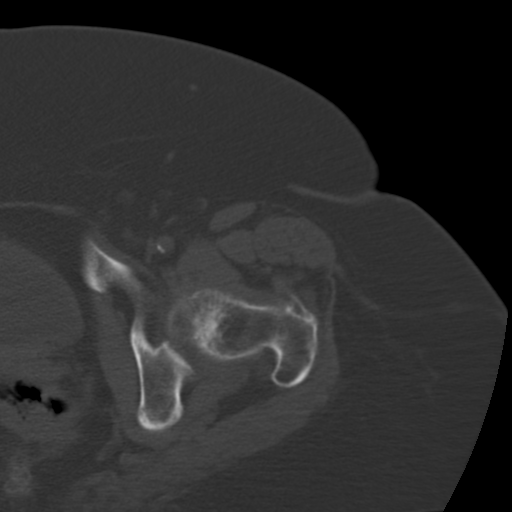
[im 23/34  soft-tissue]
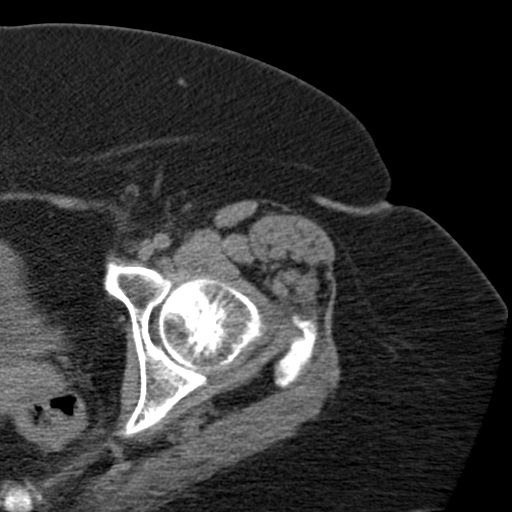
[im 25/34  soft-tissue]
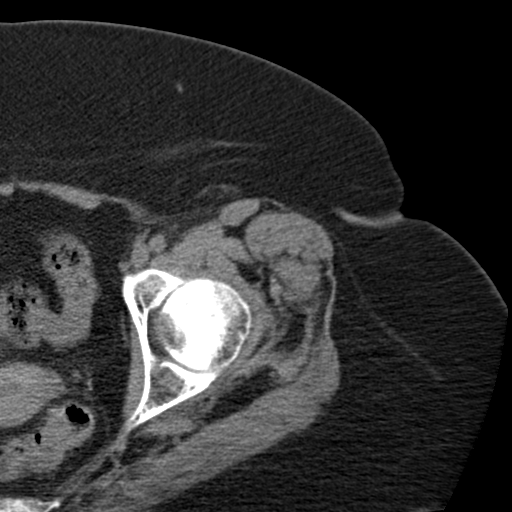
[im 27/34  soft-tissue]
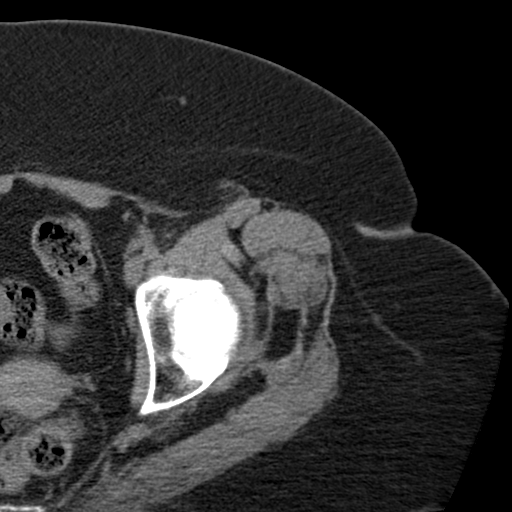
[im 29/34  soft-tissue]
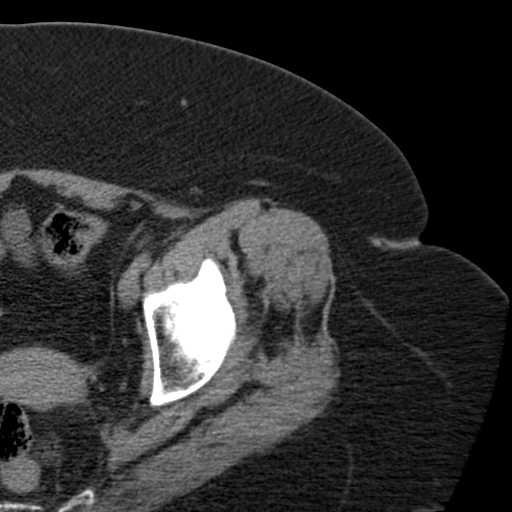
[im 31/34  soft-tissue]
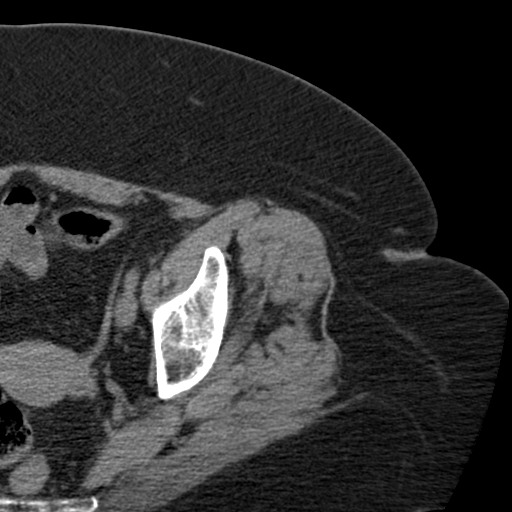

[Series 604: st cor · coronal · 0.49mm/px · 3 of 73 slices shown]
[im 25/73  soft-tissue]
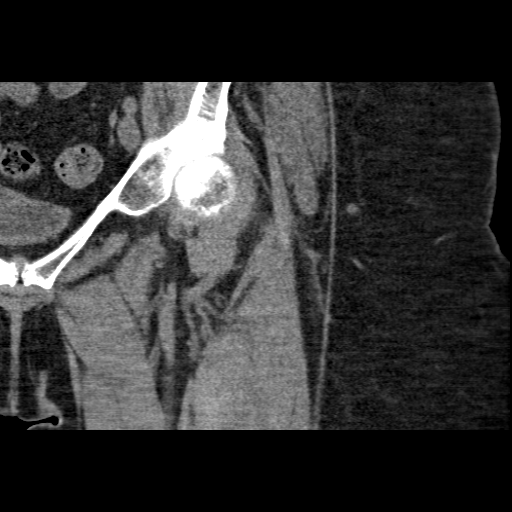
[im 33/73  soft-tissue]
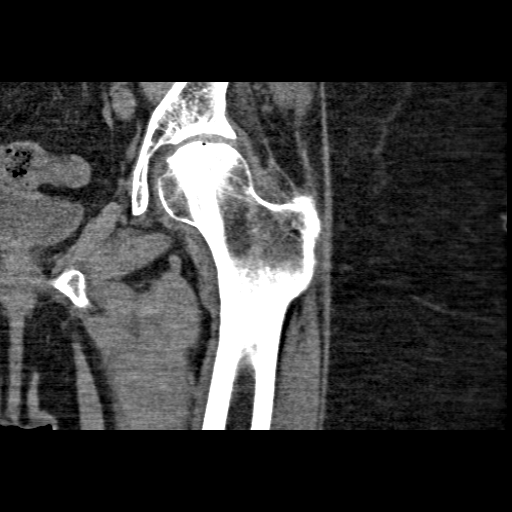
[im 41/73  soft-tissue]
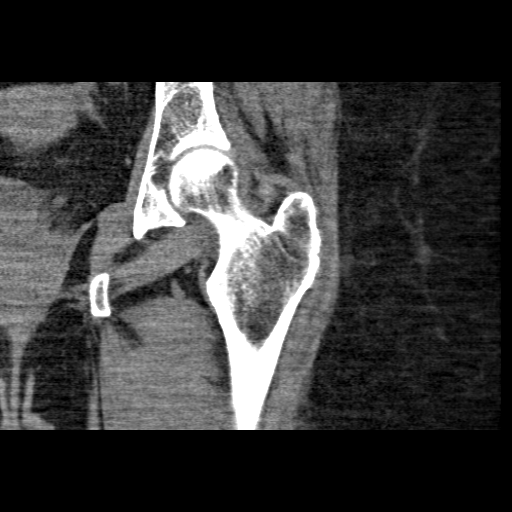

[17 of 46 positions shown; findings below may reference images not displayed]

FINDINGS: Bones/Joint/Cartilage

Increased sclerosis in the superior left femoral head consistent
with avascular necrosis with minimal articular surface collapse
(image 28/series 602).

No acute fracture or dislocation.

Mild osteoarthritis of the left hip. Mild degenerative changes of
the pubic symphysis. Normal alignment. No joint effusion.

Muscles

Normal.

Soft tissue
No fluid collection or hematoma.  No soft tissue mass.
IMPRESSION: 1. Avascular necrosis of the left femoral head with mild articular
surface collapse. Mild osteoarthritis of the left hip.

## 2017-03-07 ENCOUNTER — Encounter: Payer: Self-pay | Admitting: Nurse Practitioner

## 2017-03-07 ENCOUNTER — Ambulatory Visit: Payer: Medicare Other | Attending: Nurse Practitioner | Admitting: Nurse Practitioner

## 2017-03-07 ENCOUNTER — Other Ambulatory Visit: Payer: Self-pay

## 2017-03-07 VITALS — BP 146/67 | HR 72 | Temp 97.8°F | Resp 18 | Ht 61.0 in | Wt 209.0 lb

## 2017-03-07 DIAGNOSIS — Z5181 Encounter for therapeutic drug level monitoring: Secondary | ICD-10-CM | POA: Diagnosis not present

## 2017-03-07 DIAGNOSIS — M25562 Pain in left knee: Secondary | ICD-10-CM | POA: Insufficient documentation

## 2017-03-07 DIAGNOSIS — N183 Chronic kidney disease, stage 3 unspecified: Secondary | ICD-10-CM

## 2017-03-07 DIAGNOSIS — M48062 Spinal stenosis, lumbar region with neurogenic claudication: Secondary | ICD-10-CM | POA: Insufficient documentation

## 2017-03-07 DIAGNOSIS — K5900 Constipation, unspecified: Secondary | ICD-10-CM | POA: Diagnosis not present

## 2017-03-07 DIAGNOSIS — K219 Gastro-esophageal reflux disease without esophagitis: Secondary | ICD-10-CM | POA: Insufficient documentation

## 2017-03-07 DIAGNOSIS — Z79891 Long term (current) use of opiate analgesic: Secondary | ICD-10-CM | POA: Insufficient documentation

## 2017-03-07 DIAGNOSIS — T402X5A Adverse effect of other opioids, initial encounter: Secondary | ICD-10-CM | POA: Diagnosis not present

## 2017-03-07 DIAGNOSIS — Z79899 Other long term (current) drug therapy: Secondary | ICD-10-CM | POA: Diagnosis not present

## 2017-03-07 DIAGNOSIS — J40 Bronchitis, not specified as acute or chronic: Secondary | ICD-10-CM | POA: Insufficient documentation

## 2017-03-07 DIAGNOSIS — G894 Chronic pain syndrome: Secondary | ICD-10-CM | POA: Diagnosis not present

## 2017-03-07 DIAGNOSIS — M899 Disorder of bone, unspecified: Secondary | ICD-10-CM | POA: Insufficient documentation

## 2017-03-07 DIAGNOSIS — M25512 Pain in left shoulder: Secondary | ICD-10-CM | POA: Insufficient documentation

## 2017-03-07 DIAGNOSIS — F419 Anxiety disorder, unspecified: Secondary | ICD-10-CM | POA: Insufficient documentation

## 2017-03-07 DIAGNOSIS — K5903 Drug induced constipation: Secondary | ICD-10-CM | POA: Diagnosis not present

## 2017-03-07 DIAGNOSIS — Z823 Family history of stroke: Secondary | ICD-10-CM | POA: Diagnosis not present

## 2017-03-07 DIAGNOSIS — Z8249 Family history of ischemic heart disease and other diseases of the circulatory system: Secondary | ICD-10-CM | POA: Diagnosis not present

## 2017-03-07 DIAGNOSIS — I129 Hypertensive chronic kidney disease with stage 1 through stage 4 chronic kidney disease, or unspecified chronic kidney disease: Secondary | ICD-10-CM | POA: Insufficient documentation

## 2017-03-07 DIAGNOSIS — Z789 Other specified health status: Secondary | ICD-10-CM | POA: Diagnosis not present

## 2017-03-07 DIAGNOSIS — Z7901 Long term (current) use of anticoagulants: Secondary | ICD-10-CM | POA: Diagnosis not present

## 2017-03-07 DIAGNOSIS — M47818 Spondylosis without myelopathy or radiculopathy, sacral and sacrococcygeal region: Secondary | ICD-10-CM

## 2017-03-07 DIAGNOSIS — M25552 Pain in left hip: Secondary | ICD-10-CM | POA: Diagnosis not present

## 2017-03-07 DIAGNOSIS — M461 Sacroiliitis, not elsewhere classified: Secondary | ICD-10-CM

## 2017-03-07 DIAGNOSIS — G8929 Other chronic pain: Secondary | ICD-10-CM

## 2017-03-07 MED ORDER — OXYCODONE HCL 5 MG PO TABS: 5.0000 mg | ORAL_TABLET | Freq: Every day | ORAL | 0 refills | Status: DC

## 2017-03-07 NOTE — Progress Notes (Signed)
Nursing Pain Medication Assessment:  Safety precautions to be maintained throughout the outpatient stay will include: orient to surroundings, keep bed in low position, maintain call bell within reach at all times, provide assistance with transfer out of bed and ambulation.  Medication Inspection Compliance: Pill count conducted under aseptic conditions, in front of the patient. Neither the pills nor the bottle was removed from the patient's sight at any time. Once count was completed pills were immediately returned to the patient in their original bottle.  Medication: Oxycodone IR Pill/Patch Count: 11 of 150 pills remain Pill/Patch Appearance: Markings consistent with prescribed medication Bottle Appearance: Standard pharmacy container. Clearly labeled. Filled Date: 65 / 08 / 2018 Last Medication intake:  Yesterday

## 2017-03-07 NOTE — Progress Notes (Signed)
Patient's Name: [name]  MRN: [id number]  Referring Provider: No ref. provider found  DOB: 22-Jan-1941  PCP: No primary care provider on file.  DOS: 03/07/2017  Note by: Vevelyn Francois NP  Service setting: Ambulatory outpatient  Specialty: Interventional Pain Management  Location: [facility] (AMB) Pain Management Facility    Patient type: Established    Primary Reason(s) for Visit: Encounter for prescription drug management. (Level of risk: moderate)  CC: Shoulder Pain (left); Knee Pain (left); and Hip Pain (left)  HPI  [name] is a 77 y.o. year old, female patient, who comes today for a medication management evaluation. She has HYPOTHYROIDISM; HYPERLIPIDEMIA; OBESITY; Essential hypertension; SICK SINUS SYNDROME; Cardiac pacemaker in situ; Cough; GERD (gastroesophageal reflux disease); Atrial fibrillation ([place]); Chronic hip pain (Location of Primary Source of Pain) (Left); Long term current use of opiate analgesic; Long term prescription opiate use; Opiate use (54 MME/Day); Encounter for therapeutic drug level monitoring; Disturbance of skin sensation; Osteoarthritis of hip (Left); Trochanteric bursitis of hip (Left); Lumbar central spinal stenosis (L4-5); Lumbar facet arthropathy ([place]); Osteoarthritis of sacroiliac joint (with vacuum phenomena in) (Bilateral); Avascular necrosis of left femoral head ([place]); Opioid-induced constipation (OIC); Hypothyroidism (acquired); Long term (current) use of opiate analgesic; Morbid obesity with BMI of 40.0-44.9, adult ([place]); PTSD (post-traumatic stress disorder); Spinal stenosis of lumbar region; S/P total hip arthroplasty; Chronic pain syndrome; Status post total replacement of left hip; CKD (chronic kidney disease) stage 3, GFR 30-59 ml/min ([place]); Anemia of chronic disease; and Hypocalcemia on their problem list. Her primarily concern today is the Shoulder Pain (left); Knee Pain (left); and Hip Pain (left)  Pain Assessment: Location: Left Hip Radiating:  knee Onset: More than a month ago Duration: Chronic pain Quality: Constant, Aching Severity: 4 /10 (self-reported pain score)  Note: Reported level is compatible with observation.                          Effect on ADL:   Timing: Constant Modifying factors: medications, heat, rest  [name] was last scheduled for an appointment on 12/05/2016 for medication management. During today's appointment we reviewed [name]'s chronic pain status, as well as her outpatient medication regimen. She continues to have her left shoulder and left hip pain. She denies any changes. She denies any concerns today.   The patient  reports that she does not use drugs. Her body mass index is 39.49 kg/m.  Further details on both, my assessment(s), as well as the proposed treatment plan, please see below.  Controlled Substance Pharmacotherapy Assessment REMS (Risk Evaluation and Mitigation Strategy)  Analgesic:Oxycodone IR 5 mg 1 tablet by mouth 5 times a day (25 mg/day) MME/day:37.33m/day  SHart Rochester RN  03/07/2017  9:28 AM  Sign at close encounter Nursing Pain Medication Assessment:  Safety precautions to be maintained throughout the outpatient stay will include: orient to surroundings, keep bed in low position, maintain call bell within reach at all times, provide assistance with transfer out of bed and ambulation.  Medication Inspection Compliance: Pill count conducted under aseptic conditions, in front of the patient. Neither the pills nor the bottle was removed from the patient's sight at any time. Once count was completed pills were immediately returned to the patient in their original bottle.  Medication: Oxycodone IR Pill/Patch Count: 11 of 150 pills remain Pill/Patch Appearance: Markings consistent with prescribed medication Bottle Appearance: Standard pharmacy container. Clearly labeled. Filled Date: 15/ 08 / 2018 Last Medication intake:  Yesterday   Pharmacokinetics: Liberation and  absorption (onset of action): WNL Distribution (time to peak effect): WNL Metabolism and excretion (duration of action): WNL         Pharmacodynamics: Desired effects: Analgesia: [name] reports >50% benefit. Functional ability: Patient reports that medication allows her to accomplish basic ADLs Clinically meaningful improvement in function (CMIF): Sustained CMIF goals met Perceived effectiveness: Described as relatively effective, allowing for increase in activities of daily living (ADL) Undesirable effects: Side-effects or Adverse reactions: None reported Monitoring: [place] PMP: Online review of the past 39-monthperiod conducted. Compliant with practice rules and regulations Last UDS on record: Summary  Date Value Ref Range Status  12/05/2016 FINAL  Final    Comment:    ==================================================================== TOXASSURE SELECT 13 (MW) ==================================================================== Test                             Result       Flag       Units Drug Present and Declared for Prescription Verification   Oxazepam                       30           EXPECTED   ng/mg creat   Temazepam                      17           EXPECTED   ng/mg creat    Oxazepam and temazepam are expected metabolites of diazepam.    Oxazepam is also an expected metabolite of other benzodiazepine    drugs, including chlordiazepoxide, prazepam, clorazepate,    halazepam, and temazepam.  Oxazepam and temazepam are available    as scheduled prescription medications.   Oxycodone                      567          EXPECTED   ng/mg creat   Oxymorphone                    148          EXPECTED   ng/mg creat   Noroxycodone                   1602         EXPECTED   ng/mg creat   Noroxymorphone                 2684         EXPECTED   ng/mg creat    Sources of oxycodone are scheduled prescription medications.    Oxymorphone, noroxycodone, and noroxymorphone are expected     metabolites of oxycodone. Oxymorphone is also available as a    scheduled prescription medication. ==================================================================== Test                      Result    Flag   Units      Ref Range   Creatinine              141              mg/dL      >=20 ==================================================================== Declared Medications:  The flagging and interpretation on this report are based on the  following declared medications.  Unexpected results may arise from  inaccuracies in  the declared medications.  **Note: The testing scope of this panel includes these medications:  Diazepam (Valium)  Oxycodone (Roxicodone)  **Note: The testing scope of this panel does not include following  reported medications:  Colchicine  Hydrochlorothiazide (Maxzide)  Levothyroxine  Metoprolol (Lopressor)  Supplement  Triamterene (Maxzide)  Warfarin ==================================================================== For clinical consultation, please call [phone]. ====================================================================    UDS interpretation: Compliant          Medication Assessment Form: Reviewed. Patient indicates being compliant with therapy Treatment compliance: Compliant Risk Assessment Profile: Aberrant behavior: See prior evaluations. None observed or detected today Comorbid factors increasing risk of overdose: See prior notes. No additional risks detected today Risk of substance use disorder (SUD): Low Opioid Risk Tool - 03/07/17 0935      Family History of Substance Abuse   Alcohol  Negative    Illegal Drugs  Negative    Rx Drugs  Negative      Personal History of Substance Abuse   Alcohol  Negative    Illegal Drugs  Negative    Rx Drugs  Negative      Age   Age between 82-45 years   No      History of Preadolescent Sexual Abuse   History of Preadolescent Sexual Abuse  Negative or Female      Psychological Disease    Psychological Disease  Negative    Depression  Negative      Total Score   Opioid Risk Tool Scoring  0    Opioid Risk Interpretation  Low Risk      ORT Scoring interpretation table:  Score <3 = Low Risk for SUD  Score between 4-7 = Moderate Risk for SUD  Score >8 = High Risk for Opioid Abuse   Risk Mitigation Strategies:  Patient Counseling: Covered Patient-Prescriber Agreement (PPA): Present and active  Notification to other healthcare providers: Done  Pharmacologic Plan: No change in therapy, at this time.             Laboratory Chemistry  Inflammation Markers (CRP: Acute Phase) (ESR: Chronic Phase) Lab Results  Component Value Date   ESRSEDRATE 28 12/08/2015                 Rheumatology Markers No results found for: RF, ANA, LABURIC, URICUR, LYMEIGGIGMAB, LYMEABIGMQN              Renal Function Markers Lab Results  Component Value Date   BUN 35 (H) 08/18/2016   CREATININE 1.17 (H) 08/18/2016   GFRAA 52 (L) 08/18/2016   GFRNONAA 45 (L) 08/18/2016                 Hepatic Function Markers Lab Results  Component Value Date   AST 32 05/11/2016   ALT 19 05/11/2016   ALBUMIN 4.1 05/11/2016   ALKPHOS 47 05/11/2016                 Electrolytes Lab Results  Component Value Date   NA 139 08/18/2016   K 4.2 08/18/2016   CL 96 08/18/2016   CALCIUM 9.8 08/18/2016                 Neuropathy Markers Lab Results  Component Value Date   VITAMINB12 >7,500 (H) 09/30/2015                 Bone Pathology Markers Lab Results  Component Value Date   25OHVITD1 42 09/30/2015   25OHVITD2 7.3 09/30/2015   25OHVITD3 35 09/30/2015  Coagulation Parameters Lab Results  Component Value Date   INR 1.78 12/24/2015   LABPROT 20.9 (H) 12/24/2015   APTT 36 12/08/2015   PLT 159 08/18/2016                 Cardiovascular Markers Lab Results  Component Value Date   TROPONINI 0.04        NO INDICATION OF MYOCARDIAL INJURY. 05/22/2009   HGB 13.8  08/18/2016   HCT 40.2 08/18/2016                 CA Markers No results found for: CEA, CA125, LABCA2               Note: Lab results reviewed.  Recent Diagnostic Imaging Results  Implantable device check Pacemaker check in clinic. Normal device function. Threshold, sensing, impedance consistent with previous measurements. Device programmed to maximize longevity. Permanent AF + Warfarin. Episode triggers are off. Device programmed at appropriate safety  margins. Histogram distribution appropriate for patient activity level. Device programmed to optimize intrinsic conduction. Estimated longevity 3.25-4 years. Patient education completed. Patient will follow up with [facility] in 3 months, ROV with [facility] in 12  months.Domingo Dimes RN, BSN  Complexity Note: Imaging results reviewed. Results shared with [name], using Layman's terms.                         Meds   Current Outpatient Medications:  .  colchicine 0.6 MG tablet, Take 0.6 mg by mouth 3 (three) times a week. , Disp: , Rfl:  .  diazepam (VALIUM) 10 MG tablet, Take 10 mg by mouth 2 (two) times daily as needed for anxiety., Disp: , Rfl:  .  metoprolol tartrate (LOPRESSOR) 50 MG tablet, Take 1 tablet (50 mg total) by mouth 2 (two) times daily., Disp: 180 tablet, Rfl: 3 .  oxycodone (OXY-IR) 5 MG capsule, Take 5 mg by mouth 5 (five) times daily as needed., Disp: , Rfl:  .  Thyroid (LEVOTHYROXINE-LIOTHYRONINE) 30 MG TABS, Take 1 tablet by mouth daily. , Disp: , Rfl:  .  triamterene-hydrochlorothiazide (MAXZIDE) 75-50 MG tablet, Take 1 tablet by mouth daily., Disp: , Rfl:  .  warfarin (COUMADIN) 5 MG tablet, Take 5 mg by mouth daily. I tab 1 day a week All other days 1/2 tab, Disp: , Rfl:  .  Wheat Dextrin (BENEFIBER) POWD, Stir 2 tsp. TID into 4-8 oz of any non-carbonated beverage or soft food (hot or cold), Disp: 500 g, Rfl: 2 .  [START ON 05/06/2017] oxyCODONE (OXY IR/ROXICODONE) 5 MG immediate release tablet, Take 1 tablet (5 mg total) by  mouth 5 (five) times daily., Disp: 150 tablet, Rfl: 0 .  [START ON 04/06/2017] oxyCODONE (OXY IR/ROXICODONE) 5 MG immediate release tablet, Take 1 tablet (5 mg total) by mouth 5 (five) times daily., Disp: 150 tablet, Rfl: 0 .  oxyCODONE (OXY IR/ROXICODONE) 5 MG immediate release tablet, Take 1 tablet (5 mg total) by mouth 5 (five) times daily., Disp: 150 tablet, Rfl: 0  ROS  Constitutional: Denies any fever or chills Gastrointestinal: No reported hemesis, hematochezia, vomiting, or acute GI distress Musculoskeletal: Denies any acute onset joint swelling, redness, loss of ROM, or weakness Neurological: No reported episodes of acute onset apraxia, aphasia, dysarthria, agnosia, amnesia, paralysis, loss of coordination, or loss of consciousness  Allergies  [name] is allergic to contrast media [iodinated diagnostic agents].  PFSH  Drug: [name]  reports that she does not use  drugs. Alcohol:  reports that she does not drink alcohol. Tobacco:  reports that  has never smoked. she has never used smokeless tobacco. Medical:  has a past medical history of Anxiety, Aortic insufficiency, Arthritis, Asthma, Bronchitis, Bursitis, hip, Claustrophobia, Claustrophobia, Dysrhythmia, GERD (gastroesophageal reflux disease), Gout, Hyperlipidemia, Hypertension, Hypothyroidism, Mitral regurgitation, Obesity, Panic attacks, Paroxysmal atrial fibrillation ([place]), PTSD (post-traumatic stress disorder), Rhinitis, Sick sinus syndrome ([place]), and Sleep apnea. Surgical: [name]  has a past surgical history that includes Tonsillectomy; Pacemaker insertion; Status post child birth x1; Rotator cuff repair; Dilatation & curettage/hysteroscopy with myosure (N/A, 08/03/2015); Insert / replace / remove pacemaker; and Total hip arthroplasty (Left, 12/22/2015). Family: family history includes Alzheimer's disease in her father; Arthritis in her sister; Cancer in her brother and maternal grandfather; Heart disease in her mother;  Hypertension in her sister; Stroke in her mother.  Constitutional Exam  General appearance: Well nourished, well developed, and well hydrated. In no apparent acute distress Vitals:   03/07/17 0928  BP: (!) 146/67  Pulse: 72  Resp: 18  Temp: 97.8 F (36.6 C)  TempSrc: Oral  SpO2: 99%  Weight: 209 lb (94.8 kg)  Height: 5' 1"  (1.549 m)   BMI Assessment: Estimated body mass index is 39.49 kg/m as calculated from the following:   Height as of this encounter: 5' 1"  (1.549 m).   Weight as of this encounter: 209 lb (94.8 kg). Psych/Mental status: Alert, oriented x 3 (person, place, & time)       Eyes: PERLA Respiratory: No evidence of acute respiratory distress  Cervical Spine Area Exam  Skin & Axial Inspection: No masses, redness, edema, swelling, or associated skin lesions Alignment: Symmetrical Functional ROM: Unrestricted ROM      Stability: No instability detected Muscle Tone/Strength: Functionally intact. No obvious neuro-muscular anomalies detected. Sensory (Neurological): Unimpaired Palpation: No palpable anomalies              Upper Extremity (UE) Exam    Side: Right upper extremity  Side: Left upper extremity  Skin & Extremity Inspection: Skin color, temperature, and hair growth are WNL. No peripheral edema or cyanosis. No masses, redness, swelling, asymmetry, or associated skin lesions. No contractures.  Skin & Extremity Inspection: Skin color, temperature, and hair growth are WNL. No peripheral edema or cyanosis. No masses, redness, swelling, asymmetry, or associated skin lesions. No contractures.  Functional ROM: Unrestricted ROM          Functional ROM: Unrestricted ROM          Muscle Tone/Strength: Functionally intact. No obvious neuro-muscular anomalies detected.  Muscle Tone/Strength: Functionally intact. No obvious neuro-muscular anomalies detected.  Sensory (Neurological): Unimpaired          Sensory (Neurological): Unimpaired          Palpation: No palpable  anomalies              Palpation: No palpable anomalies              Specialized Test(s): Deferred         Specialized Test(s): Deferred          Thoracic Spine Area Exam  Skin & Axial Inspection: No masses, redness, or swelling Alignment: Symmetrical Functional ROM: Unrestricted ROM Stability: No instability detected Muscle Tone/Strength: Functionally intact. No obvious neuro-muscular anomalies detected. Sensory (Neurological): Unimpaired Muscle strength & Tone: No palpable anomalies  Lumbar Spine Area Exam  Skin & Axial Inspection: No masses, redness, or swelling Alignment: Symmetrical Functional ROM: Unrestricted ROM  Stability: No instability detected Muscle Tone/Strength: Functionally intact. No obvious neuro-muscular anomalies detected. Sensory (Neurological): Unimpaired Palpation: No palpable anomalies       Provocative Tests: Lumbar Hyperextension and rotation test: evaluation deferred today       Lumbar Lateral bending test: evaluation deferred today       Patrick's Maneuver: evaluation deferred today                    Gait & Posture Assessment  Ambulation: Unassisted Gait: Relatively normal for age and body habitus Posture: WNL   Lower Extremity Exam    Side: Right lower extremity  Side: Left lower extremity  Skin & Extremity Inspection: Skin color, temperature, and hair growth are WNL. No peripheral edema or cyanosis. No masses, redness, swelling, asymmetry, or associated skin lesions. No contractures.  Skin & Extremity Inspection:well healed incisional scar from previous surgery  Functional ROM: Unrestricted ROM          Functional ROM: Adequate ROM for hip joint  Muscle Tone/Strength: Functionally intact. No obvious neuro-muscular anomalies detected.  Muscle Tone/Strength: Functionally intact. No obvious neuro-muscular anomalies detected.  Sensory (Neurological): Unimpaired  Sensory (Neurological): Unimpaired  Palpation: No palpable anomalies  Palpation: No  palpable anomalies   Assessment  Primary Diagnosis & Pertinent Problem List: The primary encounter diagnosis was Chronic hip pain (Location of Primary Source of Pain) (Left). Diagnoses of Spinal stenosis of lumbar region with neurogenic claudication, Osteoarthritis of sacroiliac joint (with vacuum phenomena in) (Bilateral), Opioid-induced constipation (OIC), Chronic pain syndrome, Long term (current) use of opiate analgesic, Long term current use of opiate analgesic, and CKD (chronic kidney disease) stage 3, GFR 30-59 ml/min ([facility]) were also pertinent to this visit.  Status Diagnosis  Controlled Controlled Controlled 1. Chronic hip pain (Location of Primary Source of Pain) (Left)   2. Spinal stenosis of lumbar region with neurogenic claudication   3. Osteoarthritis of sacroiliac joint (with vacuum phenomena in) (Bilateral)   4. Opioid-induced constipation (OIC)   5. Chronic pain syndrome   6. Long term (current) use of opiate analgesic   7. Long term current use of opiate analgesic   8. CKD (chronic kidney disease) stage 3, GFR 30-59 ml/min ([facility])     Problems updated and reviewed during this visit: No problems updated. Plan of Care  Pharmacotherapy (Medications Ordered): Meds ordered this encounter  Medications  . oxyCODONE (OXY IR/ROXICODONE) 5 MG immediate release tablet    Sig: Take 1 tablet (5 mg total) by mouth 5 (five) times daily.    Dispense:  150 tablet    Refill:  0    Do not place this medication, or any other prescription from our practice, on "Automatic Refill". Patient may have prescription filled one day early if pharmacy is closed on scheduled refill date. Do not fill until: 05/06/2017 To last until:06/05/2017    Order Specific Question:   Supervising Provider    Answer:   Milinda Pointer [phone]  . oxyCODONE (OXY IR/ROXICODONE) 5 MG immediate release tablet    Sig: Take 1 tablet (5 mg total) by mouth 5 (five) times daily.    Dispense:  150 tablet    Refill:  0     Do not place this medication, or any other prescription from our practice, on "Automatic Refill". Patient may have prescription filled one day early if pharmacy is closed on scheduled refill date. Do not fill until: 04/06/2017 To last until:05/06/2017    Order Specific Question:   Supervising Provider  AnswerMilinda Pointer [[id number]]  . oxyCODONE (OXY IR/ROXICODONE) 5 MG immediate release tablet    Sig: Take 1 tablet (5 mg total) by mouth 5 (five) times daily.    Dispense:  150 tablet    Refill:  0    Do not place this medication, or any other prescription from our practice, on "Automatic Refill". Patient may have prescription filled one day early if pharmacy is closed on scheduled refill date. Do not fill until: 03/07/2017 To last until:04/06/2017    Order Specific Question:   Supervising Provider    Answer:   Milinda Pointer [phone]  This SmartLink is deprecated. Use AVSMEDLIST instead to display the medication list for a patient. Medications administered today: [name] had no medications administered during this visit. Lab-work, procedure(s), and/or referral(s): Orders Placed This Encounter  Procedures  . Comp. Metabolic Panel (12)  . Magnesium  . Vitamin B12  . Sedimentation rate  . 25-Hydroxyvitamin D Lcms D2+D3  . C-reactive protein   Imaging and/or referral(s): None  Interventional therapies: Planned, scheduled, and/or pending:   Not at this time.   Considering:   Not this time   Palliative PRN treatment(s):   Not at this time   Provider-requested follow-up: Return in about 3 months (around 06/05/2017).  Future Appointments  Date Time Provider [place]  03/12/2017 11:45 AM Evans Lance, MD [facility]  05/29/2017  9:45 AM Vevelyn Francois, NP [facility] None   Primary Care Physician: No primary care provider on file. Location: [facility] Note by: Vevelyn Francois NP Date: 03/07/2017; Time: 3:37  PM  Pain Score Disclaimer: We use the NRS-11 scale. This is a self-reported, subjective measurement of pain severity with only modest accuracy. It is used primarily to identify changes within a particular patient. It must be understood that outpatient pain scales are significantly less accurate that those used for research, where they can be applied under ideal controlled circumstances with minimal exposure to variables. In reality, the score is likely to be a combination of pain intensity and pain affect, where pain affect describes the degree of emotional arousal or changes in action readiness caused by the sensory experience of pain. Factors such as social and work situation, setting, emotional state, anxiety levels, expectation, and prior pain experience may influence pain perception and show Shaw inter-individual differences that may also be affected by time variables.  Patient instructions provided during this appointment: Patient Instructions    ____________________________________________________________________________________________  Medication Rules  Applies to: All patients receiving prescriptions (written or electronic).  Pharmacy of record: Pharmacy where electronic prescriptions will be sent. If written prescriptions are taken to a different pharmacy, please inform the nursing staff. The pharmacy listed in the electronic medical record should be the one where you would like electronic prescriptions to be sent.  Prescription refills: Only during scheduled appointments. Applies to both, written and electronic prescriptions.  NOTE: The following applies primarily to controlled substances (Opioid* Pain Medications).   Patient's responsibilities: 1. Pain Pills: Bring all pain pills to every appointment (except for procedure appointments). 2. Pill Bottles: Bring pills in original pharmacy bottle. Always bring newest bottle. Bring bottle, even if empty. 3. Medication refills: You  are responsible for knowing and keeping track of what medications you need refilled. The day before your appointment, write a list of all prescriptions that need to be refilled. Bring that list to your appointment and give it to the admitting nurse. Prescriptions will be written only  during appointments. If you forget a medication, it will not be "Called in", "Faxed", or "electronically sent". You will need to get another appointment to get these prescribed. 4. Prescription Accuracy: You are responsible for carefully inspecting your prescriptions before leaving our office. Have the discharge nurse carefully go over each prescription with you, before taking them home. Make sure that your name is accurately spelled, that your address is correct. Check the name and dose of your medication to make sure it is accurate. Check the number of pills, and the written instructions to make sure they are clear and accurate. Make sure that you are given enough medication to last until your next medication refill appointment. 5. Taking Medication: Take medication as prescribed. Never take more pills than instructed. Never take medication more frequently than prescribed. Taking less pills or less frequently is permitted and encouraged, when it comes to controlled substances (written prescriptions).  6. Inform other Doctors: Always inform, all of your healthcare providers, of all the medications you take. 7. Pain Medication from other Providers: You are not allowed to accept any additional pain medication from any other Doctor or Healthcare provider. There are two exceptions to this rule. (see below) In the event that you require additional pain medication, you are responsible for notifying us, as stated below. 8. Medication Agreement: You are responsible for carefully reading and following our Medication Agreement. This must be signed before receiving any prescriptions from our practice. Safely store a copy of your signed  Agreement. Violations to the Agreement will result in no further prescriptions. (Additional copies of our Medication Agreement are available upon request.) 9. Laws, Rules, & Regulations: All patients are expected to follow all Federal and [employer], [employer], Rules, [employer]. Ignorance of the Laws does not constitute a valid excuse. The use of any illegal substances is prohibited. 10. Adopted CDC guidelines & recommendations: Target dosing levels will be at or below 60 MME/day. Use of benzodiazepines** is not recommended.  Exceptions: There are only two exceptions to the rule of not receiving pain medications from other Healthcare Providers. 1. Exception #1 (Emergencies): In the event of an emergency (i.e.: accident requiring emergency care), you are allowed to receive additional pain medication. However, you are responsible for: As soon as you are able, call our office [phone], at any time of the day or night, and leave a message stating your name, the date and nature of the emergency, and the name and dose of the medication prescribed. In the event that your call is answered by a member of our staff, make sure to document and save the date, time, and the name of the person that took your information.  2. Exception #2 (Planned Surgery): In the event that you are scheduled by another doctor or dentist to have any type of surgery or procedure, you are allowed (for a period no longer than 30 days), to receive additional pain medication, for the acute post-op pain. However, in this case, you are responsible for picking up a copy of our "Post-op Pain Management for Surgeons" handout, and giving it to your surgeon or dentist. This document is available at our office, and does not require an appointment to obtain it. Simply go to our office during business hours (Monday-Thursday from 8:00 AM to 4:00 PM) (Friday 8:00 AM to 12:00 Noon) or if you have a scheduled appointment with [place], prior to your surgery,  and ask for it by name. In addition, you will need to provide [place]  with your name, name of your surgeon, type of surgery, and date of procedure or surgery.  *Opioid medications include: morphine, codeine, oxycodone, oxymorphone, hydrocodone, hydromorphone, meperidine, tramadol, tapentadol, buprenorphine, fentanyl, methadone. **Benzodiazepine medications include: diazepam (Valium), alprazolam (Xanax), clonazepam (Klonopine), lorazepam (Ativan), clorazepate (Tranxene), chlordiazepoxide (Librium), estazolam (Prosom), oxazepam (Serax), temazepam (Restoril), triazolam (Halcion)  ____________________________________________________________________________________________   BMI interpretation table: BMI level Category Range association with higher incidence of chronic pain  <18 kg/m2 Underweight   18.5-24.9 kg/m2 Ideal body weight   25-29.9 kg/m2 Overweight Increased incidence by 20%  30-34.9 kg/m2 Obese (Class I) Increased incidence by 68%  35-39.9 kg/m2 Severe obesity (Class II) Increased incidence by 136%  >40 kg/m2 Extreme obesity (Class III) Increased incidence by 254%   BMI Readings from Last 4 Encounters:  03/07/17 39.49 kg/m  12/05/16 39.49 kg/m  08/24/16 38.73 kg/m  08/18/16 40.25 kg/m   Wt Readings from Last 4 Encounters:  03/07/17 209 lb (94.8 kg)  12/05/16 209 lb (94.8 kg)  08/24/16 205 lb (93 kg)  08/18/16 213 lb (96.6 kg)

## 2017-03-07 NOTE — Patient Instructions (Addendum)
____________________________________________________________________________________________  Medication Rules  Applies to: All patients receiving prescriptions (written or electronic).  Pharmacy of record: Pharmacy where electronic prescriptions will be sent. If written prescriptions are taken to a different pharmacy, please inform the nursing staff. The pharmacy listed in the electronic medical record should be the one where you would like electronic prescriptions to be sent.  Prescription refills: Only during scheduled appointments. Applies to both, written and electronic prescriptions.  NOTE: The following applies primarily to controlled substances (Opioid* Pain Medications).   Patient's responsibilities: 1. Pain Pills: Bring all pain pills to every appointment (except for procedure appointments). 2. Pill Bottles: Bring pills in original pharmacy bottle. Always bring newest bottle. Bring bottle, even if empty. 3. Medication refills: You are responsible for knowing and keeping track of what medications you need refilled. The day before your appointment, write a list of all prescriptions that need to be refilled. Bring that list to your appointment and give it to the admitting nurse. Prescriptions will be written only during appointments. If you forget a medication, it will not be "Called in", "Faxed", or "electronically sent". You will need to get another appointment to get these prescribed. 4. Prescription Accuracy: You are responsible for carefully inspecting your prescriptions before leaving our office. Have the discharge nurse carefully go over each prescription with you, before taking them home. Make sure that your name is accurately spelled, that your address is correct. Check the name and dose of your medication to make sure it is accurate. Check the number of pills, and the written instructions to make sure they are clear and accurate. Make sure that you are given enough medication to  last until your next medication refill appointment. 5. Taking Medication: Take medication as prescribed. Never take more pills than instructed. Never take medication more frequently than prescribed. Taking less pills or less frequently is permitted and encouraged, when it comes to controlled substances (written prescriptions).  6. Inform other Doctors: Always inform, all of your healthcare providers, of all the medications you take. 7. Pain Medication from other Providers: You are not allowed to accept any additional pain medication from any other Doctor or Healthcare provider. There are two exceptions to this rule. (see below) In the event that you require additional pain medication, you are responsible for notifying us, as stated below. 8. Medication Agreement: You are responsible for carefully reading and following our Medication Agreement. This must be signed before receiving any prescriptions from our practice. Safely store a copy of your signed Agreement. Violations to the Agreement will result in no further prescriptions. (Additional copies of our Medication Agreement are available upon request.) 9. Laws, Rules, & Regulations: All patients are expected to follow all Federal and State Laws, Statutes, Rules, & Regulations. Ignorance of the Laws does not constitute a valid excuse. The use of any illegal substances is prohibited. 10. Adopted CDC guidelines & recommendations: Target dosing levels will be at or below 60 MME/day. Use of benzodiazepines** is not recommended.  Exceptions: There are only two exceptions to the rule of not receiving pain medications from other Healthcare Providers. 1. Exception #1 (Emergencies): In the event of an emergency (i.e.: accident requiring emergency care), you are allowed to receive additional pain medication. However, you are responsible for: As soon as you are able, call our office [phone], at any time of the day or night, and leave a message stating your  name, the date and nature of the emergency, and the name and dose of the medication   prescribed. In the event that your call is answered by a member of our staff, make sure to document and save the date, time, and the name of the person that took your information.  2. Exception #2 (Planned Surgery): In the event that you are scheduled by another doctor or dentist to have any type of surgery or procedure, you are allowed (for a period no longer than 30 days), to receive additional pain medication, for the acute post-op pain. However, in this case, you are responsible for picking up a copy of our "Post-op Pain Management for Surgeons" handout, and giving it to your surgeon or dentist. This document is available at our office, and does not require an appointment to obtain it. Simply go to our office during business hours (Monday-Thursday from 8:00 AM to 4:00 PM) (Friday 8:00 AM to 12:00 Noon) or if you have a scheduled appointment with [place], prior to your surgery, and ask for it by name. In addition, you will need to provide [place] with your name, name of your surgeon, type of surgery, and date of procedure or surgery.  *Opioid medications include: morphine, codeine, oxycodone, oxymorphone, hydrocodone, hydromorphone, meperidine, tramadol, tapentadol, buprenorphine, fentanyl, methadone. **Benzodiazepine medications include: diazepam (Valium), alprazolam (Xanax), clonazepam (Klonopine), lorazepam (Ativan), clorazepate (Tranxene), chlordiazepoxide (Librium), estazolam (Prosom), oxazepam (Serax), temazepam (Restoril), triazolam (Halcion)  ____________________________________________________________________________________________   BMI interpretation table: BMI level Category Range association with higher incidence of chronic pain  <18 kg/m2 Underweight   18.5-24.9 kg/m2 Ideal body weight   25-29.9 kg/m2 Overweight Increased incidence by 20%  30-34.9 kg/m2 Obese (Class I) Increased incidence by 68%  35-39.9 kg/m2  Severe obesity (Class II) Increased incidence by 136%  >40 kg/m2 Extreme obesity (Class III) Increased incidence by 254%   BMI Readings from Last 4 Encounters:  03/07/17 39.49 kg/m  12/05/16 39.49 kg/m  08/24/16 38.73 kg/m  08/18/16 40.25 kg/m   Wt Readings from Last 4 Encounters:  03/07/17 209 lb (94.8 kg)  12/05/16 209 lb (94.8 kg)  08/24/16 205 lb (93 kg)  08/18/16 213 lb (96.6 kg)

## 2017-03-08 ENCOUNTER — Encounter: Payer: Medicare Other | Admitting: Internal Medicine

## 2017-03-12 ENCOUNTER — Encounter: Payer: Self-pay | Admitting: Internal Medicine

## 2017-03-12 ENCOUNTER — Ambulatory Visit (INDEPENDENT_AMBULATORY_CARE_PROVIDER_SITE_OTHER): Payer: Medicare Other | Admitting: Internal Medicine

## 2017-03-12 VITALS — BP 110/72 | HR 68 | Ht 61.0 in | Wt 217.0 lb

## 2017-03-12 DIAGNOSIS — I482 Chronic atrial fibrillation, unspecified: Secondary | ICD-10-CM

## 2017-03-12 DIAGNOSIS — Z950 Presence of cardiac pacemaker: Secondary | ICD-10-CM

## 2017-03-12 DIAGNOSIS — I10 Essential (primary) hypertension: Secondary | ICD-10-CM | POA: Diagnosis not present

## 2017-03-12 LAB — COMP. METABOLIC PANEL (12)
ALK PHOS: 68 IU/L (ref 39–117)
AST: 24 IU/L (ref 0–40)
Albumin/Globulin Ratio: 1.3 (ref 1.2–2.2)
Albumin: 4.4 g/dL (ref 3.5–4.8)
BILIRUBIN TOTAL: 0.3 mg/dL (ref 0.0–1.2)
BUN / CREAT RATIO: 25 (ref 12–28)
BUN: 32 mg/dL — AB (ref 8–27)
CHLORIDE: 97 mmol/L (ref 96–106)
Calcium: 10.2 mg/dL (ref 8.7–10.3)
Creatinine, Ser: 1.3 mg/dL — ABNORMAL HIGH (ref 0.57–1.00)
GFR calc Af Amer: 46 mL/min/{1.73_m2} — ABNORMAL LOW (ref >59)
GFR calc non Af Amer: 40 mL/min/{1.73_m2} — ABNORMAL LOW (ref >59)
Globulin, Total: 3.3 g/dL (ref 1.5–4.5)
Glucose: 91 mg/dL (ref 65–99)
Potassium: 4 mmol/L (ref 3.5–5.2)
Sodium: 140 mmol/L (ref 134–144)
TOTAL PROTEIN: 7.7 g/dL (ref 6.0–8.5)

## 2017-03-12 LAB — CUP PACEART INCLINIC DEVICE CHECK
Battery Voltage: 2.76 V
Implantable Lead Implant Date: 20090317
Implantable Lead Implant Date: 20090317
Implantable Lead Location: 753859
Implantable Pulse Generator Implant Date: 20090317
Lead Channel Pacing Threshold Amplitude: 0.5 V
Lead Channel Sensing Intrinsic Amplitude: 12 mV
Lead Channel Setting Pacing Pulse Width: 0.4 ms
Lead Channel Setting Sensing Sensitivity: 2 mV
MDC IDC LEAD LOCATION: 753860
MDC IDC MSMT BATTERY IMPEDANCE: 4700 Ohm
MDC IDC MSMT LEADCHNL RV IMPEDANCE VALUE: 530 Ohm
MDC IDC MSMT LEADCHNL RV PACING THRESHOLD PULSEWIDTH: 0.4 ms
MDC IDC PG SERIAL: 2056081
MDC IDC SESS DTM: 20190114132802
MDC IDC SET LEADCHNL RV PACING AMPLITUDE: 2.5 V

## 2017-03-12 LAB — 25-HYDROXY VITAMIN D LCMS D2+D3
25-Hydroxy, Vitamin D-2: 5.6 ng/mL
25-Hydroxy, Vitamin D-3: 33 ng/mL
25-Hydroxy, Vitamin D: 39 ng/mL

## 2017-03-12 LAB — C-REACTIVE PROTEIN: CRP: 1.6 mg/L (ref 0.0–4.9)

## 2017-03-12 LAB — SEDIMENTATION RATE: SED RATE: 37 mm/h (ref 0–40)

## 2017-03-12 LAB — MAGNESIUM: MAGNESIUM: 2.3 mg/dL (ref 1.6–2.3)

## 2017-03-12 LAB — VITAMIN B12: VITAMIN B 12: 661 pg/mL (ref 232–1245)

## 2017-03-12 NOTE — Patient Instructions (Signed)

## 2017-03-12 NOTE — Progress Notes (Signed)
HPI [name] returns today for followup. She is a very pleasant 77 year old woman with a history of hypertension, symptomatic bradycardia, chronic atrial fibrillation, status post permanent pacemaker insertion. She remains morbidly obese. The patient notes occasional mild peripheral edema. She denies chest pain or shortness of breath. No recent falls. She does have arthritis. She has minimal palpitations. In the interim, she has not been in the hospital. Allergies  Allergen Reactions  . Contrast Media [Iodinated Diagnostic Agents] Rash    Contrast dye causes swelling     Current Outpatient Medications  Medication Sig Dispense Refill  . colchicine 0.6 MG tablet Take 0.6 mg by mouth 3 (three) times a week.     . diazepam (VALIUM) 10 MG tablet Take 10 mg by mouth 2 (two) times daily as needed for anxiety.    . metoprolol tartrate (LOPRESSOR) 50 MG tablet Take 1 tablet (50 mg total) by mouth 2 (two) times daily. 180 tablet 3  . oxyCODONE (OXY IR/ROXICODONE) 5 MG immediate release tablet Take 1 tablet (5 mg total) by mouth 5 (five) times daily. 150 tablet 0  . Thyroid (LEVOTHYROXINE-LIOTHYRONINE) 30 MG TABS Take 1 tablet by mouth daily.     Marland Kitchen triamterene-hydrochlorothiazide (MAXZIDE) 75-50 MG tablet Take 1 tablet by mouth daily.    Marland Kitchen warfarin (COUMADIN) 5 MG tablet Take 5 mg by mouth daily. I tab 1 day a week All other days 1/2 tab     No current facility-administered medications for this visit.      Past Medical History:  Diagnosis Date  . Anxiety   . Aortic insufficiency    Trivial  . Arthritis   . Asthma   . Bronchitis   . Bursitis, hip   . Claustrophobia   . Claustrophobia   . Dysrhythmia    a fib  . GERD (gastroesophageal reflux disease)    history of  . Gout   . Hyperlipidemia   . Hypertension   . Hypothyroidism   . Mitral regurgitation   . Obesity   . Panic attacks   . Paroxysmal atrial fibrillation (HCC)   . PTSD (post-traumatic stress disorder)   .  Rhinitis   . Sick sinus syndrome ([place])   . Sleep apnea    was ordered CPAP, but never fitted for one years ago, Dr has retired    ROS:   All systems reviewed and negative except as noted in the HPI.   Past Surgical History:  Procedure Laterality Date  . DILATATION & CURETTAGE/HYSTEROSCOPY WITH MYOSURE N/A 08/03/2015   Procedure: DILATATION & CURETTAGE/HYSTEROSCOPY WITH MYOSURE;  Surgeon: Alden Hipp, MD;  Location: [facility];  Service: Gynecology;  Laterality: N/A;  Patient has an ICD   . INSERT / REPLACE / REMOVE PACEMAKER    . PACEMAKER INSERTION     Status post insertion  . ROTATOR CUFF REPAIR     left  . Status post child birth x1    . TONSILLECTOMY    . TOTAL HIP ARTHROPLASTY Left 12/22/2015   Procedure: TOTAL HIP ARTHROPLASTY;  Surgeon: Dereck Leep, MD;  Location: [facility];  Service: Orthopedics;  Laterality: Left;     Family History  Problem Relation Age of Onset  . Stroke Mother   . Heart disease Mother   . Alzheimer's disease Father   . Hypertension Sister   . Cancer Brother   . Arthritis Sister   . Cancer Maternal Grandfather      Social History   Socioeconomic History  .  Marital status: Married    Spouse name: Not on file  . Number of children: 1  . Years of education: Not on file  . Highest education level: Not on file  Social Needs  . Financial resource strain: Not on file  . Food insecurity - worry: Not on file  . Food insecurity - inability: Not on file  . Transportation needs - medical: Not on file  . Transportation needs - non-medical: Not on file  Occupational History  . 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 Smoking status: Never Smoker  . Smokeless tobacco: Never Used  Substance and Sexual Activity  . Alcohol use: No    Alcohol/week: 0.0 oz  . Drug use: No  . Sexual activity: No    Birth control/protection: Abstinence  Other Topics Concern  . Not on file  Social History Narrative  . Not on file      BP 110/72   Pulse 68   Ht 5\' 1"  (1.549 m)   Wt 217 lb (98.4 kg)   BMI 41.00 kg/m   Physical Exam:  Well appearing 77 yo woman, NAD HEENT: Unremarkable Neck:  6 cm JVD, no thyromegally Lymphatics:  No adenopathy Back:  No CVA tenderness Lungs:  Clear with no wheezes HEART:  Regular rate rhythm, no murmurs, no rubs, no clicks Abd:  soft, positive bowel sounds, no organomegally, no rebound, no guarding Ext:  2 plus pulses, no edema, no cyanosis, no clubbing Skin:  No rashes no nodules Neuro:  CN II through XII intact, motor grossly intact  DEVICE  Normal device function.  See PaceArt for details.   Assess/Plan: 1. Atrial fib - her rates are well controlled and sheis asymptomatic. 2. CHB - she has an escape today. She is asymptomatic. 3. PPM - her [employer] Jude PM is working normally with about 3 years of battery longevity.  Mikle Bosworth.D.

## 2017-03-19 ENCOUNTER — Telehealth: Payer: Self-pay | Admitting: Internal Medicine

## 2017-03-19 DIAGNOSIS — I482 Chronic atrial fibrillation, unspecified: Secondary | ICD-10-CM

## 2017-03-19 MED ORDER — METOPROLOL TARTRATE 50 MG PO TABS: 50.0000 mg | ORAL_TABLET | Freq: Two times a day (BID) | ORAL | 1 refills | Status: DC

## 2017-03-19 NOTE — Telephone Encounter (Signed)
Pt's medication was sent to pt's pharmacy as requested. Confirmation received.  °

## 2017-03-19 NOTE — Telephone Encounter (Signed)
New message    Patient states she dropped medication in the sink. Send to local pharmacy   *STAT* If patient is at the pharmacy, call can be transferred to refill team.   1. Which medications need to be refilled? (please list name of each medication and dose if known) metoprolol tartrate (LOPRESSOR) 50 MG tablet  2. Which pharmacy/location (including street and city if local pharmacy) is medication to be sent to? [facility] at [address] 3. Do they need a 30 day or 90 day supply? [place]

## 2017-03-20 DIAGNOSIS — Z7901 Long term (current) use of anticoagulants: Secondary | ICD-10-CM | POA: Diagnosis not present

## 2017-03-20 DIAGNOSIS — I482 Chronic atrial fibrillation: Secondary | ICD-10-CM | POA: Diagnosis not present

## 2017-04-10 DIAGNOSIS — Z7901 Long term (current) use of anticoagulants: Secondary | ICD-10-CM | POA: Diagnosis not present

## 2017-04-10 DIAGNOSIS — I482 Chronic atrial fibrillation: Secondary | ICD-10-CM | POA: Diagnosis not present

## 2017-04-11 DIAGNOSIS — E039 Hypothyroidism, unspecified: Secondary | ICD-10-CM | POA: Diagnosis not present

## 2017-04-11 DIAGNOSIS — F419 Anxiety disorder, unspecified: Secondary | ICD-10-CM | POA: Diagnosis not present

## 2017-04-11 DIAGNOSIS — N951 Menopausal and female climacteric states: Secondary | ICD-10-CM | POA: Diagnosis not present

## 2017-04-13 DIAGNOSIS — E569 Vitamin deficiency, unspecified: Secondary | ICD-10-CM | POA: Diagnosis not present

## 2017-04-13 DIAGNOSIS — D510 Vitamin B12 deficiency anemia due to intrinsic factor deficiency: Secondary | ICD-10-CM | POA: Diagnosis not present

## 2017-04-13 DIAGNOSIS — Z79899 Other long term (current) drug therapy: Secondary | ICD-10-CM | POA: Diagnosis not present

## 2017-04-13 DIAGNOSIS — R5382 Chronic fatigue, unspecified: Secondary | ICD-10-CM | POA: Diagnosis not present

## 2017-04-13 DIAGNOSIS — R799 Abnormal finding of blood chemistry, unspecified: Secondary | ICD-10-CM | POA: Diagnosis not present

## 2017-04-13 DIAGNOSIS — Z139 Encounter for screening, unspecified: Secondary | ICD-10-CM | POA: Diagnosis not present

## 2017-04-13 DIAGNOSIS — E559 Vitamin D deficiency, unspecified: Secondary | ICD-10-CM | POA: Diagnosis not present

## 2017-04-13 DIAGNOSIS — E039 Hypothyroidism, unspecified: Secondary | ICD-10-CM | POA: Diagnosis not present

## 2017-04-13 DIAGNOSIS — I7090 Unspecified atherosclerosis: Secondary | ICD-10-CM | POA: Diagnosis not present

## 2017-04-13 DIAGNOSIS — Z780 Asymptomatic menopausal state: Secondary | ICD-10-CM | POA: Diagnosis not present

## 2017-04-19 ENCOUNTER — Ambulatory Visit (INDEPENDENT_AMBULATORY_CARE_PROVIDER_SITE_OTHER): Payer: Medicare Other | Admitting: *Deleted

## 2017-04-19 DIAGNOSIS — I482 Chronic atrial fibrillation, unspecified: Secondary | ICD-10-CM

## 2017-04-19 DIAGNOSIS — Z5181 Encounter for therapeutic drug level monitoring: Secondary | ICD-10-CM | POA: Insufficient documentation

## 2017-04-19 LAB — POCT INR: INR: 2.4

## 2017-04-19 MED ORDER — WARFARIN SODIUM 2.5 MG PO TABS: 2.5000 mg | ORAL_TABLET | Freq: Every day | ORAL | 0 refills | Status: DC

## 2017-04-19 NOTE — Patient Instructions (Addendum)
A full discussion of the nature of anticoagulants has been carried out.  A benefit risk analysis has been presented to the patient, so that they understand the justification for choosing anticoagulation at this time. The need for frequent and regular monitoring, precise dosage adjustment and compliance is stressed.  Side effects of potential bleeding are discussed.  The patient should avoid any OTC items containing aspirin or ibuprofen, and should avoid great swings in general diet.  Avoid alcohol consumption.  Call if any signs of abnormal bleeding.  Description   Continue current dose of 2.5 mg daily except for 5 mg on Saturday. Please call the coumadin clinic if any med changes occur. [facility] [phone] Main [phone]

## 2017-04-24 DIAGNOSIS — E039 Hypothyroidism, unspecified: Secondary | ICD-10-CM | POA: Diagnosis not present

## 2017-04-24 DIAGNOSIS — F419 Anxiety disorder, unspecified: Secondary | ICD-10-CM | POA: Diagnosis not present

## 2017-04-24 DIAGNOSIS — N951 Menopausal and female climacteric states: Secondary | ICD-10-CM | POA: Diagnosis not present

## 2017-05-04 ENCOUNTER — Ambulatory Visit (INDEPENDENT_AMBULATORY_CARE_PROVIDER_SITE_OTHER): Payer: Medicare Other | Admitting: *Deleted

## 2017-05-04 DIAGNOSIS — I482 Chronic atrial fibrillation, unspecified: Secondary | ICD-10-CM

## 2017-05-04 DIAGNOSIS — Z5181 Encounter for therapeutic drug level monitoring: Secondary | ICD-10-CM

## 2017-05-04 LAB — POCT INR: INR: 2.2

## 2017-05-04 NOTE — Patient Instructions (Signed)
Description   Continue taking 2.5 mg daily except for 5 mg on Saturday. Please call the coumadin clinic if any med changes or concerns. Recheck in 3 weeks. [facility] [phone] Main [phone]

## 2017-05-24 ENCOUNTER — Ambulatory Visit (INDEPENDENT_AMBULATORY_CARE_PROVIDER_SITE_OTHER): Payer: Medicare Other | Admitting: *Deleted

## 2017-05-24 DIAGNOSIS — Z5181 Encounter for therapeutic drug level monitoring: Secondary | ICD-10-CM

## 2017-05-24 DIAGNOSIS — I482 Chronic atrial fibrillation, unspecified: Secondary | ICD-10-CM

## 2017-05-24 LAB — POCT INR: INR: 1.8

## 2017-05-24 NOTE — Patient Instructions (Signed)
Description   Today take an extra 2.5mg , then Continue taking 2.5 mg daily except for 5 mg on Saturday. Please call the coumadin clinic if any med changes or concerns. Recheck in 2 weeks. [facility] [phone] Main [phone]

## 2017-05-29 ENCOUNTER — Other Ambulatory Visit: Payer: Self-pay

## 2017-05-29 ENCOUNTER — Ambulatory Visit: Payer: Medicare Other | Attending: Nurse Practitioner | Admitting: Nurse Practitioner

## 2017-05-29 VITALS — BP 132/66 | HR 67 | Temp 97.6°F | Resp 16 | Ht 61.0 in | Wt 211.0 lb

## 2017-05-29 DIAGNOSIS — E039 Hypothyroidism, unspecified: Secondary | ICD-10-CM | POA: Diagnosis not present

## 2017-05-29 DIAGNOSIS — M533 Sacrococcygeal disorders, not elsewhere classified: Secondary | ICD-10-CM | POA: Diagnosis not present

## 2017-05-29 DIAGNOSIS — Z96642 Presence of left artificial hip joint: Secondary | ICD-10-CM | POA: Insufficient documentation

## 2017-05-29 DIAGNOSIS — M879 Osteonecrosis, unspecified: Secondary | ICD-10-CM | POA: Insufficient documentation

## 2017-05-29 DIAGNOSIS — M109 Gout, unspecified: Secondary | ICD-10-CM | POA: Diagnosis not present

## 2017-05-29 DIAGNOSIS — K5903 Drug induced constipation: Secondary | ICD-10-CM | POA: Insufficient documentation

## 2017-05-29 DIAGNOSIS — M79605 Pain in left leg: Secondary | ICD-10-CM | POA: Diagnosis present

## 2017-05-29 DIAGNOSIS — J45909 Unspecified asthma, uncomplicated: Secondary | ICD-10-CM | POA: Insufficient documentation

## 2017-05-29 DIAGNOSIS — Z950 Presence of cardiac pacemaker: Secondary | ICD-10-CM | POA: Insufficient documentation

## 2017-05-29 DIAGNOSIS — I129 Hypertensive chronic kidney disease with stage 1 through stage 4 chronic kidney disease, or unspecified chronic kidney disease: Secondary | ICD-10-CM | POA: Insufficient documentation

## 2017-05-29 DIAGNOSIS — G8929 Other chronic pain: Secondary | ICD-10-CM | POA: Diagnosis not present

## 2017-05-29 DIAGNOSIS — G894 Chronic pain syndrome: Secondary | ICD-10-CM

## 2017-05-29 DIAGNOSIS — M47816 Spondylosis without myelopathy or radiculopathy, lumbar region: Secondary | ICD-10-CM

## 2017-05-29 DIAGNOSIS — F4310 Post-traumatic stress disorder, unspecified: Secondary | ICD-10-CM | POA: Insufficient documentation

## 2017-05-29 DIAGNOSIS — N183 Chronic kidney disease, stage 3 (moderate): Secondary | ICD-10-CM | POA: Insufficient documentation

## 2017-05-29 DIAGNOSIS — E785 Hyperlipidemia, unspecified: Secondary | ICD-10-CM | POA: Insufficient documentation

## 2017-05-29 DIAGNOSIS — Z7901 Long term (current) use of anticoagulants: Secondary | ICD-10-CM | POA: Diagnosis not present

## 2017-05-29 DIAGNOSIS — F419 Anxiety disorder, unspecified: Secondary | ICD-10-CM | POA: Insufficient documentation

## 2017-05-29 DIAGNOSIS — I495 Sick sinus syndrome: Secondary | ICD-10-CM | POA: Diagnosis not present

## 2017-05-29 DIAGNOSIS — M25552 Pain in left hip: Secondary | ICD-10-CM | POA: Diagnosis not present

## 2017-05-29 DIAGNOSIS — M48062 Spinal stenosis, lumbar region with neurogenic claudication: Secondary | ICD-10-CM | POA: Diagnosis not present

## 2017-05-29 DIAGNOSIS — I480 Paroxysmal atrial fibrillation: Secondary | ICD-10-CM | POA: Insufficient documentation

## 2017-05-29 DIAGNOSIS — Z6841 Body Mass Index (BMI) 40.0 and over, adult: Secondary | ICD-10-CM | POA: Diagnosis not present

## 2017-05-29 DIAGNOSIS — Z79891 Long term (current) use of opiate analgesic: Secondary | ICD-10-CM | POA: Diagnosis not present

## 2017-05-29 DIAGNOSIS — Z79899 Other long term (current) drug therapy: Secondary | ICD-10-CM | POA: Insufficient documentation

## 2017-05-29 DIAGNOSIS — K219 Gastro-esophageal reflux disease without esophagitis: Secondary | ICD-10-CM | POA: Diagnosis not present

## 2017-05-29 DIAGNOSIS — M1612 Unilateral primary osteoarthritis, left hip: Secondary | ICD-10-CM | POA: Diagnosis not present

## 2017-05-29 DIAGNOSIS — M7062 Trochanteric bursitis, left hip: Secondary | ICD-10-CM | POA: Insufficient documentation

## 2017-05-29 DIAGNOSIS — G4730 Sleep apnea, unspecified: Secondary | ICD-10-CM | POA: Insufficient documentation

## 2017-05-29 MED ORDER — OXYCODONE HCL 5 MG PO TABS: 5.0000 mg | ORAL_TABLET | Freq: Every day | ORAL | 0 refills | Status: DC | PRN

## 2017-05-29 NOTE — Patient Instructions (Addendum)
____________________________________________________________________________________________  Medication Rules  Applies to: All patients receiving prescriptions (written or electronic).  Pharmacy of record: Pharmacy where electronic prescriptions will be sent. If written prescriptions are taken to a different pharmacy, please inform the nursing staff. The pharmacy listed in the electronic medical record should be the one where you would like electronic prescriptions to be sent.  Prescription refills: Only during scheduled appointments. Applies to both, written and electronic prescriptions.  NOTE: The following applies primarily to controlled substances (Opioid* Pain Medications).   Patient's responsibilities: 1. Pain Pills: Bring all pain pills to every appointment (except for procedure appointments). 2. Pill Bottles: Bring pills in original pharmacy bottle. Always bring newest bottle. Bring bottle, even if empty. 3. Medication refills: You are responsible for knowing and keeping track of what medications you need refilled. The day before your appointment, write a list of all prescriptions that need to be refilled. Bring that list to your appointment and give it to the admitting nurse. Prescriptions will be written only during appointments. If you forget a medication, it will not be "Called in", "Faxed", or "electronically sent". You will need to get another appointment to get these prescribed. 4. Prescription Accuracy: You are responsible for carefully inspecting your prescriptions before leaving our office. Have the discharge nurse carefully go over each prescription with you, before taking them home. Make sure that your name is accurately spelled, that your address is correct. Check the name and dose of your medication to make sure it is accurate. Check the number of pills, and the written instructions to make sure they are clear and accurate. Make sure that you are given enough medication to last  until your next medication refill appointment. 5. Taking Medication: Take medication as prescribed. Never take more pills than instructed. Never take medication more frequently than prescribed. Taking less pills or less frequently is permitted and encouraged, when it comes to controlled substances (written prescriptions).  6. Inform other Doctors: Always inform, all of your healthcare providers, of all the medications you take. 7. Pain Medication from other Providers: You are not allowed to accept any additional pain medication from any other Doctor or Healthcare provider. There are two exceptions to this rule. (see below) In the event that you require additional pain medication, you are responsible for notifying us, as stated below. 8. Medication Agreement: You are responsible for carefully reading and following our Medication Agreement. This must be signed before receiving any prescriptions from our practice. Safely store a copy of your signed Agreement. Violations to the Agreement will result in no further prescriptions. (Additional copies of our Medication Agreement are available upon request.) 9. Laws, Rules, & Regulations: All patients are expected to follow all Federal and State Laws, Statutes, Rules, & Regulations. Ignorance of the Laws does not constitute a valid excuse. The use of any illegal substances is prohibited. 10. Adopted CDC guidelines & recommendations: Target dosing levels will be at or below 60 MME/day. Use of benzodiazepines** is not recommended.  Exceptions: There are only two exceptions to the rule of not receiving pain medications from other Healthcare Providers. 1. Exception #1 (Emergencies): In the event of an emergency (i.e.: accident requiring emergency care), you are allowed to receive additional pain medication. However, you are responsible for: As soon as you are able, call our office [phone], at any time of the day or night, and leave a message stating your name, the  date and nature of the emergency, and the name and dose of the medication   prescribed. In the event that your call is answered by a member of our staff, make sure to document and save the date, time, and the name of the person that took your information.  2. Exception #2 (Planned Surgery): In the event that you are scheduled by another doctor or dentist to have any type of surgery or procedure, you are allowed (for a period no longer than 30 days), to receive additional pain medication, for the acute post-op pain. However, in this case, you are responsible for picking up a copy of our "Post-op Pain Management for Surgeons" handout, and giving it to your surgeon or dentist. This document is available at our office, and does not require an appointment to obtain it. Simply go to our office during business hours (Monday-Thursday from 8:00 AM to 4:00 PM) (Friday 8:00 AM to 12:00 Noon) or if you have a scheduled appointment with [place], prior to your surgery, and ask for it by name. In addition, you will need to provide [place] with your name, name of your surgeon, type of surgery, and date of procedure or surgery.  *Opioid medications include: morphine, codeine, oxycodone, oxymorphone, hydrocodone, hydromorphone, meperidine, tramadol, tapentadol, buprenorphine, fentanyl, methadone. **Benzodiazepine medications include: diazepam (Valium), alprazolam (Xanax), clonazepam (Klonopine), lorazepam (Ativan), clorazepate (Tranxene), chlordiazepoxide (Librium), estazolam (Prosom), oxazepam (Serax), temazepam (Restoril), triazolam (Halcion) (Last updated: 04/26/2017) ____________________________________________________________________________________________   BMI Assessment: Estimated body mass index is 39.87 kg/m as calculated from the following:   Height as of this encounter: 5\' 1"  (1.549 m).   Weight as of this encounter: 211 lb (95.7 kg).  BMI interpretation table: BMI level Category Range association with higher  incidence of chronic pain  <18 kg/m2 Underweight   18.5-24.9 kg/m2 Ideal body weight   25-29.9 kg/m2 Overweight Increased incidence by 20%  30-34.9 kg/m2 Obese (Class I) Increased incidence by 68%  35-39.9 kg/m2 Severe obesity (Class II) Increased incidence by 136%  >40 kg/m2 Extreme obesity (Class III) Increased incidence by 254%   BMI Readings from Last 4 Encounters:  05/29/17 39.87 kg/m  03/12/17 41.00 kg/m  03/07/17 39.49 kg/m  12/05/16 39.49 kg/m   Wt Readings from Last 4 Encounters:  05/29/17 211 lb (95.7 kg)  03/12/17 217 lb (98.4 kg)  03/07/17 209 lb (94.8 kg)  12/05/16 209 lb (94.8 kg)

## 2017-05-29 NOTE — Progress Notes (Signed)
Patient's Name: [name]  MRN: [id number]  Referring Provider: No ref. provider found  DOB: 05/18/1940  PCP: Patient, No Pcp Per  DOS: 05/29/2017  Note by: Vevelyn Francois NP  Service setting: Ambulatory outpatient  Specialty: Interventional Pain Management  Location: [facility] (AMB) Pain Management Facility    Patient type: Established    Primary Reason(s) for Visit: Encounter for prescription drug management. (Level of risk: moderate)  CC: Leg Pain (left, upper)  HPI  [name] is a 77 y.o. year old, female patient, who comes today for a medication management evaluation. She has HYPOTHYROIDISM; HYPERLIPIDEMIA; OBESITY; Essential hypertension; SICK SINUS SYNDROME; Cardiac pacemaker in situ; Cough; GERD (gastroesophageal reflux disease); Atrial fibrillation ([place]); Chronic hip pain (Location of Primary Source of Pain) (Left); Long term current use of opiate analgesic; Long term prescription opiate use; Opiate use (54 MME/Day); Encounter for therapeutic drug level monitoring; Disturbance of skin sensation; Osteoarthritis of hip (Left); Trochanteric bursitis of hip (Left); Lumbar central spinal stenosis (L4-5); Lumbar facet arthropathy ([place]); Osteoarthritis of sacroiliac joint (with vacuum phenomena in) (Bilateral); Avascular necrosis of left femoral head ([place]); Opioid-induced constipation ([facility]); Hypothyroidism (acquired); Long term (current) use of opiate analgesic; Morbid obesity with BMI of 40.0-44.9, adult ([place]); PTSD (post-traumatic stress disorder); Spinal stenosis of lumbar region; S/P total hip arthroplasty; Chronic pain syndrome; Status post total replacement of left hip; CKD (chronic kidney disease) stage 3, GFR 30-59 ml/min ([place]); Anemia of chronic disease; Hypocalcemia; Disorder of bone, unspecified; Other specified health status; Encounter for therapeutic drug monitoring; and Sick sinus syndrome ([place]) on their problem list. Her primarily concern today is the Leg Pain (left, upper)  Pain  Assessment: Location: Left, Upper Leg Radiating: denies Onset: More than a month ago Duration: Chronic pain Quality: Aching, Throbbing Severity: 4 /10 (self-reported pain score)  Note: Reported level is compatible with observation.                          Timing: Intermittent Modifying factors: medications, walking  [name] was last scheduled for an appointment on 03/07/2017 for medication management. During today's appointment we reviewed [name]'s chronic pain status, as well as her outpatient medication regimen. She admits that she has daily pain. She knows that it is arthritis. She uses her pain medication as directed. She admits that the weather causes her pain to be so much worse. She admits that she in not able to lay on her left side at night secondary to pain. She admits that is right at the incision site.   The patient  reports that she does not use drugs. Her body mass index is 39.87 kg/m.  Further details on both, my assessment(s), as well as the proposed treatment plan, please see below.  Controlled Substance Pharmacotherapy Assessment REMS (Risk Evaluation and Mitigation Strategy)  Analgesic:Oxycodone IR 5 mg 1 tablet by mouth 5 times a day (25 mg/day) MME/day:37.20m/day  .  WLandis Martins RN  05/29/2017  9:11 AM  Sign at close encounter Nursing Pain Medication Assessment:  Safety precautions to be maintained throughout the outpatient stay will include: orient to surroundings, keep bed in low position, maintain call bell within reach at all times, provide assistance with transfer out of bed and ambulation.  Medication Inspection Compliance: Pill count conducted under aseptic conditions, in front of the patient. Neither the pills nor the bottle was removed from the patient's sight at any time. Once count was completed pills were immediately returned to the  patient in their original bottle.  Medication: Oxycodone IR Pill/Patch Count: 40 of 150 pills remain Pill/Patch  Appearance: Markings consistent with prescribed medication Bottle Appearance: Standard pharmacy container. Clearly labeled. Filled Date:03/10/ 2019 Last Medication intake:  Yesterday   Pharmacokinetics: Liberation and absorption (onset of action): WNL Distribution (time to peak effect): WNL Metabolism and excretion (duration of action): WNL         Pharmacodynamics: Desired effects: Analgesia: [name] reports >50% benefit. Functional ability: Patient reports that medication allows her to accomplish basic ADLs Clinically meaningful improvement in function (CMIF): Sustained CMIF goals met Perceived effectiveness: Described as relatively effective, allowing for increase in activities of daily living (ADL) Undesirable effects: Side-effects or Adverse reactions: None reported Monitoring: [place] PMP: Online review of the past 14-monthperiod conducted. Compliant with practice rules and regulations Last UDS on record: Summary  Date Value Ref Range Status  12/05/2016 FINAL  Final    Comment:    ==================================================================== TOXASSURE SELECT 13 (MW) ==================================================================== Test                             Result       Flag       Units Drug Present and Declared for Prescription Verification   Oxazepam                       30           EXPECTED   ng/mg creat   Temazepam                      17           EXPECTED   ng/mg creat    Oxazepam and temazepam are expected metabolites of diazepam.    Oxazepam is also an expected metabolite of other benzodiazepine    drugs, including chlordiazepoxide, prazepam, clorazepate,    halazepam, and temazepam.  Oxazepam and temazepam are available    as scheduled prescription medications.   Oxycodone                      567          EXPECTED   ng/mg creat   Oxymorphone                    148          EXPECTED   ng/mg creat   Noroxycodone                   1602          EXPECTED   ng/mg creat   Noroxymorphone                 2684         EXPECTED   ng/mg creat    Sources of oxycodone are scheduled prescription medications.    Oxymorphone, noroxycodone, and noroxymorphone are expected    metabolites of oxycodone. Oxymorphone is also available as a    scheduled prescription medication. ==================================================================== Test                      Result    Flag   Units      Ref Range   Creatinine              141  mg/dL      >=20 ==================================================================== Declared Medications:  The flagging and interpretation on this report are based on the  following declared medications.  Unexpected results may arise from  inaccuracies in the declared medications.  **Note: The testing scope of this panel includes these medications:  Diazepam (Valium)  Oxycodone (Roxicodone)  **Note: The testing scope of this panel does not include following  reported medications:  Colchicine  Hydrochlorothiazide (Maxzide)  Levothyroxine  Metoprolol (Lopressor)  Supplement  Triamterene (Maxzide)  Warfarin ==================================================================== For clinical consultation, please call [phone]. ====================================================================    UDS interpretation: Compliant          Medication Assessment Form: Reviewed. Patient indicates being compliant with therapy Treatment compliance: Compliant Risk Assessment Profile: Aberrant behavior: See prior evaluations. None observed or detected today Comorbid factors increasing risk of overdose: See prior notes. No additional risks detected today Risk of substance use disorder (SUD): Low  ORT Scoring interpretation table:  Score <3 = Low Risk for SUD  Score between 4-7 = Moderate Risk for SUD  Score >8 = High Risk for Opioid Abuse   Risk Mitigation Strategies:  Patient Counseling:  Covered Patient-Prescriber Agreement (PPA): Present and active  Notification to other healthcare providers: Done  Pharmacologic Plan: No change in therapy, at this time.             Laboratory Chemistry  Inflammation Markers (CRP: Acute Phase) (ESR: Chronic Phase) Lab Results  Component Value Date   CRP 1.6 03/07/2017   ESRSEDRATE 37 03/07/2017                         Rheumatology Markers No results found for: RF, ANA, LABURIC, URICUR, LYMEIGGIGMAB, LYMEABIGMQN                      Renal Function Markers Lab Results  Component Value Date   BUN 32 (H) 03/07/2017   CREATININE 1.30 (H) 03/07/2017   GFRAA 46 (L) 03/07/2017   GFRNONAA 40 (L) 03/07/2017                              Hepatic Function Markers Lab Results  Component Value Date   AST 24 03/07/2017   ALT 19 05/11/2016   ALBUMIN 4.4 03/07/2017   ALKPHOS 68 03/07/2017                        Electrolytes Lab Results  Component Value Date   NA 140 03/07/2017   K 4.0 03/07/2017   CL 97 03/07/2017   CALCIUM 10.2 03/07/2017   MG 2.3 03/07/2017                        Neuropathy Markers Lab Results  Component Value Date   VITAMINB12 661 03/07/2017                        Bone Pathology Markers Lab Results  Component Value Date   25OHVITD1 39 03/07/2017   25OHVITD2 5.6 03/07/2017   25OHVITD3 33 03/07/2017                         Coagulation Parameters Lab Results  Component Value Date   INR 1.8 05/24/2017   LABPROT 20.9 (H) 12/24/2015   APTT 36 12/08/2015   PLT 159 08/18/2016  Cardiovascular Markers Lab Results  Component Value Date   TROPONINI 0.04        NO INDICATION OF MYOCARDIAL INJURY. 05/22/2009   HGB 13.8 08/18/2016   HCT 40.2 08/18/2016                         CA Markers No results found for: CEA, CA125, LABCA2                      Note: Lab results reviewed.  Recent Diagnostic Imaging Results  CUP PACEART INCLINIC DEVICE CHECK Pacemaker check in clinic.  Normal device function. Thresholds, sensing, impedances consistent with previous measurements. Device programmed to maximize longevity. Chronic AF, +warfarin. No episode triggers enabled. Device programmed at appropriate safety  margins. Histogram distribution appropriate for patient activity level. Device programmed to optimize intrinsic conduction. Estimated longevity 2.5-3 years. Patient enrolled in TTM's with [employer]. Patient education completed. ROV with [facility] in 12  months.Levander Campion BSN, RN, CCDS  Complexity Note: Imaging results reviewed. Results shared with [name], using Layman's terms.                         Meds   Current Outpatient Medications:  .  colchicine 0.6 MG tablet, Take 0.6 mg by mouth 3 (three) times a week. , Disp: , Rfl:  .  diazepam (VALIUM) 10 MG tablet, Take 10 mg by mouth 2 (two) times daily as needed for anxiety., Disp: , Rfl:  .  liothyronine (CYTOMEL) 5 MCG tablet, Take 5 mcg by mouth daily., Disp: , Rfl:  .  metoprolol tartrate (LOPRESSOR) 50 MG tablet, Take 1 tablet (50 mg total) by mouth 2 (two) times daily., Disp: 180 tablet, Rfl: 1 .  thyroid (ARMOUR) 30 MG tablet, Take 30 mg by mouth daily before breakfast., Disp: , Rfl:  .  triamterene-hydrochlorothiazide (MAXZIDE) 75-50 MG tablet, Take 1 tablet by mouth daily., Disp: , Rfl:  .  warfarin (COUMADIN) 2.5 MG tablet, Take 1 tablet (2.5 mg total) by mouth daily., Disp: 100 tablet, Rfl: 0 .  [START ON 08/04/2017] oxyCODONE (OXY IR/ROXICODONE) 5 MG immediate release tablet, Take 1 tablet (5 mg total) by mouth 5 (five) times daily as needed for severe pain., Disp: 150 tablet, Rfl: 0 .  [START ON 07/05/2017] oxyCODONE (OXY IR/ROXICODONE) 5 MG immediate release tablet, Take 1 tablet (5 mg total) by mouth 5 (five) times daily as needed for severe pain., Disp: 150 tablet, Rfl: 0 .  [START ON 06/05/2017] oxyCODONE (OXY IR/ROXICODONE) 5 MG immediate release tablet, Take 1 tablet (5 mg total) by mouth 5 (five) times daily as  needed for severe pain., Disp: 150 tablet, Rfl: 0  ROS  Constitutional: Denies any fever or chills Gastrointestinal: No reported hemesis, hematochezia, vomiting, or acute GI distress Musculoskeletal: Denies any acute onset joint swelling, redness, loss of ROM, or weakness Neurological: No reported episodes of acute onset apraxia, aphasia, dysarthria, agnosia, amnesia, paralysis, loss of coordination, or loss of consciousness  Allergies  [name] is allergic to contrast media [iodinated diagnostic agents].  PFSH  Drug: [name]  reports that she does not use drugs. Alcohol:  reports that she does not drink alcohol. Tobacco:  reports that she has never smoked. She has never used smokeless tobacco. Medical:  has a past medical history of Anxiety, Aortic insufficiency, Arthritis, Asthma, Bronchitis, Bursitis, hip, Claustrophobia, Claustrophobia, Dysrhythmia, GERD (gastroesophageal reflux disease), Gout, Hyperlipidemia, Hypertension, Hypothyroidism, Mitral  regurgitation, Obesity, Panic attacks, Paroxysmal atrial fibrillation (HCC), PTSD (post-traumatic stress disorder), Rhinitis, Sick sinus syndrome ([place]), and Sleep apnea. Surgical: [name]  has a past surgical history that includes Tonsillectomy; Pacemaker insertion; Status post child birth x1; Rotator cuff repair; Dilatation & curettage/hysteroscopy with myosure (N/A, 08/03/2015); Insert / replace / remove pacemaker; and Total hip arthroplasty (Left, 12/22/2015). Family: family history includes Alzheimer's disease in her father; Arthritis in her sister; Cancer in her brother and maternal grandfather; Heart disease in her mother; Hypertension in her sister; Stroke in her mother.  Constitutional Exam  General appearance: Well nourished, well developed, and well hydrated. In no apparent acute distress Vitals:   05/29/17 0903  BP: 132/66  Pulse: 67  Resp: 16  Temp: 97.6 F (36.4 C)  TempSrc: Oral  SpO2: 100%  Weight: 211 lb (95.7 kg)   Height: 5' 1"  (1.549 m)  Psych/Mental status: Alert, oriented x 3 (person, place, & time)       Eyes: PERLA Respiratory: No evidence of acute respiratory distress  Lumbar Spine Area Exam  Skin & Axial Inspection: No masses, redness, or swelling Alignment: Symmetrical Functional ROM: Unrestricted ROM      Stability: No instability detected Muscle Tone/Strength: Functionally intact. No obvious neuro-muscular anomalies detected. Sensory (Neurological): Unimpaired Palpation: Uncomfortable       Provocative Tests: Lumbar Hyperextension and rotation test: evaluation deferred today       Lumbar Lateral bending test: evaluation deferred today       Patrick's Maneuver: evaluation deferred today                    Gait & Posture Assessment  Ambulation: Patient ambulates using a cane Gait: Relatively normal for age and body habitus Posture: Antalgic   Lower Extremity Exam    Side: Right lower extremity  Side: Left lower extremity  Skin & Extremity Inspection: Skin color, temperature, and hair growth are WNL. No peripheral edema or cyanosis. No masses, redness, swelling, asymmetry, or associated skin lesions. No contractures.  Skin & Extremity Inspection: Skin color, temperature, and hair growth are WNL. No peripheral edema or cyanosis. No masses, redness, swelling, asymmetry, or associated skin lesions. No contractures.  Functional ROM: Unrestricted ROM          Functional ROM: Decreased ROM for hip joint  Muscle Tone/Strength: Functionally intact. No obvious neuro-muscular anomalies detected.  Muscle Tone/Strength: Functionally intact. No obvious neuro-muscular anomalies detected.  Sensory (Neurological): Unimpaired  Sensory (Neurological): Unimpaired  Palpation: Complains of area being tender to palpation  Palpation: Complains of area being tender to palpation   Assessment  Primary Diagnosis & Pertinent Problem List: The primary encounter diagnosis was Chronic hip pain (Location of Primary  Source of Pain) (Left). Diagnoses of Lumbar facet arthropathy ([place]), Spinal stenosis of lumbar region with neurogenic claudication, and Chronic pain syndrome were also pertinent to this visit.  Status Diagnosis  Persistent Stable Stable 1. Chronic hip pain (Location of Primary Source of Pain) (Left)   2. Lumbar facet arthropathy (HCC)   3. Spinal stenosis of lumbar region with neurogenic claudication   4. Chronic pain syndrome     Problems updated and reviewed during this visit: Problem  OBESITY   Qualifier: Diagnosis of  By: Burnett Kanaris    Overview:  Qualifier: Diagnosis of  By: Burnett Kanaris   Last Assessment & Plan:  I've encouraged the patient to continue her gluten-free diet. She is also encouraged to increase her physical activity.   Essential Hypertension  Qualifier: Diagnosis of  By: Burnett Kanaris    Overview:  Qualifier: Diagnosis of  By: Burnett Kanaris   Last Assessment & Plan:  Her blood pressure is controlled. She is encouraged to lose weight.   Sick Sinus Syndrome (Hcc)   Overview:  Qualifier: Diagnosis of  By: Burnett Kanaris    Plan of Care  Pharmacotherapy (Medications Ordered): Meds ordered this encounter  Medications  . oxyCODONE (OXY IR/ROXICODONE) 5 MG immediate release tablet    Sig: Take 1 tablet (5 mg total) by mouth 5 (five) times daily as needed for severe pain.    Dispense:  150 tablet    Refill:  0    Patient may have prescription filled one day early if pharmacy is closed on scheduled refill date. Do not fill until: 08/04/2017 To last until:09/03/2017    Order Specific Question:   Supervising Provider    Answer:   Milinda Pointer [phone]  . oxyCODONE (OXY IR/ROXICODONE) 5 MG immediate release tablet    Sig: Take 1 tablet (5 mg total) by mouth 5 (five) times daily as needed for severe pain.    Dispense:  150 tablet    Refill:  0    Patient may have prescription filled one day early if pharmacy is closed on  scheduled refill date. Do not fill until: 07/05/2017 To last until: 08/04/2017    Order Specific Question:   Supervising Provider    Answer:   Milinda Pointer [phone]  . oxyCODONE (OXY IR/ROXICODONE) 5 MG immediate release tablet    Sig: Take 1 tablet (5 mg total) by mouth 5 (five) times daily as needed for severe pain.    Dispense:  150 tablet    Refill:  0    Patient may have prescription filled one day early if pharmacy is closed on scheduled refill date. Do not fill until: 06/05/2017 To last until: 07/05/2017    Order Specific Question:   Supervising Provider    Answer:   Milinda Pointer [phone]   New Prescriptions   No medications on file   Medications administered today: [name] had no medications administered during this visit. Lab-work, procedure(s), and/or referral(s): No orders of the defined types were placed in this encounter.  Imaging and/or referral(s): None  Interventional therapies: Planned, scheduled, and/or pending: Not at this time.   Considering: Not this time   Palliative PRN treatment(s): Not at this time   Provider-requested follow-up: Return in about 3 months (around 08/28/2017) for MedMgmt with Me Donella Stade [facility]).  Future Appointments  Date Time Provider [place]  06/07/2017 11:15 AM [facility]  08/28/2017 11:30 AM Vevelyn Francois, NP [facility] None   Primary Care Physician: Patient, No Pcp Per Location: [facility] Note by: Vevelyn Francois NP Date: 05/29/2017; Time: 2:04 PM  Pain Score Disclaimer: We use the NRS-11 scale. This is a self-reported, subjective measurement of pain severity with only modest accuracy. It is used primarily to identify changes within a particular patient. It must be understood that outpatient pain scales are significantly less accurate that those used for research, where they can be applied under ideal controlled circumstances with  minimal exposure to variables. In reality, the score is likely to be a combination of pain intensity and pain affect, where pain affect describes the degree of emotional arousal or changes in action readiness caused by the sensory experience of pain. Factors such as social and work situation, setting, emotional state, anxiety levels, expectation, and  prior pain experience may influence pain perception and show large inter-individual differences that may also be affected by time variables.  Patient instructions provided during this appointment: Patient Instructions   ____________________________________________________________________________________________  Medication Rules  Applies to: All patients receiving prescriptions (written or electronic).  Pharmacy of record: Pharmacy where electronic prescriptions will be sent. If written prescriptions are taken to a different pharmacy, please inform the nursing staff. The pharmacy listed in the electronic medical record should be the one where you would like electronic prescriptions to be sent.  Prescription refills: Only during scheduled appointments. Applies to both, written and electronic prescriptions.  NOTE: The following applies primarily to controlled substances (Opioid* Pain Medications).   Patient's responsibilities: 1. Pain Pills: Bring all pain pills to every appointment (except for procedure appointments). 2. Pill Bottles: Bring pills in original pharmacy bottle. Always bring newest bottle. Bring bottle, even if empty. 3. Medication refills: You are responsible for knowing and keeping track of what medications you need refilled. The day before your appointment, write a list of all prescriptions that need to be refilled. Bring that list to your appointment and give it to the admitting nurse. Prescriptions will be written only during appointments. If you forget a medication, it will not be "Called in", "Faxed", or "electronically sent". You  will need to get another appointment to get these prescribed. 4. Prescription Accuracy: You are responsible for carefully inspecting your prescriptions before leaving our office. Have the discharge nurse carefully go over each prescription with you, before taking them home. Make sure that your name is accurately spelled, that your address is correct. Check the name and dose of your medication to make sure it is accurate. Check the number of pills, and the written instructions to make sure they are clear and accurate. Make sure that you are given enough medication to last until your next medication refill appointment. 5. Taking Medication: Take medication as prescribed. Never take more pills than instructed. Never take medication more frequently than prescribed. Taking less pills or less frequently is permitted and encouraged, when it comes to controlled substances (written prescriptions).  6. Inform other Doctors: Always inform, all of your healthcare providers, of all the medications you take. 7. Pain Medication from other Providers: You are not allowed to accept any additional pain medication from any other Doctor or Healthcare provider. There are two exceptions to this rule. (see below) In the event that you require additional pain medication, you are responsible for notifying us, as stated below. 8. Medication Agreement: You are responsible for carefully reading and following our Medication Agreement. This must be signed before receiving any prescriptions from our practice. Safely store a copy of your signed Agreement. Violations to the Agreement will result in no further prescriptions. (Additional copies of our Medication Agreement are available upon request.) 9. Laws, Rules, & Regulations: All patients are expected to follow all Federal and [employer], [employer], Rules, [employer]. Ignorance of the Laws does not constitute a valid excuse. The use of any illegal substances is prohibited. 10. Adopted  CDC guidelines & recommendations: Target dosing levels will be at or below 60 MME/day. Use of benzodiazepines** is not recommended.  Exceptions: There are only two exceptions to the rule of not receiving pain medications from other Healthcare Providers. 1. Exception #1 (Emergencies): In the event of an emergency (i.e.: accident requiring emergency care), you are allowed to receive additional pain medication. However, you are responsible for: As soon as you are able, call our office [phone], at any time of the day or night, and leave a message stating your name, the date and nature of the emergency, and the name and dose of the medication prescribed. In the event that your call is answered by a member of our staff, make sure to document and save the date, time, and the name of the person that took your information.  2. Exception #2 (Planned Surgery): In the event that you are scheduled by another doctor or dentist to have any type of surgery or procedure, you are allowed (for a period no longer than 30 days), to receive additional pain medication, for the acute post-op pain. However, in this case, you are responsible for picking up a copy of our "Post-op Pain Management for Surgeons" handout, and giving it to your surgeon or dentist. This document is available at our office, and does not require an appointment to obtain it. Simply go to our office during business hours (Monday-Thursday from 8:00 AM to 4:00 PM) (Friday 8:00 AM to 12:00 Noon) or if you have a scheduled appointment with [place], prior to your surgery, and ask for it by name. In addition, you will need to provide [place] with your name, name of your surgeon, type of surgery, and date of procedure or surgery.  *Opioid medications include: morphine, codeine, oxycodone, oxymorphone, hydrocodone, hydromorphone, meperidine, tramadol, tapentadol, buprenorphine, fentanyl, methadone. **Benzodiazepine medications include: diazepam (Valium), alprazolam  (Xanax), clonazepam (Klonopine), lorazepam (Ativan), clorazepate (Tranxene), chlordiazepoxide (Librium), estazolam (Prosom), oxazepam (Serax), temazepam (Restoril), triazolam (Halcion) (Last updated: 04/26/2017) ____________________________________________________________________________________________   BMI Assessment: Estimated body mass index is 39.87 kg/m as calculated from the following:   Height as of this encounter: 5' 1"  (1.549 m).   Weight as of this encounter: 211 lb (95.7 kg).  BMI interpretation table: BMI level Category Range association with higher incidence of chronic pain  <18 kg/m2 Underweight   18.5-24.9 kg/m2 Ideal body weight   25-29.9 kg/m2 Overweight Increased incidence by 20%  30-34.9 kg/m2 Obese (Class I) Increased incidence by 68%  35-39.9 kg/m2 Severe obesity (Class II) Increased incidence by 136%  >40 kg/m2 Extreme obesity (Class III) Increased incidence by 254%   BMI Readings from Last 4 Encounters:  05/29/17 39.87 kg/m  03/12/17 41.00 kg/m  03/07/17 39.49 kg/m  12/05/16 39.49 kg/m   Wt Readings from Last 4 Encounters:  05/29/17 211 lb (95.7 kg)  03/12/17 217 lb (98.4 kg)  03/07/17 209 lb (94.8 kg)  12/05/16 209 lb (94.8 kg)

## 2017-05-29 NOTE — Progress Notes (Signed)
Nursing Pain Medication Assessment:  Safety precautions to be maintained throughout the outpatient stay will include: orient to surroundings, keep bed in low position, maintain call bell within reach at all times, provide assistance with transfer out of bed and ambulation.  Medication Inspection Compliance: Pill count conducted under aseptic conditions, in front of the patient. Neither the pills nor the bottle was removed from the patient's sight at any time. Once count was completed pills were immediately returned to the patient in their original bottle.  Medication: Oxycodone IR Pill/Patch Count: 40 of 150 pills remain Pill/Patch Appearance: Markings consistent with prescribed medication Bottle Appearance: Standard pharmacy container. Clearly labeled. Filled Date:03/10/ 2019 Last Medication intake:  Yesterday

## 2017-06-07 ENCOUNTER — Ambulatory Visit (INDEPENDENT_AMBULATORY_CARE_PROVIDER_SITE_OTHER): Payer: Medicare Other

## 2017-06-07 DIAGNOSIS — Z5181 Encounter for therapeutic drug level monitoring: Secondary | ICD-10-CM | POA: Diagnosis not present

## 2017-06-07 DIAGNOSIS — I482 Chronic atrial fibrillation, unspecified: Secondary | ICD-10-CM

## 2017-06-07 LAB — POCT INR: INR: 1.8

## 2017-06-07 NOTE — Patient Instructions (Signed)
Description   Today take an extra 2.5mg , then start taking 2.5 mg daily except for 5 mg on Tuesdays and Saturdays. Please call the coumadin clinic if any med changes or concerns. Recheck in 2 weeks. [facility]madin Clinic [phone] Main [phone]

## 2017-06-11 ENCOUNTER — Encounter: Payer: Self-pay | Admitting: Internal Medicine

## 2017-06-11 DIAGNOSIS — I495 Sick sinus syndrome: Secondary | ICD-10-CM | POA: Diagnosis not present

## 2017-06-11 DIAGNOSIS — R001 Bradycardia, unspecified: Secondary | ICD-10-CM | POA: Diagnosis not present

## 2017-06-21 ENCOUNTER — Ambulatory Visit (INDEPENDENT_AMBULATORY_CARE_PROVIDER_SITE_OTHER): Payer: Medicare Other | Admitting: *Deleted

## 2017-06-21 DIAGNOSIS — I482 Chronic atrial fibrillation, unspecified: Secondary | ICD-10-CM

## 2017-06-21 DIAGNOSIS — Z5181 Encounter for therapeutic drug level monitoring: Secondary | ICD-10-CM

## 2017-06-21 LAB — POCT INR: INR: 2.5

## 2017-06-21 NOTE — Patient Instructions (Signed)
Description   Continue  taking 2.5 mg daily except for 5 mg on Tuesdays and Saturdays. Please call the coumadin clinic if any med changes or concerns. Recheck in 3 weeks. [facility]madin Clinic [phone] Main [phone]

## 2017-06-25 ENCOUNTER — Other Ambulatory Visit: Payer: Self-pay | Admitting: Internal Medicine

## 2017-07-02 IMAGING — DX DG HIP (WITH OR WITHOUT PELVIS) 1V PORT*L*
2 series · 2 of 2 positions shown · non-contrast
Comparison: 08/11/2015

CLINICAL DATA: Postop total hip arthroplasty

EXAM:
DG HIP (WITH OR WITHOUT PELVIS) 1V PORT LEFT

[pelvis ap]
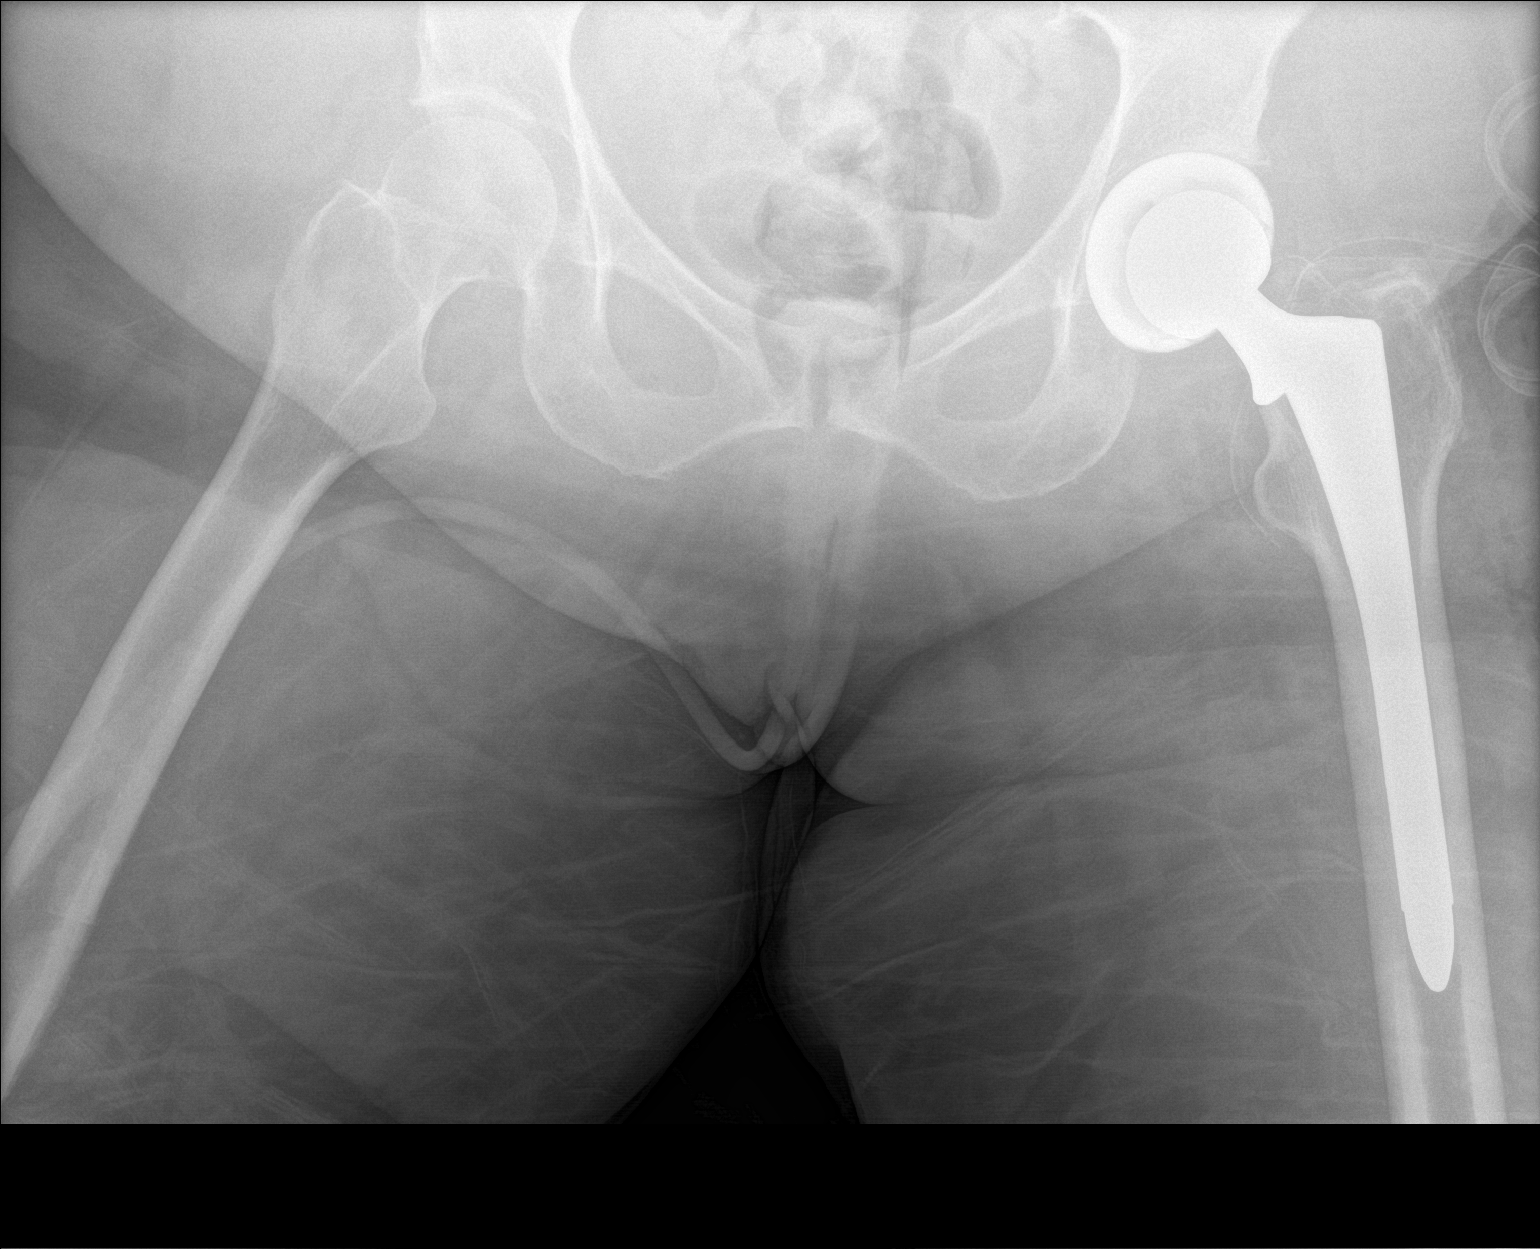

[hip lat]
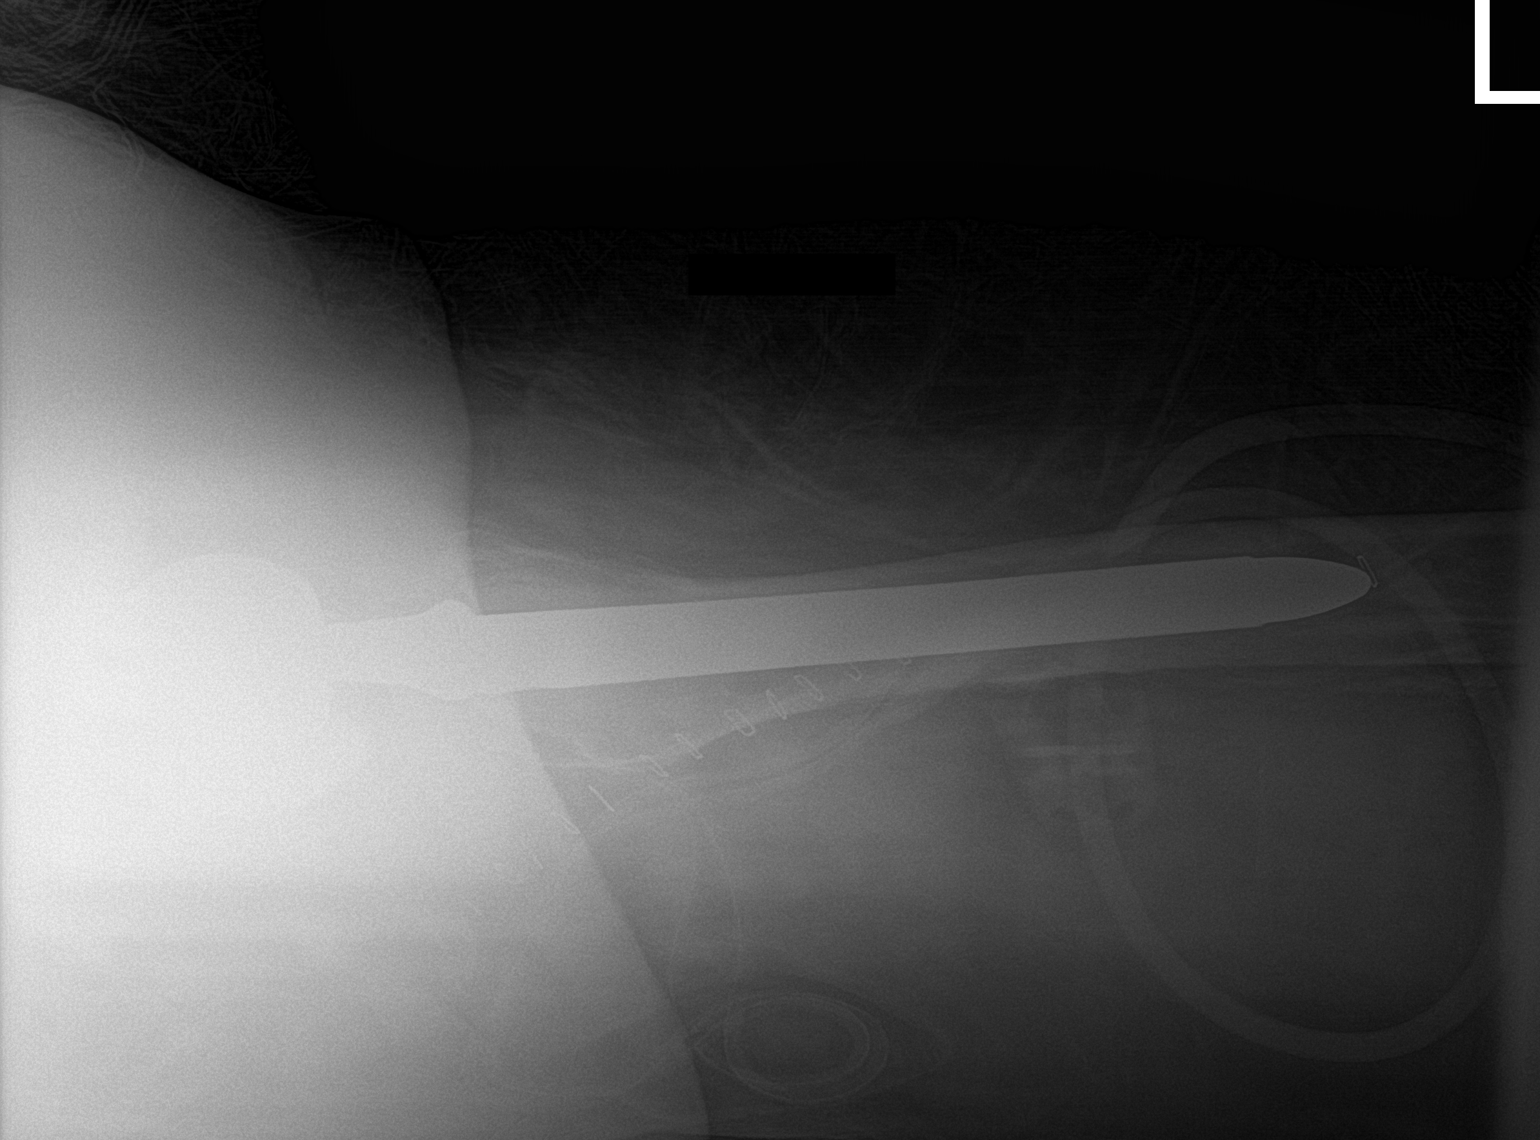

[2 of 2 positions shown; findings below may reference images not displayed]

FINDINGS: Left total hip arthroplasty changes noted. Components appear
aligned. No hardware abnormality or acute osseous finding.
Visualized lower bony pelvis and right hip also intact. Surgical
drain in place.
IMPRESSION: Expected appearance status post left total hip arthroplasty.

## 2017-07-12 ENCOUNTER — Encounter: Payer: Self-pay | Admitting: Internal Medicine

## 2017-07-12 ENCOUNTER — Ambulatory Visit (INDEPENDENT_AMBULATORY_CARE_PROVIDER_SITE_OTHER): Payer: Medicare Other | Admitting: *Deleted

## 2017-07-12 DIAGNOSIS — E559 Vitamin D deficiency, unspecified: Secondary | ICD-10-CM | POA: Diagnosis not present

## 2017-07-12 DIAGNOSIS — I482 Chronic atrial fibrillation, unspecified: Secondary | ICD-10-CM

## 2017-07-12 DIAGNOSIS — E569 Vitamin deficiency, unspecified: Secondary | ICD-10-CM | POA: Diagnosis not present

## 2017-07-12 DIAGNOSIS — R5382 Chronic fatigue, unspecified: Secondary | ICD-10-CM | POA: Diagnosis not present

## 2017-07-12 DIAGNOSIS — D510 Vitamin B12 deficiency anemia due to intrinsic factor deficiency: Secondary | ICD-10-CM | POA: Diagnosis not present

## 2017-07-12 DIAGNOSIS — Z79899 Other long term (current) drug therapy: Secondary | ICD-10-CM | POA: Diagnosis not present

## 2017-07-12 DIAGNOSIS — Z5181 Encounter for therapeutic drug level monitoring: Secondary | ICD-10-CM | POA: Diagnosis not present

## 2017-07-12 DIAGNOSIS — R799 Abnormal finding of blood chemistry, unspecified: Secondary | ICD-10-CM | POA: Diagnosis not present

## 2017-07-12 DIAGNOSIS — Z780 Asymptomatic menopausal state: Secondary | ICD-10-CM | POA: Diagnosis not present

## 2017-07-12 DIAGNOSIS — I7090 Unspecified atherosclerosis: Secondary | ICD-10-CM | POA: Diagnosis not present

## 2017-07-12 DIAGNOSIS — E039 Hypothyroidism, unspecified: Secondary | ICD-10-CM | POA: Diagnosis not present

## 2017-07-12 LAB — POCT INR: INR: 2.4

## 2017-07-12 NOTE — Patient Instructions (Signed)
Description   Continue taking 2.5 mg daily except for 5 mg on Tuesdays and Saturdays. Please call the coumadin clinic if any med changes or concerns. Recheck in 3-4 weeks. [facility]in Clinic [phone] Main [phone]

## 2017-07-13 ENCOUNTER — Encounter: Payer: Self-pay | Admitting: Cardiology

## 2017-08-03 ENCOUNTER — Ambulatory Visit (INDEPENDENT_AMBULATORY_CARE_PROVIDER_SITE_OTHER): Payer: Medicare Other | Admitting: Cardiology

## 2017-08-03 ENCOUNTER — Encounter: Payer: Self-pay | Admitting: Cardiology

## 2017-08-03 ENCOUNTER — Ambulatory Visit (INDEPENDENT_AMBULATORY_CARE_PROVIDER_SITE_OTHER): Payer: Medicare Other | Admitting: *Deleted

## 2017-08-03 VITALS — BP 124/66 | HR 67 | Ht 61.0 in | Wt 214.0 lb

## 2017-08-03 DIAGNOSIS — I482 Chronic atrial fibrillation, unspecified: Secondary | ICD-10-CM

## 2017-08-03 DIAGNOSIS — Z950 Presence of cardiac pacemaker: Secondary | ICD-10-CM

## 2017-08-03 DIAGNOSIS — Z5181 Encounter for therapeutic drug level monitoring: Secondary | ICD-10-CM | POA: Diagnosis not present

## 2017-08-03 DIAGNOSIS — I10 Essential (primary) hypertension: Secondary | ICD-10-CM | POA: Diagnosis not present

## 2017-08-03 LAB — POCT INR: INR: 2.3 (ref 2.0–3.0)

## 2017-08-03 MED ORDER — METOPROLOL TARTRATE 50 MG PO TABS: 75.0000 mg | ORAL_TABLET | Freq: Two times a day (BID) | ORAL | 3 refills | Status: DC

## 2017-08-03 NOTE — Progress Notes (Signed)
Cardiology Office Note:    Date:  08/03/2017   ID:  [name], DOB 07/06/1940, MRN [id number]  PCP:  Bryson Corona, NP  Cardiologist:  Dorris Carnes, MD  Electrophysiologist: Dr. Lovena Le  Referring MD: No ref. provider found   Chief Complaint  Patient presents with  . Atrial Fibrillation    Follow up    History of Present Illness:    [name] is a 77 y.o. female with a past medical history significant for hypertension, symptomatic bradycardia status post permanent pacemaker, chronic atrial fibrillation on warfarin, hypothyroidism and morbid obesity. [name] was last seen by Dr. Harrington Challenger on 08/18/2016 at which time she was doing well.  [name] is here with her husband. Uses cane but able to participate in ADLs, cooking, light housework. Goes out shopping and to church. She denies any chest discomfort or shortness of breath. She reports that she needs her metoprolol adjusted. She has been feeling her heart rate going fast at times and it is aggravating to her since her metoprolol was reduced from 75mg  bid to 50 mg bid. She did better on the 75 mg. She has occ dizziness in am upon first rising but this clears quickly. Drinks 64 oz of water per day. Limits salt in diet. Home BP running 107-120's/60's.   Past Medical History:  Diagnosis Date  . Anxiety   . Aortic insufficiency    Trivial  . Arthritis   . Asthma   . Bronchitis   . Bursitis, hip   . Claustrophobia   . Claustrophobia   . Dysrhythmia    a fib  . GERD (gastroesophageal reflux disease)    history of  . Gout   . Hyperlipidemia   . Hypertension   . Hypothyroidism   . Mitral regurgitation   . Obesity   . Panic attacks   . Paroxysmal atrial fibrillation (HCC)   . PTSD (post-traumatic stress disorder)   . Rhinitis   . Sick sinus syndrome ([place])   . Sleep apnea    was ordered CPAP, but never fitted for one years ago, Dr has retired    Past Surgical History:  Procedure Laterality Date  . DILATATION &  CURETTAGE/HYSTEROSCOPY WITH MYOSURE N/A 08/03/2015   Procedure: DILATATION & CURETTAGE/HYSTEROSCOPY WITH MYOSURE;  Surgeon: Alden Hipp, MD;  Location: [facility];  Service: Gynecology;  Laterality: N/A;  Patient has an ICD   . INSERT / REPLACE / REMOVE PACEMAKER    . PACEMAKER INSERTION     Status post insertion  . ROTATOR CUFF REPAIR     left  . Status post child birth x1    . TONSILLECTOMY    . TOTAL HIP ARTHROPLASTY Left 12/22/2015   Procedure: TOTAL HIP ARTHROPLASTY;  Surgeon: Dereck Leep, MD;  Location: [facility];  Service: Orthopedics;  Laterality: Left;    Current Medications: Current Meds  Medication Sig  . colchicine 0.6 MG tablet Take 0.6 mg by mouth 3 (three) times a week.   . diazepam (VALIUM) 10 MG tablet Take 10 mg by mouth 2 (two) times daily as needed for anxiety.  Marland Kitchen liothyronine (CYTOMEL) 5 MCG tablet Take 5 mcg by mouth daily.  Derrill Memo ON 08/04/2017] oxyCODONE (OXY IR/ROXICODONE) 5 MG immediate release tablet Take 1 tablet (5 mg total) by mouth 5 (five) times daily as needed for severe pain.  Marland Kitchen thyroid (ARMOUR) 30 MG tablet Take 30 mg by mouth daily before breakfast.  . triamterene-hydrochlorothiazide (MAXZIDE) 75-50 MG tablet Take 1 tablet  by mouth daily.  Marland Kitchen warfarin (COUMADIN) 2.5 MG tablet TAKE 1 TABLET DAILY  . [DISCONTINUED] metoprolol tartrate (LOPRESSOR) 50 MG tablet Take 1 tablet (50 mg total) by mouth 2 (two) times daily.     Allergies:   Contrast media [iodinated diagnostic agents]   Social History   Socioeconomic History  . Marital status: Married    Spouse name: Not on file  . Number of children: 1  . Years of education: Not on file  . Highest education level: Not on file  Occupational History  . 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 Financial resource strain: Not on file  . Food insecurity:    Worry: Not on file    Inability: Not on file  . Transportation needs:    Medical: Not on file    Non-medical: Not on file   Tobacco Use  . Smoking status: Never Smoker  . Smokeless tobacco: Never Used  Substance and Sexual Activity  . Alcohol use: No    Alcohol/week: 0.0 oz  . Drug use: No  . Sexual activity: Never    Birth control/protection: Abstinence  Lifestyle  . Physical activity:    Days per week: Not on file    Minutes per session: Not on file  . Stress: Not on file  Relationships  . Social connections:    Talks on phone: Not on file    Gets together: Not on file    Attends religious service: Not on file    Active member of club or organization: Not on file    Attends meetings of clubs or organizations: Not on file    Relationship status: Not on file  Other Topics Concern  . Not on file  Social History Narrative  . Not on file     Family History: The patient's family history includes Alzheimer's disease in her father; Arthritis in her sister; Cancer in her brother and maternal grandfather; Heart disease in her mother; Hypertension in her sister; Stroke in her mother. ROS:   Please see the history of present illness.     All other systems reviewed and are negative.  EKGs/Labs/Other Studies Reviewed:    The following studies were reviewed today: none  EKG:  EKG is  ordered today.  The ekg ordered today demonstrates underlying atrial fibrillation with intermittent V pacing.   Recent Labs: 08/18/2016: Hemoglobin 13.8; Platelets 159 03/07/2017: BUN 32; Creatinine, Ser 1.30; Magnesium 2.3; Potassium 4.0; Sodium 140   Recent Lipid Panel    Component Value Date/Time   CHOL 208 (H) 11/07/2013 1424   TRIG 142.0 11/07/2013 1424   HDL 50.00 11/07/2013 1424   CHOLHDL 4 11/07/2013 1424   VLDL 28.4 11/07/2013 1424   LDLCALC 130 (H) 11/07/2013 1424    Physical Exam:    VS:  BP 124/66   Pulse 67   Ht 5\' 1"  (1.549 m)   Wt 214 lb (97.1 kg)   SpO2 95%   BMI 40.43 kg/m     Wt Readings from Last 3 Encounters:  08/03/17 214 lb (97.1 kg)  05/29/17 211 lb (95.7 kg)  03/12/17 217 lb (98.4  kg)     Physical Exam  Constitutional: She is oriented to person, place, and time. She appears well-developed and well-nourished. No distress.  HENT:  Head: Normocephalic and atraumatic.  Neck: Normal range of motion. Neck supple. No JVD present.  Cardiovascular: Normal rate. An irregularly irregular rhythm present. Exam reveals no gallop and  no friction rub.  No murmur heard. Pulmonary/Chest: Effort normal and breath sounds normal. No respiratory distress. She has no wheezes. She has no rales.  Abdominal: Soft. Bowel sounds are normal.  Musculoskeletal: Normal range of motion. She exhibits no edema.  Neurological: She is alert and oriented to person, place, and time.  Skin: Skin is warm and dry.  Psychiatric: She has a normal mood and affect. Her behavior is normal. Thought content normal.  Vitals reviewed.   ASSESSMENT:    1. Chronic atrial fibrillation (HCC)   2. Cardiac pacemaker in situ   3. Essential hypertension    PLAN:    In order of problems listed above:  Permanent atrial fibrillation: She is rate controlled on metoprolol with permanent pacemaker.  She is anticoagulated with warfarin for stroke risk reduction.  No unusual bleeding.  She reports recent CBC and BMet and TSH at her PCP. She has had some heart racing that bothers her since her metoprolol was decreased. She feels that she did better on 75 mg bid. Will increase the dose. Pt advised that she will need to watch her BP. Advised to call the office if her SBP is consistently running <100 esp if dizziness. She understands.   Pacemaker: [facility] pacemaker for symptomatic bradycardia followed by Dr. Lovena Le. Has been in for 10 yrs may need replacement soon. 02/2017 report said 2.5-3 yrs longevity.   Hypertension: Metoprolol and Maxzide.   Labs recently done 3 weeks ago at PCP. Pt will have her send results on June 19 at her appt. BP well controlled. Watch BP response to increased metoprolol. May need to adjust the  Maxzide.  Hypothyroidism: Managed by PCP with reported recent labs. Pt has PCP office visit next week.    Medication Adjustments/Labs and Tests Ordered: Current medicines are reviewed at length with the patient today.  Concerns regarding medicines are outlined above. Labs and tests ordered and medication changes are outlined in the patient instructions below:  Patient Instructions  Medication Instructions: Your physician has recommended you make the following change in your medication:  INCREASE: Metoprolol to 75 mg twice a day (Take 1 tablet and a half 2 times daily)   Labwork: None Ordered  Procedures/Testing: None Ordered  Follow-Up: Your physician wants you to follow-up in: 1 year with Dr.Ross You will receive a reminder letter in the mail two months in advance. If you don't receive a letter, please call our office to schedule the follow-up appointment.   Any Additional Special Instructions Will Be Listed Below (If Applicable).     If you need a refill on your cardiac medications before your next appointment, please call your pharmacy.      Signed, Daune Perch, NP  08/03/2017 2:16 PM    [facility]

## 2017-08-03 NOTE — Patient Instructions (Signed)
Description   Continue taking 2.5 mg daily except for 5 mg on Tuesdays and Saturdays. Please call the coumadin clinic if any med changes or concerns. Recheck in 4 weeks. Coumadin Clinic [phone] Main [phone]

## 2017-08-03 NOTE — Patient Instructions (Addendum)
Medication Instructions: Your physician has recommended you make the following change in your medication:  INCREASE: Metoprolol to 75 mg twice a day (Take 1 tablet and a half 2 times daily)   Labwork: None Ordered  Procedures/Testing: None Ordered  Follow-Up: Your physician wants you to follow-up in: 1 year with Dr.Ross You will receive a reminder letter in the mail two months in advance. If you don't receive a letter, please call our office to schedule the follow-up appointment.   Any Additional Special Instructions Will Be Listed Below (If Applicable).     If you need a refill on your cardiac medications before your next appointment, please call your pharmacy.

## 2017-08-15 DIAGNOSIS — I10 Essential (primary) hypertension: Secondary | ICD-10-CM | POA: Diagnosis not present

## 2017-08-15 DIAGNOSIS — M109 Gout, unspecified: Secondary | ICD-10-CM | POA: Diagnosis not present

## 2017-08-15 DIAGNOSIS — F419 Anxiety disorder, unspecified: Secondary | ICD-10-CM | POA: Diagnosis not present

## 2017-08-15 DIAGNOSIS — E039 Hypothyroidism, unspecified: Secondary | ICD-10-CM | POA: Diagnosis not present

## 2017-08-28 ENCOUNTER — Other Ambulatory Visit: Payer: Self-pay

## 2017-08-28 ENCOUNTER — Ambulatory Visit: Payer: Medicare Other | Attending: Nurse Practitioner | Admitting: Nurse Practitioner

## 2017-08-28 ENCOUNTER — Encounter: Payer: Medicare Other | Admitting: Nurse Practitioner

## 2017-08-28 ENCOUNTER — Encounter: Payer: Self-pay | Admitting: Nurse Practitioner

## 2017-08-28 VITALS — BP 150/70 | Temp 97.9°F | Resp 16 | Ht 60.0 in | Wt 208.0 lb

## 2017-08-28 DIAGNOSIS — M47816 Spondylosis without myelopathy or radiculopathy, lumbar region: Secondary | ICD-10-CM

## 2017-08-28 DIAGNOSIS — F4310 Post-traumatic stress disorder, unspecified: Secondary | ICD-10-CM | POA: Insufficient documentation

## 2017-08-28 DIAGNOSIS — M1288 Other specific arthropathies, not elsewhere classified, other specified site: Secondary | ICD-10-CM | POA: Insufficient documentation

## 2017-08-28 DIAGNOSIS — G894 Chronic pain syndrome: Secondary | ICD-10-CM | POA: Diagnosis not present

## 2017-08-28 DIAGNOSIS — Z950 Presence of cardiac pacemaker: Secondary | ICD-10-CM | POA: Insufficient documentation

## 2017-08-28 DIAGNOSIS — I495 Sick sinus syndrome: Secondary | ICD-10-CM | POA: Diagnosis not present

## 2017-08-28 DIAGNOSIS — Z79891 Long term (current) use of opiate analgesic: Secondary | ICD-10-CM | POA: Insufficient documentation

## 2017-08-28 DIAGNOSIS — I129 Hypertensive chronic kidney disease with stage 1 through stage 4 chronic kidney disease, or unspecified chronic kidney disease: Secondary | ICD-10-CM | POA: Insufficient documentation

## 2017-08-28 DIAGNOSIS — Z7901 Long term (current) use of anticoagulants: Secondary | ICD-10-CM | POA: Insufficient documentation

## 2017-08-28 DIAGNOSIS — G8929 Other chronic pain: Secondary | ICD-10-CM | POA: Diagnosis not present

## 2017-08-28 DIAGNOSIS — Z96642 Presence of left artificial hip joint: Secondary | ICD-10-CM | POA: Diagnosis not present

## 2017-08-28 DIAGNOSIS — M1610 Unilateral primary osteoarthritis, unspecified hip: Secondary | ICD-10-CM | POA: Diagnosis not present

## 2017-08-28 DIAGNOSIS — J45909 Unspecified asthma, uncomplicated: Secondary | ICD-10-CM | POA: Diagnosis not present

## 2017-08-28 DIAGNOSIS — E039 Hypothyroidism, unspecified: Secondary | ICD-10-CM | POA: Insufficient documentation

## 2017-08-28 DIAGNOSIS — M461 Sacroiliitis, not elsewhere classified: Secondary | ICD-10-CM

## 2017-08-28 DIAGNOSIS — M47898 Other spondylosis, sacral and sacrococcygeal region: Secondary | ICD-10-CM | POA: Insufficient documentation

## 2017-08-28 DIAGNOSIS — M47818 Spondylosis without myelopathy or radiculopathy, sacral and sacrococcygeal region: Secondary | ICD-10-CM | POA: Diagnosis not present

## 2017-08-28 DIAGNOSIS — M48061 Spinal stenosis, lumbar region without neurogenic claudication: Secondary | ICD-10-CM | POA: Insufficient documentation

## 2017-08-28 DIAGNOSIS — Z79899 Other long term (current) drug therapy: Secondary | ICD-10-CM | POA: Diagnosis not present

## 2017-08-28 DIAGNOSIS — Z6841 Body Mass Index (BMI) 40.0 and over, adult: Secondary | ICD-10-CM | POA: Insufficient documentation

## 2017-08-28 DIAGNOSIS — M87852 Other osteonecrosis, left femur: Secondary | ICD-10-CM | POA: Insufficient documentation

## 2017-08-28 DIAGNOSIS — E785 Hyperlipidemia, unspecified: Secondary | ICD-10-CM | POA: Insufficient documentation

## 2017-08-28 DIAGNOSIS — M25552 Pain in left hip: Secondary | ICD-10-CM | POA: Diagnosis not present

## 2017-08-28 DIAGNOSIS — N183 Chronic kidney disease, stage 3 (moderate): Secondary | ICD-10-CM | POA: Diagnosis not present

## 2017-08-28 MED ORDER — OXYCODONE HCL 5 MG PO TABS: 5.0000 mg | ORAL_TABLET | Freq: Every day | ORAL | 0 refills | Status: DC | PRN

## 2017-08-28 MED ORDER — OXYCODONE HCL 5 MG PO TABS
5.0000 mg | ORAL_TABLET | Freq: Every day | ORAL | 0 refills | Status: DC
Start: 2017-10-03 — End: 2017-10-18

## 2017-08-28 NOTE — Progress Notes (Signed)
Nursing Pain Medication Assessment:  Safety precautions to be maintained throughout the outpatient stay will include: orient to surroundings, keep bed in low position, maintain call bell within reach at all times, provide assistance with transfer out of bed and ambulation.  Medication Inspection Compliance: Pill count conducted under aseptic conditions, in front of the patient. Neither the pills nor the bottle was removed from the patient's sight at any time. Once count was completed pills were immediately returned to the patient in their original bottle.  Medication: Oxycodone IR Pill/Patch Count: 33 of 150 pills remain Pill/Patch Appearance: Markings consistent with prescribed medication Bottle Appearance: Standard pharmacy container. Clearly labeled. Filled Date: 5 / 9 / 2019 Last Medication intake:  Yesterday

## 2017-08-28 NOTE — Patient Instructions (Addendum)
____________________________________________________________________________________________  Medication Rules  Applies to: All patients receiving prescriptions (written or electronic).  Pharmacy of record: Pharmacy where electronic prescriptions will be sent. If written prescriptions are taken to a different pharmacy, please inform the nursing staff. The pharmacy listed in the electronic medical record should be the one where you would like electronic prescriptions to be sent.  Prescription refills: Only during scheduled appointments. Applies to both, written and electronic prescriptions.  NOTE: The following applies primarily to controlled substances (Opioid* Pain Medications).   Patient's responsibilities: 1. Pain Pills: Bring all pain pills to every appointment (except for procedure appointments). 2. Pill Bottles: Bring pills in original pharmacy bottle. Always bring newest bottle. Bring bottle, even if empty. 3. Medication refills: You are responsible for knowing and keeping track of what medications you need refilled. The day before your appointment, write a list of all prescriptions that need to be refilled. Bring that list to your appointment and give it to the admitting nurse. Prescriptions will be written only during appointments. If you forget a medication, it will not be "Called in", "Faxed", or "electronically sent". You will need to get another appointment to get these prescribed. 4. Prescription Accuracy: You are responsible for carefully inspecting your prescriptions before leaving our office. Have the discharge nurse carefully go over each prescription with you, before taking them home. Make sure that your name is accurately spelled, that your address is correct. Check the name and dose of your medication to make sure it is accurate. Check the number of pills, and the written instructions to make sure they are clear and accurate. Make sure that you are given enough medication to last  until your next medication refill appointment. 5. Taking Medication: Take medication as prescribed. Never take more pills than instructed. Never take medication more frequently than prescribed. Taking less pills or less frequently is permitted and encouraged, when it comes to controlled substances (written prescriptions).  6. Inform other Doctors: Always inform, all of your healthcare providers, of all the medications you take. 7. Pain Medication from other Providers: You are not allowed to accept any additional pain medication from any other Doctor or Healthcare provider. There are two exceptions to this rule. (see below) In the event that you require additional pain medication, you are responsible for notifying us, as stated below. 8. Medication Agreement: You are responsible for carefully reading and following our Medication Agreement. This must be signed before receiving any prescriptions from our practice. Safely store a copy of your signed Agreement. Violations to the Agreement will result in no further prescriptions. (Additional copies of our Medication Agreement are available upon request.) 9. Laws, Rules, & Regulations: All patients are expected to follow all Federal and State Laws, Statutes, Rules, & Regulations. Ignorance of the Laws does not constitute a valid excuse. The use of any illegal substances is prohibited. 10. Adopted CDC guidelines & recommendations: Target dosing levels will be at or below 60 MME/day. Use of benzodiazepines** is not recommended.  Exceptions: There are only two exceptions to the rule of not receiving pain medications from other Healthcare Providers. 1. Exception #1 (Emergencies): In the event of an emergency (i.e.: accident requiring emergency care), you are allowed to receive additional pain medication. However, you are responsible for: As soon as you are able, call our office [phone], at any time of the day or night, and leave a message stating your name, the  date and nature of the emergency, and the name and dose of the medication   prescribed. In the event that your call is answered by a member of our staff, make sure to document and save the date, time, and the name of the person that took your information.  2. Exception #2 (Planned Surgery): In the event that you are scheduled by another doctor or dentist to have any type of surgery or procedure, you are allowed (for a period no longer than 30 days), to receive additional pain medication, for the acute post-op pain. However, in this case, you are responsible for picking up a copy of our "Post-op Pain Management for Surgeons" handout, and giving it to your surgeon or dentist. This document is available at our office, and does not require an appointment to obtain it. Simply go to our office during business hours (Monday-Thursday from 8:00 AM to 4:00 PM) (Friday 8:00 AM to 12:00 Noon) or if you have a scheduled appointment with [place], prior to your surgery, and ask for it by name. In addition, you will need to provide [place] with your name, name of your surgeon, type of surgery, and date of procedure or surgery.  *Opioid medications include: morphine, codeine, oxycodone, oxymorphone, hydrocodone, hydromorphone, meperidine, tramadol, tapentadol, buprenorphine, fentanyl, methadone. **Benzodiazepine medications include: diazepam (Valium), alprazolam (Xanax), clonazepam (Klonopine), lorazepam (Ativan), clorazepate (Tranxene), chlordiazepoxide (Librium), estazolam (Prosom), oxazepam (Serax), temazepam (Restoril), triazolam (Halcion) (Last updated: 04/26/2017) ____________________________________________________________________________________________    BMI Assessment: Estimated body mass index is 40.62 kg/m as calculated from the following:   Height as of this encounter: 5' (1.524 m).   Weight as of this encounter: 208 lb (94.3 kg).  BMI interpretation table: BMI level Category Range association with higher incidence  of chronic pain  <18 kg/m2 Underweight   18.5-24.9 kg/m2 Ideal body weight   25-29.9 kg/m2 Overweight Increased incidence by 20%  30-34.9 kg/m2 Obese (Class I) Increased incidence by 68%  35-39.9 kg/m2 Severe obesity (Class II) Increased incidence by 136%  >40 kg/m2 Extreme obesity (Class III) Increased incidence by 254%   Patient's current BMI Ideal Body weight  Body mass index is 40.62 kg/m. Ideal body weight: 45.5 kg (100 lb 4.9 oz) Adjusted ideal body weight: 65 kg (143 lb 6.2 oz)   BMI Readings from Last 4 Encounters:  08/28/17 40.62 kg/m  08/03/17 40.43 kg/m  05/29/17 39.87 kg/m  03/12/17 41.00 kg/m   Wt Readings from Last 4 Encounters:  08/28/17 208 lb (94.3 kg)  08/03/17 214 lb (97.1 kg)  05/29/17 211 lb (95.7 kg)  03/12/17 217 lb (98.4 kg)

## 2017-08-28 NOTE — Progress Notes (Signed)
Patient's Name: [name]  MRN: [id number]  Referring Provider: Bryson Corona, NP  DOB: 04/16/1940  PCP: Bryson Corona, NP  DOS: 08/28/2017  Note by: Vevelyn Francois NP  Service setting: Ambulatory outpatient  Specialty: Interventional Pain Management  Location: [facility] (AMB) Pain Management Facility    Patient type: Established    Primary Reason(s) for Visit: Encounter for prescription drug management. (Level of risk: moderate)  CC: Hip Pain (left)  HPI  [name] is a 77 y.o. year old, female patient, who comes today for a medication management evaluation. She has HYPOTHYROIDISM; HYPERLIPIDEMIA; OBESITY; Essential hypertension; SICK SINUS SYNDROME; Cardiac pacemaker in situ; Cough; GERD (gastroesophageal reflux disease); Atrial fibrillation ([place]); Chronic hip pain (Location of Primary Source of Pain) (Left); Long term current use of opiate analgesic; Long term prescription opiate use; Opiate use (54 MME/Day); Encounter for therapeutic drug level monitoring; Disturbance of skin sensation; Osteoarthritis of hip (Left); Trochanteric bursitis of hip (Left); Lumbar central spinal stenosis (L4-5); Lumbar facet arthropathy ([place]); Osteoarthritis of sacroiliac joint (with vacuum phenomena in) (Bilateral); Avascular necrosis of left femoral head ([place]); Opioid-induced constipation ([place]); Hypothyroidism (acquired); Long term (current) use of opiate analgesic; Morbid obesity with BMI of 40.0-44.9, adult ([place]); PTSD (post-traumatic stress disorder); Spinal stenosis of lumbar region; S/P total hip arthroplasty; Chronic pain syndrome; Status post total replacement of left hip; CKD (chronic kidney disease) stage 3, GFR 30-59 ml/min ([place]); Anemia of chronic disease; Hypocalcemia; Disorder of bone, unspecified; Other specified health status; Encounter for therapeutic drug monitoring; and Sick sinus syndrome ([place]) on their problem list. Her primarily concern today is the Hip Pain (left)  Pain Assessment: Location:  Right, Left Hip Radiating: down leg to ankles Onset: More than a month ago Duration: Chronic pain Quality: Aching Severity: 4 /10 (subjective, self-reported pain score)  Note: Reported level is compatible with observation.                          Effect on ADL: prolonged standing and walking Modifying factors: medications, rest BP: (!) 150/70  HR:    Ms. Pridgen was last scheduled for an appointment on 05/29/2017 for medication management. During today's appointment we reviewed [name]'s chronic pain status, as well as her outpatient medication regimen. She admits that her pain is stable. She denies any concerns today. She denies any side effects of her current medication regimen.   The patient  reports that she does not use drugs. Her body mass index is 40.62 kg/m.  Further details on both, my assessment(s), as well as the proposed treatment plan, please see below.  Controlled Substance Pharmacotherapy Assessment REMS (Risk Evaluation and Mitigation Strategy)  Analgesic:Oxycodone IR 5 mg 1 tablet by mouth 5 times a day (25 mg/day) MME/day:37.62m/day  .  GIgnatius Specking RN  08/28/2017  9:56 AM  Sign at close encounter Nursing Pain Medication Assessment:  Safety precautions to be maintained throughout the outpatient stay will include: orient to surroundings, keep bed in low position, maintain call bell within reach at all times, provide assistance with transfer out of bed and ambulation.  Medication Inspection Compliance: Pill count conducted under aseptic conditions, in front of the patient. Neither the pills nor the bottle was removed from the patient's sight at any time. Once count was completed pills were immediately returned to the patient in their original bottle.  Medication: Oxycodone IR Pill/Patch Count: 33 of 150 pills remain Pill/Patch Appearance: Markings consistent with prescribed medication Bottle Appearance:  Standard pharmacy container. Clearly labeled. Filled Date:  5 / 9 / 2019 Last Medication intake:  Yesterday   Pharmacokinetics: Liberation and absorption (onset of action): WNL Distribution (time to peak effect): WNL Metabolism and excretion (duration of action): WNL         Pharmacodynamics: Desired effects: Analgesia: [name] reports >50% benefit. Functional ability: Patient reports that medication allows her to accomplish basic ADLs Clinically meaningful improvement in function (CMIF): Sustained CMIF goals met Perceived effectiveness: Described as relatively effective, allowing for increase in activities of daily living (ADL) Undesirable effects: Side-effects or Adverse reactions: None reported Monitoring: [place] PMP: Online review of the past 35-monthperiod conducted. Compliant with practice rules and regulations Last UDS on record: Summary  Date Value Ref Range Status  12/05/2016 FINAL  Final    Comment:    ==================================================================== TOXASSURE SELECT 13 (MW) ==================================================================== Test                             Result       Flag       Units Drug Present and Declared for Prescription Verification   Oxazepam                       30           EXPECTED   ng/mg creat   Temazepam                      17           EXPECTED   ng/mg creat    Oxazepam and temazepam are expected metabolites of diazepam.    Oxazepam is also an expected metabolite of other benzodiazepine    drugs, including chlordiazepoxide, prazepam, clorazepate,    halazepam, and temazepam.  Oxazepam and temazepam are available    as scheduled prescription medications.   Oxycodone                      567          EXPECTED   ng/mg creat   Oxymorphone                    148          EXPECTED   ng/mg creat   Noroxycodone                   1602         EXPECTED   ng/mg creat   Noroxymorphone                 2684         EXPECTED   ng/mg creat    Sources of oxycodone are scheduled  prescription medications.    Oxymorphone, noroxycodone, and noroxymorphone are expected    metabolites of oxycodone. Oxymorphone is also available as a    scheduled prescription medication. ==================================================================== Test                      Result    Flag   Units      Ref Range   Creatinine              141              mg/dL      >=20 ==================================================================== Declared Medications:  The flagging and interpretation on this report are  based on the  following declared medications.  Unexpected results may arise from  inaccuracies in the declared medications.  **Note: The testing scope of this panel includes these medications:  Diazepam (Valium)  Oxycodone (Roxicodone)  **Note: The testing scope of this panel does not include following  reported medications:  Colchicine  Hydrochlorothiazide (Maxzide)  Levothyroxine  Metoprolol (Lopressor)  Supplement  Triamterene (Maxzide)  Warfarin ==================================================================== For clinical consultation, please call [phone]. ====================================================================    UDS interpretation: Compliant          Medication Assessment Form: Reviewed. Patient indicates being compliant with therapy Treatment compliance: Compliant Risk Assessment Profile: Aberrant behavior: See prior evaluations. None observed or detected today Comorbid factors increasing risk of overdose: See prior notes. No additional risks detected today Risk of substance use disorder (SUD): Low Opioid Risk Tool - 08/28/17 1005      Family History of Substance Abuse   Alcohol  Negative    Illegal Drugs  Negative    Rx Drugs  Negative      Personal History of Substance Abuse   Alcohol  Negative    Illegal Drugs  Negative    Rx Drugs  Negative      Psychological Disease   Psychological Disease  Negative    Depression   Negative      Total Score   Opioid Risk Tool Scoring  0    Opioid Risk Interpretation  Low Risk      ORT Scoring interpretation table:  Score <3 = Low Risk for SUD  Score between 4-7 = Moderate Risk for SUD  Score >8 = High Risk for Opioid Abuse   Risk Mitigation Strategies:  Patient Counseling: Covered Patient-Prescriber Agreement (PPA): Present and active  Notification to other healthcare providers: Done  Pharmacologic Plan: No change in therapy, at this time.             Laboratory Chemistry  Inflammation Markers (CRP: Acute Phase) (ESR: Chronic Phase) Lab Results  Component Value Date   CRP 1.6 03/07/2017   ESRSEDRATE 37 03/07/2017                         Rheumatology Markers No results found for: RF, ANA, LABURIC, URICUR, LYMEIGGIGMAB, LYMEABIGMQN, HLAB27                      Renal Function Markers Lab Results  Component Value Date   BUN 32 (H) 03/07/2017   CREATININE 1.30 (H) 03/07/2017   BCR 25 03/07/2017   GFRAA 46 (L) 03/07/2017   GFRNONAA 40 (L) 03/07/2017                             Hepatic Function Markers Lab Results  Component Value Date   AST 24 03/07/2017   ALT 19 05/11/2016   ALBUMIN 4.4 03/07/2017   ALKPHOS 68 03/07/2017                        Electrolytes Lab Results  Component Value Date   NA 140 03/07/2017   K 4.0 03/07/2017   CL 97 03/07/2017   CALCIUM 10.2 03/07/2017   MG 2.3 03/07/2017                        Neuropathy Markers Lab Results  Component Value Date   VITAMINB12 661 03/07/2017  Bone Pathology Markers Lab Results  Component Value Date   25OHVITD1 39 03/07/2017   25OHVITD2 5.6 03/07/2017   25OHVITD3 33 03/07/2017                         Coagulation Parameters Lab Results  Component Value Date   INR 2.3 08/03/2017   LABPROT 20.9 (H) 12/24/2015   APTT 36 12/08/2015   PLT 159 08/18/2016                        Cardiovascular Markers Lab Results  Component Value Date   TROPONINI  0.04        NO INDICATION OF MYOCARDIAL INJURY. 05/22/2009   HGB 13.8 08/18/2016   HCT 40.2 08/18/2016                         CA Markers No results found for: CEA, CA125, LABCA2                      Note: Lab results reviewed.  Recent Diagnostic Imaging Results  CUP PACEART INCLINIC DEVICE CHECK Pacemaker check in clinic. Normal device function. Thresholds, sensing, impedances consistent with previous measurements. Device programmed to maximize longevity. Chronic AF, +warfarin. No episode triggers enabled. Device programmed at appropriate safety  margins. Histogram distribution appropriate for patient activity level. Device programmed to optimize intrinsic conduction. Estimated longevity 2.5-3 years. Patient enrolled in TTM's with [employer]. Patient education completed. ROV with [facility] in 12  months.Levander Campion BSN, RN, CCDS  Complexity Note: Imaging results reviewed. Results shared with Ms. Struckman, using Layman's terms.                         Meds   Current Outpatient Medications:  .  colchicine 0.6 MG tablet, Take 0.6 mg by mouth 3 (three) times a week. , Disp: , Rfl:  .  diazepam (VALIUM) 10 MG tablet, Take 10 mg by mouth 2 (two) times daily as needed for anxiety., Disp: , Rfl:  .  liothyronine (CYTOMEL) 5 MCG tablet, Take 5 mcg by mouth daily., Disp: , Rfl:  .  metoprolol tartrate (LOPRESSOR) 50 MG tablet, Take 1.5 tablets (75 mg total) by mouth 2 (two) times daily., Disp: 270 tablet, Rfl: 3 .  [START ON 11/02/2017] oxyCODONE (OXY IR/ROXICODONE) 5 MG immediate release tablet, Take 1 tablet (5 mg total) by mouth 5 (five) times daily as needed for severe pain., Disp: 150 tablet, Rfl: 0 .  thyroid (ARMOUR) 30 MG tablet, Take 30 mg by mouth daily before breakfast., Disp: , Rfl:  .  triamterene-hydrochlorothiazide (MAXZIDE) 75-50 MG tablet, Take 1 tablet by mouth daily., Disp: , Rfl:  .  warfarin (COUMADIN) 2.5 MG tablet, TAKE 1 TABLET DAILY, Disp: 100 tablet, Rfl: 1 .  [START ON 10/03/2017]  oxyCODONE (OXY IR/ROXICODONE) 5 MG immediate release tablet, Take 1 tablet (5 mg total) by mouth 5 (five) times daily., Disp: 150 tablet, Rfl: 0 .  [START ON 09/03/2017] oxyCODONE (OXY IR/ROXICODONE) 5 MG immediate release tablet, Take 1 tablet (5 mg total) by mouth 5 (five) times daily as needed for severe pain., Disp: 150 tablet, Rfl: 0  ROS  Constitutional: Denies any fever or chills Gastrointestinal: No reported hemesis, hematochezia, vomiting, or acute GI distress Musculoskeletal: Denies any acute onset joint swelling, redness, loss of ROM, or weakness Neurological: No reported episodes of acute onset apraxia,  aphasia, dysarthria, agnosia, amnesia, paralysis, loss of coordination, or loss of consciousness  Allergies  Ms. Leinberger is allergic to contrast media [iodinated diagnostic agents].  PFSH  Drug: Ms. Rawl  reports that she does not use drugs. Alcohol:  reports that she does not drink alcohol. Tobacco:  reports that she has never smoked. She has never used smokeless tobacco. Medical:  has a past medical history of Anxiety, Aortic insufficiency, Arthritis, Asthma, Bronchitis, Bursitis, hip, Claustrophobia, Claustrophobia, Dysrhythmia, GERD (gastroesophageal reflux disease), Gout, Hyperlipidemia, Hypertension, Hypothyroidism, Mitral regurgitation, Obesity, Panic attacks, Paroxysmal atrial fibrillation ([place]), PTSD (post-traumatic stress disorder), Rhinitis, Sick sinus syndrome ([place]), and Sleep apnea. Surgical: Ms. Lopata  has a past surgical history that includes Tonsillectomy; Pacemaker insertion; Status post child birth x1; Rotator cuff repair; Dilatation & curettage/hysteroscopy with myosure (N/A, 08/03/2015); Insert / replace / remove pacemaker; and Total hip arthroplasty (Left, 12/22/2015). Family: family history includes Alzheimer's disease in her father; Arthritis in her sister; Cancer in her brother and maternal grandfather; Heart disease in her mother; Hypertension in her sister; Stroke in  her mother.  Constitutional Exam  General appearance: Well nourished, well developed, and well hydrated. In no apparent acute distress Vitals:   08/28/17 0956  BP: (!) 150/70  Resp: 16  Temp: 97.9 F (36.6 C)  SpO2: 100%  Weight: 208 lb (94.3 kg)  Height: 5' (1.524 m)  Psych/Mental status: Alert, oriented x 3 (person, place, & time)       Eyes: PERLA Respiratory: No evidence of acute respiratory distress   Lumbar Spine Area Exam  Skin & Axial Inspection: No masses, redness, or swelling Alignment: Symmetrical Functional ROM: Unrestricted ROM       Stability: No instability detected Muscle Tone/Strength: Functionally intact. No obvious neuro-muscular anomalies detected. Sensory (Neurological): Unimpaired Palpation: No palpable anomalies       Provocative Tests: Lumbar Hyperextension/rotation test: deferred today       Lumbar quadrant test (Kemp's test): deferred today       Lumbar Lateral bending test: deferred today       Patrick's Maneuver: deferred today                   FABER test: deferred today       Thigh-thrust test: deferred today       S-I compression test: deferred today       S-I distraction test: deferred today        Gait & Posture Assessment  Ambulation: Unassisted Gait: Relatively normal for age and body habitus Posture: WNL   Lower Extremity Exam    Side: Right lower extremity  Side: Left lower extremity  Stability: No instability observed          Stability: No instability observed          Skin & Extremity Inspection: Skin color, temperature, and hair growth are WNL. No peripheral edema or cyanosis. No masses, redness, swelling, asymmetry, or associated skin lesions. No contractures.  Skin & Extremity Inspection: Evidence of prior arthroplastic surgery  Functional ROM: Unrestricted ROM                  Functional ROM: Adequate ROM for hip joint          Muscle Tone/Strength: Functionally intact. No obvious neuro-muscular anomalies detected.  Muscle  Tone/Strength: Functionally intact. No obvious neuro-muscular anomalies detected.  Sensory (Neurological): Unimpaired  Sensory (Neurological): Unimpaired  Palpation: No palpable anomalies  Palpation: No palpable anomalies   Assessment  Primary Diagnosis &  Pertinent Problem List: The primary encounter diagnosis was Chronic hip pain (Location of Primary Source of Pain) (Left). Diagnoses of Osteoarthritis of sacroiliac joint (with vacuum phenomena in) (Bilateral), Lumbar facet arthropathy ([place]), and Chronic pain syndrome were also pertinent to this visit.  Status Diagnosis  Controlled Controlled Controlled 1. Chronic hip pain (Location of Primary Source of Pain) (Left)   2. Osteoarthritis of sacroiliac joint (with vacuum phenomena in) (Bilateral)   3. Lumbar facet arthropathy ([place])   4. Chronic pain syndrome     Problems updated and reviewed during this visit: No problems updated. Plan of Care  Pharmacotherapy (Medications Ordered): Meds ordered this encounter  Medications  . oxyCODONE (OXY IR/ROXICODONE) 5 MG immediate release tablet    Sig: Take 1 tablet (5 mg total) by mouth 5 (five) times daily as needed for severe pain.    Dispense:  150 tablet    Refill:  0    Patient may have prescription filled one day early if pharmacy is closed on scheduled refill date. Do not fill until: 11/02/2017 To last until:12/02/2017    Order Specific Question:   Supervising Provider    Answer:   Milinda Pointer [phone]  . oxyCODONE (OXY IR/ROXICODONE) 5 MG immediate release tablet    Sig: Take 1 tablet (5 mg total) by mouth 5 (five) times daily.    Dispense:  150 tablet    Refill:  0    Do not place this medication, or any other prescription from our practice, on "Automatic Refill". Patient may have prescription filled one day early if pharmacy is closed on scheduled refill date. Do not fill until:10/03/2017 To last until:11/02/2017    Order Specific Question:   Supervising Provider    Answer:    Milinda Pointer [phone]  . oxyCODONE (OXY IR/ROXICODONE) 5 MG immediate release tablet    Sig: Take 1 tablet (5 mg total) by mouth 5 (five) times daily as needed for severe pain.    Dispense:  150 tablet    Refill:  0    Patient may have prescription filled one day early if pharmacy is closed on scheduled refill date. Do not fill until:09/03/2017 To last until: 10/03/2017    Order Specific Question:   Supervising Provider    Answer:   Milinda Pointer [phone]   New Prescriptions   No medications on file   Medications administered today: Odean Luton had no medications administered during this visit. Lab-work, procedure(s), and/or referral(s): No orders of the defined types were placed in this encounter.  Imaging and/or referral(s): None  Interventional therapies: Planned, scheduled, and/or pending: Not at this time.   Considering: Not this time   Palliative PRN treatment(s): Not at this time    Provider-requested follow-up: Return in about 3 months (around 11/28/2017) for MedMgmt with Me Donella Stade [facility] Pace).  Future Appointments  Date Time Provider [place]  09/03/2017 11:15 AM [facility]  11/28/2017  9:45 AM Vevelyn Francois, NP [facility] None   Primary Care Physician: Bryson Corona, NP Location: [facility] Note by: Vevelyn Francois NP Date: 08/28/2017; Time: 6:55 PM  Pain Score Disclaimer: We use the NRS-11 scale. This is a self-reported, subjective measurement of pain severity with only modest accuracy. It is used primarily to identify changes within a particular patient. It must be understood that outpatient pain scales are significantly less accurate that those used for research, where they can be applied under ideal controlled circumstances with minimal exposure to variables.  In reality, the score is likely to be a combination of pain intensity and pain affect, where pain affect  describes the degree of emotional arousal or changes in action readiness caused by the sensory experience of pain. Factors such as social and work situation, setting, emotional state, anxiety levels, expectation, and prior pain experience may influence pain perception and show large inter-individual differences that may also be affected by time variables.  Patient instructions provided during this appointment: Patient Instructions   ____________________________________________________________________________________________  Medication Rules  Applies to: All patients receiving prescriptions (written or electronic).  Pharmacy of record: Pharmacy where electronic prescriptions will be sent. If written prescriptions are taken to a different pharmacy, please inform the nursing staff. The pharmacy listed in the electronic medical record should be the one where you would like electronic prescriptions to be sent.  Prescription refills: Only during scheduled appointments. Applies to both, written and electronic prescriptions.  NOTE: The following applies primarily to controlled substances (Opioid* Pain Medications).   Patient's responsibilities: 1. Pain Pills: Bring all pain pills to every appointment (except for procedure appointments). 2. Pill Bottles: Bring pills in original pharmacy bottle. Always bring newest bottle. Bring bottle, even if empty. 3. Medication refills: You are responsible for knowing and keeping track of what medications you need refilled. The day before your appointment, write a list of all prescriptions that need to be refilled. Bring that list to your appointment and give it to the admitting nurse. Prescriptions will be written only during appointments. If you forget a medication, it will not be "Called in", "Faxed", or "electronically sent". You will need to get another appointment to get these prescribed. 4. Prescription Accuracy: You are responsible for carefully inspecting  your prescriptions before leaving our office. Have the discharge nurse carefully go over each prescription with you, before taking them home. Make sure that your name is accurately spelled, that your address is correct. Check the name and dose of your medication to make sure it is accurate. Check the number of pills, and the written instructions to make sure they are clear and accurate. Make sure that you are given enough medication to last until your next medication refill appointment. 5. Taking Medication: Take medication as prescribed. Never take more pills than instructed. Never take medication more frequently than prescribed. Taking less pills or less frequently is permitted and encouraged, when it comes to controlled substances (written prescriptions).  6. Inform other Doctors: Always inform, all of your healthcare providers, of all the medications you take. 7. Pain Medication from other Providers: You are not allowed to accept any additional pain medication from any other Doctor or Healthcare provider. There are two exceptions to this rule. (see below) In the event that you require additional pain medication, you are responsible for notifying us, as stated below. 8. Medication Agreement: You are responsible for carefully reading and following our Medication Agreement. This must be signed before receiving any prescriptions from our practice. Safely store a copy of your signed Agreement. Violations to the Agreement will result in no further prescriptions. (Additional copies of our Medication Agreement are available upon request.) 9. Laws, Rules, & Regulations: All patients are expected to follow all Federal and [employer], [employer], Rules, [employer]. Ignorance of the Laws does not constitute a valid excuse. The use of any illegal substances is prohibited. 10. Adopted CDC guidelines & recommendations: Target dosing levels will be at or below 60 MME/day. Use of benzodiazepines** is not  recommended.  Exceptions: There are only  two exceptions to the rule of not receiving pain medications from other Healthcare Providers. 1. Exception #1 (Emergencies): In the event of an emergency (i.e.: accident requiring emergency care), you are allowed to receive additional pain medication. However, you are responsible for: As soon as you are able, call our office [phone], at any time of the day or night, and leave a message stating your name, the date and nature of the emergency, and the name and dose of the medication prescribed. In the event that your call is answered by a member of our staff, make sure to document and save the date, time, and the name of the person that took your information.  2. Exception #2 (Planned Surgery): In the event that you are scheduled by another doctor or dentist to have any type of surgery or procedure, you are allowed (for a period no longer than 30 days), to receive additional pain medication, for the acute post-op pain. However, in this case, you are responsible for picking up a copy of our "Post-op Pain Management for Surgeons" handout, and giving it to your surgeon or dentist. This document is available at our office, and does not require an appointment to obtain it. Simply go to our office during business hours (Monday-Thursday from 8:00 AM to 4:00 PM) (Friday 8:00 AM to 12:00 Noon) or if you have a scheduled appointment with [place], prior to your surgery, and ask for it by name. In addition, you will need to provide [place] with your name, name of your surgeon, type of surgery, and date of procedure or surgery.  *Opioid medications include: morphine, codeine, oxycodone, oxymorphone, hydrocodone, hydromorphone, meperidine, tramadol, tapentadol, buprenorphine, fentanyl, methadone. **Benzodiazepine medications include: diazepam (Valium), alprazolam (Xanax), clonazepam (Klonopine), lorazepam (Ativan), clorazepate (Tranxene), chlordiazepoxide (Librium), estazolam (Prosom),  oxazepam (Serax), temazepam (Restoril), triazolam (Halcion) (Last updated: 04/26/2017) ____________________________________________________________________________________________    BMI Assessment: Estimated body mass index is 40.62 kg/m as calculated from the following:   Height as of this encounter: 5' (1.524 m).   Weight as of this encounter: 208 lb (94.3 kg).  BMI interpretation table: BMI level Category Range association with higher incidence of chronic pain  <18 kg/m2 Underweight   18.5-24.9 kg/m2 Ideal body weight   25-29.9 kg/m2 Overweight Increased incidence by 20%  30-34.9 kg/m2 Obese (Class I) Increased incidence by 68%  35-39.9 kg/m2 Severe obesity (Class II) Increased incidence by 136%  >40 kg/m2 Extreme obesity (Class III) Increased incidence by 254%   Patient's current BMI Ideal Body weight  Body mass index is 40.62 kg/m. Ideal body weight: 45.5 kg (100 lb 4.9 oz) Adjusted ideal body weight: 65 kg (143 lb 6.2 oz)   BMI Readings from Last 4 Encounters:  08/28/17 40.62 kg/m  08/03/17 40.43 kg/m  05/29/17 39.87 kg/m  03/12/17 41.00 kg/m   Wt Readings from Last 4 Encounters:  08/28/17 208 lb (94.3 kg)  08/03/17 214 lb (97.1 kg)  05/29/17 211 lb (95.7 kg)  03/12/17 217 lb (98.4 kg)

## 2017-08-29 ENCOUNTER — Encounter: Payer: Medicare Other | Admitting: Nurse Practitioner

## 2017-09-03 ENCOUNTER — Ambulatory Visit (INDEPENDENT_AMBULATORY_CARE_PROVIDER_SITE_OTHER): Payer: Medicare Other | Admitting: *Deleted

## 2017-09-03 DIAGNOSIS — I482 Chronic atrial fibrillation, unspecified: Secondary | ICD-10-CM

## 2017-09-03 DIAGNOSIS — Z5181 Encounter for therapeutic drug level monitoring: Secondary | ICD-10-CM

## 2017-09-03 LAB — POCT INR: INR: 2.7 (ref 2.0–3.0)

## 2017-09-03 NOTE — Patient Instructions (Signed)
Description   Continue taking 2.5 mg daily except for 5 mg on Tuesdays and Saturdays. Please call the coumadin clinic if any med changes or concerns. Recheck in 6 weeks. Coumadin Clinic [phone] Main [phone]

## 2017-10-01 ENCOUNTER — Encounter: Payer: Self-pay | Admitting: Internal Medicine

## 2017-10-01 DIAGNOSIS — R001 Bradycardia, unspecified: Secondary | ICD-10-CM | POA: Diagnosis not present

## 2017-10-01 DIAGNOSIS — I495 Sick sinus syndrome: Secondary | ICD-10-CM

## 2017-10-16 ENCOUNTER — Emergency Department: Payer: Medicare Other

## 2017-10-16 ENCOUNTER — Encounter: Payer: Self-pay | Admitting: Emergency Medicine

## 2017-10-16 ENCOUNTER — Other Ambulatory Visit: Payer: Self-pay

## 2017-10-16 ENCOUNTER — Inpatient Hospital Stay
Admission: EM | Admit: 2017-10-16 | Discharge: 2017-10-18 | DRG: 948 | Disposition: A | Discharge status: Home Health | Payer: Medicare Other | Attending: Internal Medicine | Admitting: Internal Medicine

## 2017-10-16 DIAGNOSIS — F419 Anxiety disorder, unspecified: Secondary | ICD-10-CM | POA: Diagnosis present

## 2017-10-16 DIAGNOSIS — Z79899 Other long term (current) drug therapy: Secondary | ICD-10-CM

## 2017-10-16 DIAGNOSIS — I10 Essential (primary) hypertension: Secondary | ICD-10-CM | POA: Diagnosis not present

## 2017-10-16 DIAGNOSIS — Z7901 Long term (current) use of anticoagulants: Secondary | ICD-10-CM

## 2017-10-16 DIAGNOSIS — M109 Gout, unspecified: Secondary | ICD-10-CM | POA: Diagnosis not present

## 2017-10-16 DIAGNOSIS — F40240 Claustrophobia: Secondary | ICD-10-CM | POA: Diagnosis present

## 2017-10-16 DIAGNOSIS — Z79890 Hormone replacement therapy (postmenopausal): Secondary | ICD-10-CM

## 2017-10-16 DIAGNOSIS — E039 Hypothyroidism, unspecified: Secondary | ICD-10-CM | POA: Diagnosis present

## 2017-10-16 DIAGNOSIS — Z8249 Family history of ischemic heart disease and other diseases of the circulatory system: Secondary | ICD-10-CM

## 2017-10-16 DIAGNOSIS — F4310 Post-traumatic stress disorder, unspecified: Secondary | ICD-10-CM | POA: Diagnosis present

## 2017-10-16 DIAGNOSIS — Z91041 Radiographic dye allergy status: Secondary | ICD-10-CM

## 2017-10-16 DIAGNOSIS — F410 Panic disorder [episodic paroxysmal anxiety] without agoraphobia: Secondary | ICD-10-CM | POA: Diagnosis present

## 2017-10-16 DIAGNOSIS — I480 Paroxysmal atrial fibrillation: Secondary | ICD-10-CM | POA: Diagnosis present

## 2017-10-16 DIAGNOSIS — Z6841 Body Mass Index (BMI) 40.0 and over, adult: Secondary | ICD-10-CM | POA: Diagnosis not present

## 2017-10-16 DIAGNOSIS — I495 Sick sinus syndrome: Secondary | ICD-10-CM | POA: Diagnosis present

## 2017-10-16 DIAGNOSIS — R791 Abnormal coagulation profile: Secondary | ICD-10-CM | POA: Diagnosis not present

## 2017-10-16 DIAGNOSIS — N390 Urinary tract infection, site not specified: Secondary | ICD-10-CM | POA: Diagnosis not present

## 2017-10-16 DIAGNOSIS — M1990 Unspecified osteoarthritis, unspecified site: Secondary | ICD-10-CM | POA: Diagnosis present

## 2017-10-16 DIAGNOSIS — R319 Hematuria, unspecified: Secondary | ICD-10-CM | POA: Diagnosis not present

## 2017-10-16 DIAGNOSIS — I080 Rheumatic disorders of both mitral and aortic valves: Secondary | ICD-10-CM | POA: Diagnosis present

## 2017-10-16 DIAGNOSIS — G4730 Sleep apnea, unspecified: Secondary | ICD-10-CM | POA: Diagnosis present

## 2017-10-16 DIAGNOSIS — S9031XA Contusion of right foot, initial encounter: Secondary | ICD-10-CM | POA: Diagnosis not present

## 2017-10-16 DIAGNOSIS — E785 Hyperlipidemia, unspecified: Secondary | ICD-10-CM | POA: Diagnosis present

## 2017-10-16 DIAGNOSIS — K219 Gastro-esophageal reflux disease without esophagitis: Secondary | ICD-10-CM | POA: Diagnosis present

## 2017-10-16 DIAGNOSIS — J45909 Unspecified asthma, uncomplicated: Secondary | ICD-10-CM | POA: Diagnosis present

## 2017-10-16 DIAGNOSIS — I4891 Unspecified atrial fibrillation: Secondary | ICD-10-CM | POA: Diagnosis not present

## 2017-10-16 LAB — URINALYSIS, COMPLETE (UACMP) WITH MICROSCOPIC
RBC / HPF: >50 RBC/hpf — ABNORMAL HIGH (ref 0–5)
SPECIFIC GRAVITY, URINE: 1.02 (ref 1.005–1.030)
Squamous Epithelial / LPF: NONE SEEN (ref 0–5)
WBC, UA: >50 WBC/hpf — ABNORMAL HIGH (ref 0–5)

## 2017-10-16 LAB — COMPREHENSIVE METABOLIC PANEL
ALBUMIN: 3.5 g/dL (ref 3.5–5.0)
ALK PHOS: 51 U/L (ref 38–126)
ALT: 10 U/L (ref 0–44)
ANION GAP: 10 (ref 5–15)
AST: 26 U/L (ref 15–41)
BILIRUBIN TOTAL: 0.9 mg/dL (ref 0.3–1.2)
BUN: 31 mg/dL — ABNORMAL HIGH (ref 8–23)
CALCIUM: 8.8 mg/dL — AB (ref 8.9–10.3)
CO2: 29 mmol/L (ref 22–32)
Chloride: 97 mmol/L — ABNORMAL LOW (ref 98–111)
Creatinine, Ser: 1.18 mg/dL — ABNORMAL HIGH (ref 0.44–1.00)
GFR calc non Af Amer: 43 mL/min — ABNORMAL LOW (ref >60)
GFR, EST AFRICAN AMERICAN: 50 mL/min — AB (ref >60)
GLUCOSE: 118 mg/dL — AB (ref 70–99)
POTASSIUM: 3.4 mmol/L — AB (ref 3.5–5.1)
Sodium: 136 mmol/L (ref 135–145)
TOTAL PROTEIN: 7.5 g/dL (ref 6.5–8.1)

## 2017-10-16 LAB — CBC WITH DIFFERENTIAL/PLATELET
BASOS ABS: 0 10*3/uL (ref 0–0.1)
BASOS PCT: 1 %
Eosinophils Absolute: 0.1 10*3/uL (ref 0–0.7)
Eosinophils Relative: 2 %
HEMATOCRIT: 38.7 % (ref 35.0–47.0)
HEMOGLOBIN: 13.7 g/dL (ref 12.0–16.0)
LYMPHS PCT: 14 %
Lymphs Abs: 1 10*3/uL (ref 1.0–3.6)
MCH: 32.4 pg (ref 26.0–34.0)
MCHC: 35.4 g/dL (ref 32.0–36.0)
MCV: 91.6 fL (ref 80.0–100.0)
MONO ABS: 0.7 10*3/uL (ref 0.2–0.9)
Monocytes Relative: 9 %
NEUTROS ABS: 5.7 10*3/uL (ref 1.4–6.5)
NEUTROS PCT: 74 %
Platelets: 188 10*3/uL (ref 150–440)
RBC: 4.23 MIL/uL (ref 3.80–5.20)
RDW: 14.2 % (ref 11.5–14.5)
WBC: 7.6 10*3/uL (ref 3.6–11.0)

## 2017-10-16 LAB — TYPE AND SCREEN
ABO/RH(D): O POS
ANTIBODY SCREEN: NEGATIVE

## 2017-10-16 LAB — URIC ACID: Uric Acid, Serum: 7 mg/dL (ref 2.5–7.1)

## 2017-10-16 LAB — HEMOGLOBIN: HEMOGLOBIN: 13.7 g/dL (ref 12.0–16.0)

## 2017-10-16 LAB — PROTIME-INR
INR: 6.86 — AB
Prothrombin Time: 59 seconds — ABNORMAL HIGH (ref 11.4–15.2)

## 2017-10-16 LAB — APTT: APTT: 127 s — AB (ref 24–36)

## 2017-10-16 MED ORDER — METOPROLOL TARTRATE 50 MG PO TABS
75.0000 mg | ORAL_TABLET | Freq: Two times a day (BID) | ORAL | Status: DC
  Administered 2017-10-16 – 2017-10-18 (×4): 75 mg via ORAL
  Filled 2017-10-16 (×5): qty 2

## 2017-10-16 MED ORDER — POLYETHYLENE GLYCOL 3350 17 G PO PACK: 17.0000 g | PACK | Freq: Every day | ORAL | Status: DC | PRN

## 2017-10-16 MED ORDER — TRAMADOL HCL 50 MG PO TABS
50.0000 mg | ORAL_TABLET | Freq: Four times a day (QID) | ORAL | Status: DC | PRN
  Administered 2017-10-16: 50 mg via ORAL
  Filled 2017-10-16: qty 1

## 2017-10-16 MED ORDER — ACETAMINOPHEN 325 MG PO TABS: 650.0000 mg | ORAL_TABLET | Freq: Four times a day (QID) | ORAL | Status: DC | PRN

## 2017-10-16 MED ORDER — ONDANSETRON HCL 4 MG PO TABS: 4.0000 mg | ORAL_TABLET | Freq: Four times a day (QID) | ORAL | Status: DC | PRN

## 2017-10-16 MED ORDER — ONDANSETRON HCL 4 MG/2ML IJ SOLN: 4.0000 mg | Freq: Four times a day (QID) | INTRAMUSCULAR | Status: DC | PRN

## 2017-10-16 MED ORDER — DIAZEPAM 5 MG PO TABS: 10.0000 mg | ORAL_TABLET | Freq: Two times a day (BID) | ORAL | Status: DC | PRN

## 2017-10-16 MED ORDER — COLCHICINE 0.6 MG PO TABS
0.6000 mg | ORAL_TABLET | Freq: Two times a day (BID) | ORAL | Status: DC
  Administered 2017-10-16 – 2017-10-18 (×5): 0.6 mg via ORAL
  Filled 2017-10-16 (×5): qty 1

## 2017-10-16 MED ORDER — OXYCODONE HCL 5 MG PO TABS
5.0000 mg | ORAL_TABLET | Freq: Four times a day (QID) | ORAL | Status: DC | PRN
  Administered 2017-10-16 – 2017-10-18 (×5): 5 mg via ORAL
  Filled 2017-10-16 (×5): qty 1

## 2017-10-16 MED ORDER — ALBUTEROL SULFATE (2.5 MG/3ML) 0.083% IN NEBU: 2.5000 mg | INHALATION_SOLUTION | RESPIRATORY_TRACT | Status: DC | PRN

## 2017-10-16 MED ORDER — THYROID 30 MG PO TABS
30.0000 mg | ORAL_TABLET | Freq: Every day | ORAL | Status: DC
  Administered 2017-10-17 – 2017-10-18 (×2): 30 mg via ORAL
  Filled 2017-10-16 (×2): qty 1

## 2017-10-16 MED ORDER — OXYCODONE HCL 5 MG PO TABS
5.0000 mg | ORAL_TABLET | Freq: Once | ORAL | Status: AC
  Administered 2017-10-16: 5 mg via ORAL
  Filled 2017-10-16: qty 1

## 2017-10-16 MED ORDER — LIOTHYRONINE SODIUM 5 MCG PO TABS
5.0000 ug | ORAL_TABLET | Freq: Every day | ORAL | Status: DC
  Administered 2017-10-16 – 2017-10-18 (×3): 5 ug via ORAL
  Filled 2017-10-16 (×3): qty 1

## 2017-10-16 MED ORDER — VITAMIN K1 10 MG/ML IJ SOLN
5.0000 mg | Freq: Once | INTRAVENOUS | Status: AC
  Administered 2017-10-16: 5 mg via INTRAVENOUS
  Filled 2017-10-16: qty 0.5

## 2017-10-16 MED ORDER — ALLOPURINOL 100 MG PO TABS
100.0000 mg | ORAL_TABLET | Freq: Two times a day (BID) | ORAL | Status: DC
  Administered 2017-10-16 – 2017-10-18 (×5): 100 mg via ORAL
  Filled 2017-10-16 (×5): qty 1

## 2017-10-16 MED ORDER — ACETAMINOPHEN 650 MG RE SUPP: 650.0000 mg | Freq: Four times a day (QID) | RECTAL | Status: DC | PRN

## 2017-10-16 NOTE — ED Notes (Signed)
Inpt MD at bedside

## 2017-10-16 NOTE — ED Notes (Signed)
Patient transported to room 227

## 2017-10-16 NOTE — Progress Notes (Signed)
Advance care planning  Purpose of Encounter Acute admission regarding hematuria and supratherapeutic INR.  CODE STATUS discussion.  Patient is alert and oriented.  Able to make medical decisions.  Wants her daughter [name] to be her healthcare power of attorney.  Does not have documentation of advanced directives.  Encouraged to finish this admission.  Patient being admitted for hematuria and supratherapeutic INR for monitoring.  Patient understands plan. Discussed regarding CODE STATUS.  After discussing with her daughter patient wants to be full code.  Orders entered.   Time spent -17 minutes

## 2017-10-16 NOTE — ED Triage Notes (Signed)
Pt here with c/o blood in urine for 2 days now, denies pain or burning with urination.  Appears in NAD.

## 2017-10-16 NOTE — Evaluation (Signed)
Physical Therapy Evaluation Patient Details Name: [name] MRN: [id number] DOB: [birth year] Today's Date: 10/16/2017   History of Present Illness  77 y/o female admitted with hematuria and elevated INR (6.8 at time of eval).  Pt is currently suffering from a gout flare up (R ankle) that has made it very difficult for her to stand/walk >1 week.   Clinical Impression  Pt showed good effort with PT exam and did relatively well with mobility in bed despite some sensitivity with R ankle gout/swelling.  However she needed a lot of cuing, assist and attempts to be able to get to standing and then was unable to meaningfully put any weight through the R LE and showed poor UE weakness to the point where she could not take enough weight off the R to allow L foot steppage (even with PT giving considerable unweighting support).  Pt not safe to go home and is far from her functional baseline, she will require STR to get back to her PLOF.   Follow Up Recommendations SNF    Equipment Recommendations  None recommended by PT    Recommendations for Other Services       Precautions / Restrictions Precautions Precautions: Fall Restrictions Weight Bearing Restrictions: No      Mobility  Bed Mobility Overal bed mobility: Modified Independent             General bed mobility comments: Pt was able to get herself to sitting EOB w/o assist  Transfers Overall transfer level: Needs assistance Equipment used: Rolling walker (2 wheeled) Transfers: Sit to/from Stand Sit to Stand: Mod assist         General transfer comment: Pt showed good effort in getting to standing but after multiple attempts with little to no assist and increased surface height she ultimately needed at least moderate assist to attain standing and essentially maintained only TTWBing on the R  Ambulation/Gait             General Gait Details: Unable to take weight well enough to take a step.  We did one side step along  EOB with unability to clear L LE and only heel-toed to the side before having too much R ankle pain and needing to sit right down.   Stairs            Wheelchair Mobility    Modified Rankin (Stroke Patients Only)       Balance Overall balance assessment: Modified Independent(highly reliant on RW in standing, no functional R foot WBing)                                           Pertinent Vitals/Pain Pain Assessment: 0-10 Pain Score: 7  Pain Location: R ankle, 10/10 pain with [place] expects to be discharged to:: Private residence Living Arrangements: Spouse/significant other Available Help at Discharge: Family(daughter is able to help some, husband just got out of STR)   Home Access: Stairs to enter Entrance Stairs-Rails: Can reach both Entrance Stairs-Number of Steps: 2   Home Equipment: [place] - 2 wheels;Cane - single point;Wheelchair - manual      Prior Function Level of Independence: Independent         Comments: Pt states she normally does not need an AD, has used walker X 1 week secondary to R gout pain     Hand Dominance  Extremity/Trunk Assessment   Upper Extremity Assessment Upper Extremity Assessment: Generalized weakness    Lower Extremity Assessment Lower Extremity Assessment: Generalized weakness(very sensitive with R foot palpation/WBing)       Communication   Communication: No difficulties  Cognition Arousal/Alertness: Awake/alert Behavior During Therapy: WFL for tasks assessed/performed Overall Cognitive Status: Within Functional Limits for tasks assessed                                        General Comments      Exercises     Assessment/Plan    PT Assessment Patient needs continued PT services  PT Problem List Decreased strength;Decreased range of motion;Decreased activity tolerance;Decreased balance;Decreased mobility;Decreased coordination;Decreased  knowledge of use of DME;Decreased safety awareness;Pain       PT Treatment Interventions DME instruction;Gait training;Stair training;Functional mobility training;Therapeutic activities;Therapeutic exercise;Balance training;Neuromuscular re-education;Patient/family education    PT Goals (Current goals can be found in the Care Plan section)  Acute Rehab PT Goals Patient Stated Goal: control the gout pain PT Goal Formulation: With patient/family Time For Goal Achievement: 10/30/17 Potential to Achieve Goals: Fair    Frequency Min 2X/week   Barriers to discharge        Co-evaluation               AM-PAC PT "6 Clicks" Daily Activity  Outcome Measure Difficulty turning over in bed (including adjusting bedclothes, sheets and blankets)?: None Difficulty moving from lying on back to sitting on the side of the bed? : A Little Difficulty sitting down on and standing up from a chair with arms (e.g., wheelchair, bedside commode, etc,.)?: Unable Help needed moving to and from a bed to chair (including a wheelchair)?: Total Help needed walking in hospital room?: Total Help needed climbing 3-5 steps with a railing? : Total 6 Click Score: 11    End of Session Equipment Utilized During Treatment: Gait belt Activity Tolerance: Patient limited by pain Patient left: with bed alarm set;with call bell/phone within reach;with family/visitor present   PT Visit Diagnosis: Muscle weakness (generalized) (M62.81);Pain;Difficulty in walking, not elsewhere classified (R26.2) Pain - Right/Left: Right Pain - part of body: Ankle and joints of foot    Time: 1343-1406 PT Time Calculation (min) (ACUTE ONLY): 23 min   Charges:   PT Evaluation $PT Eval Low Complexity: 1 Low PT Treatments $Therapeutic Activity: 8-22 mins        Kreg Shropshire, DPT 10/16/2017, 3:10 PM

## 2017-10-16 NOTE — ED Triage Notes (Signed)
Pt states no bm the past 2 days, daughter states pt has been filling the bsc with blood, pt appears pale in triage, pt does not think the bleeding is rectal.

## 2017-10-16 NOTE — ED Notes (Signed)
Type & screen hemolyzed x2. Per EDP, no need for this RN to recollect at this time.   Coag tube "very slightly hemolyzed." This RN attempted to recollect. Unsuccessful. Lab called to come collect.

## 2017-10-16 NOTE — ED Provider Notes (Addendum)
[facility] Emergency Department Provider Note   ____________________________________________   First MD Initiated Contact with Patient 10/16/17 [phone]     (approximate)  I have reviewed the triage vital signs and the nursing notes.   HISTORY  Chief Complaint Hematuria   HPI [name] is a 77 y.o. female who reports a lots of blood in the toilet.  She thinks is from her urine but she is not sure.  Planes of gouty pain in her right foot and beginning in the left foot as well.  Has a history of gout.  She takes Coumadin.  Past Medical History:  Diagnosis Date  . Anxiety   . Aortic insufficiency    Trivial  . Arthritis   . Asthma   . Bronchitis   . Bursitis, hip   . Claustrophobia   . Claustrophobia   . Dysrhythmia    a fib  . GERD (gastroesophageal reflux disease)    history of  . Gout   . Hyperlipidemia   . Hypertension   . Hypothyroidism   . Mitral regurgitation   . Obesity   . Panic attacks   . Paroxysmal atrial fibrillation (HCC)   . PTSD (post-traumatic stress disorder)   . Rhinitis   . Sick sinus syndrome ([place])   . Sleep apnea    was ordered CPAP, but never fitted for one years ago, Dr has retired    Patient Active Problem List   Diagnosis Date Noted  . Encounter for therapeutic drug monitoring 04/19/2017  . Disorder of bone, unspecified 03/07/2017  . Other specified health status 03/07/2017  . CKD (chronic kidney disease) stage 3, GFR 30-59 ml/min (HCC) 05/11/2016  . Anemia of chronic disease 05/11/2016  . Hypocalcemia 05/11/2016  . Chronic pain syndrome 02/14/2016  . Status post total replacement of left hip 02/06/2016  . S/P total hip arthroplasty 12/22/2015  . Hypothyroidism (acquired) 12/01/2015  . Long term (current) use of opiate analgesic 12/01/2015  . Morbid obesity with BMI of 40.0-44.9, adult ([place]) 12/01/2015  . PTSD (post-traumatic stress disorder) 12/01/2015  . Spinal stenosis of lumbar region 12/01/2015  .  Opioid-induced constipation (OIC) 09/30/2015  . Avascular necrosis of left femoral head ([place]) 08/17/2015  . Chronic hip pain (Location of Primary Source of Pain) (Left) 08/11/2015  . Long term current use of opiate analgesic 08/11/2015  . Long term prescription opiate use 08/11/2015  . Opiate use (54 MME/Day) 08/11/2015  . Encounter for therapeutic drug level monitoring 08/11/2015  . Disturbance of skin sensation 08/11/2015  . Osteoarthritis of hip (Left) 08/11/2015  . Trochanteric bursitis of hip (Left) 08/11/2015  . Lumbar central spinal stenosis (L4-5) 08/11/2015  . Lumbar facet arthropathy ([place]) 08/11/2015  . 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 Atrial fibrillation ([place]) 02/16/2015  . GERD (gastroesophageal reflux disease) 10/27/2010  . Cough   . Cardiac pacemaker in situ 06/05/2008  . HYPOTHYROIDISM 06/04/2008  . HYPERLIPIDEMIA 06/04/2008  . OBESITY 06/04/2008  . Essential hypertension 06/04/2008  . SICK SINUS SYNDROME 06/04/2008  . Sick sinus syndrome ([place]) 06/04/2008    Past Surgical History:  Procedure Laterality Date  . DILATATION & CURETTAGE/HYSTEROSCOPY WITH MYOSURE N/A 08/03/2015   Procedure: DILATATION & CURETTAGE/HYSTEROSCOPY WITH MYOSURE;  Surgeon: Alden Hipp, MD;  Location: [place] [facility];  Service: Gynecology;  Laterality: N/A;  Patient has an ICD   . INSERT / REPLACE / REMOVE PACEMAKER    . PACEMAKER INSERTION     Status post insertion  .  ROTATOR CUFF REPAIR     left  . Status post child birth x1    . TONSILLECTOMY    . TOTAL HIP ARTHROPLASTY Left 12/22/2015   Procedure: TOTAL HIP ARTHROPLASTY;  Surgeon: Dereck Leep, MD;  Location: [facility];  Service: Orthopedics;  Laterality: Left;    Prior to Admission medications   Medication Sig Start Date End Date Taking? Authorizing Provider  allopurinol (ZYLOPRIM) 100 MG tablet Take 1 tablet by mouth 2 (two) times daily. 10/15/17  Yes [provider]    colchicine 0.6 MG tablet Take 0.6 mg by mouth 3 (three) times a week.    Yes [provider]  diazepam (VALIUM) 10 MG tablet Take 10 mg by mouth 2 (two) times daily as needed for anxiety.   Yes [provider]  liothyronine (CYTOMEL) 5 MCG tablet Take 5 mcg by mouth daily.   Yes [provider]  metoprolol tartrate (LOPRESSOR) 50 MG tablet Take 1.5 tablets (75 mg total) by mouth 2 (two) times daily. 08/03/17 11/01/17 Yes Daune Perch, NP  oxyCODONE (OXY IR/ROXICODONE) 5 MG immediate release tablet Take 1 tablet (5 mg total) by mouth 5 (five) times daily as needed for severe pain. 11/02/17 12/02/17 Yes King, Diona Foley, NP  triamterene-hydrochlorothiazide (MAXZIDE) 75-50 MG tablet Take 1 tablet by mouth daily.   Yes [provider]  warfarin (COUMADIN) 2.5 MG tablet TAKE 1 TABLET DAILY Patient taking differently: Take 2.5-5 mg by mouth daily. SUNDAY,MONDAY,WEDNESDAY,THURSDAY,FRIDAY-2.5MG  TUESDAY,SATURDAY-5MG  06/25/17  Yes Fay Records, MD  oxyCODONE (OXY IR/ROXICODONE) 5 MG immediate release tablet Take 1 tablet (5 mg total) by mouth 5 (five) times daily. Patient not taking: Reported on 10/16/2017 10/03/17 11/02/17  Vevelyn Francois, NP  thyroid (ARMOUR) 30 MG tablet Take 30 mg by mouth daily before breakfast.    [provider]    Allergies Contrast media [iodinated diagnostic agents]  Family History  Problem Relation Age of Onset  . Stroke Mother   . Heart disease Mother   . Alzheimer's disease Father   . Hypertension Sister   . Cancer Brother   . Arthritis Sister   . Cancer Maternal Grandfather     Social History Social History   Tobacco Use  . Smoking status: Never Smoker  . Smokeless tobacco: Never Used  Substance Use Topics  . Alcohol use: No    Alcohol/week: 0.0 standard drinks  . Drug use: No    Review of Systems  Constitutional: No fever/chills Eyes: No visual changes. ENT: No sore throat. Cardiovascular: Denies chest  pain. Respiratory: Denies shortness of breath. Gastrointestinal: No abdominal pain.  No nausea, no vomiting.  No diarrhea.  No constipation. Genitourinary: Negative for dysuria. Musculoskeletal: Negative for back pain. Skin: Negative for rash just the redness on the feet Neurological: Negative for headaches, focal weakness    ____________________________________________   PHYSICAL EXAM:  VITAL SIGNS: ED Triage Vitals  Enc Vitals Group     BP 10/16/17 0707 112/71     Pulse Rate 10/16/17 0706 90     Resp 10/16/17 0706 16     Temp 10/16/17 0706 (!) 97.4 F (36.3 C)     Temp Source 10/16/17 0706 Oral     SpO2 10/16/17 0706 97 %     Weight 10/16/17 0707 214 lb (97.1 kg)     Height 10/16/17 0707 5\' 1"  (1.549 m)     Head Circumference --      Peak Flow --      Pain Score  10/16/17 0707 4     Pain Loc --      Pain Edu? --      Excl. in [place]? --     Constitutional: Alert and oriented. Well appearing and in no acute distress. Eyes: Conjunctivae are normal. I. Head: Atraumatic. Nose: No congestion/rhinnorhea. Mouth/Throat: Mucous membranes are moist.  Oropharynx non-erythematous. Neck: No stridor.  Cardiovascular: Normal rate, regular rhythm. Grossly normal heart sounds.  Good peripheral circulation. Respiratory: Normal respiratory effort.  No retractions. Lungs CTAB. Gastrointestinal: Soft and nontender. No distention. No abdominal bruits. No CVA tenderness. Musculoskeletal: No lower extremity tenderness nor edema.  There is redness tenderness and swelling of the right foot and ankle and is beginning slightly in the left foot she says this is consistent with her gout. Neurologic:  Normal speech and language. No gross focal neurologic deficits are appreciated.  Skin:  Skin is warm, dry and intact. No rash noted. Psychiatric: Mood and affect are normal. Speech and behavior are normal. Rectal: No masses no hemorrhoids no blood      ____________________________________________    LABS (all labs ordered are listed, but only abnormal results are displayed)  Labs Reviewed  COMPREHENSIVE METABOLIC PANEL - Abnormal; Notable for the following components:      Result Value   Potassium 3.4 (*)    Chloride 97 (*)    Glucose, Bld 118 (*)    BUN 31 (*)    Creatinine, Ser 1.18 (*)    Calcium 8.8 (*)    GFR calc non Af Amer 43 (*)    GFR calc Af Amer 50 (*)    All other components within normal limits  PROTIME-INR - Abnormal; Notable for the following components:   Prothrombin Time 59.0 (*)    INR 6.86 (*)    All other components within normal limits  APTT - Abnormal; Notable for the following components:   aPTT 127 (*)    All other components within normal limits  CBC WITH DIFFERENTIAL/PLATELET  URIC ACID  URINALYSIS, COMPLETE (UACMP) WITH MICROSCOPIC  TYPE AND SCREEN   ____________________________________________  EKG   ____________________________________________  RADIOLOGY  ED MD interpretation:  Official radiology report(s): Dg Foot Complete Right  Result Date: 10/16/2017 CLINICAL DATA:  History of gout. Pain and bruising and visible swelling greatest over the lateral malleolus. EXAM: RIGHT FOOT COMPLETE - 3+ VIEW COMPARISON:  None in PACs FINDINGS: The bones are subjectively adequately mineralized. The interphalangeal and second through fifth MTP joint spaces are well maintained. There is mild narrowing of the first MTP joint. There is a moderate hallux valgus contour of the first ray. The metatarsals are intact as are the tarsal bones. There is a plantar calcaneal spur. No erosive changes are observed.There is diffuse soft tissue swelling especially about the ankle and dorsum of the midfoot. There are no abnormal soft tissue calcifications. IMPRESSION: There is no acute bony abnormality of the right foot. There is mild degenerative change of the first MTP joint with hallux valgus contour. There is diffuse soft tissue swelling. Electronically Signed   By:  David  [place] M.D.   On: 10/16/2017 07:47    ____________________________________________   PROCEDURES  Procedure(s) performed:   Procedures  Critical Care performed:   ____________________________________________   INITIAL IMPRESSION / ASSESSMENT AND PLAN / ED COURSE     We will give the patient 5 IV vitamin K and plan on observing them in the hospital briefly to make sure the hematuria stops no other bleeding starts.  ____________________________________________   FINAL CLINICAL IMPRESSION(S) / ED DIAGNOSES  Final diagnoses:  Hematuria, unspecified type  Elevated INR     ED Discharge Orders    None       Note:  This document was prepared using Dragon voice recognition software and may include unintentional dictation errors.    Nena Polio, MD 10/16/17 1121    Nena Polio, MD 10/16/17 1122

## 2017-10-16 NOTE — H&P (Signed)
[facility]    MR#:  [id number]  DATE OF BIRTH:  1940-09-29  DATE OF ADMISSION:  10/16/2017  PRIMARY CARE PHYSICIAN: Bryson Corona, NP   REQUESTING/REFERRING PHYSICIAN: Dr. Rip Harbour  CHIEF COMPLAINT:   Chief Complaint  Patient presents with  . Hematuria    HISTORY OF PRESENT ILLNESS:  [name]  is a 77 y.o. female with a known history of atrial fibrillation on Coumadin, hypothyroidism, hypertension, recurrent gout presents to emergency room with worsening hematuria.  Patient's INR is 6.5.  No change in Coumadin dose or diet or other medications.  She is taking colchicine for her gout attack for 2 days.  Right ankle gout attack.  Difficulty walking.  No falls.  No fever.  No blood in stool.  Hemoglobin stable at 13.7. She has noticed frank blood the last 2 times of large quantity with minimal urine.  PAST MEDICAL HISTORY:   Past Medical History:  Diagnosis Date  . Anxiety   . Aortic insufficiency    Trivial  . Arthritis   . Asthma   . Bronchitis   . Bursitis, hip   . Claustrophobia   . Claustrophobia   . Dysrhythmia    a fib  . GERD (gastroesophageal reflux disease)    history of  . Gout   . Hyperlipidemia   . Hypertension   . Hypothyroidism   . Mitral regurgitation   . Obesity   . Panic attacks   . Paroxysmal atrial fibrillation (HCC)   . PTSD (post-traumatic stress disorder)   . Rhinitis   . Sick sinus syndrome ([place])   . Sleep apnea    was ordered CPAP, but never fitted for one years ago, Dr has retired    PAST SURGICAL HISTORY:   Past Surgical History:  Procedure Laterality Date  . DILATATION & CURETTAGE/HYSTEROSCOPY WITH MYOSURE N/A 08/03/2015   Procedure: DILATATION & CURETTAGE/HYSTEROSCOPY WITH MYOSURE;  Surgeon: Alden Hipp, MD;  Location: [facility];  Service: Gynecology;  Laterality: N/A;  Patient has an ICD   . INSERT / REPLACE / REMOVE PACEMAKER    . PACEMAKER INSERTION      Status post insertion  . ROTATOR CUFF REPAIR     left  . Status post child birth x1    . TONSILLECTOMY    . TOTAL HIP ARTHROPLASTY Left 12/22/2015   Procedure: TOTAL HIP ARTHROPLASTY;  Surgeon: Dereck Leep, MD;  Location: [facility];  Service: Orthopedics;  Laterality: Left;    SOCIAL HISTORY:   Social History   Tobacco Use  . Smoking status: Never Smoker  . Smokeless tobacco: Never Used  Substance Use Topics  . Alcohol use: No    Alcohol/week: 0.0 standard drinks    FAMILY HISTORY:   Family History  Problem Relation Age of Onset  . Stroke Mother   . Heart disease Mother   . Alzheimer's disease Father   . Hypertension Sister   . Cancer Brother   . Arthritis Sister   . Cancer Maternal Grandfather     DRUG ALLERGIES:   Allergies  Allergen Reactions  . Contrast Media [Iodinated Diagnostic Agents] Rash    Contrast dye causes swelling    REVIEW OF SYSTEMS:   Review of Systems  Constitutional: Positive for malaise/fatigue. Negative for chills and fever.  HENT: Negative for sore throat.   Eyes: Negative for blurred vision, double vision and pain.  Respiratory: Negative for cough, hemoptysis, shortness of  breath and wheezing.   Cardiovascular: Negative for chest pain, palpitations, orthopnea and leg swelling.  Gastrointestinal: Negative for abdominal pain, constipation, diarrhea, heartburn, nausea and vomiting.  Genitourinary: Positive for hematuria. Negative for dysuria.  Musculoskeletal: Positive for joint pain. Negative for back pain.  Skin: Negative for rash.  Neurological: Negative for sensory change, speech change, focal weakness and headaches.  Endo/Heme/Allergies: Does not bruise/bleed easily.  Psychiatric/Behavioral: Negative for depression. The patient is not nervous/anxious.     MEDICATIONS AT HOME:   Prior to Admission medications   Medication Sig Start Date End Date Taking? Authorizing Provider  allopurinol (ZYLOPRIM) 100 MG tablet Take 1 tablet  by mouth 2 (two) times daily. 10/15/17  Yes [provider]  colchicine 0.6 MG tablet Take 0.6 mg by mouth 3 (three) times a week.    Yes [provider]  diazepam (VALIUM) 10 MG tablet Take 10 mg by mouth 2 (two) times daily as needed for anxiety.   Yes [provider]  liothyronine (CYTOMEL) 5 MCG tablet Take 5 mcg by mouth daily.   Yes [provider]  metoprolol tartrate (LOPRESSOR) 50 MG tablet Take 1.5 tablets (75 mg total) by mouth 2 (two) times daily. 08/03/17 11/01/17 Yes Daune Perch, NP  oxyCODONE (OXY IR/ROXICODONE) 5 MG immediate release tablet Take 1 tablet (5 mg total) by mouth 5 (five) times daily as needed for severe pain. 11/02/17 12/02/17 Yes King, Diona Foley, NP  triamterene-hydrochlorothiazide (MAXZIDE) 75-50 MG tablet Take 1 tablet by mouth daily.   Yes [provider]  warfarin (COUMADIN) 2.5 MG tablet TAKE 1 TABLET DAILY Patient taking differently: Take 2.5-5 mg by mouth daily. SUNDAY,MONDAY,WEDNESDAY,THURSDAY,FRIDAY-2.5MG  TUESDAY,SATURDAY-5MG  06/25/17  Yes Fay Records, MD  oxyCODONE (OXY IR/ROXICODONE) 5 MG immediate release tablet Take 1 tablet (5 mg total) by mouth 5 (five) times daily. Patient not taking: Reported on 10/16/2017 10/03/17 11/02/17  Vevelyn Francois, NP  thyroid (ARMOUR) 30 MG tablet Take 30 mg by mouth daily before breakfast.    [provider]     VITAL SIGNS:  Blood pressure (!) 115/54, pulse 77, temperature (!) 97.4 F (36.3 C), temperature source Oral, resp. rate 16, height 5\' 1"  (1.549 m), weight 97.1 kg, SpO2 97 %.  PHYSICAL EXAMINATION:  Physical Exam  GENERAL:  77 y.o.-year-old patient lying in the bed with no acute distress.  EYES: Pupils equal, round, reactive to light and accommodation. No scleral icterus. Extraocular muscles intact.  HEENT: Head atraumatic, normocephalic. Oropharynx and nasopharynx clear. No oropharyngeal erythema, moist oral mucosa  NECK:  Supple, no jugular venous distention.  No thyroid enlargement, no tenderness.  LUNGS: Normal breath sounds bilaterally, no wheezing, rales, rhonchi. No use of accessory muscles of respiration.  CARDIOVASCULAR: S1, S2 normal. No murmurs, rubs, or gallops.  ABDOMEN: Soft, nontender, nondistended. Bowel sounds present. No organomegaly or mass.  EXTREMITIES: Swollen right ankle, warm and tender. NEUROLOGIC: Cranial nerves II through XII are intact. No focal Motor or sensory deficits appreciated b/l PSYCHIATRIC: The patient is alert and oriented x 3. Good affect.  SKIN: No obvious rash, lesion, or ulcer.   LABORATORY PANEL:   CBC Recent Labs  Lab 10/16/17 0747  WBC 7.6  HGB 13.7  HCT 38.7  PLT 188   ------------------------------------------------------------------------------------------------------------------  Chemistries  Recent Labs  Lab 10/16/17 0747  NA 136  K 3.4*  CL 97*  CO2 29  GLUCOSE 118*  BUN 31*  CREATININE 1.18*  CALCIUM 8.8*  AST 26  ALT 10  ALKPHOS 51  BILITOT 0.9   ------------------------------------------------------------------------------------------------------------------  Cardiac Enzymes No results for input(s): TROPONINI in the last 168 hours. ------------------------------------------------------------------------------------------------------------------  RADIOLOGY:  Dg Foot Complete Right  Result Date: 10/16/2017 CLINICAL DATA:  History of gout. Pain and bruising and visible swelling greatest over the lateral malleolus. EXAM: RIGHT FOOT COMPLETE - 3+ VIEW COMPARISON:  None in PACs FINDINGS: The bones are subjectively adequately mineralized. The interphalangeal and second through fifth MTP joint spaces are well maintained. There is mild narrowing of the first MTP joint. There is a moderate hallux valgus contour of the first ray. The metatarsals are intact as are the tarsal bones. There is a plantar calcaneal spur. No erosive changes are observed.There is diffuse soft tissue swelling  especially about the ankle and dorsum of the midfoot. There are no abnormal soft tissue calcifications. IMPRESSION: There is no acute bony abnormality of the right foot. There is mild degenerative change of the first MTP joint with hallux valgus contour. There is diffuse soft tissue swelling. Electronically Signed   By: David  Martinique M.D.   On: 10/16/2017 07:47     IMPRESSION AND PLAN:   *Hematuria.  Likely due to supratherapeutic INR.  No dysuria or pain. Worsening today with frank blood.  Hemoglobin is stable.  Patient has been given vitamin K.  Hold Coumadin.  Admit under observation.  Repeat hemoglobin today evening and tomorrow morning.  Hopefully will resolve as INR trends down.  *Atrial fibrillation on Coumadin.  Vitamin K given for supratherapeutic INR.  It is unclear why her INR level is elevated as there is no change in medications or diet.  Repeat INR in the morning.  Hold Coumadin.  *Acute gout of right ankle.  Patient with typical symptoms which are in recurrent for her.  On colchicine at home which will be continued.  *Hypertension.  Continue home medications.  *Hypothyroidism.  On home medications.  DVT prophylaxis.  INR at 6.  Likely discharge tomorrow  All the records are reviewed and case discussed with ED provider. Management plans discussed with the patient, family and they are in agreement.  CODE STATUS: FULL CODE  TOTAL TIME TAKING CARE OF THIS PATIENT: 40 minutes.   Leia Alf Arvie Villarruel M.D on 10/16/2017 at 11:56 AM  Between 7am to 6pm - Pager - [phone]  After 6pm go to [email or web address] - password [email or web address] [facility]  Office  [phone]  CC: Primary care physician; Bryson Corona, NP  Note: This dictation was prepared with Dragon dictation along with smaller phrase technology. Any transcriptional errors that result from this process are unintentional.

## 2017-10-17 ENCOUNTER — Observation Stay: Payer: Medicare Other

## 2017-10-17 DIAGNOSIS — Z79890 Hormone replacement therapy (postmenopausal): Secondary | ICD-10-CM | POA: Diagnosis not present

## 2017-10-17 DIAGNOSIS — F419 Anxiety disorder, unspecified: Secondary | ICD-10-CM | POA: Diagnosis present

## 2017-10-17 DIAGNOSIS — R319 Hematuria, unspecified: Secondary | ICD-10-CM | POA: Diagnosis present

## 2017-10-17 DIAGNOSIS — I080 Rheumatic disorders of both mitral and aortic valves: Secondary | ICD-10-CM | POA: Diagnosis present

## 2017-10-17 DIAGNOSIS — E039 Hypothyroidism, unspecified: Secondary | ICD-10-CM | POA: Diagnosis present

## 2017-10-17 DIAGNOSIS — Z91041 Radiographic dye allergy status: Secondary | ICD-10-CM | POA: Diagnosis not present

## 2017-10-17 DIAGNOSIS — Z8249 Family history of ischemic heart disease and other diseases of the circulatory system: Secondary | ICD-10-CM | POA: Diagnosis not present

## 2017-10-17 DIAGNOSIS — Z7901 Long term (current) use of anticoagulants: Secondary | ICD-10-CM | POA: Diagnosis not present

## 2017-10-17 DIAGNOSIS — R310 Gross hematuria: Secondary | ICD-10-CM | POA: Diagnosis not present

## 2017-10-17 DIAGNOSIS — I10 Essential (primary) hypertension: Secondary | ICD-10-CM | POA: Diagnosis present

## 2017-10-17 DIAGNOSIS — N390 Urinary tract infection, site not specified: Secondary | ICD-10-CM | POA: Diagnosis present

## 2017-10-17 DIAGNOSIS — F40240 Claustrophobia: Secondary | ICD-10-CM | POA: Diagnosis present

## 2017-10-17 DIAGNOSIS — R791 Abnormal coagulation profile: Secondary | ICD-10-CM | POA: Diagnosis present

## 2017-10-17 DIAGNOSIS — K219 Gastro-esophageal reflux disease without esophagitis: Secondary | ICD-10-CM | POA: Diagnosis present

## 2017-10-17 DIAGNOSIS — G4730 Sleep apnea, unspecified: Secondary | ICD-10-CM | POA: Diagnosis present

## 2017-10-17 DIAGNOSIS — F410 Panic disorder [episodic paroxysmal anxiety] without agoraphobia: Secondary | ICD-10-CM | POA: Diagnosis present

## 2017-10-17 DIAGNOSIS — I4891 Unspecified atrial fibrillation: Secondary | ICD-10-CM | POA: Diagnosis not present

## 2017-10-17 DIAGNOSIS — Z79899 Other long term (current) drug therapy: Secondary | ICD-10-CM | POA: Diagnosis not present

## 2017-10-17 DIAGNOSIS — M1990 Unspecified osteoarthritis, unspecified site: Secondary | ICD-10-CM | POA: Diagnosis present

## 2017-10-17 DIAGNOSIS — M109 Gout, unspecified: Secondary | ICD-10-CM | POA: Diagnosis present

## 2017-10-17 DIAGNOSIS — J45909 Unspecified asthma, uncomplicated: Secondary | ICD-10-CM | POA: Diagnosis present

## 2017-10-17 DIAGNOSIS — E785 Hyperlipidemia, unspecified: Secondary | ICD-10-CM | POA: Diagnosis present

## 2017-10-17 DIAGNOSIS — I480 Paroxysmal atrial fibrillation: Secondary | ICD-10-CM | POA: Diagnosis present

## 2017-10-17 DIAGNOSIS — F4310 Post-traumatic stress disorder, unspecified: Secondary | ICD-10-CM | POA: Diagnosis present

## 2017-10-17 DIAGNOSIS — I495 Sick sinus syndrome: Secondary | ICD-10-CM | POA: Diagnosis present

## 2017-10-17 DIAGNOSIS — Z6841 Body Mass Index (BMI) 40.0 and over, adult: Secondary | ICD-10-CM | POA: Diagnosis not present

## 2017-10-17 LAB — BASIC METABOLIC PANEL
Anion gap: 7 (ref 5–15)
BUN: 24 mg/dL — ABNORMAL HIGH (ref 8–23)
CHLORIDE: 101 mmol/L (ref 98–111)
CO2: 30 mmol/L (ref 22–32)
CREATININE: 1.1 mg/dL — AB (ref 0.44–1.00)
Calcium: 8.4 mg/dL — ABNORMAL LOW (ref 8.9–10.3)
GFR calc non Af Amer: 47 mL/min — ABNORMAL LOW (ref >60)
GFR, EST AFRICAN AMERICAN: 55 mL/min — AB (ref >60)
Glucose, Bld: 105 mg/dL — ABNORMAL HIGH (ref 70–99)
POTASSIUM: 3.7 mmol/L (ref 3.5–5.1)
Sodium: 138 mmol/L (ref 135–145)

## 2017-10-17 LAB — URINALYSIS, COMPLETE (UACMP) WITH MICROSCOPIC
BILIRUBIN URINE: NEGATIVE
Glucose, UA: NEGATIVE mg/dL
KETONES UR: NEGATIVE mg/dL
NITRITE: NEGATIVE
PROTEIN: NEGATIVE mg/dL
SPECIFIC GRAVITY, URINE: 1.011 (ref 1.005–1.030)
pH: 7 (ref 5.0–8.0)

## 2017-10-17 LAB — CBC
HEMATOCRIT: 34.5 % — AB (ref 35.0–47.0)
Hemoglobin: 12.1 g/dL (ref 12.0–16.0)
MCH: 32.3 pg (ref 26.0–34.0)
MCHC: 35.1 g/dL (ref 32.0–36.0)
MCV: 92 fL (ref 80.0–100.0)
PLATELETS: 193 10*3/uL (ref 150–440)
RBC: 3.75 MIL/uL — AB (ref 3.80–5.20)
RDW: 14.2 % (ref 11.5–14.5)
WBC: 5.7 10*3/uL (ref 3.6–11.0)

## 2017-10-17 LAB — PROTIME-INR
INR: 1.57
Prothrombin Time: 18.6 seconds — ABNORMAL HIGH (ref 11.4–15.2)

## 2017-10-17 MED ORDER — SODIUM CHLORIDE 0.9 % IV SOLN
1.0000 g | INTRAVENOUS | Status: DC
  Administered 2017-10-17: 1 g via INTRAVENOUS
  Filled 2017-10-17: qty 10
  Filled 2017-10-17: qty 1

## 2017-10-17 MED ORDER — SODIUM CHLORIDE 0.9 % IV SOLN: INTRAVENOUS | Status: DC | PRN

## 2017-10-17 MED ORDER — ENSURE ENLIVE PO LIQD
237.0000 mL | Freq: Two times a day (BID) | ORAL | Status: DC
  Administered 2017-10-17: 237 mL via ORAL

## 2017-10-17 MED ORDER — ADULT MULTIVITAMIN W/MINERALS CH
1.0000 | ORAL_TABLET | Freq: Every day | ORAL | Status: DC
  Administered 2017-10-17 – 2017-10-18 (×2): 1 via ORAL
  Filled 2017-10-17 (×2): qty 1

## 2017-10-17 NOTE — Care Management Note (Signed)
Case Management Note  Patient Details  Name: [name] MRN: [id number] Date of Birth: 1940-06-24  Subjective/Objective:                 Placed in observation for hematuria and pain I right ankle due to gout.   Patient presented from home. She is on chronic coumadin and INR was 6.5. Hemoglobin stable.  Physical therapy has recommended skilled nursing placement. CM discussed that skilled nursing stay would not be covered under Medicare as she is in observation . She wishes to return home with home health.  has a walker. No issues with accessing medical care. Agency preference for home health is [facility].   Action/Plan: Heads up referral called to [facility] for RN PT and Aide    Expected Discharge Date:                  Expected Discharge Plan:     In-House Referral:     Discharge planning Services     Post Acute Care Choice:    Choice offered to:     DME Arranged:    DME Agency:     HH Arranged:    HH Agency:     Status of Service:     If discussed at [employer], dates discussed:    Additional Comments:  Katrina Stack, RN 10/17/2017, 1:31 PM

## 2017-10-17 NOTE — Progress Notes (Signed)
[place] [place] NAME: [name]    MR#:  [id number]  DATE OF BIRTH:  10-Feb-1941  SUBJECTIVE:    REVIEW OF SYSTEMS:   ROS Tolerating Diet: Tolerating PT:   DRUG ALLERGIES:   Allergies  Allergen Reactions  . Contrast Media [Iodinated Diagnostic Agents] Rash    Contrast dye causes swelling    VITALS:  Blood pressure (!) 112/52, pulse 73, temperature 98.1 F (36.7 C), temperature source Oral, resp. rate 17, height 5\' 1"  (1.549 m), weight 96.8 kg, SpO2 97 %.  PHYSICAL EXAMINATION:   Physical Exam  GENERAL:  77 y.o.-year-old patient lying in the bed with no acute distress.  EYES: Pupils equal, round, reactive to light and accommodation. No scleral icterus. Extraocular muscles intact.  HEENT: Head atraumatic, normocephalic. Oropharynx and nasopharynx clear.  NECK:  Supple, no jugular venous distention. No thyroid enlargement, no tenderness.  LUNGS: Normal breath sounds bilaterally, no wheezing, rales, rhonchi. No use of accessory muscles of respiration.  CARDIOVASCULAR: S1, S2 normal. No murmurs, rubs, or gallops.  ABDOMEN: Soft, nontender, nondistended. Bowel sounds present. No organomegaly or mass.  EXTREMITIES: No cyanosis, clubbing or edema b/l.    NEUROLOGIC: Cranial nerves II through XII are intact. No focal Motor or sensory deficits b/l.   PSYCHIATRIC:  patient is alert and oriented x 3.  SKIN: No obvious rash, lesion, or ulcer.   LABORATORY PANEL:  CBC Recent Labs  Lab 10/17/17 0403  WBC 5.7  HGB 12.1  HCT 34.5*  PLT 193    Chemistries  Recent Labs  Lab 10/16/17 0747 10/17/17 0403  NA 136 138  K 3.4* 3.7  CL 97* 101  CO2 29 30  GLUCOSE 118* 105*  BUN 31* 24*  CREATININE 1.18* 1.10*  CALCIUM 8.8* 8.4*  AST 26  --   ALT 10  --   ALKPHOS 51  --   BILITOT 0.9  --    Cardiac Enzymes No results for input(s): TROPONINI in the last 168 hours. RADIOLOGY:  US Renal  Result Date:  10/17/2017 CLINICAL DATA:  Gross hematuria for 3 days EXAM: RENAL / URINARY TRACT ULTRASOUND COMPLETE COMPARISON:  None. FINDINGS: Right Kidney: Length: 9.5 cm. Echogenicity within normal limits. No mass or hydronephrosis visualized. Left Kidney: Length: 8.3 cm. Echogenicity within normal limits. No mass or hydronephrosis visualized. Bladder: Questionable trabeculation of the moderately distended bladder. No focal or definite wall thickening. IMPRESSION: 1. No hydronephrosis. 2. Renal atrophy. 3. Moderately distended urinary bladder with possible trabeculation. Electronically Signed   By: Monte Fantasia M.D.   On: 10/17/2017 14:59   Dg Foot Complete Right  Result Date: 10/16/2017 CLINICAL DATA:  History of gout. Pain and bruising and visible swelling greatest over the lateral malleolus. EXAM: RIGHT FOOT COMPLETE - 3+ VIEW COMPARISON:  None in PACs FINDINGS: The bones are subjectively adequately mineralized. The interphalangeal and second through fifth MTP joint spaces are well maintained. There is mild narrowing of the first MTP joint. There is a moderate hallux valgus contour of the first ray. The metatarsals are intact as are the tarsal bones. There is a plantar calcaneal spur. No erosive changes are observed.There is diffuse soft tissue swelling especially about the ankle and dorsum of the midfoot. There are no abnormal soft tissue calcifications. IMPRESSION: There is no acute bony abnormality of the right foot. There is mild degenerative change of the first MTP joint with hallux valgus contour. There is diffuse soft tissue swelling. Electronically  Signed   By: David  Martinique M.D.   On: 10/16/2017 07:47   ASSESSMENT AND PLAN:  [name]  is a 77 y.o. female with a known history of atrial fibrillation on Coumadin, hypothyroidism, hypertension, recurrent gout presents to emergency room with worsening hematuria.  Patient's INR is 6.5.  No change in Coumadin dose or diet or other medications.  She is  taking colchicine for her gout attack for 2 days.  Right ankle gout attack.  Difficulty walking.  No falls.  *Hematuria.  Likely due to supratherapeutic INR and possible UTI with abnormal UA -No dysuria or pain. -Worsening today with frank blood.  Hemoglobin is stable.  Patient has been given vitamin K.  Hold Coumadin. - Repeat hemoglobin stable -  Hopefully will resolve as INR trends down. -empiric IV abxs--will d/c abxs once urine clears completely -renal USG looks ok -urine clearing now.  *Atrial fibrillation on Coumadin.  Vitamin K given for supratherapeutic INR.  It is unclear why her INR level is elevated as there is no change in medications or diet.  Repeat INR 1.57   Hold Coumadin.  *Acute gout of right ankle.  Patient with typical symptoms which are in recurrent for her.  On colchicine at home which will be continued.  *Hypertension.  Continue home medications.  *Hypothyroidism.  On home medications.  DVT prophylaxis.  INR 1.57 Case discussed with Care Management/Social Worker. Management plans discussed with the patient, family and they are in agreement.  CODE STATUS: full  DVT Prophylaxis: SCD  TOTAL TIME TAKING CARE OF THIS PATIENT: 30 minutes.  >50% time spent on counselling and coordination of care  POSSIBLE D/C IN *1-2* DAYS, DEPENDING ON CLINICAL CONDITION.  Note: This dictation was prepared with Dragon dictation along with smaller phrase technology. Any transcriptional errors that result from this process are unintentional.  [name] M.D on 10/17/2017 at 3:23 PM  Between 7am to 6pm - Pager - [phone]  After 6pm go to [email or web address] - password [email or web address] [facility]  Office  [phone]  CC: Primary care physician; Bryson Corona, NPPatient ID: [name], female   DOB: 1940/10/03, 77 y.o.   MRN: [id number]

## 2017-10-17 NOTE — Progress Notes (Signed)
PT Cancellation Note  Patient Details Name: [name] MRN: [id number] DOB: 28-May-1940   Cancelled Treatment:    Reason Eval/Treat Not Completed: Patient declined, no reason specified. Treatment attempted; pt refuses noting continued significant pain in R foot with inability to bear weight. Pt also notes a new onset of R mid back pain (with continued hematuria); pt notes communicating this to the nursing staff. Re attempt at a later date, as the pt able.    Larae Grooms, PTA 10/17/2017, 11:30 AM

## 2017-10-17 NOTE — Progress Notes (Signed)
Nutrition Brief Note  Patient identified on the Malnutrition Screening Tool (MST) Report  77 y/o female with h/o CKD III, gout admitted with hematuria secondary to elevated INR  Met with pt in room today. Pt reports good appetite and oral intake pta and today. Pt ate 100% of breakfast this morning. Pt reports drinking Ensure at home. RD will order for here. Per chart, pt is weight stable.   Wt Readings from Last 15 Encounters:  10/17/17 96.8 kg  08/28/17 94.3 kg  08/03/17 97.1 kg  05/29/17 95.7 kg  03/12/17 98.4 kg  03/07/17 94.8 kg  12/05/16 94.8 kg  08/24/16 93 kg  08/18/16 96.6 kg  05/11/16 94.3 kg  02/24/16 98.8 kg  02/15/16 96.2 kg  12/23/15 99.5 kg  12/01/15 98.9 kg  10/27/15 98.9 kg    Body mass index is 40.32 kg/m. Patient meets criteria for obesity based on current BMI. Per chart, pt is weight stable  Current diet order is 2g sodium, patient is consuming approximately 100% of meals at this time. Labs and medications reviewed.   No nutrition interventions warranted at this time. If nutrition issues arise, please consult RD.     MS, RD, LDN Pager #- [phone] Office#- [phone] After Hours Pager: [phone]   

## 2017-10-18 LAB — PROTIME-INR
INR: 1.36
Prothrombin Time: 16.7 seconds — ABNORMAL HIGH (ref 11.4–15.2)

## 2017-10-18 MED ORDER — WARFARIN SODIUM 2.5 MG PO TABS
2.5000 mg | ORAL_TABLET | Freq: Every day | ORAL | Status: DC
  Administered 2017-10-18: 2.5 mg via ORAL
  Filled 2017-10-18: qty 1

## 2017-10-18 MED ORDER — WARFARIN - PHYSICIAN DOSING INPATIENT: Freq: Every day | Status: DC

## 2017-10-18 NOTE — Clinical Social Work Note (Signed)
CSW informed by nurse and physician today that even though PT had recommended short term rehab 2 days ago, patient states her ability to walk has much improved and wishes to return home with home health. Shela Leff MsW,LCSW [phone]

## 2017-10-18 NOTE — Discharge Summary (Signed)
[facility] NAME: [name]    MR#:  [id number]  DATE OF BIRTH:  07-Sep-1940  DATE OF ADMISSION:  10/16/2017 ADMITTING PHYSICIAN: Hillary Bow, MD  DATE OF DISCHARGE:  10/18/2017  PRIMARY CARE PHYSICIAN: Bryson Corona, NP    ADMISSION DIAGNOSIS:  Elevated INR [R79.1] Hematuria, unspecified type [R31.9]  DISCHARGE DIAGNOSIS:  acute hematuria suspected due to supra-therapeutic INR now resolved   SECONDARY DIAGNOSIS:   Past Medical History:  Diagnosis Date  . Anxiety   . Aortic insufficiency    Trivial  . Arthritis   . Asthma   . Bronchitis   . Bursitis, hip   . Claustrophobia   . Claustrophobia   . Dysrhythmia    a fib  . GERD (gastroesophageal reflux disease)    history of  . Gout   . Hyperlipidemia   . Hypertension   . Hypothyroidism   . Mitral regurgitation   . Obesity   . Panic attacks   . Paroxysmal atrial fibrillation (HCC)   . PTSD (post-traumatic stress disorder)   . Rhinitis   . Sick sinus syndrome ([place])   . Sleep apnea    was ordered CPAP, but never fitted for one years ago, Dr has retired    HOSPITAL COURSE:   ClariceBurnsis a76 y.o.femalewith a known history of atrial fibrillation on Coumadin, hypothyroidism, hypertension, recurrent gout presents to emergency room with worsening hematuria. Patient's INR is 6.5. No change in Coumadin dose or diet or other medications. She is taking colchicine for her gout attack for 2 days. Right ankle gout attack. Difficulty walking. No falls.  *Hematuria. Likely due to supratherapeutic INR and possible UTI with abnormal UA -No dysuria or pain. -Worsening today with frank blood. Hemoglobin is stable. Patient has been given vitamin K.  -Repeat hemoglobin stable - Hopefully will resolve as INR trends down. -empiric IV abxs--will d/c abxs since urine cleared completely and pt does not have symptoms of UTI -renal USG looks  ok  *Atrial fibrillation on Coumadin. Vitamin K given for supratherapeutic INR. It is unclear why her INR level is elevated as there is no change in medications or diet---pt reports usually her dosage needs to be adjusted but she was not able to get her INR checked for > 6 weeks -INR today 1.36--will resume her home regimen and have her see the coumadin clinic next week  *Acute gout of right ankle. Patient with typical symptoms which are in recurrent for her. - On colchicine at home   *Hypertension. Continue home medications.  *Hypothyroidism. On home medications.  DVT prophylaxis. coumadin  Overall better D/c home--pt agreeable CONSULTS OBTAINED:    DRUG ALLERGIES:   Allergies  Allergen Reactions  . Contrast Media [Iodinated Diagnostic Agents] Rash    Contrast dye causes swelling    DISCHARGE MEDICATIONS:   Allergies as of 10/18/2017      Reactions   Contrast Media [iodinated Diagnostic Agents] Rash   Contrast dye causes swelling      Medication List    TAKE these medications   allopurinol 100 MG tablet Commonly known as:  ZYLOPRIM Take 1 tablet by mouth 2 (two) times daily.   colchicine 0.6 MG tablet Take 0.6 mg by mouth 3 (three) times a week.   diazepam 10 MG tablet Commonly known as:  VALIUM Take 10 mg by mouth 2 (two) times daily as needed for anxiety.   liothyronine 5 MCG tablet Commonly known as:  CYTOMEL  Take 5 mcg by mouth daily.   metoprolol tartrate 50 MG tablet Commonly known as:  LOPRESSOR Take 1.5 tablets (75 mg total) by mouth 2 (two) times daily.   oxyCODONE 5 MG immediate release tablet Commonly known as:  Oxy IR/ROXICODONE Take 1 tablet (5 mg total) by mouth 5 (five) times daily as needed for severe pain. Start taking on:  11/02/2017 What changed:  Another medication with the same name was removed. Continue taking this medication, and follow the directions you see here.   thyroid 30 MG tablet Commonly known as:  ARMOUR Take  30 mg by mouth daily before breakfast.   triamterene-hydrochlorothiazide 75-50 MG tablet Commonly known as:  MAXZIDE Take 1 tablet by mouth daily.   warfarin 2.5 MG tablet Commonly known as:  COUMADIN Take as directed. If you are unsure how to take this medication, talk to your nurse or doctor. Original instructions:  TAKE 1 TABLET DAILY What changed:    how much to take  additional instructions       If you experience worsening of your admission symptoms, develop shortness of breath, life threatening emergency, suicidal or homicidal thoughts you must seek medical attention immediately by calling 911 or calling your MD immediately  if symptoms less severe.  You Must read complete instructions/literature along with all the possible adverse reactions/side effects for all the Medicines you take and that have been prescribed to you. Take any new Medicines after you have completely understood and accept all the possible adverse reactions/side effects.   Please note  You were cared for by a hospitalist during your hospital stay. If you have any questions about your discharge medications or the care you received while you were in the hospital after you are discharged, you can call the unit and asked to speak with the hospitalist on call if the hospitalist that took care of you is not available. Once you are discharged, your primary care physician will handle any further medical issues. Please note that NO REFILLS for any discharge medications will be authorized once you are discharged, as it is imperative that you return to your primary care physician (or establish a relationship with a primary care physician if you do not have one) for your aftercare needs so that they can reassess your need for medications and monitor your lab values. Today   SUBJECTIVE   Doing well. No hematuria  VITAL SIGNS:  Blood pressure (!) 106/42, pulse 66, temperature 97.7 F (36.5 C), temperature source Oral,  resp. rate 18, height 5\' 1"  (1.549 m), weight 96.8 kg, SpO2 96 %.  I/O:    Intake/Output Summary (Last 24 hours) at 10/18/2017 1107 Last data filed at 10/18/2017 0900 Gross per 24 hour  Intake 495.31 ml  Output 1128 ml  Net -632.69 ml    PHYSICAL EXAMINATION:  GENERAL:  77 y.o.-year-old patient lying in the bed with no acute distress. obese EYES: Pupils equal, round, reactive to light and accommodation. No scleral icterus. Extraocular muscles intact.  HEENT: Head atraumatic, normocephalic. Oropharynx and nasopharynx clear.  NECK:  Supple, no jugular venous distention. No thyroid enlargement, no tenderness.  LUNGS: Normal breath sounds bilaterally, no wheezing, rales,rhonchi or crepitation. No use of accessory muscles of respiration.  CARDIOVASCULAR: S1, S2 normal. No murmurs, rubs, or gallops.  ABDOMEN: Soft, non-tender, non-distended. Bowel sounds present. No organomegaly or mass.  EXTREMITIES: No pedal edema, cyanosis, or clubbing. Right ankle swelling improving NEUROLOGIC: Cranial nerves II through XII are intact. Muscle strength 5/5 in  all extremities. Sensation intact. Gait not checked.  PSYCHIATRIC: The patient is alert and oriented x 3.  SKIN: No obvious rash, lesion, or ulcer.   DATA REVIEW:   CBC  Recent Labs  Lab 10/17/17 0403  WBC 5.7  HGB 12.1  HCT 34.5*  PLT 193    Chemistries  Recent Labs  Lab 10/16/17 0747 10/17/17 0403  NA 136 138  K 3.4* 3.7  CL 97* 101  CO2 29 30  GLUCOSE 118* 105*  BUN 31* 24*  CREATININE 1.18* 1.10*  CALCIUM 8.8* 8.4*  AST 26  --   ALT 10  --   ALKPHOS 51  --   BILITOT 0.9  --     Microbiology Results   No results found for this or any previous visit (from the past 240 hour(s)).  RADIOLOGY:  US Renal  Result Date: 10/17/2017 CLINICAL DATA:  Gross hematuria for 3 days EXAM: RENAL / URINARY TRACT ULTRASOUND COMPLETE COMPARISON:  None. FINDINGS: Right Kidney: Length: 9.5 cm. Echogenicity within normal limits. No mass or  hydronephrosis visualized. Left Kidney: Length: 8.3 cm. Echogenicity within normal limits. No mass or hydronephrosis visualized. Bladder: Questionable trabeculation of the moderately distended bladder. No focal or definite wall thickening. IMPRESSION: 1. No hydronephrosis. 2. Renal atrophy. 3. Moderately distended urinary bladder with possible trabeculation. Electronically Signed   By: Monte Fantasia M.D.   On: 10/17/2017 14:59     Management plans discussed with the patient, family and they are in agreement.  CODE STATUS:     Code Status Orders  (From admission, onward)         Start     Ordered   10/16/17 1154  Full code  Continuous     10/16/17 1154        Code Status History    Date Active Date Inactive Code Status Order ID Comments User Context   12/22/2015 1751 12/24/2015 1741 Full Code [id number]  Dereck Leep, MD Inpatient      TOTAL TIME TAKING CARE OF THIS PATIENT: 40 minutes.    [name] M.D on 10/18/2017 at 11:07 AM  Between 7am to 6pm - Pager - [phone] After 6pm go to [email or web address] - Proofreader  [facility]  Office  [phone]  CC: Primary care physician; Bryson Corona, NP

## 2017-10-18 NOTE — Progress Notes (Signed)
10/18/2017 2:09 PM  [name] to be D/C'd Home per MD order.  Discussed prescriptions and follow up appointments with the patient. Prescriptions given to patient, medication list explained in detail. Pt verbalized understanding.  Allergies as of 10/18/2017      Reactions   Contrast Media [iodinated Diagnostic Agents] Rash   Contrast dye causes swelling      Medication List    TAKE these medications   allopurinol 100 MG tablet Commonly known as:  ZYLOPRIM Take 1 tablet by mouth 2 (two) times daily.   colchicine 0.6 MG tablet Take 0.6 mg by mouth 3 (three) times a week.   diazepam 10 MG tablet Commonly known as:  VALIUM Take 10 mg by mouth 2 (two) times daily as needed for anxiety.   liothyronine 5 MCG tablet Commonly known as:  CYTOMEL Take 5 mcg by mouth daily.   metoprolol tartrate 50 MG tablet Commonly known as:  LOPRESSOR Take 1.5 tablets (75 mg total) by mouth 2 (two) times daily.   oxyCODONE 5 MG immediate release tablet Commonly known as:  Oxy IR/ROXICODONE Take 1 tablet (5 mg total) by mouth 5 (five) times daily as needed for severe pain. Start taking on:  11/02/2017 What changed:  Another medication with the same name was removed. Continue taking this medication, and follow the directions you see here.   thyroid 30 MG tablet Commonly known as:  ARMOUR Take 30 mg by mouth daily before breakfast.   triamterene-hydrochlorothiazide 75-50 MG tablet Commonly known as:  MAXZIDE Take 1 tablet by mouth daily.   warfarin 2.5 MG tablet Commonly known as:  COUMADIN Take as directed. If you are unsure how to take this medication, talk to your nurse or doctor. Original instructions:  TAKE 1 TABLET DAILY What changed:    how much to take  additional instructions       Vitals:   10/18/17 0515 10/18/17 1220  BP: (!) 106/42 (!) 106/54  Pulse: 66 84  Resp: 18 16  Temp: 97.7 F (36.5 C) 97.7 F (36.5 C)  SpO2: 96% 98%    Skin clean, dry and intact without  evidence of skin break down, no evidence of skin tears noted. IV catheter discontinued intact. Site without signs and symptoms of complications. Dressing and pressure applied. Pt denies pain at this time. No complaints noted.  An After Visit Summary was printed and given to the patient. Patient escorted via [place], and D/C home via private auto.  [name]

## 2017-10-18 NOTE — Care Management Note (Signed)
Case Management Note  Patient Details  Name: [name] MRN: [id number] Date of Birth: 29-Aug-1940  Subjective/Objective:   Patient to be discharged per MD order. Orders in place for home health services. Previous RNCM had began workup for home health via [facility]. Confirmed with patient and they would like to be set up with [facility]. Referral confirmed with Corene Cornea from [facility] for PT only. No DME needs. Family to provide transport. Ines Bloomer RN BSN RNCM [phone]                   Action/Plan:   Expected Discharge Date:  10/18/17               Expected Discharge Plan:  [place]  In-House Referral:     Discharge planning Services  CM Consult  Post Acute Care Choice:    Choice offered to:  Patient  DME Arranged:    DME Agency:     HH Arranged:  PT [place]:  [place]  Status of Service:  Completed, signed off  If discussed at [place] of Stay Meetings, dates discussed:    Additional Comments:  Latanya Maudlin, RN 10/18/2017, 1:57 PM

## 2017-10-24 ENCOUNTER — Ambulatory Visit (INDEPENDENT_AMBULATORY_CARE_PROVIDER_SITE_OTHER): Payer: Medicare Other

## 2017-10-24 DIAGNOSIS — I482 Chronic atrial fibrillation, unspecified: Secondary | ICD-10-CM

## 2017-10-24 DIAGNOSIS — Z5181 Encounter for therapeutic drug level monitoring: Secondary | ICD-10-CM

## 2017-10-24 LAB — POCT INR: INR: 4.5 — AB (ref 2.0–3.0)

## 2017-10-24 NOTE — Patient Instructions (Addendum)
Since you've already taken coumadin today, please have a large serving of greens today, skip coumadin tomorrow & Friday, then resume taking 2.5 mg daily except for 5 mg on Tuesdays and Saturdays.  Recheck in 2 weeks.

## 2017-11-07 ENCOUNTER — Ambulatory Visit (INDEPENDENT_AMBULATORY_CARE_PROVIDER_SITE_OTHER): Payer: Medicare Other

## 2017-11-07 DIAGNOSIS — I482 Chronic atrial fibrillation, unspecified: Secondary | ICD-10-CM

## 2017-11-07 DIAGNOSIS — Z5181 Encounter for therapeutic drug level monitoring: Secondary | ICD-10-CM | POA: Diagnosis not present

## 2017-11-07 LAB — POCT INR: INR: 3.3 — AB (ref 2.0–3.0)

## 2017-11-07 NOTE — Patient Instructions (Signed)
Since you've already taken coumadin today, please skip coumadin tomorrow, then resume taking 2.5 mg daily except for 5 mg on Tuesdays and Saturdays.  Recheck in 2 weeks.

## 2017-11-11 DIAGNOSIS — R05 Cough: Secondary | ICD-10-CM | POA: Diagnosis not present

## 2017-11-11 DIAGNOSIS — J40 Bronchitis, not specified as acute or chronic: Secondary | ICD-10-CM | POA: Diagnosis not present

## 2017-11-28 ENCOUNTER — Ambulatory Visit: Payer: Medicare Other | Attending: Nurse Practitioner | Admitting: Nurse Practitioner

## 2017-11-28 ENCOUNTER — Ambulatory Visit (INDEPENDENT_AMBULATORY_CARE_PROVIDER_SITE_OTHER): Payer: Medicare Other

## 2017-11-28 ENCOUNTER — Encounter: Payer: Self-pay | Admitting: Nurse Practitioner

## 2017-11-28 VITALS — Resp 16 | Ht 63.0 in | Wt 204.0 lb

## 2017-11-28 DIAGNOSIS — Z820 Family history of epilepsy and other diseases of the nervous system: Secondary | ICD-10-CM | POA: Insufficient documentation

## 2017-11-28 DIAGNOSIS — M461 Sacroiliitis, not elsewhere classified: Secondary | ICD-10-CM

## 2017-11-28 DIAGNOSIS — F410 Panic disorder [episodic paroxysmal anxiety] without agoraphobia: Secondary | ICD-10-CM | POA: Insufficient documentation

## 2017-11-28 DIAGNOSIS — I495 Sick sinus syndrome: Secondary | ICD-10-CM | POA: Insufficient documentation

## 2017-11-28 DIAGNOSIS — Z950 Presence of cardiac pacemaker: Secondary | ICD-10-CM | POA: Insufficient documentation

## 2017-11-28 DIAGNOSIS — K219 Gastro-esophageal reflux disease without esophagitis: Secondary | ICD-10-CM | POA: Diagnosis not present

## 2017-11-28 DIAGNOSIS — E785 Hyperlipidemia, unspecified: Secondary | ICD-10-CM | POA: Insufficient documentation

## 2017-11-28 DIAGNOSIS — M48062 Spinal stenosis, lumbar region with neurogenic claudication: Secondary | ICD-10-CM | POA: Insufficient documentation

## 2017-11-28 DIAGNOSIS — M79604 Pain in right leg: Secondary | ICD-10-CM | POA: Diagnosis present

## 2017-11-28 DIAGNOSIS — Z6836 Body mass index (BMI) 36.0-36.9, adult: Secondary | ICD-10-CM | POA: Insufficient documentation

## 2017-11-28 DIAGNOSIS — T402X5A Adverse effect of other opioids, initial encounter: Secondary | ICD-10-CM | POA: Insufficient documentation

## 2017-11-28 DIAGNOSIS — Z79899 Other long term (current) drug therapy: Secondary | ICD-10-CM | POA: Diagnosis not present

## 2017-11-28 DIAGNOSIS — E669 Obesity, unspecified: Secondary | ICD-10-CM | POA: Insufficient documentation

## 2017-11-28 DIAGNOSIS — G894 Chronic pain syndrome: Secondary | ICD-10-CM | POA: Diagnosis not present

## 2017-11-28 DIAGNOSIS — K5903 Drug induced constipation: Secondary | ICD-10-CM | POA: Insufficient documentation

## 2017-11-28 DIAGNOSIS — N183 Chronic kidney disease, stage 3 (moderate): Secondary | ICD-10-CM | POA: Diagnosis not present

## 2017-11-28 DIAGNOSIS — Z91041 Radiographic dye allergy status: Secondary | ICD-10-CM | POA: Insufficient documentation

## 2017-11-28 DIAGNOSIS — F4310 Post-traumatic stress disorder, unspecified: Secondary | ICD-10-CM | POA: Insufficient documentation

## 2017-11-28 DIAGNOSIS — Z823 Family history of stroke: Secondary | ICD-10-CM | POA: Insufficient documentation

## 2017-11-28 DIAGNOSIS — Z7901 Long term (current) use of anticoagulants: Secondary | ICD-10-CM | POA: Diagnosis not present

## 2017-11-28 DIAGNOSIS — M47818 Spondylosis without myelopathy or radiculopathy, sacral and sacrococcygeal region: Secondary | ICD-10-CM

## 2017-11-28 DIAGNOSIS — Z8261 Family history of arthritis: Secondary | ICD-10-CM | POA: Insufficient documentation

## 2017-11-28 DIAGNOSIS — G8929 Other chronic pain: Secondary | ICD-10-CM

## 2017-11-28 DIAGNOSIS — Z79891 Long term (current) use of opiate analgesic: Secondary | ICD-10-CM | POA: Insufficient documentation

## 2017-11-28 DIAGNOSIS — I129 Hypertensive chronic kidney disease with stage 1 through stage 4 chronic kidney disease, or unspecified chronic kidney disease: Secondary | ICD-10-CM | POA: Insufficient documentation

## 2017-11-28 DIAGNOSIS — I480 Paroxysmal atrial fibrillation: Secondary | ICD-10-CM | POA: Insufficient documentation

## 2017-11-28 DIAGNOSIS — Z5181 Encounter for therapeutic drug level monitoring: Secondary | ICD-10-CM

## 2017-11-28 DIAGNOSIS — J45909 Unspecified asthma, uncomplicated: Secondary | ICD-10-CM | POA: Diagnosis not present

## 2017-11-28 DIAGNOSIS — E039 Hypothyroidism, unspecified: Secondary | ICD-10-CM | POA: Insufficient documentation

## 2017-11-28 DIAGNOSIS — G4730 Sleep apnea, unspecified: Secondary | ICD-10-CM | POA: Diagnosis not present

## 2017-11-28 DIAGNOSIS — I482 Chronic atrial fibrillation, unspecified: Secondary | ICD-10-CM

## 2017-11-28 DIAGNOSIS — Z8249 Family history of ischemic heart disease and other diseases of the circulatory system: Secondary | ICD-10-CM | POA: Insufficient documentation

## 2017-11-28 DIAGNOSIS — M25552 Pain in left hip: Secondary | ICD-10-CM | POA: Insufficient documentation

## 2017-11-28 DIAGNOSIS — Z79890 Hormone replacement therapy (postmenopausal): Secondary | ICD-10-CM | POA: Diagnosis not present

## 2017-11-28 LAB — POCT INR: INR: 4.6 — AB (ref 2.0–3.0)

## 2017-11-28 MED ORDER — OXYCODONE HCL 5 MG PO TABS: 5.0000 mg | ORAL_TABLET | Freq: Every day | ORAL | 0 refills | Status: DC | PRN

## 2017-11-28 MED ORDER — OXYCODONE HCL 5 MG PO TABS: 5.0000 mg | ORAL_TABLET | Freq: Every day | ORAL | 0 refills | Status: DC

## 2017-11-28 NOTE — Progress Notes (Signed)
Patient's Name: [name]  MRN: [id number]  Referring Provider: Bryson Corona, NP  DOB: 1940/04/13  PCP: Bryson Corona, NP  DOS: 11/28/2017  Note by: Vevelyn Francois NP  Service setting: Ambulatory outpatient  Specialty: Interventional Pain Management  Location: [facility] (AMB) Pain Management Facility    Patient type: Established    Primary Reason(s) for Visit: Encounter for prescription drug management. (Level of risk: moderate)  CC: Hip Pain (left/ s/p hip replacement ) and Leg Pain (bilateral )  HPI  Ms. Alejo is a 77 y.o. year old, female patient, who comes today for a medication management evaluation. She has HYPOTHYROIDISM; HYPERLIPIDEMIA; OBESITY; Essential hypertension; SICK SINUS SYNDROME; Cardiac pacemaker in situ; Cough; GERD (gastroesophageal reflux disease); Atrial fibrillation ([place]); Chronic hip pain (Location of Primary Source of Pain) (Left); Long term current use of opiate analgesic; Long term prescription opiate use; Opiate use (54 MME/Day); Encounter for therapeutic drug level monitoring; Disturbance of skin sensation; Osteoarthritis of hip (Left); Trochanteric bursitis of hip (Left); Lumbar central spinal stenosis (L4-5); Lumbar facet arthropathy ([place]); Osteoarthritis of sacroiliac joint (with vacuum phenomena in) (Bilateral); Avascular necrosis of left femoral head ([place]); Opioid-induced constipation ([facility]); Hypothyroidism (acquired); Long term (current) use of opiate analgesic; Morbid obesity with BMI of 40.0-44.9, adult ([place]); PTSD (post-traumatic stress disorder); Spinal stenosis of lumbar region; S/P total hip arthroplasty; Chronic pain syndrome; Status post total replacement of left hip; CKD (chronic kidney disease) stage 3, GFR 30-59 ml/min ([place]); Anemia of chronic disease; Hypocalcemia; Disorder of bone, unspecified; Other specified health status; Encounter for therapeutic drug monitoring; Sick sinus syndrome ([place]); and Hematuria on their problem list. Her primarily concern  today is the Hip Pain (left/ s/p hip replacement ) and Leg Pain (bilateral )  Pain Assessment: Location: Left(bilateral ) Hip(leg) Radiating: patient states that she has bilateral leg pain all the way down to feet.  states left  hip s/p joint replacement x 2 years ago is very sore when she lays on it very long.  Onset: More than a month ago Duration: Chronic pain Quality: Discomfort, Aching, Constant Severity: 3 /10 (subjective, self-reported pain score)  Note: Reported level is compatible with observation.                          Effect on ADL: legs really bother her in the afternoon.    Timing: Constant(patient does fine up until about the afternoon and then  the legs begin to hurt. ) Modifying factors: sitting in recliner and elevating feet.  pain medication BP:    HR:    Ms. Wiegand was last scheduled for an appointment on 08/28/2017 for medication management. During today's appointment we reviewed Ms. Beamon's chronic pain status, as well as her outpatient medication regimen.  She admits that her pain is stable however it is worse at night when she is lying down.  He admits that it seems to take 2 hours for her medication to be effective at night.  Denies any current side effects of her medications.  She denies any new concerns today.  The patient  reports that she does not use drugs. Her body mass index is 36.14 kg/m.  Further details on both, my assessment(s), as well as the proposed treatment plan, please see below.  Controlled Substance Pharmacotherapy Assessment REMS (Risk Evaluation and Mitigation Strategy)  Analgesic:Oxycodone IR 5 mg 1 tablet by mouth 5 times a day (25 mg/day) MME/day:37.31m/day PJanett Billow RN  11/28/2017  9:39  AM  Sign at close encounter Nursing Pain Medication Assessment:  Safety precautions to be maintained throughout the outpatient stay will include: orient to surroundings, keep bed in low position, maintain call bell within reach at all times,  provide assistance with transfer out of bed and ambulation.  Medication Inspection Compliance: Pill count conducted under aseptic conditions, in front of the patient. Neither the pills nor the bottle was removed from the patient's sight at any time. Once count was completed pills were immediately returned to the patient in their original bottle.  Medication: Oxycodone IR Pill/Patch Count: 23 of 150 pills remain Pill/Patch Appearance: Markings consistent with prescribed medication Bottle Appearance: Standard pharmacy container. Clearly labeled. Filled Date: 09 / 07 / 2019 Last Medication intake:  Yesterday   Pharmacokinetics: Liberation and absorption (onset of action): WNL Distribution (time to peak effect): WNL Metabolism and excretion (duration of action): WNL         Pharmacodynamics: Desired effects: Analgesia: Ms. Faraci reports >50% benefit. Functional ability: Patient reports that medication allows her to accomplish basic ADLs Clinically meaningful improvement in function (CMIF): Sustained CMIF goals met Perceived effectiveness: Described as relatively effective, allowing for increase in activities of daily living (ADL) Undesirable effects: Side-effects or Adverse reactions: None reported Monitoring: [place] PMP: Online review of the past 9-monthperiod conducted. Compliant with practice rules and regulations Last UDS on record: Summary  Date Value Ref Range Status  12/05/2016 FINAL  Final    Comment:    ==================================================================== TOXASSURE SELECT 13 (MW) ==================================================================== Test                             Result       Flag       Units Drug Present and Declared for Prescription Verification   Oxazepam                       30           EXPECTED   ng/mg creat   Temazepam                      17           EXPECTED   ng/mg creat    Oxazepam and temazepam are expected metabolites of  diazepam.    Oxazepam is also an expected metabolite of other benzodiazepine    drugs, including chlordiazepoxide, prazepam, clorazepate,    halazepam, and temazepam.  Oxazepam and temazepam are available    as scheduled prescription medications.   Oxycodone                      567          EXPECTED   ng/mg creat   Oxymorphone                    148          EXPECTED   ng/mg creat   Noroxycodone                   1602         EXPECTED   ng/mg creat   Noroxymorphone                 2684         EXPECTED   ng/mg creat    Sources of oxycodone are scheduled prescription medications.  Oxymorphone, noroxycodone, and noroxymorphone are expected    metabolites of oxycodone. Oxymorphone is also available as a    scheduled prescription medication. ==================================================================== Test                      Result    Flag   Units      Ref Range   Creatinine              141              mg/dL      >=20 ==================================================================== Declared Medications:  The flagging and interpretation on this report are based on the  following declared medications.  Unexpected results may arise from  inaccuracies in the declared medications.  **Note: The testing scope of this panel includes these medications:  Diazepam (Valium)  Oxycodone (Roxicodone)  **Note: The testing scope of this panel does not include following  reported medications:  Colchicine  Hydrochlorothiazide (Maxzide)  Levothyroxine  Metoprolol (Lopressor)  Supplement  Triamterene (Maxzide)  Warfarin ==================================================================== For clinical consultation, please call [phone]. ====================================================================    UDS interpretation: Compliant          Medication Assessment Form: Reviewed. Patient indicates being compliant with therapy Treatment compliance: Compliant Risk Assessment  Profile: Aberrant behavior: See prior evaluations. None observed or detected today Comorbid factors increasing risk of overdose: See prior notes. No additional risks detected today Opioid risk tool (ORT) (Total Score): 0 Personal History of Substance Abuse (SUD-Substance use disorder):  Alcohol: Negative  Illegal Drugs: Negative  Rx Drugs: Negative  ORT Risk Level calculation: Low Risk Risk of substance use disorder (SUD): Low Opioid Risk Tool - 11/28/17 0936      Family History of Substance Abuse   Alcohol  Negative    Illegal Drugs  Negative    Rx Drugs  Negative      Personal History of Substance Abuse   Alcohol  Negative    Illegal Drugs  Negative    Rx Drugs  Negative      Psychological Disease   Psychological Disease  Negative    Depression  Negative      Total Score   Opioid Risk Tool Scoring  0    Opioid Risk Interpretation  Low Risk      ORT Scoring interpretation table:  Score <3 = Low Risk for SUD  Score between 4-7 = Moderate Risk for SUD  Score >8 = High Risk for Opioid Abuse   Risk Mitigation Strategies:  Patient Counseling: Covered Patient-Prescriber Agreement (PPA): Present and active  Notification to other healthcare providers: Done  Pharmacologic Plan: No change in therapy, at this time.             Laboratory Chemistry  Inflammation Markers (CRP: Acute Phase) (ESR: Chronic Phase) Lab Results  Component Value Date   CRP 1.6 03/07/2017   ESRSEDRATE 37 03/07/2017                         Rheumatology Markers Lab Results  Component Value Date   LABURIC 7.0 10/16/2017                        Renal Function Markers Lab Results  Component Value Date   BUN 24 (H) 10/17/2017   CREATININE 1.10 (H) 10/17/2017   BCR 25 03/07/2017   GFRAA 55 (L) 10/17/2017   GFRNONAA 47 (L) 10/17/2017  Hepatic Function Markers Lab Results  Component Value Date   AST 26 10/16/2017   ALT 10 10/16/2017   ALBUMIN 3.5 10/16/2017    ALKPHOS 51 10/16/2017                        Electrolytes Lab Results  Component Value Date   NA 138 10/17/2017   K 3.7 10/17/2017   CL 101 10/17/2017   CALCIUM 8.4 (L) 10/17/2017   MG 2.3 03/07/2017                        Neuropathy Markers Lab Results  Component Value Date   BZJIRCVE93 810 03/07/2017                        CNS Tests No results found for: COLORCSF, APPEARCSF, RBCCOUNTCSF, WBCCSF, POLYSCSF, LYMPHSCSF, EOSCSF, PROTEINCSF, GLUCCSF, JCVIRUS, CSFOLI, IGGCSF                      Bone Pathology Markers Lab Results  Component Value Date   25OHVITD1 39 03/07/2017   25OHVITD2 5.6 03/07/2017   25OHVITD3 33 03/07/2017                         Coagulation Parameters Lab Results  Component Value Date   INR 4.6 (A) 11/28/2017   LABPROT 16.7 (H) 10/18/2017   APTT 127 (H) 10/16/2017   PLT 193 10/17/2017                        Cardiovascular Markers Lab Results  Component Value Date   TROPONINI 0.04        NO INDICATION OF MYOCARDIAL INJURY. 05/22/2009   HGB 12.1 10/17/2017   HCT 34.5 (L) 10/17/2017                         CA Markers No results found for: CEA, CA125, LABCA2                      Note: Lab results reviewed.  Recent Diagnostic Imaging Results  US RENAL CLINICAL DATA:  Gross hematuria for 3 days  EXAM: RENAL / URINARY TRACT ULTRASOUND COMPLETE  COMPARISON:  None.  FINDINGS: Right Kidney:  Length: 9.5 cm. Echogenicity within normal limits. No mass or hydronephrosis visualized.  Left Kidney:  Length: 8.3 cm. Echogenicity within normal limits. No mass or hydronephrosis visualized.  Bladder:  Questionable trabeculation of the moderately distended bladder. No focal or definite wall thickening.  IMPRESSION: 1. No hydronephrosis. 2. Renal atrophy. 3. Moderately distended urinary bladder with possible trabeculation.  Electronically Signed   By: Monte Fantasia M.D.   On: 10/17/2017 14:59  Complexity Note: Imaging results  reviewed. Results shared with Ms. Lei, using Layman's terms.                         Meds   Current Outpatient Medications:  .  allopurinol (ZYLOPRIM) 100 MG tablet, Take 1 tablet by mouth 2 (two) times daily., Disp: , Rfl:  .  colchicine 0.6 MG tablet, Take 0.6 mg by mouth 3 (three) times a week. , Disp: , Rfl:  .  diazepam (VALIUM) 10 MG tablet, Take 10 mg by mouth 2 (two) times daily as needed for anxiety., Disp: , Rfl:  .  liothyronine (CYTOMEL) 5 MCG tablet, Take 5 mcg by mouth daily., Disp: , Rfl:  .  [START ON 01/31/2018] oxyCODONE (OXY IR/ROXICODONE) 5 MG immediate release tablet, Take 1 tablet (5 mg total) by mouth 5 (five) times daily as needed for severe pain., Disp: 150 tablet, Rfl: 0 .  thyroid (ARMOUR) 30 MG tablet, Take 30 mg by mouth daily before breakfast., Disp: , Rfl:  .  triamterene-hydrochlorothiazide (MAXZIDE) 75-50 MG tablet, Take 1 tablet by mouth daily., Disp: , Rfl:  .  warfarin (COUMADIN) 2.5 MG tablet, TAKE 1 TABLET DAILY (Patient taking differently: Take 2.5-5 mg by mouth daily. SUNDAY,MONDAY,WEDNESDAY,THURSDAY,FRIDAY-2.5MG TUESDAY,SATURDAY-5MG), Disp: 100 tablet, Rfl: 1 .  metoprolol tartrate (LOPRESSOR) 50 MG tablet, Take 1.5 tablets (75 mg total) by mouth 2 (two) times daily., Disp: 270 tablet, Rfl: 3 .  [START ON 01/01/2018] oxyCODONE (OXY IR/ROXICODONE) 5 MG immediate release tablet, Take 1 tablet (5 mg total) by mouth 5 (five) times daily as needed for severe pain., Disp: 150 tablet, Rfl: 0 .  [START ON 12/02/2017] oxyCODONE (OXY IR/ROXICODONE) 5 MG immediate release tablet, Take 1 tablet (5 mg total) by mouth 5 (five) times daily., Disp: 150 tablet, Rfl: 0  ROS  Constitutional: Denies any fever or chills Gastrointestinal: No reported hemesis, hematochezia, vomiting, or acute GI distress Musculoskeletal: Denies any acute onset joint swelling, redness, loss of ROM, or weakness Neurological: No reported episodes of acute onset apraxia, aphasia, dysarthria,  agnosia, amnesia, paralysis, loss of coordination, or loss of consciousness  Allergies  Ms. Sutter is allergic to contrast media [iodinated diagnostic agents].  PFSH  Drug: Ms. Doolan  reports that she does not use drugs. Alcohol:  reports that she does not drink alcohol. Tobacco:  reports that she has never smoked. She has never used smokeless tobacco. Medical:  has a past medical history of Anxiety, Aortic insufficiency, Arthritis, Asthma, Bronchitis, Bursitis, hip, Claustrophobia, Claustrophobia, Dysrhythmia, GERD (gastroesophageal reflux disease), Gout, Hyperlipidemia, Hypertension, Hypothyroidism, Mitral regurgitation, Obesity, Panic attacks, Paroxysmal atrial fibrillation ([place]), PTSD (post-traumatic stress disorder), Rhinitis, Sick sinus syndrome ([place]), and Sleep apnea. Surgical: Ms. Malak  has a past surgical history that includes Tonsillectomy; Pacemaker insertion; Status post child birth x1; Rotator cuff repair; Dilatation & curettage/hysteroscopy with myosure (N/A, 08/03/2015); Insert / replace / remove pacemaker; and Total hip arthroplasty (Left, 12/22/2015). Family: family history includes Alzheimer's disease in her father; Arthritis in her sister; Cancer in her brother and maternal grandfather; Heart disease in her mother; Hypertension in her sister; Stroke in her mother.  Constitutional Exam  General appearance: Well nourished, well developed, and well hydrated. In no apparent acute distress Vitals:   11/28/17 0927  Resp: 16  SpO2: 99%  Weight: 204 lb (92.5 kg)  Height: 5' 3" (1.6 m)   BMI Assessment: Estimated body mass index is 36.14 kg/m as calculated from the following:   Height as of this encounter: 5' 3" (1.6 m).   Weight as of this encounter: 204 lb (92.5 kg). Psych/Mental status: Alert, oriented x 3 (person, place, & time)       Eyes: PERLA Respiratory: No evidence of acute respiratory distress  Lumbar Spine Area Exam  Skin & Axial Inspection: No masses, redness, or  swelling Alignment: Symmetrical Functional ROM: Unrestricted ROM       Stability: No instability detected Muscle Tone/Strength: Functionally intact. No obvious neuro-muscular anomalies detected. Sensory (Neurological): Unimpaired Palpation: No palpable anomalies       Provocative Tests: Hyperextension/rotation test: deferred today       Lumbar quadrant  test (Kemp's test): deferred today       Lateral bending test: deferred today       Patrick's Maneuver: deferred today                   FABER test: deferred today                   S-I anterior distraction/compression test: deferred today         S-I lateral compression test: deferred today         S-I Thigh-thrust test: deferred today         S-I Gaenslen's test: deferred today          Gait & Posture Assessment  Ambulation: Unassisted Gait: Relatively normal for age and body habitus Posture: WNL   Lower Extremity Exam    Side: Right lower extremity  Side: Left lower extremity  Stability: No instability observed          Stability: No instability observed          Skin & Extremity Inspection: Skin color, temperature, and hair growth are WNL. No peripheral edema or cyanosis. No masses, redness, swelling, asymmetry, or associated skin lesions. No contractures.  Skin & Extremity Inspection: Evidence of prior arthroplastic surgery  Functional ROM: Unrestricted ROM                  Functional ROM: Unrestricted ROM                  Muscle Tone/Strength: Functionally intact. No obvious neuro-muscular anomalies detected.  Muscle Tone/Strength: Functionally intact. No obvious neuro-muscular anomalies detected.  Sensory (Neurological): Unimpaired  Sensory (Neurological): Unimpaired  Palpation: No palpable anomalies  Palpation: No palpable anomalies   Assessment  Primary Diagnosis & Pertinent Problem List: The primary encounter diagnosis was Chronic hip pain (Location of Primary Source of Pain) (Left). Diagnoses of Spinal stenosis of lumbar  region with neurogenic claudication, Osteoarthritis of sacroiliac joint (with vacuum phenomena in) (Bilateral), Chronic pain syndrome, and Long term current use of opiate analgesic were also pertinent to this visit.  Status Diagnosis  Controlled Controlled Controlled 1. Chronic hip pain (Location of Primary Source of Pain) (Left)   2. Spinal stenosis of lumbar region with neurogenic claudication   3. Osteoarthritis of sacroiliac joint (with vacuum phenomena in) (Bilateral)   4. Chronic pain syndrome   5. Long term current use of opiate analgesic     Problems updated and reviewed during this visit: Problem  Atrial Fibrillation (H[facility])   Overview:  Last Assessment & Plan:  Her ventricular rate is well controlled. No change in meds.   Overview:  Last Assessment & Plan:  Her ventricular rate is well controlled. No change in meds.   Overview:  Last Assessment & Plan:  Her ventricular rate is well controlled. No change in meds.   Overview:  Last Assessment & Plan:  Her ventricular rate is well controlled. No change in meds.    OBESITY   Qualifier: Diagnosis of  By: Burnett Kanaris    Overview:  Qualifier: Diagnosis of  By: Burnett Kanaris   Last Assessment & Plan:  I've encouraged the patient to continue her gluten-free diet. She is also encouraged to increase her physical activity.  Overview:  Qualifier: Diagnosis of  By: Burnett Kanaris   Last Assessment & Plan:  I've encouraged the patient to continue her gluten-free diet. She is also encouraged to increase her physical activity.    Plan  of Care  Pharmacotherapy (Medications Ordered): Meds ordered this encounter  Medications  . oxyCODONE (OXY IR/ROXICODONE) 5 MG immediate release tablet    Sig: Take 1 tablet (5 mg total) by mouth 5 (five) times daily as needed for severe pain.    Dispense:  150 tablet    Refill:  0    Do not add this medication to the electronic "Automatic Refill" notification system.  Patient may have prescription filled one day early if pharmacy is closed on scheduled refill date.    Order Specific Question:   Supervising Provider    Answer:   Milinda Pointer [phone]  . oxyCODONE (OXY IR/ROXICODONE) 5 MG immediate release tablet    Sig: Take 1 tablet (5 mg total) by mouth 5 (five) times daily as needed for severe pain.    Dispense:  150 tablet    Refill:  0    Do not add this medication to the electronic "Automatic Refill" notification system. Patient may have prescription filled one day early if pharmacy is closed on scheduled refill date.    Order Specific Question:   Supervising Provider    Answer:   Milinda Pointer [phone]  . oxyCODONE (OXY IR/ROXICODONE) 5 MG immediate release tablet    Sig: Take 1 tablet (5 mg total) by mouth 5 (five) times daily.    Dispense:  150 tablet    Refill:  0    Do not place this medication, or any other prescription from our practice, on "Automatic Refill". Patient may have prescription filled one day early if pharmacy is closed on scheduled refill date.    Order Specific Question:   Supervising Provider    Answer:   Milinda Pointer [[id number]]   New Prescriptions   No medications on file   Medications administered today: Tiffany Word had no medications administered during this visit. Lab-work, procedure(s), and/or referral(s): No orders of the defined types were placed in this encounter.  Imaging and/or referral(s): None  Interventional therapies: Planned, scheduled, and/or pending:   Not at this time.    Provider-requested follow-up: Return in about 3 months (around 02/28/2018) for MedMgmt.  Future Appointments  Date Time Provider [place]  12/04/2017  3:00 PM [facility] DEVICE 1 [facility]  12/12/2017  9:30 AM [facility] COUMADIN [facility]  02/28/2018 10:30 AM Vevelyn Francois, NP [facility] None   Primary Care Physician: Bryson Corona, NP Location: [facility] Note by: Vevelyn Francois NP Date: 11/28/2017; Time: 12:34 PM  Pain Score Disclaimer: We use the NRS-11 scale. This is a self-reported, subjective measurement of pain severity with only modest accuracy. It is used primarily to identify changes within a particular patient. It must be understood that outpatient pain scales are significantly less accurate that those used for research, where they can be applied under ideal controlled circumstances with minimal exposure to variables. In reality, the score is likely to be a combination of pain intensity and pain affect, where pain affect describes the degree of emotional arousal or changes in action readiness caused by the sensory experience of pain. Factors such as social and work situation, setting, emotional state, anxiety levels, expectation, and prior pain experience may influence pain perception and show large inter-individual differences that may also be affected by time variables.  Patient instructions provided during this appointment: Patient Instructions  ____________________________________________________________________________________________  Medication Rules  Applies to: All patients receiving prescriptions (written or electronic).  Pharmacy of record: Pharmacy where electronic prescriptions will be sent. If  written prescriptions are taken to a different pharmacy, please inform the nursing staff. The pharmacy listed in the electronic medical record should be the one where you would like electronic prescriptions to be sent.  Prescription refills: Only during scheduled appointments. Applies to both, written and electronic prescriptions.  NOTE: The following applies primarily to controlled substances (Opioid* Pain Medications).   Patient's responsibilities: 1. Pain Pills: Bring all pain pills to every appointment (except for procedure appointments). 2. Pill Bottles: Bring pills in original pharmacy bottle. Always bring  newest bottle. Bring bottle, even if empty. 3. Medication refills: You are responsible for knowing and keeping track of what medications you need refilled. The day before your appointment, write a list of all prescriptions that need to be refilled. Bring that list to your appointment and give it to the admitting nurse. Prescriptions will be written only during appointments. If you forget a medication, it will not be "Called in", "Faxed", or "electronically sent". You will need to get another appointment to get these prescribed. 4. Prescription Accuracy: You are responsible for carefully inspecting your prescriptions before leaving our office. Have the discharge nurse carefully go over each prescription with you, before taking them home. Make sure that your name is accurately spelled, that your address is correct. Check the name and dose of your medication to make sure it is accurate. Check the number of pills, and the written instructions to make sure they are clear and accurate. Make sure that you are given enough medication to last until your next medication refill appointment. 5. Taking Medication: Take medication as prescribed. Never take more pills than instructed. Never take medication more frequently than prescribed. Taking less pills or less frequently is permitted and encouraged, when it comes to controlled substances (written prescriptions).  6. Inform other Doctors: Always inform, all of your healthcare providers, of all the medications you take. 7. Pain Medication from other Providers: You are not allowed to accept any additional pain medication from any other Doctor or Healthcare provider. There are two exceptions to this rule. (see below) In the event that you require additional pain medication, you are responsible for notifying us, as stated below. 8. Medication Agreement: You are responsible for carefully reading and following our Medication Agreement. This must be signed before receiving any  prescriptions from our practice. Safely store a copy of your signed Agreement. Violations to the Agreement will result in no further prescriptions. (Additional copies of our Medication Agreement are available upon request.) 9. Laws, Rules, & Regulations: All patients are expected to follow all Federal and [employer], [employer], Rules, [employer]. Ignorance of the Laws does not constitute a valid excuse. The use of any illegal substances is prohibited. 10. Adopted CDC guidelines & recommendations: Target dosing levels will be at or below 60 MME/day. Use of benzodiazepines** is not recommended.  Exceptions: There are only two exceptions to the rule of not receiving pain medications from other Healthcare Providers. 1. Exception #1 (Emergencies): In the event of an emergency (i.e.: accident requiring emergency care), you are allowed to receive additional pain medication. However, you are responsible for: As soon as you are able, call our office [phone], at any time of the day or night, and leave a message stating your name, the date and nature of the emergency, and the name and dose of the medication prescribed. In the event that your call is answered by a member of our staff, make sure to document and save the date, time, and the  name of the person that took your information.  2. Exception #2 (Planned Surgery): In the event that you are scheduled by another doctor or dentist to have any type of surgery or procedure, you are allowed (for a period no longer than 30 days), to receive additional pain medication, for the acute post-op pain. However, in this case, you are responsible for picking up a copy of our "Post-op Pain Management for Surgeons" handout, and giving it to your surgeon or dentist. This document is available at our office, and does not require an appointment to obtain it. Simply go to our office during business hours (Monday-Thursday from 8:00 AM to 4:00 PM) (Friday 8:00 AM to 12:00 Noon) or  if you have a scheduled appointment with [place], prior to your surgery, and ask for it by name. In addition, you will need to provide [place] with your name, name of your surgeon, type of surgery, and date of procedure or surgery.  *Opioid medications include: morphine, codeine, oxycodone, oxymorphone, hydrocodone, hydromorphone, meperidine, tramadol, tapentadol, buprenorphine, fentanyl, methadone. **Benzodiazepine medications include: diazepam (Valium), alprazolam (Xanax), clonazepam (Klonopine), lorazepam (Ativan), clorazepate (Tranxene), chlordiazepoxide (Librium), estazolam (Prosom), oxazepam (Serax), temazepam (Restoril), triazolam (Halcion) (Last updated: 04/26/2017) ____________________________________________________________________________________________ Oxycodone 5 mg x 3 months to begin filling on 12/02/17 escribed to [place] near [employer].

## 2017-11-28 NOTE — Patient Instructions (Addendum)
____________________________________________________________________________________________  Medication Rules  Applies to: All patients receiving prescriptions (written or electronic).  Pharmacy of record: Pharmacy where electronic prescriptions will be sent. If written prescriptions are taken to a different pharmacy, please inform the nursing staff. The pharmacy listed in the electronic medical record should be the one where you would like electronic prescriptions to be sent.  Prescription refills: Only during scheduled appointments. Applies to both, written and electronic prescriptions.  NOTE: The following applies primarily to controlled substances (Opioid* Pain Medications).   Patient's responsibilities: 1. Pain Pills: Bring all pain pills to every appointment (except for procedure appointments). 2. Pill Bottles: Bring pills in original pharmacy bottle. Always bring newest bottle. Bring bottle, even if empty. 3. Medication refills: You are responsible for knowing and keeping track of what medications you need refilled. The day before your appointment, write a list of all prescriptions that need to be refilled. Bring that list to your appointment and give it to the admitting nurse. Prescriptions will be written only during appointments. If you forget a medication, it will not be "Called in", "Faxed", or "electronically sent". You will need to get another appointment to get these prescribed. 4. Prescription Accuracy: You are responsible for carefully inspecting your prescriptions before leaving our office. Have the discharge nurse carefully go over each prescription with you, before taking them home. Make sure that your name is accurately spelled, that your address is correct. Check the name and dose of your medication to make sure it is accurate. Check the number of pills, and the written instructions to make sure they are clear and accurate. Make sure that you are given enough medication to last  until your next medication refill appointment. 5. Taking Medication: Take medication as prescribed. Never take more pills than instructed. Never take medication more frequently than prescribed. Taking less pills or less frequently is permitted and encouraged, when it comes to controlled substances (written prescriptions).  6. Inform other Doctors: Always inform, all of your healthcare providers, of all the medications you take. 7. Pain Medication from other Providers: You are not allowed to accept any additional pain medication from any other Doctor or Healthcare provider. There are two exceptions to this rule. (see below) In the event that you require additional pain medication, you are responsible for notifying us, as stated below. 8. Medication Agreement: You are responsible for carefully reading and following our Medication Agreement. This must be signed before receiving any prescriptions from our practice. Safely store a copy of your signed Agreement. Violations to the Agreement will result in no further prescriptions. (Additional copies of our Medication Agreement are available upon request.) 9. Laws, Rules, & Regulations: All patients are expected to follow all Federal and State Laws, Statutes, Rules, & Regulations. Ignorance of the Laws does not constitute a valid excuse. The use of any illegal substances is prohibited. 10. Adopted CDC guidelines & recommendations: Target dosing levels will be at or below 60 MME/day. Use of benzodiazepines** is not recommended.  Exceptions: There are only two exceptions to the rule of not receiving pain medications from other Healthcare Providers. 1. Exception #1 (Emergencies): In the event of an emergency (i.e.: accident requiring emergency care), you are allowed to receive additional pain medication. However, you are responsible for: As soon as you are able, call our office [phone], at any time of the day or night, and leave a message stating your name, the  date and nature of the emergency, and the name and dose of the medication   prescribed. In the event that your call is answered by a member of our staff, make sure to document and save the date, time, and the name of the person that took your information.  2. Exception #2 (Planned Surgery): In the event that you are scheduled by another doctor or dentist to have any type of surgery or procedure, you are allowed (for a period no longer than 30 days), to receive additional pain medication, for the acute post-op pain. However, in this case, you are responsible for picking up a copy of our "Post-op Pain Management for Surgeons" handout, and giving it to your surgeon or dentist. This document is available at our office, and does not require an appointment to obtain it. Simply go to our office during business hours (Monday-Thursday from 8:00 AM to 4:00 PM) (Friday 8:00 AM to 12:00 Noon) or if you have a scheduled appointment with [place], prior to your surgery, and ask for it by name. In addition, you will need to provide [place] with your name, name of your surgeon, type of surgery, and date of procedure or surgery.  *Opioid medications include: morphine, codeine, oxycodone, oxymorphone, hydrocodone, hydromorphone, meperidine, tramadol, tapentadol, buprenorphine, fentanyl, methadone. **Benzodiazepine medications include: diazepam (Valium), alprazolam (Xanax), clonazepam (Klonopine), lorazepam (Ativan), clorazepate (Tranxene), chlordiazepoxide (Librium), estazolam (Prosom), oxazepam (Serax), temazepam (Restoril), triazolam (Halcion) (Last updated: 04/26/2017) ____________________________________________________________________________________________ Oxycodone 5 mg x 3 months to begin filling on 12/02/17 escribed to [place] near [employer].

## 2017-11-28 NOTE — Patient Instructions (Signed)
Since you've already taken coumadin today, please skip coumadin tomorrow & Friday, then START NEW DOSAGE of 2.5 mg every day.  Please have a serving of greens today.  Recheck in 2 weeks.

## 2017-11-28 NOTE — Progress Notes (Signed)
Nursing Pain Medication Assessment:  Safety precautions to be maintained throughout the outpatient stay will include: orient to surroundings, keep bed in low position, maintain call bell within reach at all times, provide assistance with transfer out of bed and ambulation.  Medication Inspection Compliance: Pill count conducted under aseptic conditions, in front of the patient. Neither the pills nor the bottle was removed from the patient's sight at any time. Once count was completed pills were immediately returned to the patient in their original bottle.  Medication: Oxycodone IR Pill/Patch Count: 23 of 150 pills remain Pill/Patch Appearance: Markings consistent with prescribed medication Bottle Appearance: Standard pharmacy container. Clearly labeled. Filled Date: 09 / 07 / 2019 Last Medication intake:  Yesterday

## 2017-12-04 ENCOUNTER — Ambulatory Visit (INDEPENDENT_AMBULATORY_CARE_PROVIDER_SITE_OTHER): Payer: Medicare Other | Admitting: *Deleted

## 2017-12-04 DIAGNOSIS — I482 Chronic atrial fibrillation, unspecified: Secondary | ICD-10-CM

## 2017-12-04 DIAGNOSIS — Z950 Presence of cardiac pacemaker: Secondary | ICD-10-CM

## 2017-12-04 LAB — CUP PACEART INCLINIC DEVICE CHECK
Date Time Interrogation Session: 20191008165720
Implantable Lead Implant Date: 20090317
Implantable Lead Implant Date: 20090317
Implantable Pulse Generator Implant Date: 20090317
Lead Channel Impedance Value: 491 Ohm
Lead Channel Pacing Threshold Pulse Width: 0.4 ms
Lead Channel Sensing Intrinsic Amplitude: 12 mV
MDC IDC LEAD LOCATION: 753859
MDC IDC LEAD LOCATION: 753860
MDC IDC MSMT BATTERY IMPEDANCE: 7200 Ohm
MDC IDC MSMT BATTERY REMAINING LONGEVITY: 21 mo
MDC IDC MSMT BATTERY VOLTAGE: 2.74 V
MDC IDC MSMT LEADCHNL RV PACING THRESHOLD AMPLITUDE: 0.75 V
MDC IDC SET LEADCHNL RV PACING AMPLITUDE: 2.5 V
MDC IDC SET LEADCHNL RV PACING PULSEWIDTH: 0.4 ms
MDC IDC SET LEADCHNL RV SENSING SENSITIVITY: 2 mV
MDC IDC STAT BRADY RV PERCENT PACED: 65 %
Pulse Gen Serial Number: 2056081

## 2017-12-04 NOTE — Progress Notes (Signed)
Pacemaker check in clinic. Normal device function. Thresholds, sensing, impedances consistent with previous measurements. Device programmed to maximize longevity. Permanent AF, +warfarin. No episode triggers enabled. Device programmed at appropriate safety margins. Histogram distribution appropriate for patient activity level. Device programmed to optimize intrinsic conduction. Estimated longevity 1.75 years. Patient enrolled in [place] with [employer]. Patient education completed. Patient requests transfer to [facility] as she recently moved to [place]. Advised I will route message to GT/SK to request transfer. Plan for ROV with [facility] in 02/2018.

## 2017-12-12 ENCOUNTER — Encounter: Payer: Self-pay | Admitting: Internal Medicine

## 2017-12-12 ENCOUNTER — Ambulatory Visit (INDEPENDENT_AMBULATORY_CARE_PROVIDER_SITE_OTHER): Payer: Medicare Other

## 2017-12-12 DIAGNOSIS — Z5181 Encounter for therapeutic drug level monitoring: Secondary | ICD-10-CM

## 2017-12-12 DIAGNOSIS — Z79899 Other long term (current) drug therapy: Secondary | ICD-10-CM | POA: Diagnosis not present

## 2017-12-12 DIAGNOSIS — D510 Vitamin B12 deficiency anemia due to intrinsic factor deficiency: Secondary | ICD-10-CM | POA: Diagnosis not present

## 2017-12-12 DIAGNOSIS — I7090 Unspecified atherosclerosis: Secondary | ICD-10-CM | POA: Diagnosis not present

## 2017-12-12 DIAGNOSIS — I482 Chronic atrial fibrillation, unspecified: Secondary | ICD-10-CM | POA: Diagnosis not present

## 2017-12-12 DIAGNOSIS — E559 Vitamin D deficiency, unspecified: Secondary | ICD-10-CM | POA: Diagnosis not present

## 2017-12-12 DIAGNOSIS — E569 Vitamin deficiency, unspecified: Secondary | ICD-10-CM | POA: Diagnosis not present

## 2017-12-12 DIAGNOSIS — E039 Hypothyroidism, unspecified: Secondary | ICD-10-CM | POA: Diagnosis not present

## 2017-12-12 DIAGNOSIS — Z780 Asymptomatic menopausal state: Secondary | ICD-10-CM | POA: Diagnosis not present

## 2017-12-12 DIAGNOSIS — R5382 Chronic fatigue, unspecified: Secondary | ICD-10-CM | POA: Diagnosis not present

## 2017-12-12 DIAGNOSIS — R799 Abnormal finding of blood chemistry, unspecified: Secondary | ICD-10-CM | POA: Diagnosis not present

## 2017-12-12 LAB — POCT INR: INR: 2.1 (ref 2.0–3.0)

## 2017-12-12 NOTE — Patient Instructions (Signed)
Since continue dosage of 2.5 mg every day.  Please have a serving of greens today.  Recheck in 2 weeks.

## 2017-12-18 DIAGNOSIS — E039 Hypothyroidism, unspecified: Secondary | ICD-10-CM | POA: Diagnosis not present

## 2017-12-18 DIAGNOSIS — M109 Gout, unspecified: Secondary | ICD-10-CM | POA: Diagnosis not present

## 2017-12-26 ENCOUNTER — Ambulatory Visit (INDEPENDENT_AMBULATORY_CARE_PROVIDER_SITE_OTHER): Payer: Medicare Other

## 2017-12-26 DIAGNOSIS — Z5181 Encounter for therapeutic drug level monitoring: Secondary | ICD-10-CM

## 2017-12-26 DIAGNOSIS — I482 Chronic atrial fibrillation, unspecified: Secondary | ICD-10-CM | POA: Diagnosis not present

## 2017-12-26 LAB — POCT INR: INR: 2.8 (ref 2.0–3.0)

## 2017-12-26 NOTE — Patient Instructions (Signed)
Please continue dosage of 2.5 mg every day.  Recheck in 3 weeks. 

## 2018-01-16 ENCOUNTER — Ambulatory Visit (INDEPENDENT_AMBULATORY_CARE_PROVIDER_SITE_OTHER): Payer: Medicare Other

## 2018-01-16 DIAGNOSIS — I482 Chronic atrial fibrillation, unspecified: Secondary | ICD-10-CM

## 2018-01-16 DIAGNOSIS — Z5181 Encounter for therapeutic drug level monitoring: Secondary | ICD-10-CM | POA: Diagnosis not present

## 2018-01-16 LAB — POCT INR: INR: 1.7 — AB (ref 2.0–3.0)

## 2018-01-16 NOTE — Patient Instructions (Signed)
Please take 2 tablets tonight, then continue dosage of 2.5 mg every day.  Recheck in 2 weeks.

## 2018-01-30 ENCOUNTER — Ambulatory Visit (INDEPENDENT_AMBULATORY_CARE_PROVIDER_SITE_OTHER): Payer: Medicare Other

## 2018-01-30 DIAGNOSIS — I482 Chronic atrial fibrillation, unspecified: Secondary | ICD-10-CM

## 2018-01-30 DIAGNOSIS — Z5181 Encounter for therapeutic drug level monitoring: Secondary | ICD-10-CM | POA: Diagnosis not present

## 2018-01-30 LAB — POCT INR: INR: 2.1 (ref 2.0–3.0)

## 2018-01-30 NOTE — Patient Instructions (Signed)
Please continue dosage of 2.5 mg every day.  Recheck in 2 & 1/2 weeks.

## 2018-02-04 ENCOUNTER — Telehealth: Payer: Self-pay | Admitting: Internal Medicine

## 2018-02-04 NOTE — Telephone Encounter (Signed)
Picture received. Redness noted at device site x 3-4 days per patient. Patient denies any fever/chills. Patient denies any bumps in the area. Patient states that it itches. Will review w/Dr.Allred and notify patient of recommendations.

## 2018-02-04 NOTE — Telephone Encounter (Signed)
Image reviewed with Dr.Allred. Dr.Allred recommends that patient use hydrocortisone cream for the next couple of days, and call back on Wednesday to inform us if it's getting better or worse.   If worse patient needs to f/u w/DC on Wednesday (for GT to assess), and if better she needs to f/u w/DC next week when SK is in the office.  I called patient and informed her of Dr.Allred's recommendations. Patient verbalized understanding and plans to call on Wednesday w/ update.

## 2018-02-04 NOTE — Telephone Encounter (Signed)
Called and requested that patient send an image through [place]. Patient verbalized understanding and plans to send in an hour.

## 2018-02-04 NOTE — Telephone Encounter (Signed)
  1. Has your device fired? NO  2. Is you device beeping? NO  3. Are you experiencing draining or swelling at device site? NO, JUST A RED RASH ON TOP  4. Are you calling to see if we received your device transmission? NO  5. Have you passed out? NO    Please route to [place]

## 2018-02-07 NOTE — Telephone Encounter (Signed)
Spoke with pt today regarding her past c/o "rash over her PPM site." Pt was offered a 1:45pm device clinic appt for further assessment. She declined on behalf of caring for her husband who needs IV antibiotics administered around the same time.   Pt states her "rash is getting better and isn't as red." Pt denies any acute illness, swelling, pain, or warmth around the site. She currently is using hydrocortisone cream for itching per Dr Jackalyn Lombard recommendation. Pt will call the office on Monday Dec 16 with an update of the site. She has verbalized understanding if her site becomes red, swollen, warm, or if she begins to become feverish, she is to report to the ED asap with no exceptions. She agrees with plan and had no additional questions.

## 2018-02-11 NOTE — Telephone Encounter (Signed)
Called patient regarding ppm site. Patient states that the site is still red and itchy. She stated that it may have gotten a little better, but not much. Patient denies fever/chills. Patient wants to be seen in the [place] office on Thursday after 1000. Will forward information to scheduler. Patient verbalized understanding.

## 2018-02-12 NOTE — Telephone Encounter (Signed)
Patient is scheduled for 02/14/18 at 10:45am with Dr. Caryl Comes in [place].

## 2018-02-13 ENCOUNTER — Other Ambulatory Visit: Payer: Self-pay

## 2018-02-13 NOTE — Telephone Encounter (Signed)
Please review for refill.  

## 2018-02-13 NOTE — Telephone Encounter (Signed)
The patient has an appointment with Dr. Caryl Comes on 12/19- will address her refill at that time.  She has previously seen Dr. Lovena Le.

## 2018-02-14 ENCOUNTER — Ambulatory Visit (INDEPENDENT_AMBULATORY_CARE_PROVIDER_SITE_OTHER): Payer: Medicare Other | Admitting: Internal Medicine

## 2018-02-14 ENCOUNTER — Encounter: Payer: Self-pay | Admitting: Internal Medicine

## 2018-02-14 ENCOUNTER — Telehealth: Payer: Self-pay | Admitting: Internal Medicine

## 2018-02-14 ENCOUNTER — Other Ambulatory Visit: Payer: Self-pay | Admitting: Pharmacist

## 2018-02-14 VITALS — BP 150/86 | HR 84 | Ht 61.0 in | Wt 193.2 lb

## 2018-02-14 DIAGNOSIS — I482 Chronic atrial fibrillation, unspecified: Secondary | ICD-10-CM

## 2018-02-14 DIAGNOSIS — Z950 Presence of cardiac pacemaker: Secondary | ICD-10-CM | POA: Diagnosis not present

## 2018-02-14 DIAGNOSIS — I442 Atrioventricular block, complete: Secondary | ICD-10-CM | POA: Diagnosis not present

## 2018-02-14 DIAGNOSIS — T84031D Mechanical loosening of internal left hip prosthetic joint, subsequent encounter: Secondary | ICD-10-CM | POA: Diagnosis not present

## 2018-02-14 DIAGNOSIS — M25552 Pain in left hip: Secondary | ICD-10-CM | POA: Diagnosis not present

## 2018-02-14 MED ORDER — WARFARIN SODIUM 2.5 MG PO TABS: 2.5000 mg | ORAL_TABLET | Freq: Every day | ORAL | 1 refills | Status: DC

## 2018-02-14 NOTE — Progress Notes (Signed)
Patient Care Team: Bryson Corona, NP as PCP - General (Family Medicine) Fay Records, MD as PCP - Cardiology (Cardiology) Mingo Amber, MD as Attending Physician (Internal Medicine)   HPI  [name] is a 77 y.o. female Seen in follow-up for pacemaker implanted 2010 by Dr. Elliot Cousin for bradycardia in the context of permanent atrial fibrillation.  Date Cr Hgb  8/19 1.1 12.1        The patient denies chest pain, shortness of breath, nocturnal dyspnea, orthopnea or peripheral edema.  There have been no palpitations, lightheadedness or syncope.   She has a rash-pruritic that has emerged over the last couple of weeks-- no trauma or new clothes   Records and Results Reviewed   Past Medical History:  Diagnosis Date  . Anxiety   . Aortic insufficiency    Trivial  . Arthritis   . Asthma   . Bronchitis   . Bursitis, hip   . Claustrophobia   . Claustrophobia   . Dysrhythmia    a fib  . GERD (gastroesophageal reflux disease)    history of  . Gout   . Hyperlipidemia   . Hypertension   . Hypothyroidism   . Mitral regurgitation   . Obesity   . Panic attacks   . Paroxysmal atrial fibrillation (HCC)   . PTSD (post-traumatic stress disorder)   . Rhinitis   . Sick sinus syndrome ([place])   . Sleep apnea    was ordered CPAP, but never fitted for one years ago, Dr has retired    Past Surgical History:  Procedure Laterality Date  . DILATATION & CURETTAGE/HYSTEROSCOPY WITH MYOSURE N/A 08/03/2015   Procedure: DILATATION & CURETTAGE/HYSTEROSCOPY WITH MYOSURE;  Surgeon: Alden Hipp, MD;  Location: [facility];  Service: Gynecology;  Laterality: N/A;  Patient has an ICD   . INSERT / REPLACE / REMOVE PACEMAKER    . PACEMAKER INSERTION     Status post insertion  . ROTATOR CUFF REPAIR     left  . Status post child birth x1    . TONSILLECTOMY    . TOTAL HIP ARTHROPLASTY Left 12/22/2015   Procedure: TOTAL HIP ARTHROPLASTY;  Surgeon: Dereck Leep, MD;  Location: [facility];   Service: Orthopedics;  Laterality: Left;    Current Meds  Medication Sig  . allopurinol (ZYLOPRIM) 100 MG tablet Take 1 tablet by mouth 2 (two) times daily.  . colchicine 0.6 MG tablet Take 0.6 mg by mouth 3 (three) times a week.   . diazepam (VALIUM) 10 MG tablet Take 10 mg by mouth 2 (two) times daily as needed for anxiety.  Marland Kitchen liothyronine (CYTOMEL) 5 MCG tablet Take 5 mcg by mouth daily.  Marland Kitchen oxyCODONE (OXY IR/ROXICODONE) 5 MG immediate release tablet Take 1 tablet (5 mg total) by mouth 5 (five) times daily as needed for severe pain.  Marland Kitchen thyroid (ARMOUR) 30 MG tablet Take 30 mg by mouth daily before breakfast.  . triamterene-hydrochlorothiazide (MAXZIDE) 75-50 MG tablet Take 1 tablet by mouth daily.  Marland Kitchen warfarin (COUMADIN) 2.5 MG tablet TAKE 1 TABLET DAILY (Patient taking differently: Take 2.5-5 mg by mouth daily. SUNDAY,MONDAY,WEDNESDAY,THURSDAY,FRIDAY-2.5MG  TUESDAY,SATURDAY-5MG )    Allergies  Allergen Reactions  . Contrast Media [Iodinated Diagnostic Agents] Rash    Contrast dye causes swelling      Review of Systems negative except from HPI and PMH  Physical Exam BP (!) 150/86 (BP Location: Left Arm, Patient Position: Sitting, Cuff Size: Normal)   Pulse 84   Ht  5\' 1"  (1.549 m)   Wt 193 lb 4 oz (87.7 kg)   BMI 36.51 kg/m  Well developed and well nourished in no acute distress HENT normal E scleral and icterus clear Neck Supple JVP flat; carotids brisk and full Clear to ausculation Device pocket well healed; without hematoma or erythema.  There is no tethering  Regular rate and rhythm, no murmurs gallops or rub Soft with active bowel sounds No clubbing cyanosis  Edema Alert and oriented, grossly normal motor and sensory function Skin Warm and Dry; pruritic rash over the device  ECG Afib @ 84  Assessment and  Plan  Atrial fibrillation   Bradycardia symptomatic  Pacemaker [employer]   Rash    Have reviewed photo of rash with JA and GT, both agree wioth plan for  topical steroids  NO tethering to suggest infection or metal allergy  Have also inactivated Rate response as she overdrives her sensor   On Anticoagulation;  No bleeding issues   We spent more than 50% of our >25 min visit in face to face counseling regarding the above   Current medicines are reviewed at length with the patient today .  The patient does not  have concerns regarding medicines.

## 2018-02-14 NOTE — Telephone Encounter (Signed)
° °  [facility] Pre-operative Risk Assessment    Request for surgical clearance:  1. What type of surgery is being performed? Revision of left total hip arthroplasty    2. When is this surgery scheduled? ASAP   3. What type of clearance is required (medical clearance vs. Pharmacy clearance to hold med vs. Both)? both  4. Are there any medications that need to be held prior to surgery and how long? Blood thinner  5. Practice name and name of physician performing surgery? [facility]    6. What is your office phone number [phone]    7.   What is your office fax number [phone]  8.   Anesthesia type (None, local, MAC, general) ?    Caryl Pina Gerringer 02/14/2018, 12:00 PM  _________________________________________________________________   (provider comments below)

## 2018-02-14 NOTE — Patient Instructions (Signed)
Medication Instructions:  - Your physician recommends that you continue on your current medications as directed. Please refer to the Current Medication list given to you today.  If you need a refill on your cardiac medications before your next appointment, please call your pharmacy.   Lab work: - none ordered  If you have labs (blood work) drawn today and your tests are completely normal, you will receive your results only by: Marland Kitchen MyChart Message (if you have MyChart) OR . A paper copy in the mail If you have any lab test that is abnormal or we need to change your treatment, we will call you to review the results.  Testing/Procedures: - none ordered  Follow-Up: At [facility], you and your health needs are our priority.  As part of our continuing mission to provide you with exceptional heart care, we have created designated Provider Care Teams.  These Care Teams include your primary Cardiologist (physician) and Advanced Practice Providers (APPs -  Physician Assistants and Nurse Practitioners) who all work together to provide you with the care you need, when you need it. . 3 weeks with Dr. Caryl Comes  Any Other Special Instructions Will Be Listed Below (If Applicable). - Please use 1% Hydrocortisone Cream to apply to the rash over your pacemaker.

## 2018-02-15 NOTE — Telephone Encounter (Signed)
Clearance form and device clearance form completed by Dr Caryl Comes and successfully faxed to [facility] on 02/14/18.

## 2018-02-18 ENCOUNTER — Ambulatory Visit (INDEPENDENT_AMBULATORY_CARE_PROVIDER_SITE_OTHER): Payer: Medicare Other

## 2018-02-18 DIAGNOSIS — Z5181 Encounter for therapeutic drug level monitoring: Secondary | ICD-10-CM

## 2018-02-18 DIAGNOSIS — I482 Chronic atrial fibrillation, unspecified: Secondary | ICD-10-CM

## 2018-02-18 LAB — POCT INR: INR: 2.1 (ref 2.0–3.0)

## 2018-02-18 NOTE — Patient Instructions (Signed)
Please continue dosage of 2.5 mg every day.  Since you are having hip revision surgery on 1/3, please hold your coumadin for 5 days starting on Sunday, 12/29.  If ok'd by the surgeon, resume coumadin the night of your procedure w/ extra 1/2 tablet for 2 days.  If you have home health or PT come out, please have them check your INR and call [facility] w/ results @ [phone] (on Mondays or Wednesdays).   Recheck the week after your procedure.   If you cannot get your INR checked on a Monday or Wednesday, please call the [place] office at [phone].

## 2018-02-21 ENCOUNTER — Other Ambulatory Visit: Payer: Self-pay

## 2018-02-21 ENCOUNTER — Encounter
Admission: RE | Admit: 2018-02-21 | Discharge: 2018-02-21 | Disposition: A | Discharge status: Home / Self Care | Payer: Medicare Other | Source: Ambulatory Visit | Attending: Orthopedic Surgery | Admitting: Orthopedic Surgery

## 2018-02-21 DIAGNOSIS — Z01812 Encounter for preprocedural laboratory examination: Secondary | ICD-10-CM | POA: Insufficient documentation

## 2018-02-21 LAB — COMPREHENSIVE METABOLIC PANEL
ALT: 13 U/L (ref 0–44)
AST: 24 U/L (ref 15–41)
Albumin: 3.8 g/dL (ref 3.5–5.0)
Alkaline Phosphatase: 64 U/L (ref 38–126)
Anion gap: 7 (ref 5–15)
BUN: 27 mg/dL — ABNORMAL HIGH (ref 8–23)
CHLORIDE: 106 mmol/L (ref 98–111)
CO2: 24 mmol/L (ref 22–32)
CREATININE: 1.06 mg/dL — AB (ref 0.44–1.00)
Calcium: 9.4 mg/dL (ref 8.9–10.3)
GFR calc Af Amer: 58 mL/min — ABNORMAL LOW (ref >60)
GFR calc non Af Amer: 50 mL/min — ABNORMAL LOW (ref >60)
Glucose, Bld: 104 mg/dL — ABNORMAL HIGH (ref 70–99)
Potassium: 4 mmol/L (ref 3.5–5.1)
Sodium: 137 mmol/L (ref 135–145)
Total Bilirubin: 0.6 mg/dL (ref 0.3–1.2)
Total Protein: 7.2 g/dL (ref 6.5–8.1)

## 2018-02-21 LAB — CBC WITH DIFFERENTIAL/PLATELET
Abs Immature Granulocytes: 0.01 10*3/uL (ref 0.00–0.07)
Basophils Absolute: 0 10*3/uL (ref 0.0–0.1)
Basophils Relative: 1 %
Eosinophils Absolute: 0.1 10*3/uL (ref 0.0–0.5)
Eosinophils Relative: 2 %
HCT: 44.5 % (ref 36.0–46.0)
Hemoglobin: 14.2 g/dL (ref 12.0–15.0)
Immature Granulocytes: 0 %
Lymphocytes Relative: 39 %
Lymphs Abs: 2.3 10*3/uL (ref 0.7–4.0)
MCH: 31 pg (ref 26.0–34.0)
MCHC: 31.9 g/dL (ref 30.0–36.0)
MCV: 97.2 fL (ref 80.0–100.0)
MONOS PCT: 8 %
Monocytes Absolute: 0.5 10*3/uL (ref 0.1–1.0)
Neutro Abs: 3 10*3/uL (ref 1.7–7.7)
Neutrophils Relative %: 50 %
Platelets: 174 10*3/uL (ref 150–400)
RBC: 4.58 MIL/uL (ref 3.87–5.11)
RDW: 14.2 % (ref 11.5–15.5)
WBC: 5.8 10*3/uL (ref 4.0–10.5)
nRBC: 0 % (ref 0.0–0.2)

## 2018-02-21 LAB — PROTIME-INR
INR: 1.99
Prothrombin Time: 22.3 seconds — ABNORMAL HIGH (ref 11.4–15.2)

## 2018-02-21 LAB — SEDIMENTATION RATE: Sed Rate: 17 mm/hr (ref 0–30)

## 2018-02-21 LAB — APTT: aPTT: 36 seconds (ref 24–36)

## 2018-02-21 LAB — SURGICAL PCR SCREEN
MRSA, PCR: NEGATIVE
Staphylococcus aureus: NEGATIVE

## 2018-02-21 NOTE — Pre-Procedure Instructions (Signed)
Faxed Pacemaker form to Dr. Dorris Carnes. Fax confirmation received. I also called the office to alert them to the fax.

## 2018-02-21 NOTE — Patient Instructions (Signed)
Your procedure is scheduled on: Friday 03/01/18.  Report to DAY SURGERY DEPARTMENT LOCATED ON 2ND FLOOR MEDICAL MALL ENTRANCE. To find out your arrival time please call [phone] between 1PM - 3PM on Thursday 02/28/18.   Remember: Instructions that are not followed completely may result in serious medical risk, up to and including death, or upon the discretion of your surgeon and anesthesiologist your surgery may need to be rescheduled.      _X__ 1. Do not eat food after midnight the night before your procedure.                 No gum chewing or hard candies. You may drink clear liquids up to 2 hours                 before you are scheduled to arrive for your surgery- DO NOT drink clear                 liquids within 2 hours of the start of your surgery.                 Clear Liquids include:  water, apple juice without pulp, clear carbohydrate                 drink such as Clearfast or Gatorade, Black Coffee or Tea (Do not add                 anything to coffee or tea).   __X__2.  On the morning of surgery brush your teeth with toothpaste and water, you may rinse your mouth with mouthwash if you wish.  Do not swallow any toothpaste or mouthwash.     __X__3.  Notify your doctor if there is any change in your medical condition      (cold, fever, infections).     Do not wear jewelry, make-up, hairpins, clips or nail polish. Do not wear lotions, powders, or perfumes. You may wear your deodorant. Do not shave 48 hours prior to surgery. Men may shave face and neck. Do not bring valuables to the hospital.    [facility] is not responsible for any belongings or valuables.  Contacts, dentures/partials or body piercings may not be worn into surgery. Bring a case for your contacts, glasses or hearing aids, a denture cup will be supplied.   Leave your suitcase in the car. After surgery it may be brought to your room.   For patients admitted to the hospital, discharge time is determined by  your treatment team.    Patients discharged the day of surgery will not be allowed to drive home.    Please read over the following fact sheets that you were given:   MRSA Information    __X__ Take these medicines the morning of surgery with A SIP OF WATER:     1. liothyronine (CYTOMEL) 5 MCG tablet  2. metoprolol tartrate (LOPRESSOR) 50 MG tablet  3. allopurinol (ZYLOPRIM) 100 MG tablet  4. thyroid (ARMOUR) 30 MG tablet  5. oxyCODONE (OXY IR/ROXICODONE) 5 MG immediate release tablet (if needed)  6. colchicine 0.6 MG tablet (if needed)     __X__ Use CHG Soap as directed    __X__ Stop Blood Thinners Coumadin according to Dr. Alan Ripper instructions:  "Hold your Coumadin for 5 days starting on Sunday 02/24/18."    __X__ Stop Anti-inflammatories 7 days before surgery such as Advil, Ibuprofen, Motrin, BC or Goodies Powder, Naprosyn, Naproxen, Aleve, Aspirin, Meloxicam. May take Tylenol or  Oxycodone if needed for pain or discomfort.    __X__ Don't start taking any new herbal supplements prior to your surgery.

## 2018-02-22 DIAGNOSIS — M79675 Pain in left toe(s): Secondary | ICD-10-CM | POA: Diagnosis not present

## 2018-02-22 DIAGNOSIS — B351 Tinea unguium: Secondary | ICD-10-CM | POA: Diagnosis not present

## 2018-02-22 DIAGNOSIS — M79674 Pain in right toe(s): Secondary | ICD-10-CM | POA: Diagnosis not present

## 2018-02-22 DIAGNOSIS — L600 Ingrowing nail: Secondary | ICD-10-CM | POA: Diagnosis not present

## 2018-02-25 ENCOUNTER — Encounter
Admission: RE | Admit: 2018-02-25 | Discharge: 2018-02-25 | Disposition: A | Discharge status: Home / Self Care | Payer: Medicare Other | Source: Ambulatory Visit | Attending: Orthopedic Surgery | Admitting: Orthopedic Surgery

## 2018-02-25 LAB — TYPE AND SCREEN
ABO/RH(D): O POS
Antibody Screen: NEGATIVE

## 2018-02-25 LAB — URINALYSIS, ROUTINE W REFLEX MICROSCOPIC
BILIRUBIN URINE: NEGATIVE
Bacteria, UA: NONE SEEN
Glucose, UA: NEGATIVE mg/dL
Ketones, ur: NEGATIVE mg/dL
Leukocytes, UA: NEGATIVE
Nitrite: NEGATIVE
Protein, ur: NEGATIVE mg/dL
Specific Gravity, Urine: 1.006 (ref 1.005–1.030)
pH: 6 (ref 5.0–8.0)

## 2018-02-25 LAB — C-REACTIVE PROTEIN: CRP: <0.8 mg/dL (ref <1.0)

## 2018-02-25 LAB — SEDIMENTATION RATE: Sed Rate: 22 mm/hr (ref 0–30)

## 2018-02-25 NOTE — Pre-Procedure Instructions (Signed)
TELEPHONE NOTE FROM 02/14/18 CARDIOLOGIST STATES CLEARANCE AND DEVICE FORM SENT TO DR HOOTEN'S. LM FOR TIFFANY TO SEND TO PRE ADMIT

## 2018-02-26 LAB — URINE CULTURE
Culture: <10000 — AB
SPECIAL REQUESTS: NORMAL

## 2018-02-26 NOTE — Pre-Procedure Instructions (Signed)
UA results sent to Dr. Hooten for review. 

## 2018-02-28 ENCOUNTER — Other Ambulatory Visit: Payer: Self-pay

## 2018-02-28 ENCOUNTER — Ambulatory Visit: Payer: Medicare Other | Admitting: Nurse Practitioner

## 2018-02-28 VITALS — BP 149/55 | HR 53 | Temp 97.8°F | Resp 14 | Ht 61.0 in | Wt 191.0 lb

## 2018-02-28 DIAGNOSIS — Z79899 Other long term (current) drug therapy: Secondary | ICD-10-CM | POA: Insufficient documentation

## 2018-02-28 DIAGNOSIS — N183 Chronic kidney disease, stage 3 (moderate): Secondary | ICD-10-CM | POA: Insufficient documentation

## 2018-02-28 DIAGNOSIS — E785 Hyperlipidemia, unspecified: Secondary | ICD-10-CM | POA: Insufficient documentation

## 2018-02-28 DIAGNOSIS — M48061 Spinal stenosis, lumbar region without neurogenic claudication: Secondary | ICD-10-CM

## 2018-02-28 DIAGNOSIS — F4310 Post-traumatic stress disorder, unspecified: Secondary | ICD-10-CM

## 2018-02-28 DIAGNOSIS — M25552 Pain in left hip: Principal | ICD-10-CM

## 2018-02-28 DIAGNOSIS — E039 Hypothyroidism, unspecified: Secondary | ICD-10-CM

## 2018-02-28 DIAGNOSIS — K219 Gastro-esophageal reflux disease without esophagitis: Secondary | ICD-10-CM

## 2018-02-28 DIAGNOSIS — Z8249 Family history of ischemic heart disease and other diseases of the circulatory system: Secondary | ICD-10-CM | POA: Insufficient documentation

## 2018-02-28 DIAGNOSIS — J45909 Unspecified asthma, uncomplicated: Secondary | ICD-10-CM | POA: Insufficient documentation

## 2018-02-28 DIAGNOSIS — I480 Paroxysmal atrial fibrillation: Secondary | ICD-10-CM | POA: Insufficient documentation

## 2018-02-28 DIAGNOSIS — M47896 Other spondylosis, lumbar region: Secondary | ICD-10-CM | POA: Insufficient documentation

## 2018-02-28 DIAGNOSIS — Z7901 Long term (current) use of anticoagulants: Secondary | ICD-10-CM | POA: Insufficient documentation

## 2018-02-28 DIAGNOSIS — Z96642 Presence of left artificial hip joint: Secondary | ICD-10-CM

## 2018-02-28 DIAGNOSIS — G8929 Other chronic pain: Secondary | ICD-10-CM

## 2018-02-28 DIAGNOSIS — G894 Chronic pain syndrome: Secondary | ICD-10-CM

## 2018-02-28 DIAGNOSIS — M47816 Spondylosis without myelopathy or radiculopathy, lumbar region: Secondary | ICD-10-CM

## 2018-02-28 DIAGNOSIS — Z950 Presence of cardiac pacemaker: Secondary | ICD-10-CM

## 2018-02-28 DIAGNOSIS — I129 Hypertensive chronic kidney disease with stage 1 through stage 4 chronic kidney disease, or unspecified chronic kidney disease: Secondary | ICD-10-CM | POA: Insufficient documentation

## 2018-02-28 DIAGNOSIS — Z79891 Long term (current) use of opiate analgesic: Secondary | ICD-10-CM

## 2018-02-28 DIAGNOSIS — I495 Sick sinus syndrome: Secondary | ICD-10-CM | POA: Insufficient documentation

## 2018-02-28 DIAGNOSIS — Z6841 Body Mass Index (BMI) 40.0 and over, adult: Secondary | ICD-10-CM | POA: Insufficient documentation

## 2018-02-28 DIAGNOSIS — G4730 Sleep apnea, unspecified: Secondary | ICD-10-CM | POA: Insufficient documentation

## 2018-02-28 MED ORDER — OXYCODONE HCL 5 MG PO TABS: 5.0000 mg | ORAL_TABLET | Freq: Every day | ORAL | 0 refills | Status: DC | PRN

## 2018-02-28 MED ORDER — OXYCODONE HCL 5 MG PO TABS: 5.0000 mg | ORAL_TABLET | Freq: Every day | ORAL | 0 refills | Status: DC

## 2018-02-28 MED ORDER — TRANEXAMIC ACID-NACL 1000-0.7 MG/100ML-% IV SOLN
1000.0000 mg | INTRAVENOUS | Status: AC
  Administered 2018-03-01: 1000 mg via INTRAVENOUS
  Filled 2018-02-28: qty 100

## 2018-02-28 MED ORDER — CEFAZOLIN SODIUM-DEXTROSE 2-4 GM/100ML-% IV SOLN
2.0000 g | INTRAVENOUS | Status: AC
  Administered 2018-03-01: 2 g via INTRAVENOUS

## 2018-02-28 NOTE — Patient Instructions (Addendum)
____________________________________________________________________________________________  Medication Rules  Purpose: To inform patients, and their family members, of our rules and regulations.  Applies to: All patients receiving prescriptions (written or electronic).  Pharmacy of record: Pharmacy where electronic prescriptions will be sent. If written prescriptions are taken to a different pharmacy, please inform the nursing staff. The pharmacy listed in the electronic medical record should be the one where you would like electronic prescriptions to be sent.  Electronic prescriptions: In compliance with the [place] Strengthen Opioid Misuse Prevention (STOP) Act of 2017 (Session Law 2017-74/H243), effective February 27, 2018, all controlled substances must be electronically prescribed. Calling prescriptions to the pharmacy will cease to exist.  Prescription refills: Only during scheduled appointments. Applies to all prescriptions.  NOTE: The following applies primarily to controlled substances (Opioid* Pain Medications).   Patient's responsibilities: 1. Pain Pills: Bring all pain pills to every appointment (except for procedure appointments). 2. Pill Bottles: Bring pills in original pharmacy bottle. Always bring the newest bottle. Bring bottle, even if empty. 3. Medication refills: You are responsible for knowing and keeping track of what medications you take and those you need refilled. The day before your appointment: write a list of all prescriptions that need to be refilled. The day of the appointment: give the list to the admitting nurse. Prescriptions will be written only during appointments. If you forget a medication: it will not be "Called in", "Faxed", or "electronically sent". You will need to get another appointment to get these prescribed. No early refills. Do not call asking to have your prescription filled early. 4. Prescription Accuracy: You are responsible for  carefully inspecting your prescriptions before leaving our office. Have the discharge nurse carefully go over each prescription with you, before taking them home. Make sure that your name is accurately spelled, that your address is correct. Check the name and dose of your medication to make sure it is accurate. Check the number of pills, and the written instructions to make sure they are clear and accurate. Make sure that you are given enough medication to last until your next medication refill appointment. 5. Taking Medication: Take medication as prescribed. When it comes to controlled substances, taking less pills or less frequently than prescribed is permitted and encouraged. Never take more pills than instructed. Never take medication more frequently than prescribed.  6. Inform other Doctors: Always inform, all of your healthcare providers, of all the medications you take. 7. Pain Medication from other Providers: You are not allowed to accept any additional pain medication from any other Doctor or Healthcare provider. There are two exceptions to this rule. (see below) In the event that you require additional pain medication, you are responsible for notifying us, as stated below. 8. Medication Agreement: You are responsible for carefully reading and following our Medication Agreement. This must be signed before receiving any prescriptions from our practice. Safely store a copy of your signed Agreement. Violations to the Agreement will result in no further prescriptions. (Additional copies of our Medication Agreement are available upon request.) 9. Laws, Rules, & Regulations: All patients are expected to follow all Federal and State Laws, Statutes, Rules, & Regulations. Ignorance of the Laws does not constitute a valid excuse. The use of any illegal substances is prohibited. 10. Adopted CDC guidelines & recommendations: Target dosing levels will be at or below 60 MME/day. Use of benzodiazepines** is not  recommended.  Exceptions: There are only two exceptions to the rule of not receiving pain medications from other Healthcare Providers. 1.   Exception #1 (Emergencies): In the event of an emergency (i.e.: accident requiring emergency care), you are allowed to receive additional pain medication. However, you are responsible for: As soon as you are able, call our office [phone], at any time of the day or night, and leave a message stating your name, the date and nature of the emergency, and the name and dose of the medication prescribed. In the event that your call is answered by a member of our staff, make sure to document and save the date, time, and the name of the person that took your information.  2. Exception #2 (Planned Surgery): In the event that you are scheduled by another doctor or dentist to have any type of surgery or procedure, you are allowed (for a period no longer than 30 days), to receive additional pain medication, for the acute post-op pain. However, in this case, you are responsible for picking up a copy of our "Post-op Pain Management for Surgeons" handout, and giving it to your surgeon or dentist. This document is available at our office, and does not require an appointment to obtain it. Simply go to our office during business hours (Monday-Thursday from 8:00 AM to 4:00 PM) (Friday 8:00 AM to 12:00 Noon) or if you have a scheduled appointment with [place], prior to your surgery, and ask for it by name. In addition, you will need to provide [place] with your name, name of your surgeon, type of surgery, and date of procedure or surgery.  *Opioid medications include: morphine, codeine, oxycodone, oxymorphone, hydrocodone, hydromorphone, meperidine, tramadol, tapentadol, buprenorphine, fentanyl, methadone. **Benzodiazepine medications include: diazepam (Valium), alprazolam (Xanax), clonazepam (Klonopine), lorazepam (Ativan), clorazepate (Tranxene), chlordiazepoxide (Librium), estazolam (Prosom),  oxazepam (Serax), temazepam (Restoril), triazolam (Halcion) (Last updated: 04/26/2017) ____________________________________________________________________________________________   [name] indicates having an upcomming surgery. [name] has been provided today with a copy of the "Post-op Pain Management for Surgeons" handout with instructions to deliver it to the surgeon.  Three prescriptions for Oxycodone were sent to your pharmacy.

## 2018-02-28 NOTE — Progress Notes (Signed)
Patient's Name: [name]  MRN: [id number]  Referring Provider: Bryson Corona, NP  DOB: 08-20-1940  PCP: Bryson Corona, NP  DOS: 02/28/2018  Note by: Vevelyn Francois NP  Service setting: Ambulatory outpatient  Specialty: Interventional Pain Management  Location: [facility] (AMB) Pain Management Facility    Patient type: Established    Primary Reason(s) for Visit: Encounter for prescription drug management. (Level of risk: moderate)  CC: Hip Pain (left)  HPI  [name] is a 78 y.o. year old, female patient, who comes today for a medication management evaluation. She has HYPOTHYROIDISM; HYPERLIPIDEMIA; OBESITY; Essential hypertension; SICK SINUS SYNDROME; Cardiac pacemaker in situ; Cough; GERD (gastroesophageal reflux disease); Atrial fibrillation ([place]); Chronic hip pain (Location of Primary Source of Pain) (Left); Long term current use of opiate analgesic; Long term prescription opiate use; Opiate use (54 MME/Day); Encounter for therapeutic drug level monitoring; Disturbance of skin sensation; Osteoarthritis of hip (Left); Trochanteric bursitis of hip (Left); Lumbar central spinal stenosis (L4-5); Lumbar facet arthropathy ([place]); Osteoarthritis of sacroiliac joint (with vacuum phenomena in) (Bilateral); Avascular necrosis of left femoral head ([place]); Opioid-induced constipation ([facility]); Hypothyroidism (acquired); Long term (current) use of opiate analgesic; Morbid obesity with BMI of 40.0-44.9, adult ([place]); PTSD (post-traumatic stress disorder); Spinal stenosis of lumbar region; S/P total hip arthroplasty; Chronic pain syndrome; Status post total replacement of left hip; CKD (chronic kidney disease) stage 3, GFR 30-59 ml/min ([place]); Anemia of chronic disease; Hypocalcemia; Disorder of bone, unspecified; Other specified health status; Encounter for therapeutic drug monitoring; Sick sinus syndrome ([place]); and Hematuria on their problem list. Her primarily concern today is the Hip Pain (left)  Pain  Assessment: Location: Left Hip Radiating: left upper leg Onset: More than a month ago Duration: Chronic pain Quality: Stabbing, Constant Severity: 8 /10 (subjective, self-reported pain score)  Note: Reported level is compatible with observation. Clinically the patient looks like a 2/10 A 2/10 is viewed as "Mild to Moderate" and described as noticeable and distracting. Impossible to hide from other people. More frequent flare-ups. Still possible to adapt and function close to normal. It can be very annoying and may have occasional stronger flare-ups. With discipline, patients may get used to it and adapt. Information on the proper use of the pain scale provided to the patient today. When using our objective Pain Scale, levels between 6 and 10/10 are said to belong in an emergency room, as it progressively worsens from a 6/10, described as severely limiting, requiring emergency care not usually available at an outpatient pain management facility. At a 6/10 level, communication becomes difficult and requires great effort. Assistance to reach the emergency department may be required. Facial flushing and profuse sweating along with potentially dangerous increases in heart rate and blood pressure will be evident. Timing: Constant Modifying factors: heating pad, medications BP: (!) 149/55  HR: (!) 53  [name] was last scheduled for an appointment on 11/28/2017 for medication management. During today's appointment we reviewed [name]'s chronic pain status, as well as her outpatient medication regimen.  She is going to have revision surgery of her left hip tomorrow.  She denies any new concerns.  The patient  reports no history of drug use. Her body mass index is 36.09 kg/m.  Further details on both, my assessment(s), as well as the proposed treatment plan, please see below.  Controlled Substance Pharmacotherapy Assessment REMS (Risk Evaluation and Mitigation Strategy)  Analgesic:Oxycodone IR 5 mg 1  tablet by mouth 5 times a day (25 mg/day) MME/day:37.72m/day WDonneta Shaw  Emily Abts, RN  02/28/2018 10:32 AM  Sign when Signing Visit Nursing Pain Medication Assessment:  Safety precautions to be maintained throughout the outpatient stay will include: orient to surroundings, keep bed in low position, maintain call bell within reach at all times, provide assistance with transfer out of bed and ambulation.  Medication Inspection Compliance: Pill count conducted under aseptic conditions, in front of the patient. Neither the pills nor the bottle was removed from the patient's sight at any time. Once count was completed pills were immediately returned to the patient in their original bottle.  Medication: Oxycodone IR Pill/Patch Count: 31 of 150 pills remain Pill/Patch Appearance: Markings consistent with prescribed medication Bottle Appearance: Standard pharmacy container. Clearly labeled. Filled Date: 12/09 / 2019 Last Medication intake:  Today   Pharmacokinetics: Liberation and absorption (onset of action): WNL Distribution (time to peak effect): WNL Metabolism and excretion (duration of action): WNL         Pharmacodynamics: Desired effects: Analgesia: [name] reports >50% benefit. Functional ability: Patient reports that medication allows her to accomplish basic ADLs Clinically meaningful improvement in function (CMIF): Sustained CMIF goals met Perceived effectiveness: Described as relatively effective, allowing for increase in activities of daily living (ADL) Undesirable effects: Side-effects or Adverse reactions: None reported Monitoring: [place] PMP: Online review of the past 75-monthperiod conducted. Compliant with practice rules and regulations Last UDS on record: Summary  Date Value Ref Range Status  12/05/2016 FINAL  Final    Comment:    ==================================================================== TOXASSURE SELECT 13  (MW) ==================================================================== Test                             Result       Flag       Units Drug Present and Declared for Prescription Verification   Oxazepam                       30           EXPECTED   ng/mg creat   Temazepam                      17           EXPECTED   ng/mg creat    Oxazepam and temazepam are expected metabolites of diazepam.    Oxazepam is also an expected metabolite of other benzodiazepine    drugs, including chlordiazepoxide, prazepam, clorazepate,    halazepam, and temazepam.  Oxazepam and temazepam are available    as scheduled prescription medications.   Oxycodone                      567          EXPECTED   ng/mg creat   Oxymorphone                    148          EXPECTED   ng/mg creat   Noroxycodone                   1602         EXPECTED   ng/mg creat   Noroxymorphone                 2684         EXPECTED   ng/mg creat    Sources of oxycodone are scheduled prescription  medications.    Oxymorphone, noroxycodone, and noroxymorphone are expected    metabolites of oxycodone. Oxymorphone is also available as a    scheduled prescription medication. ==================================================================== Test                      Result    Flag   Units      Ref Range   Creatinine              141              mg/dL      >=20 ==================================================================== Declared Medications:  The flagging and interpretation on this report are based on the  following declared medications.  Unexpected results may arise from  inaccuracies in the declared medications.  **Note: The testing scope of this panel includes these medications:  Diazepam (Valium)  Oxycodone (Roxicodone)  **Note: The testing scope of this panel does not include following  reported medications:  Colchicine  Hydrochlorothiazide (Maxzide)  Levothyroxine  Metoprolol (Lopressor)  Supplement  Triamterene  (Maxzide)  Warfarin ==================================================================== For clinical consultation, please call [phone]. ====================================================================    UDS interpretation: Compliant          Medication Assessment Form: Reviewed. Patient indicates being compliant with therapy Treatment compliance: Compliant Risk Assessment Profile: Aberrant behavior: See prior evaluations. None observed or detected today Comorbid factors increasing risk of overdose: See prior notes. No additional risks detected today Opioid risk tool (ORT) (Total Score): 9 Personal History of Substance Abuse (SUD-Substance use disorder):  Alcohol: Negative  Illegal Drugs: Negative  Rx Drugs: Positive Female or Female  ORT Risk Level calculation: High Risk Risk of substance use disorder (SUD): Moderate Opioid Risk Tool - 02/28/18 1029      Family History of Substance Abuse   Alcohol  Negative    Illegal Drugs  Negative    Rx Drugs  Positive Female or Female      Personal History of Substance Abuse   Alcohol  Negative    Illegal Drugs  Negative    Rx Drugs  Positive Female or Female      Age   Age between 11-45 years   No      History of Preadolescent Sexual Abuse   History of Preadolescent Sexual Abuse  Negative or Female      Psychological Disease   Psychological Disease  Negative    Depression  Negative      Total Score   Opioid Risk Tool Scoring  9    Opioid Risk Interpretation  High Risk      ORT Scoring interpretation table:  Score <3 = Low Risk for SUD  Score between 4-7 = Moderate Risk for SUD  Score >8 = High Risk for Opioid Abuse   Risk Mitigation Strategies:  Patient Counseling: Covered Patient-Prescriber Agreement (PPA): Present and active  Notification to other healthcare providers: Done  Pharmacologic Plan: No change in therapy, at this time.             Laboratory Chemistry  Inflammation Markers (CRP: Acute Phase) (ESR:  Chronic Phase) Lab Results  Component Value Date   CRP <0.8 02/25/2018   ESRSEDRATE 22 02/25/2018                         Rheumatology Markers Lab Results  Component Value Date   LABURIC 7.0 10/16/2017  Renal Function Markers Lab Results  Component Value Date   BUN 27 (H) 02/21/2018   CREATININE 1.06 (H) 02/21/2018   BCR 25 03/07/2017   GFRAA 58 (L) 02/21/2018   GFRNONAA 50 (L) 02/21/2018                             Hepatic Function Markers Lab Results  Component Value Date   AST 24 02/21/2018   ALT 13 02/21/2018   ALBUMIN 3.8 02/21/2018   ALKPHOS 64 02/21/2018                        Electrolytes Lab Results  Component Value Date   NA 137 02/21/2018   K 4.0 02/21/2018   CL 106 02/21/2018   CALCIUM 9.4 02/21/2018   MG 2.3 03/07/2017                        Neuropathy Markers Lab Results  Component Value Date   VITAMINB12 661 03/07/2017                        CNS Tests No results found for: COLORCSF, APPEARCSF, RBCCOUNTCSF, WBCCSF, POLYSCSF, LYMPHSCSF, EOSCSF, PROTEINCSF, GLUCCSF, JCVIRUS, CSFOLI, IGGCSF                      Bone Pathology Markers Lab Results  Component Value Date   25OHVITD1 39 03/07/2017   25OHVITD2 5.6 03/07/2017   25OHVITD3 33 03/07/2017                         Coagulation Parameters Lab Results  Component Value Date   INR 1.99 02/21/2018   LABPROT 22.3 (H) 02/21/2018   APTT 36 02/21/2018   PLT 174 02/21/2018                        Cardiovascular Markers Lab Results  Component Value Date   TROPONINI 0.04        NO INDICATION OF MYOCARDIAL INJURY. 05/22/2009   HGB 14.2 02/21/2018   HCT 44.5 02/21/2018                         CA Markers No results found for: CEA, CA125, LABCA2                      Note: Lab results reviewed.  Recent Diagnostic Imaging Results  CUP PACEART INCLINIC DEVICE CHECK Pacemaker check in clinic. Normal device function. Thresholds, sensing, impedances consistent with  previous measurements. Device programmed to maximize longevity. Permanent AF, +warfarin. No episode triggers enabled. Device programmed at appropriate  safety margins. Histogram distribution appropriate for patient activity level. Device programmed to optimize intrinsic conduction. Estimated longevity 1.75 years. Patient enrolled in TTM's with [employer]. Patient education completed. Patient requests  transfer to [facility] as she recently moved to [place]. Advised I will route message to [facility] to request transfer. Plan for ROV with [facility] in 02/2018.Levander Campion BSN, RN, CCDS  Complexity Note: Imaging results reviewed. Results shared with Ms. Blanchet, using Layman's terms.                         Meds   Current Outpatient Medications:  .  allopurinol (ZYLOPRIM) 100 MG tablet, Take 1 tablet  by mouth daily. , Disp: , Rfl:  .  colchicine 0.6 MG tablet, Take 0.6 mg by mouth daily as needed (GOUT). , Disp: , Rfl:  .  diazepam (VALIUM) 10 MG tablet, Take 10 mg by mouth 2 (two) times daily as needed for anxiety., Disp: , Rfl:  .  liothyronine (CYTOMEL) 5 MCG tablet, Take 15 mcg by mouth daily. , Disp: , Rfl:  .  metoprolol tartrate (LOPRESSOR) 50 MG tablet, Take 75 mg by mouth 2 (two) times daily., Disp: , Rfl:  .  spironolactone (ALDACTONE) 100 MG tablet, Take 100 mg by mouth daily., Disp: , Rfl:  .  thyroid (ARMOUR) 30 MG tablet, Take 30 mg by mouth daily before breakfast., Disp: , Rfl:  .  warfarin (COUMADIN) 2.5 MG tablet, Take 1 tablet (2.5 mg total) by mouth daily., Disp: 100 tablet, Rfl: 1 .  [START ON 05/01/2018] oxyCODONE (OXY IR/ROXICODONE) 5 MG immediate release tablet, Take 1 tablet (5 mg total) by mouth 5 (five) times daily as needed for severe pain., Disp: 150 tablet, Rfl: 0 .  [START ON 04/01/2018] oxyCODONE (OXY IR/ROXICODONE) 5 MG immediate release tablet, Take 1 tablet (5 mg total) by mouth 5 (five) times daily as needed for severe pain., Disp: 150 tablet, Rfl: 0 .  [START ON 03/02/2018] oxyCODONE (OXY  IR/ROXICODONE) 5 MG immediate release tablet, Take 1 tablet (5 mg total) by mouth 5 (five) times daily., Disp: 150 tablet, Rfl: 0  ROS  Constitutional: Denies any fever or chills Gastrointestinal: No reported hemesis, hematochezia, vomiting, or acute GI distress Musculoskeletal: Denies any acute onset joint swelling, redness, loss of ROM, or weakness Neurological: No reported episodes of acute onset apraxia, aphasia, dysarthria, agnosia, amnesia, paralysis, loss of coordination, or loss of consciousness  Allergies  Ms. Marshburn is allergic to contrast media [iodinated diagnostic agents].  PFSH  Drug: Ms. Meinhardt  reports no history of drug use. Alcohol:  reports no history of alcohol use. Tobacco:  reports that she has never smoked. She has never used smokeless tobacco. Medical:  has a past medical history of Anxiety, Aortic insufficiency, Arthritis, Asthma, Bronchitis, Bursitis, hip, Claustrophobia, Claustrophobia, Dysrhythmia, GERD (gastroesophageal reflux disease), Gout, Hyperlipidemia, Hypertension, Hypothyroidism, Mitral regurgitation, Obesity, Panic attacks, Paroxysmal atrial fibrillation ([place]), PTSD (post-traumatic stress disorder), Rhinitis, Sick sinus syndrome ([place]), and Sleep apnea. Surgical: Ms. Klepper  has a past surgical history that includes Tonsillectomy; Pacemaker insertion; Status post child birth x1; Rotator cuff repair; Dilatation & curettage/hysteroscopy with myosure (N/A, 08/03/2015); Insert / replace / remove pacemaker; Total hip arthroplasty (Left, 12/22/2015); and Cardiac catheterization. Family: family history includes Alzheimer's disease in her father; Arthritis in her sister; Cancer in her brother and maternal grandfather; Heart disease in her mother; Hypertension in her sister; Stroke in her mother.  Constitutional Exam  General appearance: Well nourished, well developed, and well hydrated. In no apparent acute distress Vitals:   02/28/18 1025  BP: (!) 149/55  Pulse: (!)  53  Resp: 14  Temp: 97.8 F (36.6 C)  TempSrc: Oral  SpO2: 100%  Weight: 191 lb (86.6 kg)  Height: 5' 1"  (1.549 m)  Psych/Mental status: Alert, oriented x 3 (person, place, & time)       Eyes: PERLA Respiratory: No evidence of acute respiratory distress  Lumbar Spine Area Exam  Skin & Axial Inspection: No masses, redness, or swelling Alignment: Symmetrical Functional ROM: Unrestricted ROM       Stability: No instability detected Muscle Tone/Strength: Functionally intact. No obvious neuro-muscular anomalies detected. Sensory (Neurological):  Unimpaired Palpation: No palpable anomalies         Gait & Posture Assessment  Ambulation: Patient ambulates using a cane Gait: Relatively normal for age and body habitus Posture: WNL   Lower Extremity Exam    Side: Right lower extremity  Side: Left lower extremity  Stability: No instability observed          Stability: No instability observed          Skin & Extremity Inspection: Skin color, temperature, and hair growth are WNL. No peripheral edema or cyanosis. No masses, redness, swelling, asymmetry, or associated skin lesions. No contractures.  Skin & Extremity Inspection: Evidence of prior arthroplastic surgery  Functional ROM: Unrestricted ROM                  Functional ROM: Guarding                  Muscle Tone/Strength: Functionally intact. No obvious neuro-muscular anomalies detected.  Muscle Tone/Strength: Functionally intact. No obvious neuro-muscular anomalies detected.  Sensory (Neurological): Unimpaired        Sensory (Neurological): Unimpaired            Palpation: No palpable anomalies  Palpation: No palpable anomalies   Assessment  Primary Diagnosis & Pertinent Problem List: The primary encounter diagnosis was Chronic hip pain (Location of Primary Source of Pain) (Left). Diagnoses of Lumbar facet arthropathy ([place]), Spinal stenosis of lumbar region, unspecified whether neurogenic claudication present, and Chronic pain  syndrome were also pertinent to this visit.  Status Diagnosis  Worsening Persistent Persistent 1. Chronic hip pain (Location of Primary Source of Pain) (Left)   2. Lumbar facet arthropathy ([facility])   3. Spinal stenosis of lumbar region, unspecified whether neurogenic claudication present   4. Chronic pain syndrome     Problems updated and reviewed during this visit: No problems updated. Plan of Care  Pharmacotherapy (Medications Ordered): Meds ordered this encounter  Medications  . oxyCODONE (OXY IR/ROXICODONE) 5 MG immediate release tablet    Sig: Take 1 tablet (5 mg total) by mouth 5 (five) times daily as needed for severe pain.    Dispense:  150 tablet    Refill:  0    Do not add this medication to the electronic "Automatic Refill" notification system. Patient may have prescription filled one day early if pharmacy is closed on scheduled refill date.    Order Specific Question:   Supervising Provider    Answer:   Milinda Pointer [phone]  . oxyCODONE (OXY IR/ROXICODONE) 5 MG immediate release tablet    Sig: Take 1 tablet (5 mg total) by mouth 5 (five) times daily as needed for severe pain.    Dispense:  150 tablet    Refill:  0    Do not add this medication to the electronic "Automatic Refill" notification system. Patient may have prescription filled one day early if pharmacy is closed on scheduled refill date.    Order Specific Question:   Supervising Provider    Answer:   Milinda Pointer [phone]  . oxyCODONE (OXY IR/ROXICODONE) 5 MG immediate release tablet    Sig: Take 1 tablet (5 mg total) by mouth 5 (five) times daily.    Dispense:  150 tablet    Refill:  0    Do not place this medication, or any other prescription from our practice, on "Automatic Refill". Patient may have prescription filled one day early if pharmacy is closed on scheduled refill date.    Order Specific  Question:   Supervising Provider    Answer:   Milinda Pointer [phone]   New Prescriptions    No medications on file   Medications administered today: Arlayne Santaella had no medications administered during this visit. Lab-work, procedure(s), and/or referral(s): No orders of the defined types were placed in this encounter.  Imaging and/or referral(s): None  Interventional therapies: Planned, scheduled, and/or pending:   None at this time.    Considering:   None at this time.    Palliative PRN treatment(s):   None at this time.     Provider-requested follow-up: No follow-ups on file.  Future Appointments  Date Time Provider [place]  03/26/2018 10:45 AM Deboraha Sprang, MD [facility]  05/28/2018 10:30 AM Vevelyn Francois, NP [facility] None   Primary Care Physician: Bryson Corona, NP Location: [facility] Note by: Vevelyn Francois NP Date: 02/28/2018; Time: 12:53 PM  Pain Score Disclaimer: We use the NRS-11 scale. This is a self-reported, subjective measurement of pain severity with only modest accuracy. It is used primarily to identify changes within a particular patient. It must be understood that outpatient pain scales are significantly less accurate that those used for research, where they can be applied under ideal controlled circumstances with minimal exposure to variables. In reality, the score is likely to be a combination of pain intensity and pain affect, where pain affect describes the degree of emotional arousal or changes in action readiness caused by the sensory experience of pain. Factors such as social and work situation, setting, emotional state, anxiety levels, expectation, and prior pain experience may influence pain perception and show large inter-individual differences that may also be affected by time variables.  Patient instructions provided during this appointment: Patient Instructions  ____________________________________________________________________________________________  Medication  Rules  Purpose: To inform patients, and their family members, of our rules and regulations.  Applies to: All patients receiving prescriptions (written or electronic).  Pharmacy of record: Pharmacy where electronic prescriptions will be sent. If written prescriptions are taken to a different pharmacy, please inform the nursing staff. The pharmacy listed in the electronic medical record should be the one where you would like electronic prescriptions to be sent.  Electronic prescriptions: In compliance with the [place] (STOP) Act of 2017 (Session Lanny Cramp [phone]), effective February 27, 2018, all controlled substances must be electronically prescribed. Calling prescriptions to the pharmacy will cease to exist.  Prescription refills: Only during scheduled appointments. Applies to all prescriptions.  NOTE: The following applies primarily to controlled substances (Opioid* Pain Medications).   Patient's responsibilities: 1. Pain Pills: Bring all pain pills to every appointment (except for procedure appointments). 2. Pill Bottles: Bring pills in original pharmacy bottle. Always bring the newest bottle. Bring bottle, even if empty. 3. Medication refills: You are responsible for knowing and keeping track of what medications you take and those you need refilled. The day before your appointment: write a list of all prescriptions that need to be refilled. The day of the appointment: give the list to the admitting nurse. Prescriptions will be written only during appointments. If you forget a medication: it will not be "Called in", "Faxed", or "electronically sent". You will need to get another appointment to get these prescribed. No early refills. Do not call asking to have your prescription filled early. 4. Prescription Accuracy: You are responsible for carefully inspecting your prescriptions before leaving our office. Have the discharge nurse carefully go over each  prescription with  you, before taking them home. Make sure that your name is accurately spelled, that your address is correct. Check the name and dose of your medication to make sure it is accurate. Check the number of pills, and the written instructions to make sure they are clear and accurate. Make sure that you are given enough medication to last until your next medication refill appointment. 5. Taking Medication: Take medication as prescribed. When it comes to controlled substances, taking less pills or less frequently than prescribed is permitted and encouraged. Never take more pills than instructed. Never take medication more frequently than prescribed.  6. Inform other Doctors: Always inform, all of your healthcare providers, of all the medications you take. 7. Pain Medication from other Providers: You are not allowed to accept any additional pain medication from any other Doctor or Healthcare provider. There are two exceptions to this rule. (see below) In the event that you require additional pain medication, you are responsible for notifying us, as stated below. 8. Medication Agreement: You are responsible for carefully reading and following our Medication Agreement. This must be signed before receiving any prescriptions from our practice. Safely store a copy of your signed Agreement. Violations to the Agreement will result in no further prescriptions. (Additional copies of our Medication Agreement are available upon request.) 9. Laws, Rules, & Regulations: All patients are expected to follow all Federal and [employer], [employer], Rules, [employer] [facility]. Ignorance of the Laws does not constitute a valid excuse. The use of any illegal substances is prohibited. 10. Adopted CDC guidelines & recommendations: Target dosing levels will be at or below 60 MME/day. Use of benzodiazepines** is not recommended.  Exceptions: There are only two exceptions to the rule of not receiving pain medications from other  Healthcare Providers. 1. Exception #1 (Emergencies): In the event of an emergency (i.e.: accident requiring emergency care), you are allowed to receive additional pain medication. However, you are responsible for: As soon as you are able, call our office [phone], at any time of the day or night, and leave a message stating your name, the date and nature of the emergency, and the name and dose of the medication prescribed. In the event that your call is answered by a member of our staff, make sure to document and save the date, time, and the name of the person that took your information.  2. Exception #2 (Planned Surgery): In the event that you are scheduled by another doctor or dentist to have any type of surgery or procedure, you are allowed (for a period no longer than 30 days), to receive additional pain medication, for the acute post-op pain. However, in this case, you are responsible for picking up a copy of our "Post-op Pain Management for Surgeons" handout, and giving it to your surgeon or dentist. This document is available at our office, and does not require an appointment to obtain it. Simply go to our office during business hours (Monday-Thursday from 8:00 AM to 4:00 PM) (Friday 8:00 AM to 12:00 Noon) or if you have a scheduled appointment with [place], prior to your surgery, and ask for it by name. In addition, you will need to provide [place] with your name, name of your surgeon, type of surgery, and date of procedure or surgery.  *Opioid medications include: morphine, codeine, oxycodone, oxymorphone, hydrocodone, hydromorphone, meperidine, tramadol, tapentadol, buprenorphine, fentanyl, methadone. **Benzodiazepine medications include: diazepam (Valium), alprazolam (Xanax), clonazepam (Klonopine), lorazepam (Ativan), clorazepate (Tranxene), chlordiazepoxide (Librium), estazolam (Prosom), oxazepam (Serax), temazepam (Restoril), triazolam (  Halcion) (Last updated:  04/26/2017) ____________________________________________________________________________________________   Ms. Doiron indicates having an upcomming surgery. Ms. Bloodworth has been provided today with a copy of the "Post-op Pain Management for Surgeons" handout with instructions to deliver it to the surgeon.  Three prescriptions for Oxycodone were sent to your pharmacy.

## 2018-02-28 NOTE — Progress Notes (Signed)
Nursing Pain Medication Assessment:  Safety precautions to be maintained throughout the outpatient stay will include: orient to surroundings, keep bed in low position, maintain call bell within reach at all times, provide assistance with transfer out of bed and ambulation.  Medication Inspection Compliance: Pill count conducted under aseptic conditions, in front of the patient. Neither the pills nor the bottle was removed from the patient's sight at any time. Once count was completed pills were immediately returned to the patient in their original bottle.  Medication: Oxycodone IR Pill/Patch Count: 31 of 150 pills remain Pill/Patch Appearance: Markings consistent with prescribed medication Bottle Appearance: Standard pharmacy container. Clearly labeled. Filled Date: 12/09 / 2019 Last Medication intake:  Today

## 2018-03-01 ENCOUNTER — Encounter
Admission: RE | Disposition: A | Discharge status: Home Health | Payer: Self-pay | Source: Home / Self Care | Attending: Orthopedic Surgery

## 2018-03-01 ENCOUNTER — Encounter: Payer: Self-pay | Admitting: Orthopedic Surgery

## 2018-03-01 ENCOUNTER — Inpatient Hospital Stay: Payer: Medicare Other | Admitting: Anesthesiology

## 2018-03-01 ENCOUNTER — Inpatient Hospital Stay
Admission: RE | Admit: 2018-03-01 | Discharge: 2018-03-03 | DRG: 468 | Disposition: A | Discharge status: Home Health | Payer: Medicare Other | Attending: Orthopedic Surgery | Admitting: Orthopedic Surgery

## 2018-03-01 ENCOUNTER — Inpatient Hospital Stay: Payer: Medicare Other

## 2018-03-01 ENCOUNTER — Other Ambulatory Visit: Payer: Self-pay

## 2018-03-01 DIAGNOSIS — Z79899 Other long term (current) drug therapy: Secondary | ICD-10-CM | POA: Diagnosis not present

## 2018-03-01 DIAGNOSIS — I080 Rheumatic disorders of both mitral and aortic valves: Secondary | ICD-10-CM | POA: Diagnosis present

## 2018-03-01 DIAGNOSIS — M109 Gout, unspecified: Secondary | ICD-10-CM | POA: Diagnosis present

## 2018-03-01 DIAGNOSIS — Z91041 Radiographic dye allergy status: Secondary | ICD-10-CM | POA: Diagnosis not present

## 2018-03-01 DIAGNOSIS — I495 Sick sinus syndrome: Secondary | ICD-10-CM | POA: Diagnosis present

## 2018-03-01 DIAGNOSIS — F4310 Post-traumatic stress disorder, unspecified: Secondary | ICD-10-CM | POA: Diagnosis present

## 2018-03-01 DIAGNOSIS — F410 Panic disorder [episodic paroxysmal anxiety] without agoraphobia: Secondary | ICD-10-CM | POA: Diagnosis present

## 2018-03-01 DIAGNOSIS — Z7901 Long term (current) use of anticoagulants: Secondary | ICD-10-CM

## 2018-03-01 DIAGNOSIS — I480 Paroxysmal atrial fibrillation: Secondary | ICD-10-CM | POA: Diagnosis present

## 2018-03-01 DIAGNOSIS — Z471 Aftercare following joint replacement surgery: Secondary | ICD-10-CM | POA: Diagnosis not present

## 2018-03-01 DIAGNOSIS — G4730 Sleep apnea, unspecified: Secondary | ICD-10-CM | POA: Diagnosis present

## 2018-03-01 DIAGNOSIS — K219 Gastro-esophageal reflux disease without esophagitis: Secondary | ICD-10-CM | POA: Diagnosis present

## 2018-03-01 DIAGNOSIS — Z888 Allergy status to other drugs, medicaments and biological substances status: Secondary | ICD-10-CM | POA: Diagnosis not present

## 2018-03-01 DIAGNOSIS — Z6836 Body mass index (BMI) 36.0-36.9, adult: Secondary | ICD-10-CM

## 2018-03-01 DIAGNOSIS — E669 Obesity, unspecified: Secondary | ICD-10-CM | POA: Diagnosis present

## 2018-03-01 DIAGNOSIS — F419 Anxiety disorder, unspecified: Secondary | ICD-10-CM | POA: Diagnosis present

## 2018-03-01 DIAGNOSIS — T84031A Mechanical loosening of internal left hip prosthetic joint, initial encounter: Principal | ICD-10-CM | POA: Diagnosis present

## 2018-03-01 DIAGNOSIS — F40240 Claustrophobia: Secondary | ICD-10-CM | POA: Diagnosis present

## 2018-03-01 DIAGNOSIS — E785 Hyperlipidemia, unspecified: Secondary | ICD-10-CM | POA: Diagnosis present

## 2018-03-01 DIAGNOSIS — Y792 Prosthetic and other implants, materials and accessory orthopedic devices associated with adverse incidents: Secondary | ICD-10-CM | POA: Diagnosis present

## 2018-03-01 DIAGNOSIS — I10 Essential (primary) hypertension: Secondary | ICD-10-CM | POA: Diagnosis present

## 2018-03-01 DIAGNOSIS — Z79890 Hormone replacement therapy (postmenopausal): Secondary | ICD-10-CM

## 2018-03-01 DIAGNOSIS — I129 Hypertensive chronic kidney disease with stage 1 through stage 4 chronic kidney disease, or unspecified chronic kidney disease: Secondary | ICD-10-CM | POA: Diagnosis not present

## 2018-03-01 DIAGNOSIS — J45909 Unspecified asthma, uncomplicated: Secondary | ICD-10-CM | POA: Diagnosis present

## 2018-03-01 DIAGNOSIS — T84039A Mechanical loosening of unspecified internal prosthetic joint, initial encounter: Secondary | ICD-10-CM | POA: Diagnosis not present

## 2018-03-01 DIAGNOSIS — E039 Hypothyroidism, unspecified: Secondary | ICD-10-CM | POA: Diagnosis present

## 2018-03-01 DIAGNOSIS — Z79891 Long term (current) use of opiate analgesic: Secondary | ICD-10-CM | POA: Diagnosis not present

## 2018-03-01 DIAGNOSIS — Z96642 Presence of left artificial hip joint: Secondary | ICD-10-CM | POA: Diagnosis not present

## 2018-03-01 DIAGNOSIS — Y838 Other surgical procedures as the cause of abnormal reaction of the patient, or of later complication, without mention of misadventure at the time of the procedure: Secondary | ICD-10-CM | POA: Diagnosis present

## 2018-03-01 DIAGNOSIS — N183 Chronic kidney disease, stage 3 (moderate): Secondary | ICD-10-CM | POA: Diagnosis not present

## 2018-03-01 DIAGNOSIS — M1990 Unspecified osteoarthritis, unspecified site: Secondary | ICD-10-CM | POA: Diagnosis present

## 2018-03-01 DIAGNOSIS — Z96649 Presence of unspecified artificial hip joint: Secondary | ICD-10-CM

## 2018-03-01 HISTORY — PX: TOTAL HIP REVISION: SHX763

## 2018-03-01 LAB — CBC
HEMATOCRIT: 47.2 % — AB (ref 36.0–46.0)
Hemoglobin: 14.3 g/dL (ref 12.0–15.0)
MCH: 31.2 pg (ref 26.0–34.0)
MCHC: 30.3 g/dL (ref 30.0–36.0)
MCV: 103.1 fL — ABNORMAL HIGH (ref 80.0–100.0)
Platelets: 126 10*3/uL — ABNORMAL LOW (ref 150–400)
RBC: 4.58 MIL/uL (ref 3.87–5.11)
RDW: 13.9 % (ref 11.5–15.5)
WBC: 6.9 10*3/uL (ref 4.0–10.5)
nRBC: 0 % (ref 0.0–0.2)

## 2018-03-01 LAB — PROTIME-INR
INR: 1.07
Prothrombin Time: 13.8 seconds (ref 11.4–15.2)

## 2018-03-01 SURGERY — TOTAL HIP REVISION
Anesthesia: Spinal | Site: Hip | Laterality: Left

## 2018-03-01 MED ORDER — ACETAMINOPHEN 10 MG/ML IV SOLN
INTRAVENOUS | Status: DC | PRN
  Administered 2018-03-01: 1000 mg via INTRAVENOUS

## 2018-03-01 MED ORDER — SODIUM CHLORIDE 0.9 % IV SOLN
INTRAVENOUS | Status: DC | PRN
  Administered 2018-03-01: 15 ug/min via INTRAVENOUS

## 2018-03-01 MED ORDER — METOCLOPRAMIDE HCL 10 MG PO TABS
10.0000 mg | ORAL_TABLET | Freq: Three times a day (TID) | ORAL | Status: DC
  Administered 2018-03-01 – 2018-03-03 (×6): 10 mg via ORAL
  Filled 2018-03-01 (×7): qty 1

## 2018-03-01 MED ORDER — BISACODYL 10 MG RE SUPP: 10.0000 mg | Freq: Every day | RECTAL | Status: DC | PRN

## 2018-03-01 MED ORDER — MENTHOL 3 MG MT LOZG
1.0000 | LOZENGE | OROMUCOSAL | Status: DC | PRN
  Filled 2018-03-01: qty 9

## 2018-03-01 MED ORDER — TETRACAINE HCL 1 % IJ SOLN
INTRAMUSCULAR | Status: AC
  Filled 2018-03-01: qty 2

## 2018-03-01 MED ORDER — SPIRONOLACTONE 25 MG PO TABS
100.0000 mg | ORAL_TABLET | Freq: Every day | ORAL | Status: DC
  Administered 2018-03-01 – 2018-03-03 (×3): 100 mg via ORAL
  Filled 2018-03-01 (×3): qty 4

## 2018-03-01 MED ORDER — PROMETHAZINE HCL 25 MG/ML IJ SOLN
6.2500 mg | INTRAMUSCULAR | Status: DC | PRN
  Administered 2018-03-01: 6.25 mg via INTRAVENOUS

## 2018-03-01 MED ORDER — WARFARIN SODIUM 4 MG PO TABS
4.0000 mg | ORAL_TABLET | Freq: Once | ORAL | Status: AC
  Administered 2018-03-01: 4 mg via ORAL
  Filled 2018-03-01: qty 1

## 2018-03-01 MED ORDER — TRAMADOL HCL 50 MG PO TABS
50.0000 mg | ORAL_TABLET | ORAL | Status: DC | PRN
  Administered 2018-03-02: 50 mg via ORAL
  Filled 2018-03-01: qty 1

## 2018-03-01 MED ORDER — PROPOFOL 500 MG/50ML IV EMUL
INTRAVENOUS | Status: AC
  Filled 2018-03-01: qty 50

## 2018-03-01 MED ORDER — PHENOL 1.4 % MT LIQD
1.0000 | OROMUCOSAL | Status: DC | PRN
  Filled 2018-03-01: qty 177

## 2018-03-01 MED ORDER — GABAPENTIN 300 MG PO CAPS
300.0000 mg | ORAL_CAPSULE | Freq: Every day | ORAL | Status: DC
  Administered 2018-03-01 – 2018-03-02 (×2): 300 mg via ORAL
  Filled 2018-03-01 (×2): qty 1

## 2018-03-01 MED ORDER — METOPROLOL TARTRATE 50 MG PO TABS
75.0000 mg | ORAL_TABLET | Freq: Two times a day (BID) | ORAL | Status: DC
  Administered 2018-03-02 – 2018-03-03 (×3): 75 mg via ORAL
  Filled 2018-03-01 (×5): qty 1

## 2018-03-01 MED ORDER — OXYCODONE HCL 5 MG PO TABS: 10.0000 mg | ORAL_TABLET | ORAL | Status: DC | PRN

## 2018-03-01 MED ORDER — CHLORHEXIDINE GLUCONATE 4 % EX LIQD: 60.0000 mL | Freq: Once | CUTANEOUS | Status: DC

## 2018-03-01 MED ORDER — FERROUS SULFATE 325 (65 FE) MG PO TABS
325.0000 mg | ORAL_TABLET | Freq: Two times a day (BID) | ORAL | Status: DC
  Administered 2018-03-02 – 2018-03-03 (×3): 325 mg via ORAL
  Filled 2018-03-01 (×3): qty 1

## 2018-03-01 MED ORDER — PROPOFOL 10 MG/ML IV BOLUS
INTRAVENOUS | Status: DC | PRN
  Administered 2018-03-01: 50 mg via INTRAVENOUS

## 2018-03-01 MED ORDER — PROPOFOL 500 MG/50ML IV EMUL
INTRAVENOUS | Status: DC | PRN
  Administered 2018-03-01: 60 ug/kg/min via INTRAVENOUS

## 2018-03-01 MED ORDER — ONDANSETRON HCL 4 MG PO TABS: 4.0000 mg | ORAL_TABLET | Freq: Four times a day (QID) | ORAL | Status: DC | PRN

## 2018-03-01 MED ORDER — FENTANYL CITRATE (PF) 100 MCG/2ML IJ SOLN
INTRAMUSCULAR | Status: AC
  Filled 2018-03-01: qty 2

## 2018-03-01 MED ORDER — ONDANSETRON HCL 4 MG/2ML IJ SOLN: 4.0000 mg | Freq: Four times a day (QID) | INTRAMUSCULAR | Status: DC | PRN

## 2018-03-01 MED ORDER — NEOMYCIN-POLYMYXIN B GU 40-200000 IR SOLN
Status: DC | PRN
  Administered 2018-03-01: 14 mL

## 2018-03-01 MED ORDER — PANTOPRAZOLE SODIUM 40 MG PO TBEC
40.0000 mg | DELAYED_RELEASE_TABLET | Freq: Two times a day (BID) | ORAL | Status: DC
  Administered 2018-03-01 – 2018-03-03 (×4): 40 mg via ORAL
  Filled 2018-03-01 (×4): qty 1

## 2018-03-01 MED ORDER — SENNOSIDES-DOCUSATE SODIUM 8.6-50 MG PO TABS
1.0000 | ORAL_TABLET | Freq: Two times a day (BID) | ORAL | Status: DC
  Administered 2018-03-02 – 2018-03-03 (×3): 1 via ORAL
  Filled 2018-03-01 (×4): qty 1

## 2018-03-01 MED ORDER — ALLOPURINOL 100 MG PO TABS
100.0000 mg | ORAL_TABLET | Freq: Every day | ORAL | Status: DC
  Administered 2018-03-02 – 2018-03-03 (×2): 100 mg via ORAL
  Filled 2018-03-01 (×3): qty 1

## 2018-03-01 MED ORDER — CELECOXIB 200 MG PO CAPS
200.0000 mg | ORAL_CAPSULE | Freq: Two times a day (BID) | ORAL | Status: DC
  Administered 2018-03-01 – 2018-03-03 (×4): 200 mg via ORAL
  Filled 2018-03-01 (×4): qty 1

## 2018-03-01 MED ORDER — FENTANYL CITRATE (PF) 100 MCG/2ML IJ SOLN
INTRAMUSCULAR | Status: AC
  Administered 2018-03-01: 25 ug via INTRAVENOUS
  Filled 2018-03-01: qty 2

## 2018-03-01 MED ORDER — BUPIVACAINE HCL (PF) 0.5 % IJ SOLN
INTRAMUSCULAR | Status: DC | PRN
  Administered 2018-03-01: 2 mL

## 2018-03-01 MED ORDER — FAMOTIDINE 20 MG PO TABS
20.0000 mg | ORAL_TABLET | Freq: Once | ORAL | Status: AC
  Administered 2018-03-01: 20 mg via ORAL

## 2018-03-01 MED ORDER — PHENYLEPHRINE HCL 10 MG/ML IJ SOLN
INTRAMUSCULAR | Status: DC | PRN
  Administered 2018-03-01: 100 ug via INTRAVENOUS
  Administered 2018-03-01: 150 ug via INTRAVENOUS
  Administered 2018-03-01 (×2): 100 ug via INTRAVENOUS
  Administered 2018-03-01: 50 ug via INTRAVENOUS
  Administered 2018-03-01 (×2): 100 ug via INTRAVENOUS
  Administered 2018-03-01: 150 ug via INTRAVENOUS

## 2018-03-01 MED ORDER — EPHEDRINE SULFATE 50 MG/ML IJ SOLN
INTRAMUSCULAR | Status: DC | PRN
  Administered 2018-03-01 (×3): 10 mg via INTRAVENOUS

## 2018-03-01 MED ORDER — OXYCODONE HCL 5 MG PO TABS
5.0000 mg | ORAL_TABLET | ORAL | Status: DC | PRN
  Administered 2018-03-01 – 2018-03-03 (×5): 5 mg via ORAL
  Filled 2018-03-01 (×5): qty 1

## 2018-03-01 MED ORDER — SODIUM CHLORIDE FLUSH 0.9 % IV SOLN
INTRAVENOUS | Status: AC
  Filled 2018-03-01: qty 10

## 2018-03-01 MED ORDER — DEXAMETHASONE SODIUM PHOSPHATE 10 MG/ML IJ SOLN
8.0000 mg | Freq: Once | INTRAMUSCULAR | Status: AC
  Administered 2018-03-01: 8 mg via INTRAVENOUS

## 2018-03-01 MED ORDER — DIAZEPAM 5 MG PO TABS: 10.0000 mg | ORAL_TABLET | Freq: Two times a day (BID) | ORAL | Status: DC | PRN

## 2018-03-01 MED ORDER — MAGNESIUM HYDROXIDE 400 MG/5ML PO SUSP
30.0000 mL | Freq: Every day | ORAL | Status: DC
  Filled 2018-03-01: qty 30

## 2018-03-01 MED ORDER — COLCHICINE 0.6 MG PO TABS: 0.6000 mg | ORAL_TABLET | Freq: Every day | ORAL | Status: DC | PRN

## 2018-03-01 MED ORDER — ACETAMINOPHEN 10 MG/ML IV SOLN
1000.0000 mg | Freq: Four times a day (QID) | INTRAVENOUS | Status: AC
  Administered 2018-03-01 – 2018-03-02 (×3): 1000 mg via INTRAVENOUS
  Filled 2018-03-01 (×4): qty 100

## 2018-03-01 MED ORDER — CEFAZOLIN SODIUM-DEXTROSE 2-4 GM/100ML-% IV SOLN
2.0000 g | Freq: Four times a day (QID) | INTRAVENOUS | Status: AC
  Administered 2018-03-01 – 2018-03-02 (×4): 2 g via INTRAVENOUS
  Filled 2018-03-01 (×6): qty 100

## 2018-03-01 MED ORDER — TETRACAINE HCL 1 % IJ SOLN
INTRAMUSCULAR | Status: DC | PRN
  Administered 2018-03-01: 10 mg via INTRASPINAL

## 2018-03-01 MED ORDER — OXYCODONE HCL 5 MG PO TABS: 5.0000 mg | ORAL_TABLET | Freq: Once | ORAL | Status: DC | PRN

## 2018-03-01 MED ORDER — SODIUM CHLORIDE 0.9 % IV SOLN
INTRAVENOUS | Status: DC
  Administered 2018-03-01 – 2018-03-02 (×2): via INTRAVENOUS

## 2018-03-01 MED ORDER — CEFAZOLIN SODIUM-DEXTROSE 2-4 GM/100ML-% IV SOLN
INTRAVENOUS | Status: AC
  Filled 2018-03-01: qty 100

## 2018-03-01 MED ORDER — DIPHENHYDRAMINE HCL 12.5 MG/5ML PO ELIX: 12.5000 mg | ORAL_SOLUTION | ORAL | Status: DC | PRN

## 2018-03-01 MED ORDER — CELECOXIB 200 MG PO CAPS: 400.0000 mg | ORAL_CAPSULE | Freq: Once | ORAL | Status: DC

## 2018-03-01 MED ORDER — MEPERIDINE HCL 50 MG/ML IJ SOLN: 6.2500 mg | INTRAMUSCULAR | Status: DC | PRN

## 2018-03-01 MED ORDER — ACETAMINOPHEN 10 MG/ML IV SOLN
INTRAVENOUS | Status: AC
  Filled 2018-03-01: qty 100

## 2018-03-01 MED ORDER — ACETAMINOPHEN 325 MG PO TABS: 325.0000 mg | ORAL_TABLET | Freq: Four times a day (QID) | ORAL | Status: DC | PRN

## 2018-03-01 MED ORDER — HYDROMORPHONE HCL 1 MG/ML IJ SOLN: 0.5000 mg | INTRAMUSCULAR | Status: DC | PRN

## 2018-03-01 MED ORDER — GABAPENTIN 300 MG PO CAPS
300.0000 mg | ORAL_CAPSULE | Freq: Once | ORAL | Status: AC
  Administered 2018-03-01: 300 mg via ORAL

## 2018-03-01 MED ORDER — OXYCODONE HCL 5 MG/5ML PO SOLN: 5.0000 mg | Freq: Once | ORAL | Status: DC | PRN

## 2018-03-01 MED ORDER — GABAPENTIN 300 MG PO CAPS
ORAL_CAPSULE | ORAL | Status: AC
  Filled 2018-03-01: qty 1

## 2018-03-01 MED ORDER — FAMOTIDINE 20 MG PO TABS
ORAL_TABLET | ORAL | Status: AC
  Filled 2018-03-01: qty 1

## 2018-03-01 MED ORDER — FLEET ENEMA 7-19 GM/118ML RE ENEM: 1.0000 | ENEMA | Freq: Once | RECTAL | Status: DC | PRN

## 2018-03-01 MED ORDER — LACTATED RINGERS IV SOLN
INTRAVENOUS | Status: DC
  Administered 2018-03-01 (×2): via INTRAVENOUS

## 2018-03-01 MED ORDER — PROMETHAZINE HCL 25 MG/ML IJ SOLN
INTRAMUSCULAR | Status: AC
  Administered 2018-03-01: 6.25 mg via INTRAVENOUS
  Filled 2018-03-01: qty 1

## 2018-03-01 MED ORDER — CELECOXIB 200 MG PO CAPS
ORAL_CAPSULE | ORAL | Status: AC
  Administered 2018-03-01: 400 mg
  Filled 2018-03-01: qty 2

## 2018-03-01 MED ORDER — METOCLOPRAMIDE HCL 5 MG/ML IJ SOLN: 5.0000 mg | Freq: Three times a day (TID) | INTRAMUSCULAR | Status: DC | PRN

## 2018-03-01 MED ORDER — FENTANYL CITRATE (PF) 100 MCG/2ML IJ SOLN
25.0000 ug | INTRAMUSCULAR | Status: DC | PRN
  Administered 2018-03-01 (×3): 25 ug via INTRAVENOUS

## 2018-03-01 MED ORDER — LIOTHYRONINE SODIUM 5 MCG PO TABS
15.0000 ug | ORAL_TABLET | Freq: Every day | ORAL | Status: DC
  Administered 2018-03-02 – 2018-03-03 (×2): 15 ug via ORAL
  Filled 2018-03-01 (×3): qty 3

## 2018-03-01 MED ORDER — THYROID 30 MG PO TABS
30.0000 mg | ORAL_TABLET | Freq: Every day | ORAL | Status: DC
  Administered 2018-03-02 – 2018-03-03 (×2): 30 mg via ORAL
  Filled 2018-03-01 (×2): qty 1

## 2018-03-01 MED ORDER — WARFARIN - PHARMACIST DOSING INPATIENT
Freq: Every day | Status: DC
  Administered 2018-03-02: 17:00:00

## 2018-03-01 MED ORDER — TRANEXAMIC ACID-NACL 1000-0.7 MG/100ML-% IV SOLN
1000.0000 mg | Freq: Once | INTRAVENOUS | Status: AC
  Administered 2018-03-01: 1000 mg via INTRAVENOUS
  Filled 2018-03-01: qty 100

## 2018-03-01 MED ORDER — EPHEDRINE SULFATE 50 MG/ML IJ SOLN
INTRAMUSCULAR | Status: AC
  Filled 2018-03-01: qty 1

## 2018-03-01 MED ORDER — FENTANYL CITRATE (PF) 100 MCG/2ML IJ SOLN
INTRAMUSCULAR | Status: DC | PRN
  Administered 2018-03-01 (×2): 50 ug via INTRAVENOUS

## 2018-03-01 MED ORDER — ALUM & MAG HYDROXIDE-SIMETH 200-200-20 MG/5ML PO SUSP: 30.0000 mL | ORAL | Status: DC | PRN

## 2018-03-01 MED ORDER — BUPIVACAINE HCL (PF) 0.5 % IJ SOLN
INTRAMUSCULAR | Status: AC
  Filled 2018-03-01: qty 10

## 2018-03-01 MED ORDER — METOCLOPRAMIDE HCL 10 MG PO TABS: 5.0000 mg | ORAL_TABLET | Freq: Three times a day (TID) | ORAL | Status: DC | PRN

## 2018-03-01 MED ORDER — DEXAMETHASONE SODIUM PHOSPHATE 10 MG/ML IJ SOLN
INTRAMUSCULAR | Status: AC
  Filled 2018-03-01: qty 1

## 2018-03-01 SURGICAL SUPPLY — 62 items
ARTICULEZE HEAD (Hips) ×3 IMPLANT
CANISTER SUCT 1200ML W/VALVE (MISCELLANEOUS) ×3 IMPLANT
CANISTER SUCT 3000ML PPV (MISCELLANEOUS) ×6 IMPLANT
COVER WAND RF STERILE (DRAPES) ×3 IMPLANT
CUP ACETAB 56MM (Orthopedic Implant) ×3 IMPLANT
DRAPE INCISE IOBAN 66X60 STRL (DRAPES) ×3 IMPLANT
DRAPE SHEET LG 3/4 BI-LAMINATE (DRAPES) ×6 IMPLANT
DRAPE TABLE BACK 80X90 (DRAPES) ×3 IMPLANT
DRESSING ALLEVYN LIFE SACRUM (GAUZE/BANDAGES/DRESSINGS) ×3 IMPLANT
DRSG DERMACEA 8X12 NADH (GAUZE/BANDAGES/DRESSINGS) ×3 IMPLANT
DRSG OPSITE POSTOP 4X14 (GAUZE/BANDAGES/DRESSINGS) ×3 IMPLANT
DURAPREP 26ML APPLICATOR (WOUND CARE) ×3 IMPLANT
ELECT BLADE 6.5 EXT (BLADE) ×3 IMPLANT
ELECT CAUTERY BLADE 6.4 (BLADE) ×3 IMPLANT
GAUZE SPONGE 4X4 12PLY STRL (GAUZE/BANDAGES/DRESSINGS) ×3 IMPLANT
GLOVE BIOGEL M STRL SZ7.5 (GLOVE) ×6 IMPLANT
GLOVE BIOGEL PI IND STRL 9 (GLOVE) ×1 IMPLANT
GLOVE BIOGEL PI INDICATOR 9 (GLOVE) ×2
GLOVE INDICATOR 8.0 STRL GRN (GLOVE) ×3 IMPLANT
GLOVE SURG SYN 9.0  PF PI (GLOVE) ×2
GLOVE SURG SYN 9.0 PF PI (GLOVE) ×1 IMPLANT
GOWN STRL REUS W/ TWL LRG LVL3 (GOWN DISPOSABLE) ×2 IMPLANT
GOWN STRL REUS W/TWL 2XL LVL3 (GOWN DISPOSABLE) ×3 IMPLANT
GOWN STRL REUS W/TWL LRG LVL3 (GOWN DISPOSABLE) ×6
HANDPIECE VERSAJET DEBRIDEMENT (MISCELLANEOUS) IMPLANT
HEAD ARTICULEZE (Hips) ×1 IMPLANT
HEMOVAC 400CC 10FR (MISCELLANEOUS) ×3 IMPLANT
HOLDER FOLEY CATH W/STRAP (MISCELLANEOUS) ×3 IMPLANT
HOOD PEEL AWAY FLYTE STAYCOOL (MISCELLANEOUS) ×6 IMPLANT
IRRIGATION STRYKERFLOW (MISCELLANEOUS) ×1 IMPLANT
IRRIGATOR STRYKERFLOW (MISCELLANEOUS) ×3
IV NS 100ML SINGLE PACK (IV SOLUTION) ×3 IMPLANT
LINER MARATHON 4 NEUTRAL 36X56 (Hips) ×3 IMPLANT
NDL SAFETY ECLIPSE 18X1.5 (NEEDLE) ×1 IMPLANT
NEEDLE FILTER BLUNT 18X 1/2SAF (NEEDLE) ×2
NEEDLE FILTER BLUNT 18X1 1/2 (NEEDLE) ×1 IMPLANT
NEEDLE HYPO 18GX1.5 SHARP (NEEDLE) ×3
NS IRRIG 1000ML POUR BTL (IV SOLUTION) ×3 IMPLANT
PACK HIP PROSTHESIS (MISCELLANEOUS) ×3 IMPLANT
PENCIL SMOKE ULTRAEVAC 22 CON (MISCELLANEOUS) ×3 IMPLANT
PULSAVAC PLUS IRRIG FAN TIP (DISPOSABLE) ×3
SCREW 6.5MMX30MM (Screw) ×3 IMPLANT
SCREW PINN CAN BONE 6.5MMX15MM (Screw) ×6 IMPLANT
SOL .9 NS 3000ML IRR  AL (IV SOLUTION) ×2
SOL .9 NS 3000ML IRR AL (IV SOLUTION) ×1
SOL .9 NS 3000ML IRR UROMATIC (IV SOLUTION) ×1 IMPLANT
STAPLER SKIN PROX 35W (STAPLE) ×3 IMPLANT
STRAP SAFETY 5IN WIDE (MISCELLANEOUS) ×3 IMPLANT
SUCTION FRAZIER HANDLE 10FR (MISCELLANEOUS) ×2
SUCTION TUBE FRAZIER 10FR DISP (MISCELLANEOUS) ×1 IMPLANT
SUT ETHIBOND #5 BRAIDED 30INL (SUTURE) ×3 IMPLANT
SUT VIC AB 0 CT1 36 (SUTURE) ×3 IMPLANT
SUT VIC AB 1 CT1 36 (SUTURE) ×6 IMPLANT
SUT VIC AB 2-0 CT1 27 (SUTURE) ×3
SUT VIC AB 2-0 CT1 TAPERPNT 27 (SUTURE) ×1 IMPLANT
SYR 20CC LL (SYRINGE) ×3 IMPLANT
TAPE CLOTH 3X10 WHT NS LF (GAUZE/BANDAGES/DRESSINGS) ×3 IMPLANT
TAPE TRANSPORE STRL 2 31045 (GAUZE/BANDAGES/DRESSINGS) ×3 IMPLANT
TIP BRUSH PULSAVAC PLUS 24.33 (MISCELLANEOUS) IMPLANT
TIP FAN IRRIG PULSAVAC PLUS (DISPOSABLE) ×1 IMPLANT
TOWEL OR 17X26 4PK STRL BLUE (TOWEL DISPOSABLE) ×3 IMPLANT
TRAY FOLEY MTR SLVR 16FR STAT (SET/KITS/TRAYS/PACK) ×3 IMPLANT

## 2018-03-01 NOTE — Op Note (Signed)
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 M.D.  ASSISTANT: Liliane Bade, PA-C (present and scrubbed throughout the case, critical for assistance with exposure, retraction, instrumentation, and closure)  ANESTHESIA: spinal  ESTIMATED BLOOD LOSS: 50 mL  FLUIDS REPLACED: 1400 mL of crystalloid  DRAINS: None  IMPLANTS UTILIZED: DePuy  56 mm OD Pinnacle Gription multihole acetabular component, 3 -6.5 mm cancellus bone screws (15 mm, 15 mm, 30 mm), +4 mm neutral Marathon polyethylene insert, and a 36 mm M-SPEC +5 mm hip ball  INDICATIONS FOR SURGERY: [name] is a 78 y.o. year old female who previously underwent a left total hip arthroplasty.  She had done well for approximately 2 years but noted the onset of some discomfort and "squeaking" with ambulation.  X-rays demonstrated evidence of loosening of the acetabular component with gross failure of the polyethylene. After discussion of the risks and benefits of surgical intervention, the patient expressed understanding of the risks benefits and agree with plans for revision hip arthroplasty.   The risks, benefits, and alternatives were discussed at length including but not limited to the risks of infection, bleeding, nerve injury, stiffness, blood clots, the need for revision surgery, limb length inequality, dislocation, cardiopulmonary complications, among others, and they were willing to proceed.  PROCEDURE IN DETAIL: The patient was brought into the operating room and, after adequate spinal anesthesia was achieved, the patient was placed in a right lateral decubitus position. Axillary roll was placed and all bony prominences were well-padded. The patient's left hip  was cleaned and prepped with alcohol and DuraPrep and draped in the usual sterile fashion. A "timeout" was performed as per usual protocol. A lateral curvilinear incision was made in line with the previous surgical scar gently curving towards the posterior superior iliac spine. The IT band was incised in line with the skin incision and the fibers of the gluteus maximus were split in line.  A prominent posterior pseudocapsule was noted and a T type posterior capsulotomy was performed.  Grayish fluid was noted and swabs were obtained for stat Gram stain, culture, and sensitivity.  The femoral head was then dislocated posteriorly and the hip ball was removed from the trunnion.  The trunnion was inspected and noted to be in good condition.  Inspection of the femoral head demonstrated evidence of burnishing along the superior portion.  The trunnion was then displaced anterior to the cup taking care to protect the trunnion.  There was gross failure of the polyethylene with marked wear inferiorly and deformation and dislocation of the polyethylene liner.  The polyethylene liner was removed without difficulty.  Electrocautery was used to debride some of the fibrotic tissue around the periphery of the cup.  Curettes were then used to further debride along the acetabular rim.  The tissues were stained with metal debris and these areas were debrided.  The acetabular component did not demonstrate any gross motion but was obviously malpositioned.  A polyethylene trial was inserted into the cup. The Innomed acetabular extraction device was used around the periphery of the cup with first the short and subsequently the longer cutting blades.  This allowed for extraction of the cup.  There was some evidence of ingrowth but also larger areas involving fibrous ingrowth.  The acetabulum  was then reamed in a sequential fashion up to a 55 mm diameter.  Good punctate bleeding bone was encountered.  A trial cup was positioned with good fit  appreciated.  A 56 mm Pinnacle Gription multihole acetabular component was positioned and impacted into place. Good scratch fit was appreciated.  Additional fixation was achieved using a total of three 6.5 mm cancellus screws.  A was 4 mm neutral polyethylene trial was inserted.  A trial reduction was again performed with a 36 mm hip ball with a +5 mm neck length. Good equalization of limb lengths was appreciated and excellent stability appreciated both anteriorly and posteriorly. The hip was then dislocated and the trial hip ball was removed.  A +4 mm neutral Marathon polyethylene insert was positioned and impacted into place.  The Morse taper was cleaned and dried. A 36 mm M-SPEC hip ball with a +5 mm neck length was placed on the trunnion and impacted into place. The hip was then reduced and placed through range of motion. Excellent stability was appreciated both anteriorly and posteriorly.  The wound was irrigated with copious amounts of normal saline with antibiotic solution and suctioned dry. Good hemostasis was appreciated. The posterior capsulotomy was repaired using #5 Ethibond.  The IT band was reapproximated using interrupted sutures of #1 Vicryl. Subcutaneous tissue was approximated using first #0 Vicryl followed by #2-0 Vicryl. The skin was closed with skin staples.  The patient tolerated the procedure well and was transported to the recovery room in stable condition.   Marciano Sequin., M.D.

## 2018-03-01 NOTE — Anesthesia Procedure Notes (Signed)
Spinal  Patient location during procedure: OR End time: 03/01/2018 11:19 AM Staffing Anesthesiologist: Emmie Niemann, MD Resident/CRNA: Jonna Clark, CRNA Performed: resident/CRNA  Preanesthetic Checklist Completed: patient identified, site marked, surgical consent, pre-op evaluation, timeout performed, IV checked, risks and benefits discussed and monitors and equipment checked Spinal Block Patient position: sitting Prep: Betadine Patient monitoring: heart rate, continuous pulse ox, blood pressure and cardiac monitor Approach: midline Location: L4-5 Injection technique: single-shot Needle Needle type: Whitacre and Introducer  Needle gauge: 24 G Needle length: 9 cm Additional Notes Negative paresthesia. Negative blood return. Positive free-flowing CSF. Expiration date of kit checked and confirmed. Patient tolerated procedure well, without complications.

## 2018-03-01 NOTE — Discharge Instructions (Signed)
Instructions after Total Hip Replacement ° ° °  Udell Blasingame P. Abigial Newville, Jr., M.D.    ° Dept. of Orthopaedics & Sports Medicine ° [facility] ° [address] ° [place], [place]  [address] ° Phone: [phone]   Fax: [phone] ° °  °DIET: °• Drink plenty of non-alcoholic fluids. °• Resume your normal diet. Include foods high in fiber. ° °ACTIVITY:  °• You may use crutches or a walker with weight-bearing as tolerated, unless instructed otherwise. °• You may be weaned off of the walker or crutches by your Physical Therapist.  °• Do NOT reach below the level of your knees or cross your legs until allowed.    °• Continue doing gentle exercises. Exercising will reduce the pain and swelling, increase motion, and prevent muscle weakness.   °• Please continue to use the TED compression stockings for 6 weeks. You may remove the stockings at night, but should reapply them in the morning. °• Do not drive or operate any equipment until instructed. ° °WOUND CARE:  °• Continue to use ice packs periodically to reduce pain and swelling. °• Keep the incision clean and dry. °• You may bathe or shower after the staples are removed at the first office visit following surgery. ° °MEDICATIONS: °• You may resume your regular medications. °• Please take the pain medication as prescribed on the medication. °• Do not take pain medication on an empty stomach. °• You have been given a prescription for a blood thinner to prevent blood clots. Please take the medication as instructed. (NOTE: After completing a 2 week course of Lovenox, take one Enteric-coated aspirin once a day.) °• Pain medications and iron supplements can cause constipation. Use a stool softener (Senokot or Colace) on a daily basis and a laxative (dulcolax or miralax) as needed. °• Do not drive or drink alcoholic beverages when taking pain medications. ° °CALL THE OFFICE FOR: °• Temperature above 101 degrees °• Excessive bleeding or drainage on the dressing. °• Excessive  swelling, coldness, or paleness of the toes. °• Persistent nausea and vomiting. ° °FOLLOW-UP:  °• You should have an appointment to return to the office in 6 weeks after surgery. °• Arrangements have been made for continuation of Physical Therapy (either home therapy or outpatient therapy). °  °

## 2018-03-01 NOTE — Transfer of Care (Signed)
Immediate Anesthesia Transfer of Care Note  Patient: [name]  Procedure(s) Performed: TOTAL HIP REVISION (Left Hip)  Patient Location: PACU  Anesthesia Type:Spinal  Level of Consciousness: drowsy and patient cooperative  Airway & Oxygen Therapy: Patient Spontanous Breathing and Patient connected to nasal cannula oxygen  Post-op Assessment: Report given to RN and Post -op Vital signs reviewed and stable  Post vital signs: Reviewed and stable  Last Vitals:  Vitals Value Taken Time  BP 126/66 03/01/2018  3:16 PM  Temp    Pulse 63 03/01/2018  3:17 PM  Resp 19 03/01/2018  3:17 PM  SpO2 99 % 03/01/2018  3:17 PM  Vitals shown include unvalidated device data.  Last Pain:  Vitals:   03/01/18 0919  TempSrc: Tympanic         Complications: No apparent anesthesia complications

## 2018-03-01 NOTE — Consult Note (Signed)
ANTICOAGULATION CONSULT NOTE - Initial Consult  Pharmacy Consult for Warfarin Indication: atrial fibrillation  Allergies  Allergen Reactions  . Contrast Media [Iodinated Diagnostic Agents] Rash    Contrast dye causes swelling    Patient Measurements: Height: 5\' 1"  (154.9 cm) Weight: 191 lb (86.6 kg) IBW/kg (Calculated) : 47.8   Vital Signs: Temp: 96.7 F (35.9 C) (01/03 0919) Temp Source: Tympanic (01/03 0919) BP: 154/81 (01/03 0919) Pulse Rate: 91 (01/03 0919)  Labs: Recent Labs    03/01/18 1000  LABPROT 13.8  INR 1.07    Estimated Creatinine Clearance: 43.7 mL/min (A) (by C-G formula based on SCr of 1.06 mg/dL (H)).   Medical History: Past Medical History:  Diagnosis Date  . Anxiety   . Aortic insufficiency    Trivial  . Arthritis   . Asthma   . Bronchitis   . Bursitis, hip   . Claustrophobia   . Claustrophobia   . Dysrhythmia    a fib  . GERD (gastroesophageal reflux disease)    history of  . Gout   . Hyperlipidemia   . Hypertension   . Hypothyroidism   . Mitral regurgitation   . Obesity   . Panic attacks   . Paroxysmal atrial fibrillation (HCC)   . PTSD (post-traumatic stress disorder)   . Rhinitis   . Sick sinus syndrome ([place])   . Sleep apnea    was ordered CPAP, but never fitted for one years ago, Dr has retired    Medications:  Pt known home warfarin dose is 2.5mg  daily  Assessment: Pt last home dose recorded prior to procedure 02/24/18.  Pt INR is subtherapeutic @ 1.07    Goal of Therapy:  INR 2-3 Monitor platelets by anticoagulation protocol: Yes   Plan:  Will obtain a baseline CBC and will dose @ 50% greater than known home dose per protocol for subtherapeutic patient - 1st dose 4mg   Will monitor INR and CBC's daily with AM labs  Lu Duffel, PharmD, BCPS Clinical Pharmacist 03/01/2018 12:58 PM

## 2018-03-01 NOTE — Anesthesia Preprocedure Evaluation (Signed)
Anesthesia Evaluation  Patient identified by MRN, date of birth, ID band Patient awake    Reviewed: Allergy & Precautions, NPO status , Patient's Chart, lab work & pertinent test results  History of Anesthesia Complications Negative for: history of anesthetic complications  Airway Mallampati: II  TM Distance: >3 FB Neck ROM: Full    Dental no notable dental hx.    Pulmonary asthma , sleep apnea (pt denies) ,    breath sounds clear to auscultation- rhonchi (-) wheezing      Cardiovascular hypertension, Pt. on medications (-) CAD, (-) Past MI, (-) Cardiac Stents and (-) CABG + dysrhythmias Atrial Fibrillation  Rhythm:Regular Rate:Normal - Systolic murmurs and - Diastolic murmurs    Neuro/Psych neg Seizures PSYCHIATRIC DISORDERS Anxiety negative neurological ROS     GI/Hepatic Neg liver ROS, GERD  ,  Endo/Other  neg diabetesHypothyroidism   Renal/GU Renal InsufficiencyRenal disease     Musculoskeletal  (+) Arthritis ,   Abdominal (+) + obese,   Peds  Hematology  (+) anemia ,   Anesthesia Other Findings Past Medical History: No date: Anxiety No date: Aortic insufficiency     Comment:  Trivial No date: Arthritis No date: Asthma No date: Bronchitis No date: Bursitis, hip No date: Claustrophobia No date: Claustrophobia No date: Dysrhythmia     Comment:  a fib No date: GERD (gastroesophageal reflux disease)     Comment:  history of No date: Gout No date: Hyperlipidemia No date: Hypertension No date: Hypothyroidism No date: Mitral regurgitation No date: Obesity No date: Panic attacks No date: Paroxysmal atrial fibrillation (HCC) No date: PTSD (post-traumatic stress disorder) No date: Rhinitis No date: Sick sinus syndrome ([place]) No date: Sleep apnea     Comment:  was ordered CPAP, but never fitted for one years ago, Dr              has retired   Reproductive/Obstetrics                              Lab Results  Component Value Date   WBC 5.8 02/21/2018   HGB 14.2 02/21/2018   HCT 44.5 02/21/2018   MCV 97.2 02/21/2018   PLT 174 02/21/2018    Anesthesia Physical Anesthesia Plan  ASA: III  Anesthesia Plan: Spinal   Post-op Pain Management:    Induction:   PONV Risk Score and Plan: 2 and Propofol infusion  Airway Management Planned: Natural Airway  Additional Equipment:   Intra-op Plan:   Post-operative Plan:   Informed Consent: I have reviewed the patients History and Physical, chart, labs and discussed the procedure including the risks, benefits and alternatives for the proposed anesthesia with the patient or authorized representative who has indicated his/her understanding and acceptance.   Dental advisory given  Plan Discussed with: CRNA and Anesthesiologist  Anesthesia Plan Comments: (Discussed backup GA if prolonged surgical time)        Anesthesia Quick Evaluation

## 2018-03-01 NOTE — Anesthesia Post-op Follow-up Note (Signed)
Anesthesia QCDR form completed.        

## 2018-03-01 NOTE — H&P (Signed)
The patient has been re-examined, and the chart reviewed, and there have been no interval changes to the documented history and physical.    The risks, benefits, and alternatives have been discussed at length. The patient expressed understanding of the risks benefits and agreed with plans for surgical intervention.  James P. Hooten, Jr. M.D.    

## 2018-03-02 LAB — CBC
HCT: 37.4 % (ref 36.0–46.0)
Hemoglobin: 11.9 g/dL — ABNORMAL LOW (ref 12.0–15.0)
MCH: 31.2 pg (ref 26.0–34.0)
MCHC: 31.8 g/dL (ref 30.0–36.0)
MCV: 97.9 fL (ref 80.0–100.0)
Platelets: 116 10*3/uL — ABNORMAL LOW (ref 150–400)
RBC: 3.82 MIL/uL — ABNORMAL LOW (ref 3.87–5.11)
RDW: 13.5 % (ref 11.5–15.5)
WBC: 4.3 10*3/uL (ref 4.0–10.5)
nRBC: 0 % (ref 0.0–0.2)

## 2018-03-02 LAB — BASIC METABOLIC PANEL
Anion gap: 7 (ref 5–15)
BUN: 25 mg/dL — ABNORMAL HIGH (ref 8–23)
CO2: 21 mmol/L — AB (ref 22–32)
Calcium: 8.3 mg/dL — ABNORMAL LOW (ref 8.9–10.3)
Chloride: 106 mmol/L (ref 98–111)
Creatinine, Ser: 1.04 mg/dL — ABNORMAL HIGH (ref 0.44–1.00)
GFR calc Af Amer: 60 mL/min — ABNORMAL LOW (ref >60)
GFR calc non Af Amer: 51 mL/min — ABNORMAL LOW (ref >60)
Glucose, Bld: 130 mg/dL — ABNORMAL HIGH (ref 70–99)
Potassium: 4.6 mmol/L (ref 3.5–5.1)
Sodium: 134 mmol/L — ABNORMAL LOW (ref 135–145)

## 2018-03-02 LAB — PROTIME-INR
INR: 1.18
Prothrombin Time: 14.9 seconds (ref 11.4–15.2)

## 2018-03-02 MED ORDER — WARFARIN SODIUM 5 MG PO TABS
5.0000 mg | ORAL_TABLET | Freq: Every day | ORAL | Status: DC
  Administered 2018-03-02: 5 mg via ORAL
  Filled 2018-03-02 (×2): qty 1

## 2018-03-02 NOTE — Progress Notes (Signed)
Physical Therapy Treatment Patient Details Name: [name] MRN: [id number] DOB: 1940/04/23 Today's Date: 03/02/2018    History of Present Illness 78 year old female here for L THA revision. PMH to include anxiety, OA, gerd, HTN, HLD, Hypothyroid, obesity.     PT Comments    Patient received in chair after finishing lunch. Ready to walk. Patient progressing very well, ambulated around nurses stations on down short hall with rw and sba. Demonstrates step through gait pattern, no limping. Performed LE strengthening exercises in bed: ap, hip abd/add, heel slides, gs, SLR x 5-10 reps each. No assist needed. Patient will continue to benefit from skilled PT while here to improve strength and independence for safe return home.        Follow Up Recommendations  Home health PT;Supervision - Intermittent     Equipment Recommendations  None recommended by PT    Recommendations for Other Services       Precautions / Restrictions Precautions Precautions: Posterior Hip Precaution Comments: Patient able to state 3/3 hip precautions Restrictions Weight Bearing Restrictions: Yes LLE Weight Bearing: Weight bearing as tolerated    Mobility  Bed Mobility Overal bed mobility: Modified Independent                Transfers Overall transfer level: Modified independent Equipment used: Rolling walker (2 wheeled)             General transfer comment: Min guard for toilet transfer   Ambulation/Gait Ambulation/Gait assistance: Modified independent (Device/Increase time);Min guard Gait Distance (Feet): 150 Feet Assistive device: Rolling walker (2 wheeled) Gait Pattern/deviations: WFL(Within Functional Limits) Gait velocity: decreased   General Gait Details: patient ambulating well, good cadence, reports mild soreness   Stairs             Wheelchair Mobility    Modified Rankin (Stroke Patients Only)       Balance Overall balance assessment: Mild deficits observed,  not formally tested Sitting-balance support: Feet supported Sitting balance-Leahy Scale: Good     Standing balance support: Bilateral upper extremity supported Standing balance-Leahy Scale: Good                              Cognition Arousal/Alertness: Awake/alert Behavior During Therapy: WFL for tasks assessed/performed Overall Cognitive Status: Within Functional Limits for tasks assessed                                        Exercises Total Joint Exercises Ankle Circles/Pumps: AROM;10 reps;Supine;Left Gluteal Sets: AROM;10 reps;Left;Supine Heel Slides: AROM;Left;10 reps Hip ABduction/ADduction: AROM;Left;10 reps Straight Leg Raises: AROM;Left;5 reps Long Arc Quad: AROM;5 reps;Left;Seated    General Comments        Pertinent Vitals/Pain Pain Assessment: 0-10 Pain Score: 4  Pain Descriptors / Indicators: Sore Pain Intervention(s): Premedicated before session;Monitored during session    Home Living Family/patient expects to be discharged to:: Private residence Living Arrangements: Spouse/significant other;Children Available Help at Discharge: Family Type of Home: House Home Access: Stairs to enter Entrance Stairs-Rails: Left;Right Home Layout: Multi-level;Able to live on main level with bedroom/bathroom Home Equipment: Gilford Rile - 2 wheels;Cane - single point;Wheelchair - Psychologist, educational      Prior Function Level of Independence: Independent      Comments: was using walker or cane as needed, driving   PT Goals (current goals can now be found in the care  plan section) Acute Rehab PT Goals Patient Stated Goal: Patient wants to return home with family and be independent PT Goal Formulation: With patient Time For Goal Achievement: 03/16/18 Potential to Achieve Goals: Good Progress towards PT goals: Progressing toward goals    Frequency    BID      PT Plan Current plan remains appropriate    Co-evaluation               AM-PAC PT "6 Clicks" Mobility   Outcome Measure  Help needed turning from your back to your side while in a flat bed without using bedrails?: A Little Help needed moving from lying on your back to sitting on the side of a flat bed without using bedrails?: A Little Help needed moving to and from a bed to a chair (including a wheelchair)?: A Little Help needed standing up from a chair using your arms (e.g., wheelchair or bedside chair)?: A Little Help needed to walk in hospital room?: A Little Help needed climbing 3-5 steps with a railing? : A Little 6 Click Score: 18    End of Session Equipment Utilized During Treatment: Gait belt Activity Tolerance: Patient tolerated treatment well;No increased pain Patient left: in bed;with bed alarm set;with call bell/phone within reach;with family/visitor present Nurse Communication: Mobility status PT Visit Diagnosis: Muscle weakness (generalized) (M62.81);Pain Pain - Right/Left: Left Pain - part of body: Hip     Time: 1240-1300 PT Time Calculation (min) (ACUTE ONLY): 20 min  Charges:  $Gait Training: 8-22 mins $Therapeutic Exercise: 8-22 mins $Therapeutic Activity: 8-22 mins                     Aveah Castell, PT, GCS 03/02/18,1:08 PM

## 2018-03-02 NOTE — Anesthesia Postprocedure Evaluation (Signed)
Anesthesia Post Note  Patient: [name]  Procedure(s) Performed: TOTAL HIP REVISION (Left Hip)  Anesthesia Type: Spinal Level of consciousness: awake and alert Pain management: pain level controlled Respiratory status: spontaneous breathing and nonlabored ventilation Postop Assessment: no headache and spinal receding (sensation and strength reported as normal) Anesthetic complications: no     Last Vitals:  Vitals:   03/02/18 0342 03/02/18 0757  BP: (!) 100/43 118/73  Pulse: 67 82  Resp: 17   Temp:  36.8 C  SpO2: 98% 100%    Last Pain:  Vitals:   03/02/18 0912  TempSrc:   PainSc: [place]

## 2018-03-02 NOTE — Progress Notes (Signed)
Subjective: 1 Day Post-Op Procedure(s) (LRB): TOTAL HIP REVISION (Left) Patient reports pain as mild.   Patient is well, and has had no acute complaints or problems PT and Care management to assist with discharge planning. Negative for chest pain and shortness of breath Fever: no Gastrointestinal:Negative for nausea and vomiting  Objective: Vital signs in last 24 hours: Temp:  [96.7 F (35.9 C)-98 F (36.7 C)] 97.4 F (36.3 C) (01/04 0007) Pulse Rate:  [54-91] 67 (01/04 0342) Resp:  [10-20] 17 (01/04 0342) BP: (93-154)/(42-88) 100/43 (01/04 0342) SpO2:  [96 %-100 %] 98 % (01/04 0342) Weight:  [86.6 kg] 86.6 kg (01/03 0919)  Intake/Output from previous day:  Intake/Output Summary (Last 24 hours) at 03/02/2018 0726 Last data filed at 03/02/2018 0550 Gross per 24 hour  Intake 3751.86 ml  Output 1270 ml  Net 2481.86 ml    Intake/Output this shift: No intake/output data recorded.  Labs: Recent Labs    03/01/18 1746 03/02/18 0440  HGB 14.3 11.9*   Recent Labs    03/01/18 1746 03/02/18 0440  WBC 6.9 4.3  RBC 4.58 3.82*  HCT 47.2* 37.4  PLT 126* 116*   No results for input(s): NA, K, CL, CO2, BUN, CREATININE, GLUCOSE, CALCIUM in the last 72 hours. Recent Labs    03/01/18 1000 03/02/18 0440  INR 1.07 1.18     EXAM General - Patient is Alert, Appropriate and Oriented Extremity - ABD soft Sensation intact distally Intact pulses distally Dorsiflexion/Plantar flexion intact Incision: dressing C/D/I No cellulitis present Dressing/Incision - clean, dry, no drainage Motor Function - intact, moving foot and toes well on exam.  Abdomen soft with normal BS.  Past Medical History:  Diagnosis Date  . Anxiety   . Aortic insufficiency    Trivial  . Arthritis   . Asthma   . Bronchitis   . Bursitis, hip   . Claustrophobia   . Claustrophobia   . Dysrhythmia    a fib  . GERD (gastroesophageal reflux disease)    history of  . Gout   . Hyperlipidemia   .  Hypertension   . Hypothyroidism   . Mitral regurgitation   . Obesity   . Panic attacks   . Paroxysmal atrial fibrillation (HCC)   . PTSD (post-traumatic stress disorder)   . Rhinitis   . Sick sinus syndrome ([place])   . Sleep apnea    was ordered CPAP, but never fitted for one years ago, Dr has retired    Assessment/Plan: 1 Day Post-Op Procedure(s) (LRB): [place] (Left) Active Problems:   S/P revision of total hip  Estimated body mass index is 36.09 kg/m as calculated from the following:   Height as of this encounter: 5\' 1"  (1.549 m).   Weight as of this encounter: 86.6 kg. Advance diet Up with therapy D/C IV fluids when tolerating increased po intake.  BP 100/43, continue IVF this AM.  Increased oral intake. Labs reviewed this AM, will add BMP. Up with therapy today. Pt reports not passing gas at this time. Possible discharge home tomorrow.  DVT Prophylaxis - Coumadin, Foot Pumps and TED hose Weight-Bearing as tolerated to left leg  J. Cameron Proud, PA-C [facility] Orthopaedic Surgery 03/02/2018, 7:26 AM

## 2018-03-02 NOTE — Clinical Social Work Note (Addendum)
CSW received consult for possible SNF placement. PT evaluation is pending; CSW will assess should PT recommend SNF.  UPDATE: PT recommendation is HHPT and the patient is in agreement. CSW is signing off. Please consult should needs arise.  Santiago Bumpers, MSW, Latanya Presser [phone]

## 2018-03-02 NOTE — Evaluation (Signed)
Physical Therapy Evaluation Patient Details Name: Lakeitha Basques MRN: [id number] DOB: Sep 16, 1940 Today's Date: 03/02/2018   History of Present Illness  78 year old female here for L THA revision. PMH to include anxiety, OA, gerd, HTN, HLD, Hypothyroid, obesity.   Clinical Impression  Patient alert and resting in bed with RN present upon arrival. Patient reporting mod 4/10 pain. Medicated prior to session. Patient educated on hip precautions prior to mobility and verbalized understanding. Patient requires set up/supervision only for bed mobility. Supervision for sit to stand and ambulated 20 feet with rw and min guard. Patient will benefit from continued PT while here to improve functional independence and ensure safety when returning home.     Follow Up Recommendations Home health PT;Supervision for mobility/OOB    Equipment Recommendations  None recommended by PT    Recommendations for Other Services       Precautions / Restrictions Precautions Precautions: Posterior Hip Restrictions Weight Bearing Restrictions: Yes LLE Weight Bearing: Weight bearing as tolerated      Mobility  Bed Mobility Overal bed mobility: Modified Independent                Transfers Overall transfer level: Modified independent Equipment used: Rolling walker (2 wheeled)             General transfer comment: raised height of bed slgihtly for ease of transfer  Ambulation/Gait Ambulation/Gait assistance: Modified independent (Device/Increase time);Min guard Gait Distance (Feet): 20 Feet Assistive device: Rolling walker (2 wheeled) Gait Pattern/deviations: Step-to pattern Gait velocity: decreased   General Gait Details: patient ambulating well, good cadence, reports mild soreness  Stairs            Wheelchair Mobility    Modified Rankin (Stroke Patients Only)       Balance Overall balance assessment: Needs assistance Sitting-balance support: Single extremity supported;Feet  supported Sitting balance-Leahy Scale: Good     Standing balance support: Bilateral upper extremity supported Standing balance-Leahy Scale: Good                               Pertinent Vitals/Pain Pain Assessment: 0-10 Pain Score: 4  Pain Descriptors / Indicators: Sore Pain Intervention(s): Limited activity within patient's tolerance;Monitored during session;Premedicated before session    Home Living Family/patient expects to be discharged to:: Private residence Living Arrangements: Spouse/significant other;Children Available Help at Discharge: Family Type of Home: House Home Access: Stairs to enter Entrance Stairs-Rails: Chemical engineer of Steps: 3 Home Layout: One level Home Equipment: Environmental consultant - 2 wheels;Cane - single point;Wheelchair - manual      Prior Function Level of Independence: Independent         Comments: was using walker or cane as needed, driving     Hand Dominance   Dominant Hand: Right    Extremity/Trunk Assessment   Upper Extremity Assessment Upper Extremity Assessment: Overall WFL for tasks assessed    Lower Extremity Assessment Lower Extremity Assessment: Overall WFL for tasks assessed       Communication   Communication: No difficulties  Cognition Arousal/Alertness: Awake/alert Behavior During Therapy: WFL for tasks assessed/performed Overall Cognitive Status: Within Functional Limits for tasks assessed                                        General Comments      Exercises Total Joint Exercises Ankle  Circles/Pumps: AROM;10 reps;Both;Supine Long Arc Quad: AROM;5 reps;Left;Seated   Assessment/Plan    PT Assessment Patient needs continued PT services  PT Problem List Decreased strength;Decreased mobility;Decreased activity tolerance;Decreased balance;Pain;Obesity       PT Treatment Interventions Gait training;Functional mobility training;Balance training;Patient/family  education;Therapeutic activities;Stair training;Therapeutic exercise    PT Goals (Current goals can be found in the Care Plan section)  Acute Rehab PT Goals Patient Stated Goal: to return home tomorrow with HHPT  PT Goal Formulation: With patient Time For Goal Achievement: 03/16/18 Potential to Achieve Goals: Good    Frequency BID   Barriers to discharge        Co-evaluation               AM-PAC PT "6 Clicks" Mobility  Outcome Measure Help needed turning from your back to your side while in a flat bed without using bedrails?: A Lot Help needed moving from lying on your back to sitting on the side of a flat bed without using bedrails?: A Little Help needed moving to and from a bed to a chair (including a wheelchair)?: A Little Help needed standing up from a chair using your arms (e.g., wheelchair or bedside chair)?: A Little Help needed to walk in hospital room?: A Little Help needed climbing 3-5 steps with a railing? : A Lot 6 Click Score: 16    End of Session Equipment Utilized During Treatment: Gait belt Activity Tolerance: Patient tolerated treatment well Patient left: in chair;with chair alarm set;with call bell/phone within reach Nurse Communication: Mobility status PT Visit Diagnosis: Muscle weakness (generalized) (M62.81);Difficulty in walking, not elsewhere classified (R26.2);Pain Pain - Right/Left: Left Pain - part of body: Hip    Time: 1448-1856 PT Time Calculation (min) (ACUTE ONLY): 35 min   Charges:   PT Evaluation $PT Eval Moderate Complexity: 1 Mod PT Treatments $Gait Training: 8-22 mins $Therapeutic Exercise: 8-22 mins        Malaiah Viramontes, PT, GCS 03/02/18,9:52 AM

## 2018-03-02 NOTE — Evaluation (Signed)
Occupational Therapy Evaluation Patient Details Name: [name] MRN: [id number] DOB: 06/06/1940 Today's Date: 03/02/2018    History of Present Illness 78 year old female here for L THA revision. PMH to include anxiety, OA, gerd, HTN, HLD, Hypothyroid, obesity.    Clinical Impression   Patient is a 78 yo female admitted for Left total hip revision, original surgery was 12/22/2015.  Patient lives with her husband and her daughter.  She was previously independent with all self care and IADL tasks but did have help from her daughter with laundry which is in the basement and with heavier household cleaning tasks.  She is familiar with hip precautions from her previous surgery and is able to state 3/3 precautions. Patient requires min guard for toilet transfers, reeducated on adaptive equipment and implementation with hip precautions for lower body dressing skills.  Handout provided with info.  She will not likely require follow up OT at discharge from the hospital setting.     Follow Up Recommendations  No OT follow up    Equipment Recommendations       Recommendations for Other Services       Precautions / Restrictions Precautions Precautions: Posterior Hip Precaution Comments: Patient able to state 3/3 hip precautions Restrictions Weight Bearing Restrictions: Yes LLE Weight Bearing: Weight bearing as tolerated      Mobility Bed Mobility Overal bed mobility: Modified Independent                Transfers Overall transfer level: Modified independent Equipment used: Rolling walker (2 wheeled)             General transfer comment: Min guard for toilet transfer     Balance Overall balance assessment: Needs assistance Sitting-balance support: Single extremity supported;Feet supported Sitting balance-Leahy Scale: Good     Standing balance support: Bilateral upper extremity supported Standing balance-Leahy Scale: Good                             ADL  either performed or assessed with clinical judgement   ADL Overall ADL's : Needs assistance/impaired Eating/Feeding: Independent   Grooming: Min guard   Upper Body Bathing: Set up   Lower Body Bathing: Adhering to hip precautions;With adaptive equipment Lower Body Bathing Details (indicate cue type and reason): Patient has posterior hip precautions and is aware she will need to use long handled sponge she has at home or caregiver assist for LB Upper Body Dressing : Independent   Lower Body Dressing: With adaptive equipment;Cueing for sequencing;Min guard   Toilet Transfer: Min guard   Toileting- Clothing Manipulation and Hygiene: Min guard       Functional mobility during ADLs: Min guard General ADL Comments: Patient performing well today, familiar with precautions from the past surgery and is able to state and demo understanding in relation to LB dressing and self care tasks.  She will have assist at home from husband and daughter at discharge.  She does not feel she will need any follow up OT after hospital stay. She has all necessary adaptive equipment at home.   ADL TX:  Patient instructed on lower body dressing skills with use of AE and following posterior hip precautions.  She is doing well this date and is at a min guard level for transfers and is aware she will require AE to complete lower body self care or assist from family.   Vision Baseline Vision/History: Wears glasses Wears Glasses: Reading only  Perception     Praxis      Pertinent Vitals/Pain Pain Assessment: No/denies pain Pain Score: 0-No pain Pain Descriptors / Indicators: Sore Pain Intervention(s): Limited activity within patient's tolerance;Monitored during session;Premedicated before session     Hand Dominance Right   Extremity/Trunk Assessment Upper Extremity Assessment Upper Extremity Assessment: Generalized weakness   Lower Extremity Assessment Lower Extremity Assessment: Defer to PT  evaluation       Communication Communication Communication: No difficulties   Cognition Arousal/Alertness: Awake/alert Behavior During Therapy: WFL for tasks assessed/performed Overall Cognitive Status: Within Functional Limits for tasks assessed                                     General Comments       Exercises Total Joint Exercises Ankle Circles/Pumps: AROM;10 reps;Both;Supine Long Arc Quad: AROM;5 reps;Left;Seated   Shoulder Instructions      Home Living Family/patient expects to be discharged to:: Private residence Living Arrangements: Spouse/significant other;Children Available Help at Discharge: Family Type of Home: House Home Access: Stairs to enter Technical brewer of Steps: 3 Entrance Stairs-Rails: Left;Right Home Layout: Multi-level;Able to live on main level with bedroom/bathroom Alternate Level Stairs-Number of Steps: Patient stays on main level and does not need to access other levels of the home,  laundry in basement but daughter performs this task   Bathroom Shower/Tub: Walk-in shower;Curtain   Bathroom Toilet: Standard(has a BSC to use over the toilet from past surgery)     Home Equipment: Walker - 2 wheels;Cane - single point;Wheelchair - Scientist, physiological: Reacher;Long-handled sponge;Sock aid;Long-handled Conservation officer, historic buildings        Prior Functioning/Environment Level of Independence: Independent        Comments: was using walker or cane as needed, driving        OT Problem List: Decreased strength;Pain(no pain at eval but had pain earlier with PT)      OT Treatment/Interventions: Self-care/ADL training;DME and/or AE instruction;Patient/family education    OT Goals(Current goals can be found in the care plan section) Acute Rehab OT Goals Patient Stated Goal: Patient wants to return home with family and be independent OT Goal Formulation: With patient/family Time For Goal Achievement:  03/09/18 Potential to Achieve Goals: Good ADL Goals Pt Will Perform Lower Body Dressing: with modified independence Pt Will Transfer to Toilet: with modified independence  OT Frequency: Min 1X/week   Barriers to D/C:            Co-evaluation              AM-PAC OT "6 Clicks" Daily Activity     Outcome Measure Help from another person eating meals?: None Help from another person taking care of personal grooming?: None Help from another person toileting, which includes using toliet, bedpan, or urinal?: A Little Help from another person bathing (including washing, rinsing, drying)?: A Little Help from another person to put on and taking off regular upper body clothing?: None Help from another person to put on and taking off regular lower body clothing?: A Lot 6 Click Score: 20   End of Session    Activity Tolerance: Patient tolerated treatment well Patient left: in chair;with call bell/phone within reach;with chair alarm set  OT Visit Diagnosis: Muscle weakness (generalized) (M62.81)                Time: 3664-4034 OT Time Calculation (min): 27 min Charges:  OT General  Charges $OT Visit: 1 Visit OT Evaluation $OT Eval Low Complexity: 1 Low OT Treatments $Self Care/Home Management : 8-22 mins  Shamikia Linskey T Jabree Pernice, OTR/L, CLT   Thessaly Mccullers 03/02/2018, 11:20 AM

## 2018-03-02 NOTE — Care Management Note (Signed)
Case Management Note  Patient Details  Name: Daelynn Blower MRN: [id number] Date of Birth: [birth year]  Subjective/Objective:  Patient to be discharged per MD order. Orders in place for home health services. Patient is post op from a hip replacement. CMS Medicare.gov Compare Post Acute Care list reviewed with patient and she used [facility] for her other joint replacement surgery. Referral to [facility] and Brenton Grills is aware of referral for PT services. Patient has rolling walker, cane and bedside commode in home from previous surgery. Family to transport.                    Action/Plan:   Expected Discharge Date:                  Expected Discharge Plan:  [place]  In-House Referral:     Discharge planning Services  CM Consult  Post Acute Care Choice:    Choice offered to:  Patient, Adult Children  DME Arranged:    DME Agency:     HH Arranged:  PT [place]:  [place]  Status of Service:  Completed, signed off  If discussed at [place] of Stay Meetings, dates discussed:    Additional Comments:  Latanya Maudlin, RN 03/02/2018, 10:16 AM

## 2018-03-02 NOTE — Progress Notes (Signed)
ANTICOAGULATION CONSULT NOTE  Pharmacy Consult for Warfarin Management  Indication: atrial fibrillation  Allergies  Allergen Reactions  . Contrast Media [Iodinated Diagnostic Agents] Rash    Contrast dye causes swelling    Patient Measurements: Height: 5\' 1"  (154.9 cm) Weight: 191 lb (86.6 kg) IBW/kg (Calculated) : 47.8  Vital Signs: Temp: 98.2 F (36.8 C) (01/04 0757) Temp Source: Oral (01/04 0757) BP: 118/73 (01/04 0757) Pulse Rate: 82 (01/04 0757)  Labs: Recent Labs    03/01/18 1000 03/01/18 1746 03/02/18 0440  HGB  --  14.3 11.9*  HCT  --  47.2* 37.4  PLT  --  126* 116*  LABPROT 13.8  --  14.9  INR 1.07  --  1.18    Estimated Creatinine Clearance: 43.7 mL/min (A) (by C-G formula based on SCr of 1.06 mg/dL (H)).   Medical History: Past Medical History:  Diagnosis Date  . Anxiety   . Aortic insufficiency    Trivial  . Arthritis   . Asthma   . Bronchitis   . Bursitis, hip   . Claustrophobia   . Claustrophobia   . Dysrhythmia    a fib  . GERD (gastroesophageal reflux disease)    history of  . Gout   . Hyperlipidemia   . Hypertension   . Hypothyroidism   . Mitral regurgitation   . Obesity   . Panic attacks   . Paroxysmal atrial fibrillation (HCC)   . PTSD (post-traumatic stress disorder)   . Rhinitis   . Sick sinus syndrome ([place])   . Sleep apnea    was ordered CPAP, but never fitted for one years ago, Dr has retired    Medications:  Scheduled:  . allopurinol  100 mg Oral Daily  . celecoxib  200 mg Oral BID  . ferrous sulfate  325 mg Oral BID WC  . gabapentin  300 mg Oral QHS  . liothyronine  15 mcg Oral Daily  . magnesium hydroxide  30 mL Oral Daily  . metoCLOPramide  10 mg Oral TID AC & HS  . metoprolol tartrate  75 mg Oral BID  . pantoprazole  40 mg Oral BID  . senna-docusate  1 tablet Oral BID  . spironolactone  100 mg Oral Daily  . thyroid  30 mg Oral QAC breakfast  . warfarin  5 mg Oral q1800  . Warfarin - Pharmacist Dosing  Inpatient   Does not apply q1800   Infusions:  . sodium chloride 100 mL/hr at 03/02/18 0400  . acetaminophen 1,000 mg (03/02/18 0635)  .  ceFAZolin (ANCEF) IV 2 g (03/02/18 0554)    Assessment: Pharmacy consulted for warfarin management for 78 yo female admitted for surgery secondary to implant failure/lossening status post left total hip arthroplasty. Patient takes warfarin 2.5mg  for goal INR of 2-3 as an outpatient. Patient's last presurgical dose of warfarin was on 12/29.   Goal of Therapy:  INR 2-3 Monitor platelets by anticoagulation protocol: Yes   Plan:  Patient received warfarin 4mg  on 1/4. Will continue patient on warfarin 5mg  q1800. Will obtain daily INRs while patient is admitted. Patient received TXA on 1/4 and is on potentially interacting medications of cefazolin, allopurinol, celecoxib, liothyronine, and thyroid tablets. Per PA note, patient is wearing foot pumps and TED hose for DVT prophylaxis.   Pharmacy will continue to monitor and adjust per consult.   Briana Farner L 03/02/2018,8:06 AM

## 2018-03-03 LAB — CBC
HCT: 37.7 % (ref 36.0–46.0)
Hemoglobin: 11.9 g/dL — ABNORMAL LOW (ref 12.0–15.0)
MCH: 31.2 pg (ref 26.0–34.0)
MCHC: 31.6 g/dL (ref 30.0–36.0)
MCV: 99 fL (ref 80.0–100.0)
Platelets: 128 10*3/uL — ABNORMAL LOW (ref 150–400)
RBC: 3.81 MIL/uL — AB (ref 3.87–5.11)
RDW: 14.2 % (ref 11.5–15.5)
WBC: 7.1 10*3/uL (ref 4.0–10.5)
nRBC: 0 % (ref 0.0–0.2)

## 2018-03-03 LAB — BASIC METABOLIC PANEL
Anion gap: 6 (ref 5–15)
BUN: 24 mg/dL — ABNORMAL HIGH (ref 8–23)
CHLORIDE: 111 mmol/L (ref 98–111)
CO2: 22 mmol/L (ref 22–32)
Calcium: 8.8 mg/dL — ABNORMAL LOW (ref 8.9–10.3)
Creatinine, Ser: 1.05 mg/dL — ABNORMAL HIGH (ref 0.44–1.00)
GFR calc Af Amer: 59 mL/min — ABNORMAL LOW (ref >60)
GFR calc non Af Amer: 51 mL/min — ABNORMAL LOW (ref >60)
Glucose, Bld: 76 mg/dL (ref 70–99)
POTASSIUM: 4.1 mmol/L (ref 3.5–5.1)
Sodium: 139 mmol/L (ref 135–145)

## 2018-03-03 LAB — PROTIME-INR
INR: 1.69
Prothrombin Time: 19.7 seconds — ABNORMAL HIGH (ref 11.4–15.2)

## 2018-03-03 MED ORDER — WARFARIN SODIUM 2.5 MG PO TABS
2.5000 mg | ORAL_TABLET | Freq: Every day | ORAL | Status: DC
  Filled 2018-03-03: qty 1

## 2018-03-03 MED ORDER — TRAMADOL HCL 50 MG PO TABS: 50.0000 mg | ORAL_TABLET | Freq: Four times a day (QID) | ORAL | 0 refills | Status: DC | PRN

## 2018-03-03 NOTE — Progress Notes (Signed)
ANTICOAGULATION CONSULT NOTE  Pharmacy Consult for Warfarin Management  Indication: atrial fibrillation  Allergies  Allergen Reactions  . Contrast Media [Iodinated Diagnostic Agents] Rash    Contrast dye causes swelling    Patient Measurements: Height: 5\' 1"  (154.9 cm) Weight: 191 lb (86.6 kg) IBW/kg (Calculated) : 47.8  Vital Signs: BP: 122/52 (01/05 0912) Pulse Rate: 65 (01/05 0912)  Labs: Recent Labs    03/01/18 1000  03/01/18 1746 03/02/18 0440 03/02/18 0441 03/03/18 0830  HGB  --    < > 14.3 11.9*  --  11.9*  HCT  --   --  47.2* 37.4  --  37.7  PLT  --   --  126* 116*  --  128*  LABPROT 13.8  --   --  14.9  --  19.7*  INR 1.07  --   --  1.18  --  1.69  CREATININE  --   --   --   --  1.04* 1.05*   < > = values in this interval not displayed.    Estimated Creatinine Clearance: 44.1 mL/min (A) (by C-G formula based on SCr of 1.05 mg/dL (H)).   Medical History: Past Medical History:  Diagnosis Date  . Anxiety   . Aortic insufficiency    Trivial  . Arthritis   . Asthma   . Bronchitis   . Bursitis, hip   . Claustrophobia   . Claustrophobia   . Dysrhythmia    a fib  . GERD (gastroesophageal reflux disease)    history of  . Gout   . Hyperlipidemia   . Hypertension   . Hypothyroidism   . Mitral regurgitation   . Obesity   . Panic attacks   . Paroxysmal atrial fibrillation (HCC)   . PTSD (post-traumatic stress disorder)   . Rhinitis   . Sick sinus syndrome ([place])   . Sleep apnea    was ordered CPAP, but never fitted for one years ago, Dr has retired    Medications:  Scheduled:  . allopurinol  100 mg Oral Daily  . celecoxib  200 mg Oral BID  . ferrous sulfate  325 mg Oral BID WC  . gabapentin  300 mg Oral QHS  . liothyronine  15 mcg Oral Daily  . magnesium hydroxide  30 mL Oral Daily  . metoCLOPramide  10 mg Oral TID AC & HS  . metoprolol tartrate  75 mg Oral BID  . pantoprazole  40 mg Oral BID  . senna-docusate  1 tablet Oral BID  .  spironolactone  100 mg Oral Daily  . thyroid  30 mg Oral QAC breakfast  . warfarin  5 mg Oral q1800  . Warfarin - Pharmacist Dosing Inpatient   Does not apply q1800   Infusions:  . sodium chloride 100 mL/hr at 03/02/18 1128    Assessment: Pharmacy consulted for warfarin management for 78 yo female admitted for surgery secondary to implant failure/lossening status post left total hip arthroplasty. Patient takes warfarin 2.5mg  for goal INR of 2-3 as an outpatient. Patient's last presurgical dose of warfarin was on 12/29.   Goal of Therapy:  INR 2-3 Monitor platelets by anticoagulation protocol: Yes   Plan:  INR significantly up form 1/4; 1.18 to 1.69. Will resume home dose of warfarin 2.5mg  Daily.  Will obtain daily INRs while patient is admitted. Patient received TXA on 1/4 and is on potentially interacting medications of cefazolin, allopurinol, celecoxib, liothyronine, and thyroid tablets. Per PA note, patient is wearing foot  pumps and TED hose for DVT prophylaxis.   Pharmacy will continue to monitor and adjust per consult.   Simpson,Michael L 03/03/2018,1:43 PM

## 2018-03-03 NOTE — Progress Notes (Signed)
Subjective: 2 Days Post-Op Procedure(s) (LRB): TOTAL HIP REVISION (Left) Patient reports pain as mild.   Patient is well, and has had no acute complaints or problems Current plan is for discharge home with HHPT. Walked 150 feet yesterday, needs to clear stairs this morning. Negative for chest pain and shortness of breath Fever: no Gastrointestinal:Negative for nausea and vomiting  Objective: Vital signs in last 24 hours: Temp:  [97.7 F (36.5 C)-97.8 F (36.6 C)] 97.8 F (36.6 C) (01/04 2324) Pulse Rate:  [65-115] 65 (01/05 0912) Resp:  [14-18] 18 (01/05 0912) BP: (95-122)/(52-88) 122/52 (01/05 0912) SpO2:  [95 %-99 %] 98 % (01/05 0912)  Intake/Output from previous day:  Intake/Output Summary (Last 24 hours) at 03/03/2018 0927 Last data filed at 03/03/2018 0500 Gross per 24 hour  Intake 600 ml  Output -  Net 600 ml    Intake/Output this shift: No intake/output data recorded.  Labs: Recent Labs    03/01/18 1746 03/02/18 0440 03/03/18 0830  HGB 14.3 11.9* 11.9*   Recent Labs    03/02/18 0440 03/03/18 0830  WBC 4.3 7.1  RBC 3.82* 3.81*  HCT 37.4 37.7  PLT 116* 128*   Recent Labs    03/02/18 0441 03/03/18 0830  NA 134* 139  K 4.6 4.1  CL 106 111  CO2 21* 22  BUN 25* 24*  CREATININE 1.04* 1.05*  GLUCOSE 130* 76  CALCIUM 8.3* 8.8*   Recent Labs    03/02/18 0440 03/03/18 0830  INR 1.18 1.69     EXAM General - Patient is Alert, Appropriate and Oriented Extremity - ABD soft Sensation intact distally Intact pulses distally Dorsiflexion/Plantar flexion intact Incision: dressing C/D/I No cellulitis present Dressing/Incision - clean, dry, no drainage Motor Function - intact, moving foot and toes well on exam.  Abdomen soft with normal BS.  Past Medical History:  Diagnosis Date  . Anxiety   . Aortic insufficiency    Trivial  . Arthritis   . Asthma   . Bronchitis   . Bursitis, hip   . Claustrophobia   . Claustrophobia   . Dysrhythmia    a  fib  . GERD (gastroesophageal reflux disease)    history of  . Gout   . Hyperlipidemia   . Hypertension   . Hypothyroidism   . Mitral regurgitation   . Obesity   . Panic attacks   . Paroxysmal atrial fibrillation (HCC)   . PTSD (post-traumatic stress disorder)   . Rhinitis   . Sick sinus syndrome ([place])   . Sleep apnea    was ordered CPAP, but never fitted for one years ago, Dr has retired   Assessment/Plan: 2 Days Post-Op Procedure(s) (LRB): TOTAL HIP REVISION (Left) Active Problems:   S/P revision of total hip  Estimated body mass index is 36.09 kg/m as calculated from the following:   Height as of this encounter: 5\' 1"  (1.549 m).   Weight as of this encounter: 86.6 kg. Advance diet Up with therapy D/C IV fluids when tolerating increased po intake.  BP 122/52, can hold BP medications this AM. Labs reviewed this AM. Pt is passing gas without pain, has not had a bowel movement yet.  Move on to suppository today. Up with therapy today for stairs. Plan will be for discharge home this afternoon with HHPT. Will need PT/INR checks at home.  DVT Prophylaxis - Coumadin, Foot Pumps and TED hose Weight-Bearing as tolerated to left leg  J. Cameron Proud, PA-C [facility] Orthopaedic Surgery 03/03/2018,  9:27 AM

## 2018-03-03 NOTE — Plan of Care (Signed)
  Problem: Spiritual Needs Goal: Ability to function at adequate level 03/03/2018 0558 by Harlee Pursifull, Lucille Passy, RN Outcome: Progressing 03/03/2018 0558 by Robbyn Hodkinson, Lucille Passy, RN Outcome: Progressing   Problem: Education: Goal: Knowledge of the prescribed therapeutic regimen will improve Outcome: Progressing Goal: Understanding of discharge needs will improve Outcome: Progressing Goal: Individualized Educational Video(s) Outcome: Progressing   Problem: Activity: Goal: Ability to avoid complications of mobility impairment will improve Outcome: Progressing Goal: Ability to tolerate increased activity will improve Outcome: Progressing   Problem: Clinical Measurements: Goal: Postoperative complications will be avoided or minimized Outcome: Progressing   Problem: Skin Integrity: Goal: Will show signs of wound healing Outcome: Progressing   Problem: Pain Management: Goal: Pain level will decrease with appropriate interventions Outcome: Progressing

## 2018-03-03 NOTE — Discharge Summary (Signed)
Physician Discharge Summary  Patient ID: Marcene Laskowski MRN: [id number] DOB/AGE: Jul 04, 1940 78 y.o.  Admit date: 03/01/2018 Discharge date: 03/03/2018  Admission Diagnoses:  SP left total hip arthroplasty  Discharge Diagnoses: Patient Active Problem List   Diagnosis Date Noted  . S/P revision of total hip 03/01/2018  . Hematuria 10/16/2017  . Encounter for therapeutic drug monitoring 04/19/2017  . Disorder of bone, unspecified 03/07/2017  . Other specified health status 03/07/2017  . CKD (chronic kidney disease) stage 3, GFR 30-59 ml/min (HCC) 05/11/2016  . Anemia of chronic disease 05/11/2016  . Hypocalcemia 05/11/2016  . Chronic pain syndrome 02/14/2016  . Status post total replacement of left hip 02/06/2016  . S/P total hip arthroplasty 12/22/2015  . Hypothyroidism (acquired) 12/01/2015  . Long term (current) use of opiate analgesic 12/01/2015  . Morbid obesity with BMI of 40.0-44.9, adult ([place]) 12/01/2015  . PTSD (post-traumatic stress disorder) 12/01/2015  . Spinal stenosis of lumbar region 12/01/2015  . Opioid-induced constipation (OIC) 09/30/2015  . Avascular necrosis of left femoral head ([place]) 08/17/2015  . Chronic hip pain (Location of Primary Source of Pain) (Left) 08/11/2015  . Long term current use of opiate analgesic 08/11/2015  . Long term prescription opiate use 08/11/2015  . Opiate use (54 MME/Day) 08/11/2015  . Encounter for therapeutic drug level monitoring 08/11/2015  . Disturbance of skin sensation 08/11/2015  . Osteoarthritis of hip (Left) 08/11/2015  . Trochanteric bursitis of hip (Left) 08/11/2015  . Lumbar central spinal stenosis (L4-5) 08/11/2015  . Lumbar facet arthropathy ([place]) 08/11/2015  . 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 Atrial fibrillation ([place]) 02/16/2015  . GERD (gastroesophageal reflux disease) 10/27/2010  . Cough   . Cardiac pacemaker in situ 06/05/2008  . HYPOTHYROIDISM 06/04/2008  .  HYPERLIPIDEMIA 06/04/2008  . OBESITY 06/04/2008  . Essential hypertension 06/04/2008  . SICK SINUS SYNDROME 06/04/2008  . Sick sinus syndrome ([place]) 06/04/2008    Past Medical History:  Diagnosis Date  . Anxiety   . Aortic insufficiency    Trivial  . Arthritis   . Asthma   . Bronchitis   . Bursitis, hip   . Claustrophobia   . Claustrophobia   . Dysrhythmia    a fib  . GERD (gastroesophageal reflux disease)    history of  . Gout   . Hyperlipidemia   . Hypertension   . Hypothyroidism   . Mitral regurgitation   . Obesity   . Panic attacks   . Paroxysmal atrial fibrillation (HCC)   . PTSD (post-traumatic stress disorder)   . Rhinitis   . Sick sinus syndrome ([place])   . Sleep apnea    was ordered CPAP, but never fitted for one years ago, Dr has retired   Transfusion: None.   Consultants (if any):   Discharged Condition: Improved  Hospital Course: Joeline Freer is an 78 y.o. female who was admitted 03/01/2018 with a diagnosis of implant failure/loosening status post left total hip arthoplasty and went to the operating room on 03/01/2018 and underwent the above named procedures.    Surgeries: Procedure(s): TOTAL HIP REVISION on 03/01/2018 Patient tolerated the surgery well. Taken to PACU where she was stabilized and then transferred to the orthopedic floor.  Continued on Warfarin 5mg  daily while admitted.. Foot pumps applied bilaterally at 80 mm. Heels elevated on bed with rolled towels. No evidence of DVT. Negative [place]. Physical therapy started on day #1 for gait training and transfer. OT started day #1 for ADL and  assisted devices.  Patient's IV was removed on POD2, foley removed on POD1.  Implants: DePuy  56 mm OD Pinnacle Gription multihole acetabular component, 3 -6.5 mm cancellus bone screws (15 mm, 15 mm, 30 mm), +4 mm neutral Marathon polyethylene insert, and a 36 mm M-SPEC +5 mm hip ball.  She was given perioperative antibiotics:  Anti-infectives (From admission,  onward)   Start     Dose/Rate Route Frequency Ordered Stop   03/01/18 1715  ceFAZolin (ANCEF) IVPB 2g/100 mL premix     2 g 200 mL/hr over 30 Minutes Intravenous Every 6 hours 03/01/18 1712 03/02/18 1312   03/01/18 0852  ceFAZolin (ANCEF) 2-4 GM/100ML-% IVPB    Note to Pharmacy:  Phineas Real   : cabinet override      03/01/18 0852 03/01/18 1135   03/01/18 0600  ceFAZolin (ANCEF) IVPB 2g/100 mL premix     2 g 200 mL/hr over 30 Minutes Intravenous On call to O.R. 02/28/18 2222 03/01/18 1205    .  She was given sequential compression devices, early ambulation, and Warfarin for DVT prophylaxis.  She benefited maximally from the hospital stay and there were no complications.    Recent vital signs:  Vitals:   03/02/18 2324 03/03/18 0912  BP: 95/72 (!) 122/52  Pulse: (!) 115 65  Resp: 14 18  Temp: 97.8 F (36.6 C)   SpO2: 95% 98%    Recent laboratory studies:  Lab Results  Component Value Date   HGB 11.9 (L) 03/03/2018   HGB 11.9 (L) 03/02/2018   HGB 14.3 03/01/2018   Lab Results  Component Value Date   WBC 7.1 03/03/2018   PLT 128 (L) 03/03/2018   Lab Results  Component Value Date   INR 1.69 03/03/2018   Lab Results  Component Value Date   NA 139 03/03/2018   K 4.1 03/03/2018   CL 111 03/03/2018   CO2 22 03/03/2018   BUN 24 (H) 03/03/2018   CREATININE 1.05 (H) 03/03/2018   GLUCOSE 76 03/03/2018    Discharge Medications:   Allergies as of 03/03/2018      Reactions   Contrast Media [iodinated Diagnostic Agents] Rash   Contrast dye causes swelling      Medication List    TAKE these medications   allopurinol 100 MG tablet Commonly known as:  ZYLOPRIM Take 1 tablet by mouth daily.   colchicine 0.6 MG tablet Take 0.6 mg by mouth daily as needed (GOUT).   diazepam 10 MG tablet Commonly known as:  VALIUM Take 10 mg by mouth 2 (two) times daily as needed for anxiety.   liothyronine 5 MCG tablet Commonly known as:  CYTOMEL Take 15 mcg by mouth daily.    metoprolol tartrate 50 MG tablet Commonly known as:  LOPRESSOR Take 75 mg by mouth 2 (two) times daily.   oxyCODONE 5 MG immediate release tablet Commonly known as:  Oxy IR/ROXICODONE Take 1 tablet (5 mg total) by mouth 5 (five) times daily.   oxyCODONE 5 MG immediate release tablet Commonly known as:  Oxy IR/ROXICODONE Take 1 tablet (5 mg total) by mouth 5 (five) times daily as needed for severe pain. Start taking on:  April 01, 2018   oxyCODONE 5 MG immediate release tablet Commonly known as:  Oxy IR/ROXICODONE Take 1 tablet (5 mg total) by mouth 5 (five) times daily as needed for severe pain. Start taking on:  May 01, 2018   spironolactone 100 MG tablet Commonly known as:  ALDACTONE Take  100 mg by mouth daily.   thyroid 30 MG tablet Commonly known as:  ARMOUR Take 30 mg by mouth daily before breakfast.   traMADol 50 MG tablet Commonly known as:  ULTRAM Take 1 tablet (50 mg total) by mouth every 6 (six) hours as needed for moderate pain.   warfarin 2.5 MG tablet Commonly known as:  COUMADIN Take as directed. If you are unsure how to take this medication, talk to your nurse or doctor. Original instructions:  Take 1 tablet (2.5 mg total) by mouth daily.            Durable Medical Equipment  (From admission, onward)         Start     Ordered   03/01/18 1713  DME Walker rolling  Once    Question:  Patient needs a walker to treat with the following condition  Answer:  S/P total hip arthroplasty   03/01/18 1712   03/01/18 1713  DME Bedside commode  Once    Question:  Patient needs a bedside commode to treat with the following condition  Answer:  S/P total hip arthroplasty   03/01/18 1712          Diagnostic Studies: Dg Hip Port Unilat With Pelvis 1v Left  Result Date: 03/01/2018 CLINICAL DATA:  Postop left total hip arthroplasty EXAM: DG HIP (WITH OR WITHOUT PELVIS) 1V PORT LEFT COMPARISON:  12/22/2015 FINDINGS: Revision left total hip arthroplasty is in  place. There is anatomic alignment of the osseous and prostatic structures. There is no breakage or loosening of the hardware. Surgical staples project over the surgical bed. IMPRESSION: Left revision total hip arthroplasty anatomically aligned. Electronically Signed   By: Marybelle Killings M.D.   On: 03/01/2018 15:47   Disposition: Plan will be for discharge home today pending progress during morning session of PT.  Will need PT/INR checks upon returning home starting on 03/04/18 for warfarin dosing.  Follow-up Information    Hooten, Laurice Record, MD On 04/11/2018.   Specialty:  Orthopedic Surgery Why:  at 3:30pm Contact information: [place] [address] [phone]          Signed: Judson Roch PA-C 03/03/2018, 9:32 AM

## 2018-03-03 NOTE — Progress Notes (Signed)
Physical Therapy Treatment Patient Details Name: [name] MRN: [id number] DOB: 07/17/1940 Today's Date: 03/03/2018    History of Present Illness 78 year old female here for L THA revision. PMH to include anxiety, OA, gerd, HTN, HLD, Hypothyroid, obesity.     PT Comments    Pain well managed. Posterior hip precautions cited upon demand. AMB progressed to 218ft c RW, household appropriate gait speed. Performance of 6 steps achieved with relative ease and high confidence. Pt progressing well, safe for DC to home from PT standpoint.    Follow Up Recommendations  Home health PT;Supervision - Intermittent     Equipment Recommendations  None recommended by PT    Recommendations for Other Services       Precautions / Restrictions Precautions Precautions: Posterior Hip Precaution Comments: Patient able to state 3/3 hip precautions Restrictions LLE Weight Bearing: Weight bearing as tolerated    Mobility  Bed Mobility Overal bed mobility: Modified Independent                Transfers Overall transfer level: Modified independent Equipment used: Rolling walker (2 wheeled)                Ambulation/Gait   Gait Distance (Feet): 240 Feet Assistive device: Rolling walker (2 wheeled) Gait Pattern/deviations: WFL(Within Functional Limits);Step-through pattern;Narrow base of support Gait velocity: 0.86m/s        Stairs Stairs: Yes Stairs assistance: Supervision Stair Management: One rail Left;Step to pattern Number of Stairs: 6 General stair comments: generally pleased with offered technique, easy application and adoption    Wheelchair Mobility    Modified Rankin (Stroke Patients Only)       Balance Overall balance assessment: Mild deficits observed, not formally tested;Modified Independent                                          Cognition Arousal/Alertness: Awake/alert Behavior During Therapy: WFL for tasks  assessed/performed Overall Cognitive Status: Within Functional Limits for tasks assessed                                        Exercises Total Joint Exercises Heel Slides: AROM;Left;15 reps;Supine Hip ABduction/ADduction: AROM;Left;15 reps;Supine    General Comments        Pertinent Vitals/Pain Pain Assessment: 0-10 Pain Score: 3  Pain Descriptors / Indicators: Sore Pain Intervention(s): Limited activity within patient's tolerance;Monitored during session;Premedicated before session    Home Living                      Prior Function            PT Goals (current goals can now be found in the care plan section) Acute Rehab PT Goals Patient Stated Goal: Patient wants to return home with family and be independent PT Goal Formulation: With patient Time For Goal Achievement: 03/16/18 Potential to Achieve Goals: Good Progress towards PT goals: Progressing toward goals    Frequency    BID      PT Plan Current plan remains appropriate    Co-evaluation              AM-PAC PT "6 Clicks" Mobility   Outcome Measure  Help needed turning from your back to your side while in a flat bed without using bedrails?:  A Little Help needed moving from lying on your back to sitting on the side of a flat bed without using bedrails?: A Little Help needed moving to and from a bed to a chair (including a wheelchair)?: A Little Help needed standing up from a chair using your arms (e.g., wheelchair or bedside chair)?: A Little Help needed to walk in hospital room?: A Little Help needed climbing 3-5 steps with a railing? : A Little 6 Click Score: 18    End of Session Equipment Utilized During Treatment: Gait belt Activity Tolerance: Patient tolerated treatment well;No increased pain Patient left: with call bell/phone within reach;with family/visitor present;in chair   PT Visit Diagnosis: Muscle weakness (generalized) (M62.81);Pain Pain - Right/Left:  Left Pain - part of body: Hip     Time: 8366-2947 PT Time Calculation (min) (ACUTE ONLY): 23 min  Charges:  $Gait Training: 8-22 mins $Therapeutic Activity: 8-22 mins                     10:53 AM, 03/03/18 Etta Grandchild, PT, DPT Physical Therapist - [facility]  [phone] ([place])     Rielly Brunn C 03/03/2018, 10:51 AM

## 2018-03-03 NOTE — Progress Notes (Signed)
Nsg Discharge Note  Admit Date:  03/01/2018 Discharge date: 03/03/2018   [name] to be D/C'd Home per MD order.  AVS completed.  Copy for chart, and copy for patient signed, and dated. Patient/caregiver able to verbalize understanding.  Discharge Medication: Allergies as of 03/03/2018      Reactions   Contrast Media [iodinated Diagnostic Agents] Rash   Contrast dye causes swelling      Medication List    TAKE these medications   allopurinol 100 MG tablet Commonly known as:  ZYLOPRIM Take 1 tablet by mouth daily.   colchicine 0.6 MG tablet Take 0.6 mg by mouth daily as needed (GOUT).   diazepam 10 MG tablet Commonly known as:  VALIUM Take 10 mg by mouth 2 (two) times daily as needed for anxiety.   liothyronine 5 MCG tablet Commonly known as:  CYTOMEL Take 15 mcg by mouth daily.   metoprolol tartrate 50 MG tablet Commonly known as:  LOPRESSOR Take 75 mg by mouth 2 (two) times daily.   oxyCODONE 5 MG immediate release tablet Commonly known as:  Oxy IR/ROXICODONE Take 1 tablet (5 mg total) by mouth 5 (five) times daily.   oxyCODONE 5 MG immediate release tablet Commonly known as:  Oxy IR/ROXICODONE Take 1 tablet (5 mg total) by mouth 5 (five) times daily as needed for severe pain. Start taking on:  April 01, 2018   oxyCODONE 5 MG immediate release tablet Commonly known as:  Oxy IR/ROXICODONE Take 1 tablet (5 mg total) by mouth 5 (five) times daily as needed for severe pain. Start taking on:  May 01, 2018   spironolactone 100 MG tablet Commonly known as:  ALDACTONE Take 100 mg by mouth daily.   thyroid 30 MG tablet Commonly known as:  ARMOUR Take 30 mg by mouth daily before breakfast.   traMADol 50 MG tablet Commonly known as:  ULTRAM Take 1 tablet (50 mg total) by mouth every 6 (six) hours as needed for moderate pain.   warfarin 2.5 MG tablet Commonly known as:  COUMADIN Take as directed. If you are unsure how to take this medication, talk to your nurse  or doctor. Original instructions:  Take 1 tablet (2.5 mg total) by mouth daily.            Durable Medical Equipment  (From admission, onward)         Start     Ordered   03/01/18 1713  DME Walker rolling  Once    Question:  Patient needs a walker to treat with the following condition  Answer:  S/P total hip arthroplasty   03/01/18 1712   03/01/18 1713  DME Bedside commode  Once    Question:  Patient needs a bedside commode to treat with the following condition  Answer:  S/P total hip arthroplasty   03/01/18 1712          Discharge Assessment: Vitals:   03/02/18 2324 03/03/18 0912  BP: 95/72 (!) 122/52  Pulse: (!) 115 65  Resp: 14 18  Temp: 97.8 F (36.6 C)   SpO2: 95% 98%   Skin clean, dry and intact without evidence of skin break down, no evidence of skin tears noted. IV catheter discontinued intact. Site without signs and symptoms of complications - no redness or edema noted at insertion site, patient denies c/o pain - only slight tenderness at site.  Dressing with slight pressure applied.  D/c Instructions-Education: Discharge instructions given to patient/family with verbalized understanding. D/c education completed  with patient/family including follow up instructions, medication list, d/c activities limitations if indicated, with other d/c instructions as indicated by MD - patient able to verbalize understanding, all questions fully answered. Patient instructed to return to ED, call 911, or call MD for any changes in condition.  Patient escorted via [place], and D/C home via private auto.  Eda Keys, RN 03/03/2018 2:16 PM

## 2018-03-03 NOTE — Progress Notes (Signed)
Patient to be discharged today. Referral with [facility] yesterday. No DME needs. Family to transport.

## 2018-03-03 NOTE — Plan of Care (Signed)
  Problem: Spiritual Needs Goal: Ability to function at adequate level Outcome: Progressing   

## 2018-03-04 ENCOUNTER — Encounter: Payer: Self-pay | Admitting: Orthopedic Surgery

## 2018-03-04 ENCOUNTER — Ambulatory Visit (INDEPENDENT_AMBULATORY_CARE_PROVIDER_SITE_OTHER): Payer: Medicare Other

## 2018-03-04 DIAGNOSIS — Z79891 Long term (current) use of opiate analgesic: Secondary | ICD-10-CM | POA: Diagnosis not present

## 2018-03-04 DIAGNOSIS — D631 Anemia in chronic kidney disease: Secondary | ICD-10-CM | POA: Diagnosis not present

## 2018-03-04 DIAGNOSIS — Z7901 Long term (current) use of anticoagulants: Secondary | ICD-10-CM | POA: Diagnosis not present

## 2018-03-04 DIAGNOSIS — E669 Obesity, unspecified: Secondary | ICD-10-CM | POA: Diagnosis not present

## 2018-03-04 DIAGNOSIS — Z5181 Encounter for therapeutic drug level monitoring: Secondary | ICD-10-CM | POA: Diagnosis not present

## 2018-03-04 DIAGNOSIS — T84031D Mechanical loosening of internal left hip prosthetic joint, subsequent encounter: Secondary | ICD-10-CM | POA: Diagnosis not present

## 2018-03-04 DIAGNOSIS — I4891 Unspecified atrial fibrillation: Secondary | ICD-10-CM

## 2018-03-04 DIAGNOSIS — Z79899 Other long term (current) drug therapy: Secondary | ICD-10-CM | POA: Diagnosis not present

## 2018-03-04 DIAGNOSIS — I129 Hypertensive chronic kidney disease with stage 1 through stage 4 chronic kidney disease, or unspecified chronic kidney disease: Secondary | ICD-10-CM | POA: Diagnosis not present

## 2018-03-04 DIAGNOSIS — N183 Chronic kidney disease, stage 3 (moderate): Secondary | ICD-10-CM | POA: Diagnosis not present

## 2018-03-04 DIAGNOSIS — T84091D Other mechanical complication of internal left hip prosthesis, subsequent encounter: Secondary | ICD-10-CM | POA: Diagnosis not present

## 2018-03-04 DIAGNOSIS — Z96642 Presence of left artificial hip joint: Secondary | ICD-10-CM | POA: Diagnosis not present

## 2018-03-04 DIAGNOSIS — Z950 Presence of cardiac pacemaker: Secondary | ICD-10-CM | POA: Diagnosis not present

## 2018-03-04 LAB — POCT INR: INR: 1.9 — AB (ref 2.0–3.0)

## 2018-03-04 NOTE — Patient Instructions (Signed)
Pt s/p hip revision surgery - Gerald Stabs w/ [facility] calling w/ results [phone]. Advised him to have pt continue dosage of 2.5 mg every day. Recheck on Wed - he has orders to check Mon, Wed, Fri this week.

## 2018-03-05 ENCOUNTER — Encounter: Payer: Medicare Other | Admitting: Internal Medicine

## 2018-03-06 ENCOUNTER — Ambulatory Visit (INDEPENDENT_AMBULATORY_CARE_PROVIDER_SITE_OTHER): Payer: Medicare Other

## 2018-03-06 DIAGNOSIS — I4891 Unspecified atrial fibrillation: Secondary | ICD-10-CM | POA: Diagnosis not present

## 2018-03-06 DIAGNOSIS — T84091D Other mechanical complication of internal left hip prosthesis, subsequent encounter: Secondary | ICD-10-CM | POA: Diagnosis not present

## 2018-03-06 DIAGNOSIS — Z5181 Encounter for therapeutic drug level monitoring: Secondary | ICD-10-CM

## 2018-03-06 DIAGNOSIS — I129 Hypertensive chronic kidney disease with stage 1 through stage 4 chronic kidney disease, or unspecified chronic kidney disease: Secondary | ICD-10-CM | POA: Diagnosis not present

## 2018-03-06 DIAGNOSIS — Z96642 Presence of left artificial hip joint: Secondary | ICD-10-CM | POA: Diagnosis not present

## 2018-03-06 DIAGNOSIS — T84031D Mechanical loosening of internal left hip prosthetic joint, subsequent encounter: Secondary | ICD-10-CM | POA: Diagnosis not present

## 2018-03-06 LAB — POCT INR: INR: 1.4 — AB (ref 2.0–3.0)

## 2018-03-06 NOTE — Patient Instructions (Signed)
Pt s/p hip revision surgery - Gerald Stabs w/ [facility] calling w/ results [phone]. Advised him to have pt take 2 tablets today and tomorrow, then continue dosage of 2.5 mg every day. Recheck in 1 week.

## 2018-03-07 LAB — AEROBIC/ANAEROBIC CULTURE W GRAM STAIN (SURGICAL/DEEP WOUND)
Culture: NO GROWTH
Gram Stain: NONE SEEN

## 2018-03-08 DIAGNOSIS — Z96642 Presence of left artificial hip joint: Secondary | ICD-10-CM | POA: Diagnosis not present

## 2018-03-08 DIAGNOSIS — I129 Hypertensive chronic kidney disease with stage 1 through stage 4 chronic kidney disease, or unspecified chronic kidney disease: Secondary | ICD-10-CM | POA: Diagnosis not present

## 2018-03-08 DIAGNOSIS — T84091D Other mechanical complication of internal left hip prosthesis, subsequent encounter: Secondary | ICD-10-CM | POA: Diagnosis not present

## 2018-03-08 DIAGNOSIS — Z5181 Encounter for therapeutic drug level monitoring: Secondary | ICD-10-CM | POA: Diagnosis not present

## 2018-03-08 DIAGNOSIS — I4891 Unspecified atrial fibrillation: Secondary | ICD-10-CM | POA: Diagnosis not present

## 2018-03-08 DIAGNOSIS — T84031D Mechanical loosening of internal left hip prosthetic joint, subsequent encounter: Secondary | ICD-10-CM | POA: Diagnosis not present

## 2018-03-11 DIAGNOSIS — Z96642 Presence of left artificial hip joint: Secondary | ICD-10-CM | POA: Diagnosis not present

## 2018-03-11 DIAGNOSIS — I4891 Unspecified atrial fibrillation: Secondary | ICD-10-CM | POA: Diagnosis not present

## 2018-03-11 DIAGNOSIS — T84091D Other mechanical complication of internal left hip prosthesis, subsequent encounter: Secondary | ICD-10-CM | POA: Diagnosis not present

## 2018-03-11 DIAGNOSIS — I129 Hypertensive chronic kidney disease with stage 1 through stage 4 chronic kidney disease, or unspecified chronic kidney disease: Secondary | ICD-10-CM | POA: Diagnosis not present

## 2018-03-11 DIAGNOSIS — Z5181 Encounter for therapeutic drug level monitoring: Secondary | ICD-10-CM | POA: Diagnosis not present

## 2018-03-11 DIAGNOSIS — T84031D Mechanical loosening of internal left hip prosthetic joint, subsequent encounter: Secondary | ICD-10-CM | POA: Diagnosis not present

## 2018-03-12 ENCOUNTER — Encounter: Payer: Medicare Other | Admitting: Internal Medicine

## 2018-03-13 ENCOUNTER — Ambulatory Visit (INDEPENDENT_AMBULATORY_CARE_PROVIDER_SITE_OTHER): Payer: Medicare Other

## 2018-03-13 DIAGNOSIS — Z5181 Encounter for therapeutic drug level monitoring: Secondary | ICD-10-CM | POA: Diagnosis not present

## 2018-03-13 DIAGNOSIS — I4891 Unspecified atrial fibrillation: Secondary | ICD-10-CM | POA: Diagnosis not present

## 2018-03-13 DIAGNOSIS — Z96642 Presence of left artificial hip joint: Secondary | ICD-10-CM | POA: Diagnosis not present

## 2018-03-13 DIAGNOSIS — I129 Hypertensive chronic kidney disease with stage 1 through stage 4 chronic kidney disease, or unspecified chronic kidney disease: Secondary | ICD-10-CM | POA: Diagnosis not present

## 2018-03-13 DIAGNOSIS — T84091D Other mechanical complication of internal left hip prosthesis, subsequent encounter: Secondary | ICD-10-CM | POA: Diagnosis not present

## 2018-03-13 DIAGNOSIS — T84031D Mechanical loosening of internal left hip prosthetic joint, subsequent encounter: Secondary | ICD-10-CM | POA: Diagnosis not present

## 2018-03-13 LAB — POCT INR: INR: 2.1 (ref 2.0–3.0)

## 2018-03-13 NOTE — Patient Instructions (Signed)
Pt s/p hip revision surgery - Gerald Stabs w/ [facility] calling w/ results [phone]. Advised him to have pt continue dosage of 2.5 mg every day. Recheck in 1 week.

## 2018-03-15 DIAGNOSIS — Z96642 Presence of left artificial hip joint: Secondary | ICD-10-CM | POA: Diagnosis not present

## 2018-03-15 DIAGNOSIS — Z5181 Encounter for therapeutic drug level monitoring: Secondary | ICD-10-CM | POA: Diagnosis not present

## 2018-03-15 DIAGNOSIS — I129 Hypertensive chronic kidney disease with stage 1 through stage 4 chronic kidney disease, or unspecified chronic kidney disease: Secondary | ICD-10-CM | POA: Diagnosis not present

## 2018-03-15 DIAGNOSIS — T84091D Other mechanical complication of internal left hip prosthesis, subsequent encounter: Secondary | ICD-10-CM | POA: Diagnosis not present

## 2018-03-15 DIAGNOSIS — T84031D Mechanical loosening of internal left hip prosthetic joint, subsequent encounter: Secondary | ICD-10-CM | POA: Diagnosis not present

## 2018-03-15 DIAGNOSIS — I4891 Unspecified atrial fibrillation: Secondary | ICD-10-CM | POA: Diagnosis not present

## 2018-03-17 DIAGNOSIS — T84031D Mechanical loosening of internal left hip prosthetic joint, subsequent encounter: Secondary | ICD-10-CM | POA: Diagnosis not present

## 2018-03-20 ENCOUNTER — Ambulatory Visit (INDEPENDENT_AMBULATORY_CARE_PROVIDER_SITE_OTHER): Payer: Medicare Other

## 2018-03-20 ENCOUNTER — Telehealth: Payer: Self-pay | Admitting: Internal Medicine

## 2018-03-20 DIAGNOSIS — I4891 Unspecified atrial fibrillation: Secondary | ICD-10-CM | POA: Diagnosis not present

## 2018-03-20 DIAGNOSIS — Z5181 Encounter for therapeutic drug level monitoring: Secondary | ICD-10-CM

## 2018-03-20 DIAGNOSIS — I129 Hypertensive chronic kidney disease with stage 1 through stage 4 chronic kidney disease, or unspecified chronic kidney disease: Secondary | ICD-10-CM | POA: Diagnosis not present

## 2018-03-20 DIAGNOSIS — T84091D Other mechanical complication of internal left hip prosthesis, subsequent encounter: Secondary | ICD-10-CM | POA: Diagnosis not present

## 2018-03-20 DIAGNOSIS — Z96642 Presence of left artificial hip joint: Secondary | ICD-10-CM | POA: Diagnosis not present

## 2018-03-20 DIAGNOSIS — T84031D Mechanical loosening of internal left hip prosthetic joint, subsequent encounter: Secondary | ICD-10-CM | POA: Diagnosis not present

## 2018-03-20 LAB — POCT INR: INR: 1.8 — AB (ref 2.0–3.0)

## 2018-03-20 NOTE — Telephone Encounter (Signed)
Please see anti-coag note for today. 

## 2018-03-20 NOTE — Patient Instructions (Signed)
Pt s/p hip revision surgery - Jatana w/ [facility] calling w/ results [phone]. Advised her to have pt START NEW DOSAGE of 2.5 mg every day except 5 mg on Wednesdays. Spoke w/ pt and advised her of recommendation.  Recheck in 1 week.

## 2018-03-20 NOTE — Telephone Encounter (Signed)
Physical therapist is calling with INR results . 1.8

## 2018-03-22 DIAGNOSIS — I4891 Unspecified atrial fibrillation: Secondary | ICD-10-CM | POA: Diagnosis not present

## 2018-03-22 DIAGNOSIS — T84031D Mechanical loosening of internal left hip prosthetic joint, subsequent encounter: Secondary | ICD-10-CM | POA: Diagnosis not present

## 2018-03-22 DIAGNOSIS — I129 Hypertensive chronic kidney disease with stage 1 through stage 4 chronic kidney disease, or unspecified chronic kidney disease: Secondary | ICD-10-CM | POA: Diagnosis not present

## 2018-03-22 DIAGNOSIS — Z96642 Presence of left artificial hip joint: Secondary | ICD-10-CM | POA: Diagnosis not present

## 2018-03-22 DIAGNOSIS — T84091D Other mechanical complication of internal left hip prosthesis, subsequent encounter: Secondary | ICD-10-CM | POA: Diagnosis not present

## 2018-03-22 DIAGNOSIS — Z5181 Encounter for therapeutic drug level monitoring: Secondary | ICD-10-CM | POA: Diagnosis not present

## 2018-03-26 ENCOUNTER — Encounter: Payer: Medicare Other | Admitting: Internal Medicine

## 2018-03-26 DIAGNOSIS — I129 Hypertensive chronic kidney disease with stage 1 through stage 4 chronic kidney disease, or unspecified chronic kidney disease: Secondary | ICD-10-CM | POA: Diagnosis not present

## 2018-03-26 DIAGNOSIS — I4891 Unspecified atrial fibrillation: Secondary | ICD-10-CM | POA: Diagnosis not present

## 2018-03-26 DIAGNOSIS — T84031D Mechanical loosening of internal left hip prosthetic joint, subsequent encounter: Secondary | ICD-10-CM | POA: Diagnosis not present

## 2018-03-26 DIAGNOSIS — Z5181 Encounter for therapeutic drug level monitoring: Secondary | ICD-10-CM | POA: Diagnosis not present

## 2018-03-26 DIAGNOSIS — Z96642 Presence of left artificial hip joint: Secondary | ICD-10-CM | POA: Diagnosis not present

## 2018-03-26 DIAGNOSIS — T84091D Other mechanical complication of internal left hip prosthesis, subsequent encounter: Secondary | ICD-10-CM | POA: Diagnosis not present

## 2018-03-28 DIAGNOSIS — Z5181 Encounter for therapeutic drug level monitoring: Secondary | ICD-10-CM | POA: Diagnosis not present

## 2018-03-28 DIAGNOSIS — Z96642 Presence of left artificial hip joint: Secondary | ICD-10-CM | POA: Diagnosis not present

## 2018-03-28 DIAGNOSIS — T84031D Mechanical loosening of internal left hip prosthetic joint, subsequent encounter: Secondary | ICD-10-CM | POA: Diagnosis not present

## 2018-03-28 DIAGNOSIS — I4891 Unspecified atrial fibrillation: Secondary | ICD-10-CM | POA: Diagnosis not present

## 2018-03-28 DIAGNOSIS — I129 Hypertensive chronic kidney disease with stage 1 through stage 4 chronic kidney disease, or unspecified chronic kidney disease: Secondary | ICD-10-CM | POA: Diagnosis not present

## 2018-03-28 DIAGNOSIS — T84091D Other mechanical complication of internal left hip prosthesis, subsequent encounter: Secondary | ICD-10-CM | POA: Diagnosis not present

## 2018-04-01 DIAGNOSIS — Z96642 Presence of left artificial hip joint: Secondary | ICD-10-CM | POA: Diagnosis not present

## 2018-04-01 DIAGNOSIS — Z5181 Encounter for therapeutic drug level monitoring: Secondary | ICD-10-CM | POA: Diagnosis not present

## 2018-04-01 DIAGNOSIS — I129 Hypertensive chronic kidney disease with stage 1 through stage 4 chronic kidney disease, or unspecified chronic kidney disease: Secondary | ICD-10-CM | POA: Diagnosis not present

## 2018-04-01 DIAGNOSIS — I4891 Unspecified atrial fibrillation: Secondary | ICD-10-CM | POA: Diagnosis not present

## 2018-04-01 DIAGNOSIS — T84031D Mechanical loosening of internal left hip prosthetic joint, subsequent encounter: Secondary | ICD-10-CM | POA: Diagnosis not present

## 2018-04-01 DIAGNOSIS — T84091D Other mechanical complication of internal left hip prosthesis, subsequent encounter: Secondary | ICD-10-CM | POA: Diagnosis not present

## 2018-04-03 ENCOUNTER — Ambulatory Visit (INDEPENDENT_AMBULATORY_CARE_PROVIDER_SITE_OTHER): Payer: Medicare Other

## 2018-04-03 DIAGNOSIS — Z79899 Other long term (current) drug therapy: Secondary | ICD-10-CM | POA: Diagnosis not present

## 2018-04-03 DIAGNOSIS — Z5181 Encounter for therapeutic drug level monitoring: Secondary | ICD-10-CM

## 2018-04-03 DIAGNOSIS — T84091D Other mechanical complication of internal left hip prosthesis, subsequent encounter: Secondary | ICD-10-CM | POA: Diagnosis not present

## 2018-04-03 DIAGNOSIS — Z950 Presence of cardiac pacemaker: Secondary | ICD-10-CM | POA: Diagnosis not present

## 2018-04-03 DIAGNOSIS — E669 Obesity, unspecified: Secondary | ICD-10-CM | POA: Diagnosis not present

## 2018-04-03 DIAGNOSIS — Z96642 Presence of left artificial hip joint: Secondary | ICD-10-CM | POA: Diagnosis not present

## 2018-04-03 DIAGNOSIS — D631 Anemia in chronic kidney disease: Secondary | ICD-10-CM | POA: Diagnosis not present

## 2018-04-03 DIAGNOSIS — Z79891 Long term (current) use of opiate analgesic: Secondary | ICD-10-CM | POA: Diagnosis not present

## 2018-04-03 DIAGNOSIS — T84031D Mechanical loosening of internal left hip prosthetic joint, subsequent encounter: Secondary | ICD-10-CM | POA: Diagnosis not present

## 2018-04-03 DIAGNOSIS — I4891 Unspecified atrial fibrillation: Secondary | ICD-10-CM

## 2018-04-03 DIAGNOSIS — N183 Chronic kidney disease, stage 3 (moderate): Secondary | ICD-10-CM | POA: Diagnosis not present

## 2018-04-03 DIAGNOSIS — Z7901 Long term (current) use of anticoagulants: Secondary | ICD-10-CM | POA: Diagnosis not present

## 2018-04-03 DIAGNOSIS — I129 Hypertensive chronic kidney disease with stage 1 through stage 4 chronic kidney disease, or unspecified chronic kidney disease: Secondary | ICD-10-CM | POA: Diagnosis not present

## 2018-04-03 LAB — POCT INR: INR: 2.3 (ref 2.0–3.0)

## 2018-04-03 NOTE — Patient Instructions (Signed)
Pt s/p hip revision surgery Gerald Stabs w/ PT calling w/ results [phone]. Advised him to have pt continue dosage of 2.5 mg every day except 5 mg on Wednesdays. Recheck in 1 week.

## 2018-04-04 ENCOUNTER — Other Ambulatory Visit: Payer: Self-pay | Admitting: Internal Medicine

## 2018-04-10 ENCOUNTER — Ambulatory Visit (INDEPENDENT_AMBULATORY_CARE_PROVIDER_SITE_OTHER): Payer: Medicare Other

## 2018-04-10 DIAGNOSIS — T84091D Other mechanical complication of internal left hip prosthesis, subsequent encounter: Secondary | ICD-10-CM | POA: Diagnosis not present

## 2018-04-10 DIAGNOSIS — I4891 Unspecified atrial fibrillation: Secondary | ICD-10-CM

## 2018-04-10 DIAGNOSIS — T84031D Mechanical loosening of internal left hip prosthetic joint, subsequent encounter: Secondary | ICD-10-CM | POA: Diagnosis not present

## 2018-04-10 DIAGNOSIS — I129 Hypertensive chronic kidney disease with stage 1 through stage 4 chronic kidney disease, or unspecified chronic kidney disease: Secondary | ICD-10-CM | POA: Diagnosis not present

## 2018-04-10 DIAGNOSIS — Z96642 Presence of left artificial hip joint: Secondary | ICD-10-CM | POA: Diagnosis not present

## 2018-04-10 DIAGNOSIS — Z5181 Encounter for therapeutic drug level monitoring: Secondary | ICD-10-CM

## 2018-04-10 LAB — POCT INR: INR: 2.3 (ref 2.0–3.0)

## 2018-04-10 NOTE — Patient Instructions (Signed)
Pt s/p hip revision surgery [name] w/ PT calling w/ results [phone]. Advised him to have pt continue dosage of 2.5 mg every day except 5 mg on Wednesdays. Today is [name]'s last visit w/ pt. Advised him that pt needs recheck in 3-4 weeks, he will have her call to set up appt at her convenience.

## 2018-04-10 NOTE — Progress Notes (Signed)
Gerald Stabs w/ PT calls w/ pt's INR readings.   Today is his last visit w/ pt.

## 2018-04-11 DIAGNOSIS — M25552 Pain in left hip: Secondary | ICD-10-CM | POA: Diagnosis not present

## 2018-04-12 DIAGNOSIS — Z5181 Encounter for therapeutic drug level monitoring: Secondary | ICD-10-CM | POA: Diagnosis not present

## 2018-04-12 DIAGNOSIS — T84031D Mechanical loosening of internal left hip prosthetic joint, subsequent encounter: Secondary | ICD-10-CM | POA: Diagnosis not present

## 2018-04-12 DIAGNOSIS — Z96642 Presence of left artificial hip joint: Secondary | ICD-10-CM | POA: Diagnosis not present

## 2018-04-12 DIAGNOSIS — I4891 Unspecified atrial fibrillation: Secondary | ICD-10-CM | POA: Diagnosis not present

## 2018-04-12 DIAGNOSIS — T84091D Other mechanical complication of internal left hip prosthesis, subsequent encounter: Secondary | ICD-10-CM | POA: Diagnosis not present

## 2018-04-12 DIAGNOSIS — I129 Hypertensive chronic kidney disease with stage 1 through stage 4 chronic kidney disease, or unspecified chronic kidney disease: Secondary | ICD-10-CM | POA: Diagnosis not present

## 2018-04-24 ENCOUNTER — Telehealth: Payer: Self-pay | Admitting: Cardiovascular Disease

## 2018-04-24 DIAGNOSIS — I4891 Unspecified atrial fibrillation: Secondary | ICD-10-CM

## 2018-04-24 DIAGNOSIS — Z5181 Encounter for therapeutic drug level monitoring: Secondary | ICD-10-CM

## 2018-04-24 NOTE — Telephone Encounter (Signed)
New Message  Pt verbalized she is supposed to have her INR checked on 05/01/2018 and would like to speak to [place] to schedule.

## 2018-04-24 NOTE — Telephone Encounter (Signed)
Spoke w/ pt.  Scheduled INR check 05/06/18 @ 2:00.

## 2018-04-29 NOTE — Progress Notes (Signed)
Patient Care Team: Briles, Jodelle Green, NP as PCP - General (Family Medicine) Fay Records, MD as PCP - Cardiology (Cardiology)   HPI  [name] is a 78 y.o. female Seen in follow-up for pacemaker implanted 2010 by Dr. Elliot Cousin for bradycardia in the context of permanent atrial fibrillation.  Date Cr Hgb  8/19 1.1 12.1  1/20  1.05 11.9   She is accompanied by her husband. Her rash has resolved after using the prescribed topical steroid cream. She lives in [place].   The patient denies chest pain, shortness of breath, nocturnal dyspnea, orthopnea or peripheral edema.  There have been no palpitations, lightheadedness or syncope.   Records and Results Reviewed   Past Medical History:  Diagnosis Date   Anxiety    Aortic insufficiency    Trivial   Arthritis    Asthma    Bronchitis    Bursitis, hip    Claustrophobia    Claustrophobia    Dysrhythmia    a fib   GERD (gastroesophageal reflux disease)    history of   Gout    Hyperlipidemia    Hypertension    Hypothyroidism    Mitral regurgitation    Obesity    Panic attacks    Paroxysmal atrial fibrillation (HCC)    PTSD (post-traumatic stress disorder)    Rhinitis    Sick sinus syndrome (HCC)    Sleep apnea    was ordered CPAP, but never fitted for one years ago, Dr has retired    Past Surgical History:  Procedure Laterality Date   [place] N/A 08/03/2015   Procedure: [place];  Surgeon: Alden Hipp, MD;  Location: [facility];  Service: Gynecology;  Laterality: N/A;  Patient has an ICD    INSERT / REPLACE / REMOVE PACEMAKER     PACEMAKER INSERTION     Status post insertion   ROTATOR CUFF REPAIR     left   Status post child birth x1     TONSILLECTOMY     TOTAL HIP ARTHROPLASTY Left 12/22/2015   Procedure: TOTAL HIP ARTHROPLASTY;  Surgeon: Dereck Leep, MD;  Location: [facility];  Service: Orthopedics;  Laterality: Left;   TOTAL HIP REVISION Left 03/01/2018   Procedure: TOTAL HIP REVISION;  Surgeon: Dereck Leep, MD;  Location: [facility];  Service: Orthopedics;  Laterality: Left;    Current Meds  Medication Sig   allopurinol (ZYLOPRIM) 100 MG tablet Take 1 tablet by mouth daily.    colchicine 0.6 MG tablet Take 0.6 mg by mouth daily as needed (GOUT).    diazepam (VALIUM) 10 MG tablet Take 10 mg by mouth 2 (two) times daily as needed for anxiety.   liothyronine (CYTOMEL) 5 MCG tablet Take 15 mcg by mouth daily.    metoprolol tartrate (LOPRESSOR) 50 MG tablet Take 75 mg by mouth 2 (two) times daily.   [START ON 05/01/2018] oxyCODONE (OXY IR/ROXICODONE) 5 MG immediate release tablet Take 1 tablet (5 mg total) by mouth 5 (five) times daily as needed for severe pain.   oxyCODONE (OXY IR/ROXICODONE) 5 MG immediate release tablet Take 1 tablet (5 mg total) by mouth 5 (five) times daily as needed for severe pain.   spironolactone (ALDACTONE) 100 MG tablet Take 100 mg by mouth daily.   thyroid (ARMOUR) 30 MG tablet Take 30 mg by mouth daily before breakfast.   warfarin (COUMADIN) 2.5 MG  tablet Take 1 tablet (2.5 mg total) by mouth daily.    Allergies  Allergen Reactions   Contrast Media [Iodinated Diagnostic Agents] Rash    Contrast dye causes swelling    Review of Systems negative except from HPI and PMH  Physical Exam BP 138/74 (BP Location: Left Arm, Patient Position: Sitting, Cuff Size: Normal)    Pulse 85    Ht 5\' 1"  (1.549 m)    Wt 191 lb (86.6 kg)    BMI 36.09 kg/m  Well developed and Morbidly obese in no acute distress HENT normal Neck supple with JVP-flat Clear Device pocket well healed; without hematoma or erythema.  There is no tethering  Regular rate and rhythm, no gallops, no murmurs Abd-soft with active BS No Clubbing, cyanosis, edema Skin-warm and dry A & Oriented  Grossly normal sensory and motor function  ECG Afib 2  85 -/09/35    Assessment and  Plan  Atrial fibrillation permanent  Bradycardia symptomatic  Pacemaker St Jude Device interrogated. 1.25 years remaining on the battery. Discussed with patient  Rash    Rash has resolved with topical steroids  On Anticoagulation;  No bleeding issues  F/u in 6 months  We spent more than 50% of our >15  min visit in face to face counseling regarding the above   Current medicines are reviewed at length with the patient today .  The patient does not  have concerns regarding medicines.   I, Margit Banda am acting as a Education administrator for Virl Axe, M.D. I have reviewed the above documentation for accuracy and completeness, and I agree with the above.    Signed, Virl Axe, MD 04/30/18 [place], [place]

## 2018-04-30 ENCOUNTER — Encounter: Payer: Self-pay | Admitting: Internal Medicine

## 2018-04-30 ENCOUNTER — Ambulatory Visit (INDEPENDENT_AMBULATORY_CARE_PROVIDER_SITE_OTHER): Payer: Medicare Other | Admitting: Internal Medicine

## 2018-04-30 VITALS — BP 138/74 | HR 85 | Ht 61.0 in | Wt 191.0 lb

## 2018-04-30 DIAGNOSIS — I482 Chronic atrial fibrillation, unspecified: Secondary | ICD-10-CM

## 2018-04-30 DIAGNOSIS — Z950 Presence of cardiac pacemaker: Secondary | ICD-10-CM

## 2018-04-30 DIAGNOSIS — R001 Bradycardia, unspecified: Secondary | ICD-10-CM | POA: Diagnosis not present

## 2018-04-30 NOTE — Patient Instructions (Addendum)
Medication Instructions:  - Your physician recommends that you continue on your current medications as directed. Please refer to the Current Medication list given to you today.  If you need a refill on your cardiac medications before your next appointment, please call your pharmacy.   Lab work: - none ordered  If you have labs (blood work) drawn today and your tests are completely normal, you will receive your results only by: Marland Kitchen MyChart Message (if you have MyChart) OR . A paper copy in the mail If you have any lab test that is abnormal or we need to change your treatment, we will call you to review the results.  Testing/Procedures: - none ordered  Follow-Up: At [facility], you and your health needs are our priority.  As part of our continuing mission to provide you with exceptional heart care, we have created designated Provider Care Teams.  These Care Teams include your primary Cardiologist (physician) and Advanced Practice Providers (APPs -  Physician Assistants and Nurse Practitioners) who all work together to provide you with the care you need, when you need it. You will need a follow up appointment in 4 months with Dr. Caryl Comes.  Please call our office 2 months in advance to schedule this appointment. (Please call the office in early May to schedule an appointment).   Any Other Special Instructions Will Be Listed Below (If Applicable). - N/A

## 2018-05-02 LAB — CUP PACEART INCLINIC DEVICE CHECK
Brady Statistic RV Percent Paced: 39 %
Implantable Lead Implant Date: 20090317
Implantable Lead Implant Date: 20090317
Implantable Lead Location: 753859
Implantable Pulse Generator Implant Date: 20090317
Lead Channel Pacing Threshold Amplitude: 0.5 V
Lead Channel Pacing Threshold Pulse Width: 0.4 ms
Lead Channel Sensing Intrinsic Amplitude: 12 mV
Lead Channel Setting Pacing Amplitude: 2.5 V
Lead Channel Setting Pacing Pulse Width: 0.4 ms
MDC IDC LEAD LOCATION: 753860
MDC IDC MSMT LEADCHNL RV IMPEDANCE VALUE: 562 Ohm
MDC IDC SESS DTM: 20200305110712
MDC IDC SET LEADCHNL RV SENSING SENSITIVITY: 2 mV
Pulse Gen Model: 5826
Pulse Gen Serial Number: 2056081

## 2018-05-06 ENCOUNTER — Ambulatory Visit (INDEPENDENT_AMBULATORY_CARE_PROVIDER_SITE_OTHER): Payer: Medicare Other

## 2018-05-06 DIAGNOSIS — Z5181 Encounter for therapeutic drug level monitoring: Secondary | ICD-10-CM

## 2018-05-06 DIAGNOSIS — I4891 Unspecified atrial fibrillation: Secondary | ICD-10-CM

## 2018-05-06 LAB — POCT INR: INR: 2.8 (ref 2.0–3.0)

## 2018-05-06 NOTE — Patient Instructions (Signed)
Please continue on dosage of 2.5 mg every day except 5 mg on Wednesdays.  Recheck in 6 weeks.  

## 2018-05-28 ENCOUNTER — Other Ambulatory Visit: Payer: Self-pay

## 2018-05-28 ENCOUNTER — Ambulatory Visit: Payer: Medicare Other | Attending: Nurse Practitioner | Admitting: Nurse Practitioner

## 2018-05-28 DIAGNOSIS — M48062 Spinal stenosis, lumbar region with neurogenic claudication: Secondary | ICD-10-CM | POA: Diagnosis not present

## 2018-05-28 DIAGNOSIS — M25552 Pain in left hip: Secondary | ICD-10-CM | POA: Diagnosis not present

## 2018-05-28 DIAGNOSIS — M461 Sacroiliitis, not elsewhere classified: Secondary | ICD-10-CM

## 2018-05-28 DIAGNOSIS — M47818 Spondylosis without myelopathy or radiculopathy, sacral and sacrococcygeal region: Secondary | ICD-10-CM

## 2018-05-28 DIAGNOSIS — G894 Chronic pain syndrome: Secondary | ICD-10-CM | POA: Diagnosis not present

## 2018-05-28 DIAGNOSIS — G8929 Other chronic pain: Secondary | ICD-10-CM | POA: Diagnosis not present

## 2018-05-28 NOTE — Patient Instructions (Signed)
____________________________________________________________________________________________  Medication Rules  Purpose: To inform patients, and their family members, of our rules and regulations.  Applies to: All patients receiving prescriptions (written or electronic).  Pharmacy of record: Pharmacy where electronic prescriptions will be sent. If written prescriptions are taken to a different pharmacy, please inform the nursing staff. The pharmacy listed in the electronic medical record should be the one where you would like electronic prescriptions to be sent.  Electronic prescriptions: In compliance with the [place] Strengthen Opioid Misuse Prevention (STOP) Act of 2017 (Session Law 2017-74/H243), effective February 27, 2018, all controlled substances must be electronically prescribed. Calling prescriptions to the pharmacy will cease to exist.  Prescription refills: Only during scheduled appointments. Applies to all prescriptions.  NOTE: The following applies primarily to controlled substances (Opioid* Pain Medications).   Patient's responsibilities: 1. Pain Pills: Bring all pain pills to every appointment (except for procedure appointments). 2. Pill Bottles: Bring pills in original pharmacy bottle. Always bring the newest bottle. Bring bottle, even if empty. 3. Medication refills: You are responsible for knowing and keeping track of what medications you take and those you need refilled. The day before your appointment: write a list of all prescriptions that need to be refilled. The day of the appointment: give the list to the admitting nurse. Prescriptions will be written only during appointments. No prescriptions will be written on procedure days. If you forget a medication: it will not be "Called in", "Faxed", or "electronically sent". You will need to get another appointment to get these prescribed. No early refills. Do not call asking to have your prescription filled  early. 4. Prescription Accuracy: You are responsible for carefully inspecting your prescriptions before leaving our office. Have the discharge nurse carefully go over each prescription with you, before taking them home. Make sure that your name is accurately spelled, that your address is correct. Check the name and dose of your medication to make sure it is accurate. Check the number of pills, and the written instructions to make sure they are clear and accurate. Make sure that you are given enough medication to last until your next medication refill appointment. 5. Taking Medication: Take medication as prescribed. When it comes to controlled substances, taking less pills or less frequently than prescribed is permitted and encouraged. Never take more pills than instructed. Never take medication more frequently than prescribed.  6. Inform other Doctors: Always inform, all of your healthcare providers, of all the medications you take. 7. Pain Medication from other Providers: You are not allowed to accept any additional pain medication from any other Doctor or Healthcare provider. There are two exceptions to this rule. (see below) In the event that you require additional pain medication, you are responsible for notifying us, as stated below. 8. Medication Agreement: You are responsible for carefully reading and following our Medication Agreement. This must be signed before receiving any prescriptions from our practice. Safely store a copy of your signed Agreement. Violations to the Agreement will result in no further prescriptions. (Additional copies of our Medication Agreement are available upon request.) 9. Laws, Rules, & Regulations: All patients are expected to follow all Federal and State Laws, Statutes, Rules, & Regulations. Ignorance of the Laws does not constitute a valid excuse. The use of any illegal substances is prohibited. 10. Adopted CDC guidelines & recommendations: Target dosing levels will be  at or below 60 MME/day. Use of benzodiazepines** is not recommended.  Exceptions: There are only two exceptions to the rule of not   receiving pain medications from other Healthcare Providers. 1. Exception #1 (Emergencies): In the event of an emergency (i.e.: accident requiring emergency care), you are allowed to receive additional pain medication. However, you are responsible for: As soon as you are able, call our office [phone], at any time of the day or night, and leave a message stating your name, the date and nature of the emergency, and the name and dose of the medication prescribed. In the event that your call is answered by a member of our staff, make sure to document and save the date, time, and the name of the person that took your information.  2. Exception #2 (Planned Surgery): In the event that you are scheduled by another doctor or dentist to have any type of surgery or procedure, you are allowed (for a period no longer than 30 days), to receive additional pain medication, for the acute post-op pain. However, in this case, you are responsible for picking up a copy of our "Post-op Pain Management for Surgeons" handout, and giving it to your surgeon or dentist. This document is available at our office, and does not require an appointment to obtain it. Simply go to our office during business hours (Monday-Thursday from 8:00 AM to 4:00 PM) (Friday 8:00 AM to 12:00 Noon) or if you have a scheduled appointment with us, prior to your surgery, and ask for it by name. In addition, you will need to provide us with your name, name of your surgeon, type of surgery, and date of procedure or surgery.  *Opioid medications include: morphine, codeine, oxycodone, oxymorphone, hydrocodone, hydromorphone, meperidine, tramadol, tapentadol, buprenorphine, fentanyl, methadone. **Benzodiazepine medications include: diazepam (Valium), alprazolam (Xanax), clonazepam (Klonopine), lorazepam (Ativan), clorazepate  (Tranxene), chlordiazepoxide (Librium), estazolam (Prosom), oxazepam (Serax), temazepam (Restoril), triazolam (Halcion) (Last updated: 04/26/2017) ____________________________________________________________________________________________    

## 2018-05-28 NOTE — Progress Notes (Signed)
Virtual Visit via Telephone Note  I connected with [name] on 05/28/18 at 10:30 AM EDT by telephone and verified that I am speaking with the correct person using two identifiers.   I discussed the limitations, risks, security and privacy concerns of performing an evaluation and management service by telephone and the availability of in person appointments. I also discussed with the patient that there may be a patient responsible charge related to this service. The patient expressed understanding and agreed to proceed.   History of Present Illness: Reason for Visit: [name] is a 78 y.o. year old, female patient, whose chief complaint of left hip pain  Last Appointment: Her last appointment at our practice was on 02/28/2018. I last saw her on 02/28/2018.  Pain Assessment: Today, [name] describes the severity a 2/10. She is SP left hip revision of hardware after history of a left total hip replacement.She admits that the surgery went well. She feels like this did help her pain some but she continues to have pain. She denies any current pain radiating into her legs. She admits that she has tried to see if she can decrease her use of the Oxycodone. However she has failed on several occasions. She admits that with out the medication her pain goes up to a 5/10. This cause her cry in pain. She is not going to continue to try this.She is going to take her medication as directed. She denies any current weakness or any recent falls or injuries. She has moved in with her daughter along with her husband. She admits that her family takes good care of her.  She does have some constipation but she uses stool softners and prune juice. This is effective.   Observations/Objective: Mental status: Alert, oriented x 3 (person, place, & time)       Respiratory: No evidence of acute respiratory distress Vitals: There were no vitals taken for this visit. BMI: Estimated body mass index is 36.09 kg/m as  calculated from the following:   Height as of 04/30/18: 5\' 1"  (1.549 m).   Weight as of 04/30/18: 191 lb (86.6 kg). Ideal: Patient weight not recorded  Assessment and Plan: Assessment   Status Diagnosis  Controlled Controlled Controlled 1. Chronic hip pain (Location of Primary Source of Pain) (Left)   2. Osteoarthritis of sacroiliac joint (with vacuum phenomena in) (Bilateral)   3. Spinal stenosis of lumbar region with neurogenic claudication   4. Chronic pain syndrome      Updated Problems: No problems updated.   Follow Up Instructions: Planned, scheduled, and/or pending: None at this time.   Considering: None at this time.   Palliative PRN treatment(s): None at this time.     I discussed the assessment and treatment plan with the patient. The patient was provided an opportunity to ask questions and all were answered. The patient agreed with the plan and demonstrated an understanding of the instructions.   The patient was advised to call back or seek an in-person evaluation if the symptoms worsen or if the condition fails to improve as anticipated.  I provided 13 minutes of non-face-to-face time during this encounter.   Dionisio David, NP

## 2018-05-29 MED ORDER — OXYCODONE HCL 5 MG PO TABS: 5.0000 mg | ORAL_TABLET | Freq: Every day | ORAL | 0 refills | Status: DC

## 2018-05-29 MED ORDER — OXYCODONE HCL 5 MG PO TABS: 5.0000 mg | ORAL_TABLET | Freq: Every day | ORAL | 0 refills | Status: DC | PRN

## 2018-06-14 ENCOUNTER — Telehealth: Payer: Self-pay

## 2018-06-14 NOTE — Telephone Encounter (Signed)

## 2018-06-17 ENCOUNTER — Other Ambulatory Visit: Payer: Self-pay

## 2018-06-17 ENCOUNTER — Ambulatory Visit (INDEPENDENT_AMBULATORY_CARE_PROVIDER_SITE_OTHER): Payer: Medicare Other

## 2018-06-17 DIAGNOSIS — I4891 Unspecified atrial fibrillation: Secondary | ICD-10-CM | POA: Diagnosis not present

## 2018-06-17 DIAGNOSIS — Z5181 Encounter for therapeutic drug level monitoring: Secondary | ICD-10-CM | POA: Diagnosis not present

## 2018-06-17 LAB — POCT INR: INR: 2.3 (ref 2.0–3.0)

## 2018-06-17 NOTE — Patient Instructions (Signed)
Please continue on dosage of 2.5 mg every day except 5 mg on Wednesdays.  Recheck in 8 weeks.

## 2018-07-24 DIAGNOSIS — B351 Tinea unguium: Secondary | ICD-10-CM | POA: Diagnosis not present

## 2018-07-24 DIAGNOSIS — M79674 Pain in right toe(s): Secondary | ICD-10-CM | POA: Diagnosis not present

## 2018-07-24 DIAGNOSIS — M79675 Pain in left toe(s): Secondary | ICD-10-CM | POA: Diagnosis not present

## 2018-07-26 ENCOUNTER — Telehealth: Payer: Self-pay

## 2018-07-26 NOTE — Telephone Encounter (Signed)
Virtual Visit Pre-Appointment Phone Call  "[place], I am calling you today to discuss your upcoming appointment. We are currently trying to limit exposure to the virus that causes COVID-19 by seeing patients at home rather than in the office."  1. "What is the BEST phone number to call the day of the visit?" - include this in appointment notes  2. "Do you have or have access to (through a family member/friend) a smartphone with video capability that we can use for your visit?" a. If yes - list this number in appt notes as "cell" (if different from BEST phone #) and list the appointment type as a VIDEO visit in appointment notes b. If no - list the appointment type as a PHONE visit in appointment notes  3. Confirm consent - "In the setting of the current Covid19 crisis, you are scheduled for a video visit with your provider on August 06, 2018 at 2:40PM.  Just as we do with many in-office visits, in order for you to participate in this visit, we must obtain consent.  If you'd like, I can send this to your mychart (if signed up) or email for you to review.  Otherwise, I can obtain your verbal consent now.  All virtual visits are billed to your insurance company just like a normal visit would be.  By agreeing to a virtual visit, we'd like you to understand that the technology does not allow for your provider to perform an examination, and thus may limit your provider's ability to fully assess your condition. If your provider identifies any concerns that need to be evaluated in person, we will make arrangements to do so.  Finally, though the technology is pretty good, we cannot assure that it will always work on either your or our end, and in the setting of a video visit, we may have to convert it to a phone-only visit.  In either situation, we cannot ensure that we have a secure connection.  Are you willing to proceed?" STAFF: Did the patient verbally acknowledge consent to telehealth visit? Document YES/NO  here: YES  4. Advise patient to be prepared - "Two hours prior to your appointment, go ahead and check your blood pressure, pulse, oxygen saturation, and your weight (if you have the equipment to check those) and write them all down. When your visit starts, your provider will ask you for this information. If you have an Apple Watch or Kardia device, please plan to have heart rate information ready on the day of your appointment. Please have a pen and paper handy nearby the day of the visit as well."  5. Give patient instructions for MyChart download to smartphone OR Doximity/Doxy.me as below if video visit (depending on what platform provider is using)  6. Inform patient they will receive a phone call 15 minutes prior to their appointment time (may be from unknown caller ID) so they should be prepared to answer    [place] has been deemed a candidate for a follow-up tele-health visit to limit community exposure during the Covid-19 pandemic. I spoke with the patient via phone to ensure availability of phone/video source, confirm preferred email & phone number, and discuss instructions and expectations.  I reminded Marlean Wooton to be prepared with any vital sign and/or heart rhythm information that could potentially be obtained via home monitoring, at the time of her visit. I reminded Jahlia Lechuga to expect a phone call prior to her visit.  [name] 07/26/2018 2:59 PM   INSTRUCTIONS FOR DOWNLOADING THE MYCHART APP TO SMARTPHONE  - The patient must first make sure to have activated MyChart and know their login information - If Apple, go to [employer] and type in MyChart in the search bar and download the app. If Android, ask patient to go to [employer] and type in [place] in the search bar and download the app. The app is free but as with any other app downloads, their phone may require them to verify saved payment information or Apple/Android  password.  - The patient will need to then log into the app with their MyChart username and password, and select [facility] as their healthcare provider to link the account. When it is time for your visit, go to the MyChart app, find appointments, and click Begin Video Visit. Be sure to Select Allow for your device to access the Microphone and Camera for your visit. You will then be connected, and your provider will be with you shortly.  **If they have any issues connecting, or need assistance please contact MyChart service desk [phone])**  **If using a computer, in order to ensure the best quality for their visit they will need to use either of the following Internet Browsers: [employer], or Google Chrome**  IF USING DOXIMITY or DOXY.ME - The patient will receive a link just prior to their visit by text.     FULL LENGTH CONSENT FOR TELE-HEALTH VISIT   I hereby voluntarily request, consent and authorize [facility] and its employed or contracted physicians, physician assistants, nurse practitioners or other licensed health care professionals (the Practitioner), to provide me with telemedicine health care services (the "Services") as deemed necessary by the treating Practitioner. I acknowledge and consent to receive the Services by the Practitioner via telemedicine. I understand that the telemedicine visit will involve communicating with the Practitioner through live audiovisual communication technology and the disclosure of certain medical information by electronic transmission. I acknowledge that I have been given the opportunity to request an in-person assessment or other available alternative prior to the telemedicine visit and am voluntarily participating in the telemedicine visit.  I understand that I have the right to withhold or withdraw my consent to the use of telemedicine in the course of my care at any time, without affecting my right to future care or treatment,  and that the Practitioner or I may terminate the telemedicine visit at any time. I understand that I have the right to inspect all information obtained and/or recorded in the course of the telemedicine visit and may receive copies of available information for a reasonable fee.  I understand that some of the potential risks of receiving the Services via telemedicine include:  Marland Kitchen Delay or interruption in medical evaluation due to technological equipment failure or disruption; . Information transmitted may not be sufficient (e.g. poor resolution of images) to allow for appropriate medical decision making by the Practitioner; and/or  . In rare instances, security protocols could fail, causing a breach of personal health information.  Furthermore, I acknowledge that it is my responsibility to provide information about my medical history, conditions and care that is complete and accurate to the best of my ability. I acknowledge that Practitioner's advice, recommendations, and/or decision may be based on factors not within their control, such as incomplete or inaccurate data provided by me or distortions of diagnostic images or specimens that may result from electronic transmissions. I understand that  the practice of medicine is not an exact science and that Practitioner makes no warranties or guarantees regarding treatment outcomes. I acknowledge that I will receive a copy of this consent concurrently upon execution via email to the email address I last provided but may also request a printed copy by calling the office of [facility].    I understand that my insurance will be billed for this visit.   I have read or had this consent read to me. . I understand the contents of this consent, which adequately explains the benefits and risks of the Services being provided via telemedicine.  . I have been provided ample opportunity to ask questions regarding this consent and the Services and have had my questions  answered to my satisfaction. . I give my informed consent for the services to be provided through the use of telemedicine in my medical care  By participating in this telemedicine visit I agree to the above.

## 2018-07-29 ENCOUNTER — Other Ambulatory Visit: Payer: Self-pay

## 2018-07-29 MED ORDER — WARFARIN SODIUM 2.5 MG PO TABS: 2.5000 mg | ORAL_TABLET | Freq: Every day | ORAL | 0 refills | Status: DC

## 2018-07-29 MED ORDER — METOPROLOL TARTRATE 50 MG PO TABS: 75.0000 mg | ORAL_TABLET | Freq: Two times a day (BID) | ORAL | Status: DC

## 2018-07-29 NOTE — Telephone Encounter (Signed)
*  STAT* If patient is at the pharmacy, call can be transferred to refill team.   1. Which medications need to be refilled? (please list name of each medication and dose if known)  Metoprolol, coumadin  2. Which pharmacy/location (including street and city if local pharmacy) is medication to be sent to? [facility] [address]   3. Do they need a 30 day or 90 day supply? [place]

## 2018-07-30 ENCOUNTER — Telehealth: Payer: Self-pay

## 2018-07-30 ENCOUNTER — Telehealth: Payer: Self-pay | Admitting: Cardiovascular Disease

## 2018-07-30 ENCOUNTER — Other Ambulatory Visit: Payer: Self-pay | Admitting: Internal Medicine

## 2018-07-30 MED ORDER — WARFARIN SODIUM 2.5 MG PO TABS: ORAL_TABLET | ORAL | 1 refills | Status: DC

## 2018-07-30 NOTE — Telephone Encounter (Signed)
Patient called stating she tried to refill her warfarin however the pharmacy told her it is too soon. She states she takes warfarin 2.5mg  once daily with the exception of one day per week in which she takes 2 tablets (5mg ). She would like a new Rx sent to [facility] or called back if the directions are different please.

## 2018-07-30 NOTE — Telephone Encounter (Signed)
Please advise if ok to refill. Metoprolol Tartrate 50 mg tablet 1 1/2 tablet bid. Pt requesting 90 day.

## 2018-07-30 NOTE — Telephone Encounter (Signed)
Second encounter opened in error.

## 2018-07-30 NOTE — Addendum Note (Signed)
Addended by: Brynda Peon on: 07/30/2018 09:33 AM   Modules accepted: Orders

## 2018-07-30 NOTE — Telephone Encounter (Signed)
°*  STAT* If patient is at the pharmacy, call can be transferred to refill team.   1. Which medications need to be refilled? (please list name of each medication and dose if known) metoprolol 50 MG 1.5 tablets 2 times daily   2. Which pharmacy/location (including street and city if local pharmacy) is medication to be sent to? [facility] on [address]   3. Do they need a 30 day or 90 day supply? 90 day

## 2018-07-31 ENCOUNTER — Other Ambulatory Visit: Payer: Self-pay

## 2018-08-06 ENCOUNTER — Telehealth: Payer: Medicare Other | Admitting: Cardiovascular Disease

## 2018-08-09 ENCOUNTER — Telehealth: Payer: Self-pay

## 2018-08-09 NOTE — Telephone Encounter (Signed)
Attempted to contact pt to prescreen for COVID19 prior to INR check on Monday, 6/15. Left detailed message advising pt that I am seeing pts back in the office, for her to enter thru the [place] entrance of [facility], to wear a mask, and to arrive about 15 minutes early to ensure that she arrives to her appt on time.  Asked her to call back w/ any questions or concerns.

## 2018-08-12 ENCOUNTER — Ambulatory Visit (INDEPENDENT_AMBULATORY_CARE_PROVIDER_SITE_OTHER): Payer: Medicare Other

## 2018-08-12 ENCOUNTER — Other Ambulatory Visit: Payer: Self-pay

## 2018-08-12 DIAGNOSIS — I4891 Unspecified atrial fibrillation: Secondary | ICD-10-CM | POA: Diagnosis not present

## 2018-08-12 DIAGNOSIS — Z5181 Encounter for therapeutic drug level monitoring: Secondary | ICD-10-CM

## 2018-08-12 LAB — POCT INR: INR: 2 (ref 2.0–3.0)

## 2018-08-12 NOTE — Patient Instructions (Signed)
Please continue on dosage of 2.5 mg every day except 5 mg on Wednesdays.  Recheck in 6 weeks.

## 2018-08-22 DIAGNOSIS — E039 Hypothyroidism, unspecified: Secondary | ICD-10-CM | POA: Diagnosis not present

## 2018-08-22 DIAGNOSIS — N951 Menopausal and female climacteric states: Secondary | ICD-10-CM | POA: Diagnosis not present

## 2018-08-22 DIAGNOSIS — E559 Vitamin D deficiency, unspecified: Secondary | ICD-10-CM | POA: Diagnosis not present

## 2018-08-22 DIAGNOSIS — R799 Abnormal finding of blood chemistry, unspecified: Secondary | ICD-10-CM | POA: Diagnosis not present

## 2018-08-27 ENCOUNTER — Encounter: Payer: Self-pay | Admitting: Pain Medicine

## 2018-08-27 ENCOUNTER — Telehealth: Payer: Self-pay | Admitting: *Deleted

## 2018-08-27 NOTE — Telephone Encounter (Signed)
Voicemail left with patient to please call office to go over medications prior to visit.

## 2018-08-28 ENCOUNTER — Other Ambulatory Visit: Payer: Self-pay

## 2018-08-28 ENCOUNTER — Ambulatory Visit: Payer: Medicare Other | Attending: Nurse Practitioner | Admitting: Pain Medicine

## 2018-08-28 ENCOUNTER — Encounter: Payer: Medicare Other | Admitting: Nurse Practitioner

## 2018-08-28 DIAGNOSIS — G8929 Other chronic pain: Secondary | ICD-10-CM

## 2018-08-28 DIAGNOSIS — M461 Sacroiliitis, not elsewhere classified: Secondary | ICD-10-CM

## 2018-08-28 DIAGNOSIS — M47816 Spondylosis without myelopathy or radiculopathy, lumbar region: Secondary | ICD-10-CM

## 2018-08-28 DIAGNOSIS — M25552 Pain in left hip: Secondary | ICD-10-CM | POA: Diagnosis not present

## 2018-08-28 DIAGNOSIS — G894 Chronic pain syndrome: Secondary | ICD-10-CM

## 2018-08-28 DIAGNOSIS — M47818 Spondylosis without myelopathy or radiculopathy, sacral and sacrococcygeal region: Secondary | ICD-10-CM

## 2018-08-28 DIAGNOSIS — M48061 Spinal stenosis, lumbar region without neurogenic claudication: Secondary | ICD-10-CM

## 2018-08-28 MED ORDER — OXYCODONE HCL 5 MG PO TABS: 5.0000 mg | ORAL_TABLET | Freq: Four times a day (QID) | ORAL | 0 refills | Status: DC | PRN

## 2018-08-28 NOTE — Patient Instructions (Signed)
____________________________________________________________________________________________  Medication Recommendations and Reminders  Applies to: All patients receiving prescriptions (written and/or electronic).  Medication Rules & Regulations: These rules and regulations exist for your safety and that of others. They are not flexible and neither are we. Dismissing or ignoring them will be considered "non-compliance" with medication therapy, resulting in complete and irreversible termination of such therapy. (See document titled "Medication Rules" for more details.) In all conscience, because of safety reasons, we cannot continue providing a therapy where the patient does not follow instructions.  Pharmacy of record:   Definition: This is the pharmacy where your electronic prescriptions will be sent.   We do not endorse any particular pharmacy.  You are not restricted in your choice of pharmacy.  The pharmacy listed in the electronic medical record should be the one where you want electronic prescriptions to be sent.  If you choose to change pharmacy, simply notify our nursing staff of your choice of new pharmacy.  Recommendations:  Keep all of your pain medications in a safe place, under lock and key, even if you live alone.   After you fill your prescription, take 1 week's worth of pills and put them away in a safe place. You should keep a separate, properly labeled bottle for this purpose. The remainder should be kept in the original bottle. Use this as your primary supply, until it runs out. Once it's gone, then you know that you have 1 week's worth of medicine, and it is time to come in for a prescription refill. If you do this correctly, it is unlikely that you will ever run out of medicine.  To make sure that the above recommendation works, it is very important that you make sure your medication refill appointments are scheduled at least 1 week before you run out of medicine. To do  this in an effective manner, make sure that you do not leave the office without scheduling your next medication management appointment. Always ask the nursing staff to show you in your prescription , when your medication will be running out. Then arrange for the receptionist to get you a return appointment, at least 7 days before you run out of medicine. Do not wait until you have 1 or 2 pills left, to come in. This is very poor planning and does not take into consideration that we may need to cancel appointments due to bad weather, sickness, or emergencies affecting our staff.  "Partial Fill": If for any reason your pharmacy does not have enough pills/tablets to completely fill or refill your prescription, do not allow for a "partial fill". You will need a separate prescription to fill the remaining amount, which we will not provide. If the reason for the partial fill is your insurance, you will need to talk to the pharmacist about payment alternatives for the remaining tablets, but again, do not accept a partial fill.  Prescription refills and/or changes in medication(s):   Prescription refills, and/or changes in dose or medication, will be conducted only during scheduled medication management appointments. (Applies to both, written and electronic prescriptions.)  No refills on procedure days. No medication will be changed or started on procedure days. No changes, adjustments, and/or refills will be conducted on a procedure day. Doing so will interfere with the diagnostic portion of the procedure.  No phone refills. No medications will be "called into the pharmacy".  No Fax refills.  No weekend refills.  No Holliday refills.  No after hours refills.  Remember:  Business   hours are:  Monday to Thursday 8:00 AM to 4:00 PM Provider's Schedule: Crystal King, NP - Appointments are:  Medication management: Monday to Thursday 8:00 AM to 4:00 PM Francies Inch, MD - Appointments are:   Medication management: Monday and Wednesday 8:00 AM to 4:00 PM Procedure day: Tuesday and Thursday 7:30 AM to 4:00 PM Bilal Lateef, MD - Appointments are:  Medication management: Tuesday and Thursday 8:00 AM to 4:00 PM Procedure day: Monday and Wednesday 7:30 AM to 4:00 PM (Last update: 04/26/2017) ____________________________________________________________________________________________    

## 2018-08-28 NOTE — Progress Notes (Signed)
Pain Management Virtual Encounter Note - Virtual Visit via Telephone Telehealth (real-time audio visits between healthcare provider and patient).   Patient's Phone No. & Preferred Pharmacy:  [phone] (home); [phone] (mobile); (Preferred) [phone] [email or web address]  [id number] [address], [place] [address] [place] [address] Phone: [phone] Fax: [phone]    Pre-screening note:  Our staff contacted Ms. Zietlow and offered her an "in person", "face-to-face" appointment versus a telephone encounter. She indicated preferring the telephone encounter, at this time.   Reason for Virtual Visit: COVID-19*  Social distancing based on CDC and AMA recommendations.   I contacted Jazzman Cygan on 08/28/2018 via telephone.      I clearly identified myself as Gaspar Cola, MD. I verified that I was speaking with the correct person using two identifiers (Name: Zaria Taha, and date of birth: 1940-11-18).  Advanced Informed Consent I sought verbal advanced consent from [place] for virtual visit interactions. I informed Ms. Selke of possible security and privacy concerns, risks, and limitations associated with providing "not-in-person" medical evaluation and management services. I also informed Ms. Niemeier of the availability of "in-person" appointments. Finally, I informed her that there would be a charge for the virtual visit and that she could be  personally, fully or partially, financially responsible for it. Ms. Kunkler expressed understanding and agreed to proceed.   Historic Elements   Ms. Alena Blankenbeckler is a 78 y.o. year old, female patient evaluated today after her last encounter by our practice on 08/27/2018. Ms. Oliveria  has a past medical history of Anxiety, Aortic insufficiency, Arthritis, Asthma, Bronchitis, Bursitis, hip, Claustrophobia, Claustrophobia, Dysrhythmia, GERD  (gastroesophageal reflux disease), Gout, Hyperlipidemia, Hypertension, Hypothyroidism, Mitral regurgitation, Obesity, Panic attacks, Paroxysmal atrial fibrillation ([place]), PTSD (post-traumatic stress disorder), Rhinitis, Sick sinus syndrome ([place]), and Sleep apnea. She also  has a past surgical history that includes Tonsillectomy; Pacemaker insertion; Status post child birth x1; Rotator cuff repair; Dilatation & curettage/hysteroscopy with myosure (N/A, 08/03/2015); Insert / replace / remove pacemaker; Total hip arthroplasty (Left, 12/22/2015); Cardiac catheterization; and Total hip revision (Left, 03/01/2018). Ms. Matar has a current medication list which includes the following prescription(s): allopurinol, colchicine, diazepam, liothyronine, metoprolol tartrate, oxycodone, oxycodone, oxycodone, spironolactone, thyroid, and warfarin. She  reports that she has never smoked. She has never used smokeless tobacco. She reports that she does not drink alcohol or use drugs. Ms. Vallin is allergic to contrast media [iodinated diagnostic agents].   HPI  Today, she is being contacted for medication management.  The patient indicates doing well on her medication regimen.  She denies any type of side effects or adverse reactions to the oxycodone.  Her pain seems to have improved some and she is requesting today that we lower the prescription from 5 times a day to every 6 hours.  We will take care of that for her today.   Pharmacotherapy Assessment  Analgesic: Oxycodone IR 5 mg 1 tablet p.o. every 6 hours (20 mg/day) MME/day:30mg /day.   Monitoring: Pharmacotherapy: No side-effects or adverse reactions reported. [place] PMP: PDMP reviewed during this encounter.       Compliance: No problems identified. Effectiveness: Clinically acceptable. Plan: Refer to "POC".  Pertinent Labs   SAFETY SCREENING Profile Lab Results  Component Value Date   STAPHAUREUS NEGATIVE 02/21/2018   MRSAPCR NEGATIVE 02/21/2018   Renal  Function Lab Results  Component Value Date   BUN 24 (H) 03/03/2018   CREATININE  1.05 (H) 03/03/2018   BCR 25 03/07/2017   GFRAA 59 (L) 03/03/2018   GFRNONAA 51 (L) 03/03/2018   Hepatic Function Lab Results  Component Value Date   AST 24 02/21/2018   ALT 13 02/21/2018   ALBUMIN 3.8 02/21/2018   UDS Summary  Date Value Ref Range Status  12/05/2016 FINAL  Final    Comment:    ==================================================================== TOXASSURE SELECT 13 (MW) ==================================================================== Test                             Result       Flag       Units Drug Present and Declared for Prescription Verification   Oxazepam                       30           EXPECTED   ng/mg creat   Temazepam                      17           EXPECTED   ng/mg creat    Oxazepam and temazepam are expected metabolites of diazepam.    Oxazepam is also an expected metabolite of other benzodiazepine    drugs, including chlordiazepoxide, prazepam, clorazepate,    halazepam, and temazepam.  Oxazepam and temazepam are available    as scheduled prescription medications.   Oxycodone                      567          EXPECTED   ng/mg creat   Oxymorphone                    148          EXPECTED   ng/mg creat   Noroxycodone                   1602         EXPECTED   ng/mg creat   Noroxymorphone                 2684         EXPECTED   ng/mg creat    Sources of oxycodone are scheduled prescription medications.    Oxymorphone, noroxycodone, and noroxymorphone are expected    metabolites of oxycodone. Oxymorphone is also available as a    scheduled prescription medication. ==================================================================== Test                      Result    Flag   Units      Ref Range   Creatinine              141              mg/dL      >=20 ==================================================================== Declared Medications:  The flagging and  interpretation on this report are based on the  following declared medications.  Unexpected results may arise from  inaccuracies in the declared medications.  **Note: The testing scope of this panel includes these medications:  Diazepam (Valium)  Oxycodone (Roxicodone)  **Note: The testing scope of this panel does not include following  reported medications:  Colchicine  Hydrochlorothiazide (Maxzide)  Levothyroxine  Metoprolol (Lopressor)  Supplement  Triamterene (Maxzide)  Warfarin ==================================================================== For clinical consultation, please call [phone]. ====================================================================  Note: Above Lab results reviewed.  Recent imaging  CUP PACEART INCLINIC DEVICE CHECK Pacemaker check in clinic. Normal device function. Thresholds, sensing, impedances consistent with previous measurements. Device programmed to maximize longevity. Permanent AF, +warfarin. No episode triggers enabled. Device programmed at appropriate  safety margins. Histogram distribution appropriate for patient activity level. Device programmed to optimize intrinsic conduction. Estimated longevity 1.25 years. Patient wishes to unenroll from TTMs due to no landline phone. Patient education completed.  ROV with SK/B in 4 months.Levander Campion BSN, RN, CCDS  Assessment  The primary encounter diagnosis was Chronic hip pain (Primary Area of Pain) (Left). Diagnoses of Osteoarthritis of sacroiliac joint (with vacuum phenomena in) (Bilateral), Lumbar facet arthropathy ([place]), Spinal stenosis of lumbar region, unspecified whether neurogenic claudication present, and Chronic pain syndrome were also pertinent to this visit.  Plan of Care  I have discontinued Marua Currie's oxyCODONE and oxyCODONE. I have also changed her oxyCODONE. Additionally, I am having her start on oxyCODONE and oxyCODONE. Lastly, I am having her maintain her  colchicine, diazepam, liothyronine, thyroid, allopurinol, spironolactone, warfarin, and metoprolol tartrate.  Pharmacotherapy (Medications Ordered): Meds ordered this encounter  Medications  . oxyCODONE (OXY IR/ROXICODONE) 5 MG immediate release tablet    Sig: Take 1 tablet (5 mg total) by mouth every 6 (six) hours as needed for severe pain. Must last 30 days    Dispense:  120 tablet    Refill:  0    Chronic Pain: STOP Act (Not applicable) Fill 1 day early if closed on refill date. Do not fill until: 09/04/2018. To last until: 10/04/2018. Avoid benzodiazepines within 8 hours of opioids  . oxyCODONE (OXY IR/ROXICODONE) 5 MG immediate release tablet    Sig: Take 1 tablet (5 mg total) by mouth every 6 (six) hours as needed for severe pain. Must last 30 days    Dispense:  120 tablet    Refill:  0    Chronic Pain: STOP Act (Not applicable) Fill 1 day early if closed on refill date. Do not fill until: 10/04/2018. To last until: 11/03/2018. Avoid benzodiazepines within 8 hours of opioids  . oxyCODONE (OXY IR/ROXICODONE) 5 MG immediate release tablet    Sig: Take 1 tablet (5 mg total) by mouth every 6 (six) hours as needed for severe pain. Must last 30 days    Dispense:  120 tablet    Refill:  0    Chronic Pain: STOP Act (Not applicable) Fill 1 day early if closed on refill date. Do not fill until: 11/03/2018. To last until: 12/03/2018. Avoid benzodiazepines within 8 hours of opioids   Orders:  No orders of the defined types were placed in this encounter.  Follow-up plan:   Return in about 13 weeks (around 11/27/2018) for (VV), E/M (PP).     Considering:   None at this time.    Palliative PRN treatment(s):   None at this time.     Recent Visits No visits were found meeting these conditions.  Showing recent visits within past 90 days and meeting all other requirements   Today's Visits Date Type Provider Dept  08/28/18 Office Visit Milinda Pointer, MD [facility]  Showing today's  visits and meeting all other requirements   Future Appointments No visits were found meeting these conditions.  Showing future appointments within next 90 days and meeting all other requirements   I discussed the assessment and treatment plan with the patient. The patient was provided an opportunity to ask questions and all were answered.  The patient agreed with the plan and demonstrated an understanding of the instructions.  Patient advised to call back or seek an in-person evaluation if the symptoms or condition worsens.  Total duration of non-face-to-face encounter: 12 minutes.  Note by: Gaspar Cola, MD Date: 08/28/2018; Time: 12:13 PM  Note: This dictation was prepared with Dragon dictation. Any transcriptional errors that may result from this process are unintentional.  Disclaimer:  * Given the special circumstances of the COVID-19 pandemic, the federal government has announced that the Office for Civil Rights (OCR) will exercise its enforcement discretion and will not impose penalties on physicians using telehealth in the event of noncompliance with regulatory requirements under the [place] and [place] (HIPAA) in connection with the good faith provision of telehealth during the ZCHYI-50 national public health emergency. ([place])

## 2018-09-03 ENCOUNTER — Other Ambulatory Visit: Payer: Self-pay

## 2018-09-03 ENCOUNTER — Encounter: Payer: Self-pay | Admitting: Cardiovascular Disease

## 2018-09-03 ENCOUNTER — Ambulatory Visit (INDEPENDENT_AMBULATORY_CARE_PROVIDER_SITE_OTHER): Payer: Medicare Other | Admitting: Cardiovascular Disease

## 2018-09-03 VITALS — BP 128/80 | HR 70 | Ht 61.0 in | Wt 188.0 lb

## 2018-09-03 DIAGNOSIS — I482 Chronic atrial fibrillation, unspecified: Secondary | ICD-10-CM

## 2018-09-03 DIAGNOSIS — I10 Essential (primary) hypertension: Secondary | ICD-10-CM

## 2018-09-03 NOTE — Progress Notes (Signed)
Cardiology Office Note   Date:  09/03/2018   ID:  [name], DOB Oct 02, 1940, MRN [id number]  PCP:  Bryson Corona, NP  Cardiologist:   Kathlyn Sacramento, MD   Chief Complaint  Patient presents with  . other    Transfer from [place]., Medications reviewed verballty.      History of Present Illness: [name] is a 78 y.o. female who presents for to establish cardiovascular care.  She is transferring from Dr. Harrington Challenger given that she now lives closer to [place].  She has known history of chronic atrial fibrillation treated with rate control and long-term anticoagulation with warfarin as well as permanent pacemaker placement followed by Dr. Caryl Comes.  Other medical issues include essential hypertension and gout. She has been doing very well with no recent chest pain, shortness of breath or palpitations.  No significant leg edema.  No complications with anticoagulation.    Past Medical History:  Diagnosis Date  . Anxiety   . Aortic insufficiency    Trivial  . Arthritis   . Asthma   . Bronchitis   . Bursitis, hip   . Claustrophobia   . Claustrophobia   . Dysrhythmia    a fib  . GERD (gastroesophageal reflux disease)    history of  . Gout   . Hyperlipidemia   . Hypertension   . Hypothyroidism   . Mitral regurgitation   . Obesity   . Panic attacks   . Paroxysmal atrial fibrillation ([facility])   . PTSD (post-traumatic stress disorder)   . Rhinitis   . Sick sinus syndrome ([place])   . Sleep apnea    was ordered CPAP, but never fitted for one years ago, Dr has retired    Past Surgical History:  Procedure Laterality Date  . CARDIAC CATHETERIZATION    . DILATATION & CURETTAGE/HYSTEROSCOPY WITH MYOSURE N/A 08/03/2015   Procedure: DILATATION & CURETTAGE/HYSTEROSCOPY WITH MYOSURE;  Surgeon: Alden Hipp, MD;  Location: [facility];  Service: Gynecology;  Laterality: N/A;  Patient has an ICD   . INSERT / REPLACE / REMOVE PACEMAKER    . PACEMAKER INSERTION     Status post  insertion  . ROTATOR CUFF REPAIR     left  . Status post child birth x1    . TONSILLECTOMY    . TOTAL HIP ARTHROPLASTY Left 12/22/2015   Procedure: TOTAL HIP ARTHROPLASTY;  Surgeon: Dereck Leep, MD;  Location: [facility];  Service: Orthopedics;  Laterality: Left;  . TOTAL HIP REVISION Left 03/01/2018   Procedure: TOTAL HIP REVISION;  Surgeon: Dereck Leep, MD;  Location: [facility];  Service: Orthopedics;  Laterality: Left;     Current Outpatient Medications  Medication Sig Dispense Refill  . allopurinol (ZYLOPRIM) 100 MG tablet Take 2 tablets by mouth daily.     . colchicine 0.6 MG tablet Take 0.6 mg by mouth daily as needed (GOUT).     Marland Kitchen diazepam (VALIUM) 10 MG tablet Take 10 mg by mouth 2 (two) times daily as needed for anxiety.    Marland Kitchen liothyronine (CYTOMEL) 5 MCG tablet Take 15 mcg by mouth daily.     . metoprolol tartrate (LOPRESSOR) 50 MG tablet TAKE 1 TABLET(50 MG) BY MOUTH TWICE DAILY (Patient taking differently: 75 mg 2 (two) times daily. ) 180 tablet 0  . [START ON 09/04/2018] oxyCODONE (OXY IR/ROXICODONE) 5 MG immediate release tablet Take 1 tablet (5 mg total) by mouth every 6 (six) hours as needed for severe pain. Must  last 30 days 120 tablet 0  . [START ON 10/04/2018] oxyCODONE (OXY IR/ROXICODONE) 5 MG immediate release tablet Take 1 tablet (5 mg total) by mouth every 6 (six) hours as needed for severe pain. Must last 30 days 120 tablet 0  . [START ON 11/03/2018] oxyCODONE (OXY IR/ROXICODONE) 5 MG immediate release tablet Take 1 tablet (5 mg total) by mouth every 6 (six) hours as needed for severe pain. Must last 30 days 120 tablet 0  . spironolactone (ALDACTONE) 100 MG tablet Take 100 mg by mouth daily.    Marland Kitchen thyroid (ARMOUR) 60 MG tablet Take 60 mg by mouth daily before breakfast.     . warfarin (COUMADIN) 2.5 MG tablet Take as directed by [facility] 110 tablet 1   No current facility-administered medications for this visit.     Allergies:   Contrast media [iodinated  diagnostic agents]    Social History:  The patient  reports that she has never smoked. She has never used smokeless tobacco. She reports that she does not drink alcohol or use drugs.   Family History:  The patient's family history includes Alzheimer's disease in her father; Arthritis in her sister; Cancer in her brother and maternal grandfather; Heart disease in her mother; Hypertension in her sister; Stroke in her mother.    ROS:  Please see the history of present illness.   Otherwise, review of systems are positive for none.   All other systems are reviewed and negative.    PHYSICAL EXAM: VS:  BP 128/80 (BP Location: Left Arm, Patient Position: Sitting, Cuff Size: Normal)   Pulse 70   Ht 5\' 1"  (1.549 m)   Wt 188 lb (85.3 kg)   BMI 35.52 kg/m  , BMI Body mass index is 35.52 kg/m. GEN: Well nourished, well developed, in no acute distress  HEENT: normal  Neck: no JVD, carotid bruits, or masses Cardiac: Irregularly irregular; no murmurs, rubs, or gallops,no edema  Respiratory:  clear to auscultation bilaterally, normal work of breathing GI: soft, nontender, nondistended, + BS MS: no deformity or atrophy  Skin: warm and dry, no rash Neuro:  Strength and sensation are intact Psych: euthymic mood, full affect   EKG:  EKG is ordered today. The ekg ordered today demonstrates atrial fibrillation with a ventricular rate of 70 bpm.  Low voltage.   Recent Labs: 02/21/2018: ALT 13 03/03/2018: BUN 24; Creatinine, Ser 1.05; Hemoglobin 11.9; Platelets 128; Potassium 4.1; Sodium 139    Lipid Panel    Component Value Date/Time   CHOL 208 (H) 11/07/2013 1424   TRIG 142.0 11/07/2013 1424   HDL 50.00 11/07/2013 1424   CHOLHDL 4 11/07/2013 1424   VLDL 28.4 11/07/2013 1424   LDLCALC 130 (H) 11/07/2013 1424      Wt Readings from Last 3 Encounters:  09/03/18 188 lb (85.3 kg)  04/30/18 191 lb (86.6 kg)  03/01/18 191 lb (86.6 kg)       No flowsheet data found.    ASSESSMENT AND  PLAN:  1.  1.  Chronic atrial fibrillation: Doing well with rate control and long-term anticoagulation with warfarin.  She does not wish to consider new anticoagulation medications as she has not had any complications with warfarin.  2.  Status post pacemaker placement: Followed by Dr. Caryl Comes.  3.  Essential hypertension: Blood pressure is controlled on current medications.    Disposition:   FU with me in 6 months  Signed,  Kathlyn Sacramento, MD  09/03/2018 5:10 PM  [facility]

## 2018-09-03 NOTE — Patient Instructions (Addendum)
Medication Instructions:  - Your physician recommends that you continue on your current medications as directed. Please refer to the Current Medication list given to you today.  If you need a refill on your cardiac medications before your next appointment, please call your pharmacy.   Lab work: - none ordered  If you have labs (blood work) drawn today and your tests are completely normal, you will receive your results only by: Marland Kitchen MyChart Message (if you have MyChart) OR . A paper copy in the mail If you have any lab test that is abnormal or we need to change your treatment, we will call you to review the results.  Testing/Procedures: -none ordered  Follow-Up: At [facility], you and your health needs are our priority.  As part of our continuing mission to provide you with exceptional heart care, we have created designated Provider Care Teams.  These Care Teams include your primary Cardiologist (physician) and Advanced Practice Providers (APPs -  Physician Assistants and Nurse Practitioners) who all work together to provide you with the care you need, when you need it. You will need a follow up appointment in 6 months (January).  Please call our office 2 months in advance to schedule this appointment (call in early November to schedule)  You may see Kathlyn Sacramento, MD or one of the following Advanced Practice Providers on your designated Care Team:   Murray Hodgkins, NP Christell Faith, PA-C . Marrianne Mood, PA-C  Any Other Special Instructions Will Be Listed Below (If Applicable). - N/A

## 2018-09-05 ENCOUNTER — Telehealth: Payer: Medicare Other | Admitting: Internal Medicine

## 2018-09-10 ENCOUNTER — Ambulatory Visit (INDEPENDENT_AMBULATORY_CARE_PROVIDER_SITE_OTHER): Payer: Medicare Other | Admitting: Internal Medicine

## 2018-09-10 ENCOUNTER — Other Ambulatory Visit: Payer: Self-pay

## 2018-09-10 ENCOUNTER — Encounter: Payer: Self-pay | Admitting: Internal Medicine

## 2018-09-10 ENCOUNTER — Telehealth: Payer: Self-pay

## 2018-09-10 ENCOUNTER — Telehealth: Payer: Self-pay | Admitting: Internal Medicine

## 2018-09-10 VITALS — BP 132/64 | HR 78 | Ht 61.0 in | Wt 198.4 lb

## 2018-09-10 DIAGNOSIS — I4821 Permanent atrial fibrillation: Secondary | ICD-10-CM

## 2018-09-10 DIAGNOSIS — R001 Bradycardia, unspecified: Secondary | ICD-10-CM

## 2018-09-10 DIAGNOSIS — Z950 Presence of cardiac pacemaker: Secondary | ICD-10-CM

## 2018-09-10 LAB — CUP PACEART INCLINIC DEVICE CHECK
Battery Impedance: 16100 Ohm
Battery Remaining Longevity: 9 mo
Battery Voltage: 2.69 V
Brady Statistic RV Percent Paced: 28 %
Date Time Interrogation Session: 20200714115123
Implantable Lead Implant Date: 20090317
Implantable Lead Implant Date: 20090317
Implantable Lead Location: 753859
Implantable Lead Location: 753860
Implantable Pulse Generator Implant Date: 20090317
Lead Channel Impedance Value: 523 Ohm
Lead Channel Pacing Threshold Amplitude: 0.5 V
Lead Channel Pacing Threshold Pulse Width: 0.4 ms
Lead Channel Setting Pacing Amplitude: 2.5 V
Lead Channel Setting Pacing Pulse Width: 0.4 ms
Lead Channel Setting Sensing Sensitivity: 2 mV
Pulse Gen Model: 5826
Pulse Gen Serial Number: 2056081

## 2018-09-10 MED ORDER — APIXABAN 5 MG PO TABS: 5.0000 mg | ORAL_TABLET | Freq: Two times a day (BID) | ORAL | 6 refills | Status: DC

## 2018-09-10 NOTE — Progress Notes (Signed)
Patient Care Team: Briles, Jodelle Green, NP as PCP - General (Family Medicine) Fay Records, MD as PCP - Cardiology (Cardiology)   HPI  [name] is a 78 y.o. female Seen in follow-up for pacemaker implanted 2010 by Dr. Elliot Cousin for bradycardia in the context of permanent atrial fibrillation.  Anticoagulated with warfarin   Date Cr K Hgb  8/19 1.1  12.1  1/20 1.05 4.1 14.9    The patient denies chest pain, shortness of breath, nocturnal dyspnea, orthopnea or peripheral edema.  There have been no palpitations, lightheadedness or syncope.    When seen 12/19,  rash-pruritic that has emerged over device, treated with topical steroids  Unfortunately not improved  Records and Results Reviewed   Past Medical History:  Diagnosis Date  . Anxiety   . Aortic insufficiency    Trivial  . Arthritis   . Asthma   . Bronchitis   . Bursitis, hip   . Claustrophobia   . Claustrophobia   . Dysrhythmia    a fib  . GERD (gastroesophageal reflux disease)    history of  . Gout   . Hyperlipidemia   . Hypertension   . Hypothyroidism   . Mitral regurgitation   . Obesity   . Panic attacks   . Paroxysmal atrial fibrillation (HCC)   . PTSD (post-traumatic stress disorder)   . Rhinitis   . Sick sinus syndrome ([place])   . Sleep apnea    was ordered CPAP, but never fitted for one years ago, Dr has retired    Past Surgical History:  Procedure Laterality Date  . CARDIAC CATHETERIZATION    . DILATATION & CURETTAGE/HYSTEROSCOPY WITH MYOSURE N/A 08/03/2015   Procedure: DILATATION & CURETTAGE/HYSTEROSCOPY WITH MYOSURE;  Surgeon: Alden Hipp, MD;  Location: [facility];  Service: Gynecology;  Laterality: N/A;  Patient has an ICD   . INSERT / REPLACE / REMOVE PACEMAKER    . PACEMAKER INSERTION     Status post insertion  . ROTATOR CUFF REPAIR     left  . Status post child birth x1    . TONSILLECTOMY    . TOTAL HIP ARTHROPLASTY Left 12/22/2015   Procedure: TOTAL HIP ARTHROPLASTY;  Surgeon: Dereck Leep, MD;  Location: [facility];  Service: Orthopedics;  Laterality: Left;  . TOTAL HIP REVISION Left 03/01/2018   Procedure: TOTAL HIP REVISION;  Surgeon: Dereck Leep, MD;  Location: [facility];  Service: Orthopedics;  Laterality: Left;    Current Meds  Medication Sig  . allopurinol (ZYLOPRIM) 100 MG tablet Take 2 tablets by mouth daily.   . colchicine 0.6 MG tablet Take 0.6 mg by mouth daily as needed (GOUT).   Marland Kitchen diazepam (VALIUM) 10 MG tablet Take 10 mg by mouth 2 (two) times daily as needed for anxiety.  Marland Kitchen liothyronine (CYTOMEL) 5 MCG tablet Take 15 mcg by mouth daily.   . metoprolol tartrate (LOPRESSOR) 50 MG tablet TAKE 1 TABLET(50 MG) BY MOUTH TWICE DAILY (Patient taking differently: 75 mg 2 (two) times daily. )  . oxyCODONE (OXY IR/ROXICODONE) 5 MG immediate release tablet Take 1 tablet (5 mg total) by mouth every 6 (six) hours as needed for severe pain. Must last 30 days  . [START ON 10/04/2018] oxyCODONE (OXY IR/ROXICODONE) 5 MG immediate release tablet Take 1 tablet (5 mg total) by mouth every 6 (six) hours as needed for severe pain. Must last 30 days  . [START ON 11/03/2018] oxyCODONE (OXY IR/ROXICODONE) 5 MG immediate release  tablet Take 1 tablet (5 mg total) by mouth every 6 (six) hours as needed for severe pain. Must last 30 days  . spironolactone (ALDACTONE) 100 MG tablet Take 100 mg by mouth daily.  Marland Kitchen thyroid (ARMOUR) 60 MG tablet Take 60 mg by mouth daily before breakfast.   . warfarin (COUMADIN) 2.5 MG tablet Take as directed by [facility]    Allergies  Allergen Reactions  . Contrast Media [Iodinated Diagnostic Agents] Rash    Contrast dye causes swelling      Review of Systems negative except from HPI and PMH  Physical Exam BP 132/64 (BP Location: Left Arm, Patient Position: Sitting, Cuff Size: Normal)   Pulse 78   Ht 5\' 1"  (1.549 m)   Wt 198 lb 6.4 oz (90 kg)   SpO2 98%   BMI 37.49 kg/m  Well developed and well nourished in no acute distress HENT normal  Neck supple with JVP-flat Clear Device pocket well healed; without hematoma or erythema.  There is no tethering  Irregularly Irregular  Abd-soft with active BS No Clubbing cyanosis  tr edema Skin-warm and dry A & Oriented  Grossly normal sensory and motor function  ECG Personally reviewed  09/03/18   Afib @ 48 -/09/40 rsR'   Assessment and  Plan  Atrial fibrillation   Bradycardia symptomatic  High Risk Medication Surveillance  Pacemaker St Jude   Device approaching ERI   Rash    Vp at 28% with hysteresis rate of 50  Rash persistent and have suffested she see dermatology  On Anticoagulation;  No bleeding issues but would like to switch from coumadin to anticoagulation   We discussed the use of the NOACs compared to Coumadin. We briefly reviewed the data of at least comparability in stroke prevention, bleeding and outcome. We discussed some of the new once wherein somewhat associated with decreased ischemic stroke risk, one to be taken daily, and has been shown to be comparable and bleeding risk to aspirin.  We also discussed bleeding associated with warfarin as well as NOACs and a wall bleeding as a complication of all these drugs intracranial bleeding is more frequently associated with warfarin then the NOACs and a GI bleeding is more commonly associated with the latter  She would like to begin apixoban  We spent more than 50% of our >25 min visit in face to face counseling regarding the above  Will get her blood work results from her PCP  Current medicines are reviewed at length with the patient today .  The patient does not  have concerns regarding medicines.

## 2018-09-10 NOTE — Patient Instructions (Addendum)
Medication Instructions:  - Donah Driver will call you about transitioning off of your warfarin and on to eliquis Do not start this medication until she calls you.  - You will be starting eliquis 5 mg- take 1 tablet by mouth twice daily  Eliquis 5 mg samples given today Lot: [id number] Exp: 9/22 # 3 boxes given (Free 30-day trial card given)  If you need a refill on your cardiac medications before your next appointment, please call your pharmacy.   Lab work: - call the office when you are able and let us know if you have had recent Potassium level done and what the number is- [phone], Heather, RN  If you have labs (blood work) drawn today and your tests are completely normal, you will receive your results only by: Marland Kitchen MyChart Message (if you have MyChart) OR . A paper copy in the mail If you have any lab test that is abnormal or we need to change your treatment, we will call you to review the results.  Testing/Procedures: - none ordered  Follow-Up: At [facility], you and your health needs are our priority.  As part of our continuing mission to provide you with exceptional heart care, we have created designated Provider Care Teams.  These Care Teams include your primary Cardiologist (physician) and Advanced Practice Providers (APPs -  Physician Assistants and Nurse Practitioners) who all work together to provide you with the care you need, when you need it.  . You will need a follow up appointment in 6 months with Dr. Caryl Comes (January) . Please call our office 2 months in advance to schedule this appointment. (Call in early November to schedule).  Any Other Special Instructions Will Be Listed Below (If Applicable). - N/A

## 2018-09-10 NOTE — Telephone Encounter (Signed)
To Dr. Caryl Comes to revew.

## 2018-09-10 NOTE — Telephone Encounter (Signed)
Patient calling in to let Dr. Caryl Comes know that her potassium is 3.9 as of 08/22/18

## 2018-09-10 NOTE — Telephone Encounter (Signed)

## 2018-09-10 NOTE — Telephone Encounter (Signed)
Thanks  K is within range

## 2018-09-11 ENCOUNTER — Ambulatory Visit (INDEPENDENT_AMBULATORY_CARE_PROVIDER_SITE_OTHER): Payer: Medicare Other

## 2018-09-11 DIAGNOSIS — I4891 Unspecified atrial fibrillation: Secondary | ICD-10-CM

## 2018-09-11 DIAGNOSIS — Z5181 Encounter for therapeutic drug level monitoring: Secondary | ICD-10-CM | POA: Diagnosis not present

## 2018-09-11 LAB — POCT INR: INR: 2.3 (ref 2.0–3.0)

## 2018-09-11 NOTE — Progress Notes (Signed)
Pt was started on Eliquis for permanent atrial fibrillation on September 12, 2018.    Reviewed patients medication list.  Pt is not currently on any combined P-gp and strong CYP3A4 inhibitors/inducers (ketoconazole, traconazole, ritonavir, carbamazepine, phenytoin, rifampin, St. John's wort).  Reviewed labs.  SCr 1.05, Weight 90 kg, CrCl- 62.74.  Dose appropriate based on age, weight, and SCr.  Hgb and HCT Within Normal Limits  A full discussion of the nature of anticoagulants has been carried out.  A benefit/risk analysis has been presented to the patient, so that they understand the justification for choosing anticoagulation with Eliquis at this time.  The need for compliance is stressed.  Pt is aware to take the medication twice daily.  Side effects of potential bleeding are discussed, including unusual colored urine or stools, coughing up blood or coffee ground emesis, nose bleeds or serious fall or head trauma.  Discussed signs and symptoms of stroke. The patient should avoid any OTC items containing aspirin or ibuprofen.  Avoid alcohol consumption.   Call if any signs of abnormal bleeding.  Discussed financial obligations and resolved any difficulty in obtaining medication.  Next lab test in 6 months.

## 2018-09-11 NOTE — Patient Instructions (Signed)
Please stop warfarin.  Please start Eliquis 5 mg tomorrow night, then take twice daily every day.

## 2018-09-13 NOTE — Telephone Encounter (Signed)
I spoke with the patient. She is aware Dr. Caryl Comes has reviewed the potassium level that she called in and that it is fine.

## 2018-09-27 ENCOUNTER — Other Ambulatory Visit: Payer: Self-pay | Admitting: Internal Medicine

## 2018-09-27 ENCOUNTER — Other Ambulatory Visit: Payer: Self-pay

## 2018-09-27 NOTE — Telephone Encounter (Signed)
*  STAT* If patient is at the pharmacy, call can be transferred to refill team.   1. Which medications need to be refilled? (please list name of each medication and dose if known) Metoprolol  2. Which pharmacy/location (including street and city if local pharmacy) is medication to be sent to?  [place]  3. Do they need a 30 day or 90 day supply? [place]

## 2018-09-30 MED ORDER — METOPROLOL TARTRATE 50 MG PO TABS: 75.0000 mg | ORAL_TABLET | Freq: Two times a day (BID) | ORAL | 3 refills | Status: DC

## 2018-09-30 NOTE — Telephone Encounter (Signed)
Patient calling to check status of refill.   She also states we always send in the wrong dose   Patient is taking 75 mg po BID

## 2018-10-02 ENCOUNTER — Encounter: Payer: Self-pay | Admitting: Internal Medicine

## 2018-10-31 DIAGNOSIS — B351 Tinea unguium: Secondary | ICD-10-CM | POA: Diagnosis not present

## 2018-10-31 DIAGNOSIS — M79675 Pain in left toe(s): Secondary | ICD-10-CM | POA: Diagnosis not present

## 2018-10-31 DIAGNOSIS — M79674 Pain in right toe(s): Secondary | ICD-10-CM | POA: Diagnosis not present

## 2018-11-14 ENCOUNTER — Encounter: Payer: Self-pay | Admitting: Pain Medicine

## 2018-11-17 NOTE — Progress Notes (Signed)
Pain Management Virtual Encounter Note - Virtual Visit via Telephone Telehealth (real-time audio visits between healthcare provider and patient).   Patient's Phone No. & Preferred Pharmacy:  [phone] (home); [phone] (mobile); (Preferred) [phone] [name]  [facility] [id number], [address] [place] [address] Phone: [phone] Fax: [phone]    Pre-screening note:  Our staff contacted [name] and offered her an "in person", "face-to-face" appointment versus a telephone encounter. She indicated preferring the telephone encounter, at this time.   Reason for Virtual Visit: COVID-19*  Social distancing based on CDC and AMA recommendations.   I contacted [name] on 11/18/2018 via telephone.      I clearly identified myself as Gaspar Cola, MD. I verified that I was speaking with the correct person using two identifiers (Name: [name], and date of birth: [birth year]).  Advanced Informed Consent I sought verbal advanced consent from [name] for virtual visit interactions. I informed [name] of possible security and privacy concerns, risks, and limitations associated with providing "not-in-person" medical evaluation and management services. I also informed [name] of the availability of "in-person" appointments. Finally, I informed her that there would be a charge for the virtual visit and that she could be  personally, fully or partially, financially responsible for it. [name] expressed understanding and agreed to proceed.   Historic Elements   [name] is a 78 y.o. year old, female patient evaluated today after her last encounter by our practice on 08/28/2018. [name]  has a past medical history of Anxiety, Aortic insufficiency, Arthritis, Asthma, Bronchitis, Bursitis, hip, Claustrophobia, Claustrophobia, Dysrhythmia, GERD  (gastroesophageal reflux disease), Gout, Hyperlipidemia, Hypertension, Hypothyroidism, Mitral regurgitation, Obesity, Panic attacks, Paroxysmal atrial fibrillation ([place]), PTSD (post-traumatic stress disorder), Rhinitis, Sick sinus syndrome ([place]), and Sleep apnea. She also  has a past surgical history that includes Tonsillectomy; Pacemaker insertion; Status post child birth x1; Rotator cuff repair; Dilatation & curettage/hysteroscopy with myosure (N/A, 08/03/2015); Insert / replace / remove pacemaker; Total hip arthroplasty (Left, 12/22/2015); Cardiac catheterization; and Total hip revision (Left, 03/01/2018). [name] has a current medication list which includes the following prescription(s): allopurinol, apixaban, colchicine, diazepam, liothyronine, metoprolol tartrate, oxycodone, oxycodone, oxycodone, spironolactone, thyroid, and pregabalin. She  reports that she has never smoked. She has never used smokeless tobacco. She reports that she does not drink alcohol or use drugs. [name] is allergic to contrast media [iodinated diagnostic agents].   HPI  Today, she is being contacted for medication management.  The patient indicates doing well with the current medication regimen. No adverse reactions or side effects reported to the medications.  The patient indicates still having bilateral hip pain with the pain being equally as bad on the right as to the left.  She recently had surgery on January 3 on the left hip where she had a revision of her total hip replacement.  She has never had any surgery on the right hip.  However, she indicates having pain both hips.  There are no recent x-rays of the hip on record and therefore today we will go ahead and order some.  In addition to that, the patient indicates having been switched from the Coumadin to the Eliquis.  Despite the fact that she is on oxycodone IR 5 mg every 6 hours, she indicates having some pain and because she cannot have nonsteroidal anti-inflammatory  drugs because of the anticoagulation, I  think that she would benefit from a Lyrica trial, she which she indicates that she has never had.  Today we will start her on 25 mg p.o. at bedtime and after 2 weeks, we will move on to twice daily.  I will see her back in about 30 days for reevaluation on her Lyrica trial.  And to review the x-rays of her hips, which I will be ordering today.  Pharmacotherapy Assessment  Analgesic: Oxycodone IR 5 mg 1 tablet p.o. every 6 hours (20 mg/day of oxycodone) (has enough to last until 03/03/2019) MME/day:30mg /day.   Monitoring: Pharmacotherapy: No side-effects or adverse reactions reported. [place] PMP: PDMP reviewed during this encounter.       Compliance: No problems identified. Effectiveness: Clinically acceptable. Plan: Refer to "POC".  UDS:  Summary  Date Value Ref Range Status  12/05/2016 FINAL  Final    Comment:    ==================================================================== TOXASSURE SELECT 13 (MW) ==================================================================== Test                             Result       Flag       Units Drug Present and Declared for Prescription Verification   Oxazepam                       30           EXPECTED   ng/mg creat   Temazepam                      17           EXPECTED   ng/mg creat    Oxazepam and temazepam are expected metabolites of diazepam.    Oxazepam is also an expected metabolite of other benzodiazepine    drugs, including chlordiazepoxide, prazepam, clorazepate,    halazepam, and temazepam.  Oxazepam and temazepam are available    as scheduled prescription medications.   Oxycodone                      567          EXPECTED   ng/mg creat   Oxymorphone                    148          EXPECTED   ng/mg creat   Noroxycodone                   1602         EXPECTED   ng/mg creat   Noroxymorphone                 2684         EXPECTED   ng/mg creat    Sources of oxycodone are scheduled prescription  medications.    Oxymorphone, noroxycodone, and noroxymorphone are expected    metabolites of oxycodone. Oxymorphone is also available as a    scheduled prescription medication. ==================================================================== Test                      Result    Flag   Units      Ref Range   Creatinine              141              mg/dL      >=20 ====================================================================  Declared Medications:  The flagging and interpretation on this report are based on the  following declared medications.  Unexpected results may arise from  inaccuracies in the declared medications.  **Note: The testing scope of this panel includes these medications:  Diazepam (Valium)  Oxycodone (Roxicodone)  **Note: The testing scope of this panel does not include following  reported medications:  Colchicine  Hydrochlorothiazide (Maxzide)  Levothyroxine  Metoprolol (Lopressor)  Supplement  Triamterene (Maxzide)  Warfarin ==================================================================== For clinical consultation, please call [phone]. ====================================================================    Laboratory Chemistry Profile (12 mo)  Renal: 03/03/2018: BUN 24; Creatinine, Ser 1.05  Lab Results  Component Value Date   GFR 53.86 (L) 11/07/2013   GFRAA 59 (L) 03/03/2018   GFRNONAA 51 (L) 03/03/2018   Hepatic: 02/21/2018: Albumin 3.8 Lab Results  Component Value Date   AST 24 02/21/2018   ALT 13 02/21/2018   Other: 02/25/2018: CRP <0.8; Sed Rate 22 Note: Above Lab results reviewed.  Imaging  Last 90 days:  No results found.  Assessment  The primary encounter diagnosis was Chronic hip pain (Bilateral). Diagnoses of Chronic hip pain (Primary Area of Pain) (Left), Chronic hip pain s/p THR  (Left), Trochanteric bursitis of hip (Left), Spinal stenosis of lumbar region, unspecified whether neurogenic claudication present, Chronic  pain syndrome, Neurogenic pain, Pharmacologic therapy, Disorder of skeletal system, Problems influencing health status, and Chronic anticoagulation (Eliquis) were also pertinent to this visit.  Plan of Care  I am having [name] start on oxyCODONE, oxyCODONE, and pregabalin. I am also having her maintain her colchicine, diazepam, liothyronine, thyroid, allopurinol, spironolactone, apixaban, metoprolol tartrate, and oxyCODONE.  Pharmacotherapy (Medications Ordered): Meds ordered this encounter  Medications  . oxyCODONE (OXY IR/ROXICODONE) 5 MG immediate release tablet    Sig: Take 1 tablet (5 mg total) by mouth every 6 (six) hours as needed for severe pain. Must last 30 days    Dispense:  120 tablet    Refill:  0    Chronic Pain: STOP Act (Not applicable) Fill 1 day early if closed on refill date. Do not fill until: 12/03/2018. To last until: 01/02/2019. Avoid benzodiazepines within 8 hours of opioids  . oxyCODONE (OXY IR/ROXICODONE) 5 MG immediate release tablet    Sig: Take 1 tablet (5 mg total) by mouth every 6 (six) hours as needed for severe pain. Must last 30 days    Dispense:  120 tablet    Refill:  0    Chronic Pain: STOP Act (Not applicable) Fill 1 day early if closed on refill date. Do not fill until: 01/02/2019. To last until: 02/01/2019. Avoid benzodiazepines within 8 hours of opioids  . oxyCODONE (OXY IR/ROXICODONE) 5 MG immediate release tablet    Sig: Take 1 tablet (5 mg total) by mouth every 6 (six) hours as needed for severe pain. Must last 30 days    Dispense:  120 tablet    Refill:  0    Chronic Pain: STOP Act (Not applicable) Fill 1 day early if closed on refill date. Do not fill until: 02/01/2019. To last until: 03/03/2019. Avoid benzodiazepines within 8 hours of opioids  . pregabalin (LYRICA) 25 MG capsule    Sig: Take 1 capsule (25 mg total) by mouth at bedtime for 15 days, THEN 1 capsule (25 mg total) 2 (two) times daily for 15 days.    Dispense:  45 capsule    Refill:   0    Fill one day early if pharmacy is closed on scheduled refill  date. May substitute for generic if available.   Orders:  Orders Placed This Encounter  Procedures  . DG HIP UNILAT W OR W/O PELVIS 2-3 VIEWS RIGHT    Standing Status:   Future    Standing Expiration Date:   11/18/2019    Scheduling Instructions:     Please describe any evidence of DJD, such as joint narrowing, asymmetry, cysts, or any anomalies in bone density, production, or erosion.    Order Specific Question:   Reason for Exam (SYMPTOM  OR DIAGNOSIS REQUIRED)    Answer:   Right hip pain/arthralgia    Order Specific Question:   Preferred imaging location?    Answer:   [facility]    Order Specific Question:   Call Results- Best Contact Number?    Answer:   [id number] (Pain Clinic facility) (Dr. Dossie Arbour)  . DG HIP UNILAT W OR W/O PELVIS 2-3 VIEWS LEFT    Standing Status:   Future    Standing Expiration Date:   11/18/2019    Scheduling Instructions:     Please describe any evidence of DJD, such as joint narrowing, asymmetry, cysts, or any anomalies in bone density, production, or erosion.    Order Specific Question:   Reason for Exam (SYMPTOM  OR DIAGNOSIS REQUIRED)    Answer:   Right hip pain/arthralgia    Order Specific Question:   Preferred imaging location?    Answer[facility]    [facility]    Order Specific Question:   Call Results- Best Contact Number?    Answer:   [id number] (Pain Clinic facility) (Dr. Dossie Arbour)  . ToxASSURE Select 13 (MW), Urine    Volume: 30 ml(s). Minimum 3 ml of urine is needed. Document temperature of fresh sample. Indications: Long term (current) use of opiate analgesic (Z79.891)  . Comp. Metabolic Panel (12)    With GFR. Indications: Chronic Pain Syndrome (G89.4) & Pharmacotherapy [id number])    Order Specific Question:   Has the patient fasted?    Answer:   No    Order Specific Question:   CC Results    Answer:   PCP-NURSE [id number]  . Magnesium    Indication: Pharmacologic  therapy [id number])    Order Specific Question:   CC Results    Answer:   PCP-NURSE [id number]  . Vitamin B12    Indication: Pharmacologic therapy [id number]).    Order Specific Question:   CC Results    Answer:   PCP-NURSE [id number]  . Sedimentation rate    Indication: Disorder of skeletal system (M89.9)    Order Specific Question:   CC Results    Answer:   PCP-NURSE [id number]  . 25-Hydroxyvitamin D Lcms D2+D3    Indication: Disorder of skeletal system (M89.9).    Order Specific Question:   CC Results    Answer:   PCP-NURSE [id number]  . C-reactive protein    Indication: Problems influencing health status (Z78.9)    Order Specific Question:   CC Results    Answer:   PCP-NURSE [id number]   Follow-up plan:   Return in about 1 month (around 12/18/2018) for (VV), (MM) to evaluate Lyrica titration.      Considering:   None at this time.    Palliative PRN treatment(s):   None at this time.     Recent Visits Date Type Provider Dept  08/28/18 Office Visit Milinda Pointer, MD [facility]  Showing recent visits within past 90 days and meeting all other requirements   Today's Visits Date  Type Provider Dept  11/18/18 Office Visit Milinda Pointer, MD [facility]  Showing today's visits and meeting all other requirements   Future Appointments No visits were found meeting these conditions.  Showing future appointments within next 90 days and meeting all other requirements   I discussed the assessment and treatment plan with the patient. The patient was provided an opportunity to ask questions and all were answered. The patient agreed with the plan and demonstrated an understanding of the instructions.  Patient advised to call back or seek an in-person evaluation if the symptoms or condition worsens.  Total duration of non-face-to-face encounter: 15 minutes.  Note by: Gaspar Cola, MD Date: 11/18/2018; Time: 2:43 PM  Note: This dictation was prepared with  Dragon dictation. Any transcriptional errors that may result from this process are unintentional.  Disclaimer:  * Given the special circumstances of the COVID-19 pandemic, the federal government has announced that the Office for Civil Rights (OCR) will exercise its enforcement discretion and will not impose penalties on physicians using telehealth in the event of noncompliance with regulatory requirements under the [place] (HIPAA) in connection with the good faith provision of telehealth during the XX123456 national public health emergency. ([place])

## 2018-11-18 ENCOUNTER — Ambulatory Visit: Payer: Medicare Other | Attending: Pain Medicine | Admitting: Pain Medicine

## 2018-11-18 ENCOUNTER — Other Ambulatory Visit: Payer: Self-pay

## 2018-11-18 DIAGNOSIS — Z789 Other specified health status: Secondary | ICD-10-CM

## 2018-11-18 DIAGNOSIS — M25552 Pain in left hip: Secondary | ICD-10-CM | POA: Diagnosis not present

## 2018-11-18 DIAGNOSIS — M25551 Pain in right hip: Secondary | ICD-10-CM

## 2018-11-18 DIAGNOSIS — Z96642 Presence of left artificial hip joint: Secondary | ICD-10-CM | POA: Diagnosis not present

## 2018-11-18 DIAGNOSIS — M792 Neuralgia and neuritis, unspecified: Secondary | ICD-10-CM | POA: Diagnosis not present

## 2018-11-18 DIAGNOSIS — Z79899 Other long term (current) drug therapy: Secondary | ICD-10-CM

## 2018-11-18 DIAGNOSIS — G894 Chronic pain syndrome: Secondary | ICD-10-CM

## 2018-11-18 DIAGNOSIS — M48061 Spinal stenosis, lumbar region without neurogenic claudication: Secondary | ICD-10-CM

## 2018-11-18 DIAGNOSIS — M899 Disorder of bone, unspecified: Secondary | ICD-10-CM | POA: Diagnosis not present

## 2018-11-18 DIAGNOSIS — M7062 Trochanteric bursitis, left hip: Secondary | ICD-10-CM

## 2018-11-18 DIAGNOSIS — G8929 Other chronic pain: Secondary | ICD-10-CM | POA: Diagnosis not present

## 2018-11-18 DIAGNOSIS — Z7901 Long term (current) use of anticoagulants: Secondary | ICD-10-CM | POA: Diagnosis not present

## 2018-11-18 MED ORDER — OXYCODONE HCL 5 MG PO TABS: 5.0000 mg | ORAL_TABLET | Freq: Four times a day (QID) | ORAL | 0 refills | Status: DC | PRN

## 2018-11-18 MED ORDER — PREGABALIN 25 MG PO CAPS: ORAL_CAPSULE | ORAL | 0 refills | Status: DC

## 2018-11-18 NOTE — Patient Instructions (Signed)
____________________________________________________________________________________________  Medication Rules  Purpose: To inform patients, and their family members, of our rules and regulations.  Applies to: All patients receiving prescriptions (written or electronic).  Pharmacy of record: Pharmacy where electronic prescriptions will be sent. If written prescriptions are taken to a different pharmacy, please inform the nursing staff. The pharmacy listed in the electronic medical record should be the one where you would like electronic prescriptions to be sent.  Electronic prescriptions: In compliance with the [place] Strengthen Opioid Misuse Prevention (STOP) Act of 2017 (Session Law 2017-74/H243), effective February 27, 2018, all controlled substances must be electronically prescribed. Calling prescriptions to the pharmacy will cease to exist.  Prescription refills: Only during scheduled appointments. Applies to all prescriptions.  NOTE: The following applies primarily to controlled substances (Opioid* Pain Medications).   Patient's responsibilities: 1. Pain Pills: Bring all pain pills to every appointment (except for procedure appointments). 2. Pill Bottles: Bring pills in original pharmacy bottle. Always bring the newest bottle. Bring bottle, even if empty. 3. Medication refills: You are responsible for knowing and keeping track of what medications you take and those you need refilled. The day before your appointment: write a list of all prescriptions that need to be refilled. The day of the appointment: give the list to the admitting nurse. Prescriptions will be written only during appointments. No prescriptions will be written on procedure days. If you forget a medication: it will not be "Called in", "Faxed", or "electronically sent". You will need to get another appointment to get these prescribed. No early refills. Do not call asking to have your prescription filled  early. 4. Prescription Accuracy: You are responsible for carefully inspecting your prescriptions before leaving our office. Have the discharge nurse carefully go over each prescription with you, before taking them home. Make sure that your name is accurately spelled, that your address is correct. Check the name and dose of your medication to make sure it is accurate. Check the number of pills, and the written instructions to make sure they are clear and accurate. Make sure that you are given enough medication to last until your next medication refill appointment. 5. Taking Medication: Take medication as prescribed. When it comes to controlled substances, taking less pills or less frequently than prescribed is permitted and encouraged. Never take more pills than instructed. Never take medication more frequently than prescribed.  6. Inform other Doctors: Always inform, all of your healthcare providers, of all the medications you take. 7. Pain Medication from other Providers: You are not allowed to accept any additional pain medication from any other Doctor or Healthcare provider. There are two exceptions to this rule. (see below) In the event that you require additional pain medication, you are responsible for notifying us, as stated below. 8. Medication Agreement: You are responsible for carefully reading and following our Medication Agreement. This must be signed before receiving any prescriptions from our practice. Safely store a copy of your signed Agreement. Violations to the Agreement will result in no further prescriptions. (Additional copies of our Medication Agreement are available upon request.) 9. Laws, Rules, & Regulations: All patients are expected to follow all Federal and State Laws, Statutes, Rules, & Regulations. Ignorance of the Laws does not constitute a valid excuse. The use of any illegal substances is prohibited. 10. Adopted CDC guidelines & recommendations: Target dosing levels will be  at or below 60 MME/day. Use of benzodiazepines** is not recommended.  Exceptions: There are only two exceptions to the rule of not   receiving pain medications from other Healthcare Providers. 1. Exception #1 (Emergencies): In the event of an emergency (i.e.: accident requiring emergency care), you are allowed to receive additional pain medication. However, you are responsible for: As soon as you are able, call our office [phone], at any time of the day or night, and leave a message stating your name, the date and nature of the emergency, and the name and dose of the medication prescribed. In the event that your call is answered by a member of our staff, make sure to document and save the date, time, and the name of the person that took your information.  2. Exception #2 (Planned Surgery): In the event that you are scheduled by another doctor or dentist to have any type of surgery or procedure, you are allowed (for a period no longer than 30 days), to receive additional pain medication, for the acute post-op pain. However, in this case, you are responsible for picking up a copy of our "Post-op Pain Management for Surgeons" handout, and giving it to your surgeon or dentist. This document is available at our office, and does not require an appointment to obtain it. Simply go to our office during business hours (Monday-Thursday from 8:00 AM to 4:00 PM) (Friday 8:00 AM to 12:00 Noon) or if you have a scheduled appointment with us, prior to your surgery, and ask for it by name. In addition, you will need to provide us with your name, name of your surgeon, type of surgery, and date of procedure or surgery.  *Opioid medications include: morphine, codeine, oxycodone, oxymorphone, hydrocodone, hydromorphone, meperidine, tramadol, tapentadol, buprenorphine, fentanyl, methadone. **Benzodiazepine medications include: diazepam (Valium), alprazolam (Xanax), clonazepam (Klonopine), lorazepam (Ativan), clorazepate  (Tranxene), chlordiazepoxide (Librium), estazolam (Prosom), oxazepam (Serax), temazepam (Restoril), triazolam (Halcion) (Last updated: 04/26/2017) ____________________________________________________________________________________________   ____________________________________________________________________________________________  Medication Recommendations and Reminders  Applies to: All patients receiving prescriptions (written and/or electronic).  Medication Rules & Regulations: These rules and regulations exist for your safety and that of others. They are not flexible and neither are we. Dismissing or ignoring them will be considered "non-compliance" with medication therapy, resulting in complete and irreversible termination of such therapy. (See document titled "Medication Rules" for more details.) In all conscience, because of safety reasons, we cannot continue providing a therapy where the patient does not follow instructions.  Pharmacy of record:   Definition: This is the pharmacy where your electronic prescriptions will be sent.   We do not endorse any particular pharmacy.  You are not restricted in your choice of pharmacy.  The pharmacy listed in the electronic medical record should be the one where you want electronic prescriptions to be sent.  If you choose to change pharmacy, simply notify our nursing staff of your choice of new pharmacy.  Recommendations:  Keep all of your pain medications in a safe place, under lock and key, even if you live alone.   After you fill your prescription, take 1 week's worth of pills and put them away in a safe place. You should keep a separate, properly labeled bottle for this purpose. The remainder should be kept in the original bottle. Use this as your primary supply, until it runs out. Once it's gone, then you know that you have 1 week's worth of medicine, and it is time to come in for a prescription refill. If you do this correctly, it  is unlikely that you will ever run out of medicine.  To make sure that the above recommendation works,   it is very important that you make sure your medication refill appointments are scheduled at least 1 week before you run out of medicine. To do this in an effective manner, make sure that you do not leave the office without scheduling your next medication management appointment. Always ask the nursing staff to show you in your prescription , when your medication will be running out. Then arrange for the receptionist to get you a return appointment, at least 7 days before you run out of medicine. Do not wait until you have 1 or 2 pills left, to come in. This is very poor planning and does not take into consideration that we may need to cancel appointments due to bad weather, sickness, or emergencies affecting our staff.  "Partial Fill": If for any reason your pharmacy does not have enough pills/tablets to completely fill or refill your prescription, do not allow for a "partial fill". You will need a separate prescription to fill the remaining amount, which we will not provide. If the reason for the partial fill is your insurance, you will need to talk to the pharmacist about payment alternatives for the remaining tablets, but again, do not accept a partial fill.  Prescription refills and/or changes in medication(s):   Prescription refills, and/or changes in dose or medication, will be conducted only during scheduled medication management appointments. (Applies to both, written and electronic prescriptions.)  No refills on procedure days. No medication will be changed or started on procedure days. No changes, adjustments, and/or refills will be conducted on a procedure day. Doing so will interfere with the diagnostic portion of the procedure.  No phone refills. No medications will be "called into the pharmacy".  No Fax refills.  No weekend refills.  No Holliday refills.  No after hours  refills.  Remember:  Business hours are:  Monday to Thursday 8:00 AM to 4:00 PM Provider's Schedule: Ryiah Bellissimo, MD - Appointments are:  Medication management: Monday and Wednesday 8:00 AM to 4:00 PM Procedure day: Tuesday and Thursday 7:30 AM to 4:00 PM Bilal Lateef, MD - Appointments are:  Medication management: Tuesday and Thursday 8:00 AM to 4:00 PM Procedure day: Monday and Wednesday 7:30 AM to 4:00 PM (Last update: 04/26/2017) ____________________________________________________________________________________________    

## 2018-11-19 ENCOUNTER — Other Ambulatory Visit
Admission: RE | Admit: 2018-11-19 | Discharge: 2018-11-19 | Disposition: A | Discharge status: Home / Self Care | Payer: Medicare Other | Source: Ambulatory Visit | Attending: Pain Medicine | Admitting: Pain Medicine

## 2018-11-19 ENCOUNTER — Ambulatory Visit
Admission: RE | Admit: 2018-11-19 | Discharge: 2018-11-19 | Disposition: A | Discharge status: Home / Self Care | Payer: Medicare Other | Source: Ambulatory Visit | Attending: Pain Medicine | Admitting: Pain Medicine

## 2018-11-19 ENCOUNTER — Other Ambulatory Visit: Payer: Self-pay

## 2018-11-19 DIAGNOSIS — G8929 Other chronic pain: Secondary | ICD-10-CM | POA: Diagnosis not present

## 2018-11-19 DIAGNOSIS — M7062 Trochanteric bursitis, left hip: Secondary | ICD-10-CM | POA: Diagnosis not present

## 2018-11-19 DIAGNOSIS — M25551 Pain in right hip: Secondary | ICD-10-CM | POA: Diagnosis not present

## 2018-11-19 DIAGNOSIS — Z471 Aftercare following joint replacement surgery: Secondary | ICD-10-CM | POA: Diagnosis not present

## 2018-11-19 DIAGNOSIS — Z96642 Presence of left artificial hip joint: Secondary | ICD-10-CM | POA: Insufficient documentation

## 2018-11-19 DIAGNOSIS — M25552 Pain in left hip: Secondary | ICD-10-CM | POA: Diagnosis not present

## 2018-11-19 LAB — URINE DRUG SCREEN, QUALITATIVE (ARMC ONLY)
Amphetamines, Ur Screen: NOT DETECTED
Barbiturates, Ur Screen: NOT DETECTED
Benzodiazepine, Ur Scrn: POSITIVE — AB
Cannabinoid 50 Ng, Ur ~~LOC~~: NOT DETECTED
Cocaine Metabolite,Ur ~~LOC~~: NOT DETECTED
MDMA (Ecstasy)Ur Screen: NOT DETECTED
Methadone Scn, Ur: NOT DETECTED
Opiate, Ur Screen: POSITIVE — AB
Phencyclidine (PCP) Ur S: NOT DETECTED
Tricyclic, Ur Screen: NOT DETECTED

## 2018-11-19 LAB — SEDIMENTATION RATE: Sed Rate: 28 mm/hr (ref 0–30)

## 2018-11-19 LAB — COMPREHENSIVE METABOLIC PANEL
ALT: 14 U/L (ref 0–44)
AST: 21 U/L (ref 15–41)
Albumin: 3.7 g/dL (ref 3.5–5.0)
Alkaline Phosphatase: 57 U/L (ref 38–126)
Anion gap: 10 (ref 5–15)
BUN: 18 mg/dL (ref 8–23)
CO2: 24 mmol/L (ref 22–32)
Calcium: 8.9 mg/dL (ref 8.9–10.3)
Chloride: 105 mmol/L (ref 98–111)
Creatinine, Ser: 1.14 mg/dL — ABNORMAL HIGH (ref 0.44–1.00)
GFR calc Af Amer: 53 mL/min — ABNORMAL LOW (ref >60)
GFR calc non Af Amer: 46 mL/min — ABNORMAL LOW (ref >60)
Glucose, Bld: 98 mg/dL (ref 70–99)
Potassium: 3.5 mmol/L (ref 3.5–5.1)
Sodium: 139 mmol/L (ref 135–145)
Total Bilirubin: 1.1 mg/dL (ref 0.3–1.2)
Total Protein: 6.6 g/dL (ref 6.5–8.1)

## 2018-11-19 LAB — C-REACTIVE PROTEIN: CRP: <0.8 mg/dL (ref <1.0)

## 2018-11-19 LAB — VITAMIN B12: Vitamin B-12: 3322 pg/mL — ABNORMAL HIGH (ref 180–914)

## 2018-11-19 LAB — MAGNESIUM: Magnesium: 1.5 mg/dL — ABNORMAL LOW (ref 1.7–2.4)

## 2018-11-20 LAB — VITAMIN D 25 HYDROXY (VIT D DEFICIENCY, FRACTURES): Vit D, 25-Hydroxy: 42.4 ng/mL (ref 30.0–100.0)

## 2018-11-27 ENCOUNTER — Encounter: Payer: Medicare Other | Admitting: Pain Medicine

## 2018-11-28 DIAGNOSIS — R799 Abnormal finding of blood chemistry, unspecified: Secondary | ICD-10-CM | POA: Diagnosis not present

## 2018-11-28 DIAGNOSIS — R5383 Other fatigue: Secondary | ICD-10-CM | POA: Diagnosis not present

## 2018-11-28 DIAGNOSIS — E568 Deficiency of other vitamins: Secondary | ICD-10-CM | POA: Diagnosis not present

## 2018-11-28 DIAGNOSIS — D510 Vitamin B12 deficiency anemia due to intrinsic factor deficiency: Secondary | ICD-10-CM | POA: Diagnosis not present

## 2018-11-28 DIAGNOSIS — D518 Other vitamin B12 deficiency anemias: Secondary | ICD-10-CM | POA: Diagnosis not present

## 2018-11-28 DIAGNOSIS — E8881 Metabolic syndrome: Secondary | ICD-10-CM | POA: Diagnosis not present

## 2018-11-28 DIAGNOSIS — E039 Hypothyroidism, unspecified: Secondary | ICD-10-CM | POA: Diagnosis not present

## 2018-12-12 ENCOUNTER — Encounter: Payer: Self-pay | Admitting: Pain Medicine

## 2018-12-12 ENCOUNTER — Telehealth: Payer: Self-pay | Admitting: Pain Medicine

## 2018-12-12 NOTE — Telephone Encounter (Signed)
Returning Nurse call for appt on 12-16-18

## 2018-12-15 NOTE — Progress Notes (Signed)
Pain Management Virtual Encounter Note - Virtual Visit via Telephone Telehealth (real-time audio visits between healthcare provider and patient).   Patient's Phone No. & Preferred Pharmacy:  [phone] (home); [phone] (mobile); (Preferred) [phone] [email or web address]  [facility] [id number] [place] [address] Phone: [phone] Fax: [phone]    Pre-screening note:  Our staff contacted Ms. Capece and offered her an "in person", "face-to-face" appointment versus a telephone encounter. She indicated preferring the telephone encounter, at this time.   Reason for Virtual Visit: COVID-19*  Social distancing based on CDC and AMA recommendations.   I contacted Karoline Garoutte on 12/16/2018 via telephone.      I clearly identified myself as Gaspar Cola, MD. I verified that I was speaking with the correct person using two identifiers (Name: Curtrina Suh, and date of birth: 1940-10-12).  Advanced Informed Consent I sought verbal advanced consent from [place] for virtual visit interactions. I informed Ms. Wamser of possible security and privacy concerns, risks, and limitations associated with providing "not-in-person" medical evaluation and management services. I also informed Ms. Capri of the availability of "in-person" appointments. Finally, I informed her that there would be a charge for the virtual visit and that she could be  personally, fully or partially, financially responsible for it. Ms. Pouliot expressed understanding and agreed to proceed.   Historic Elements   Ms. Denisa Lemaitre is a 78 y.o. year old, female patient evaluated today after her last encounter by our practice on 12/12/2018. Ms. Rakoczy  has a past medical history of Anxiety, Aortic insufficiency, Arthritis, Asthma, Bronchitis, Bursitis, hip, Claustrophobia, Claustrophobia, Dysrhythmia, GERD  (gastroesophageal reflux disease), Gout, Hyperlipidemia, Hypertension, Hypothyroidism, Mitral regurgitation, Obesity, Panic attacks, Paroxysmal atrial fibrillation ([place]), PTSD (post-traumatic stress disorder), Rhinitis, Sick sinus syndrome ([place]), and Sleep apnea. She also  has a past surgical history that includes Tonsillectomy; Pacemaker insertion; Status post child birth x1; Rotator cuff repair; Dilatation & curettage/hysteroscopy with myosure (N/A, 08/03/2015); Insert / replace / remove pacemaker; Total hip arthroplasty (Left, 12/22/2015); Cardiac catheterization; and Total hip revision (Left, 03/01/2018). Ms. Tees has a current medication list which includes the following prescription(s): allopurinol, apixaban, colchicine, diazepam, liothyronine, metoprolol tartrate, oxycodone, oxycodone, oxycodone, pregabalin, spironolactone, thyroid, and cyclobenzaprine. She  reports that she has never smoked. She has never used smokeless tobacco. She reports that she does not drink alcohol or use drugs. Ms. Chesebro is allergic to contrast media [iodinated diagnostic agents].   HPI  Today, she is being contacted for medication management.  Today's encounter is to evaluate the patient's Lyrica titration.  According to the patient's PMP, as of 12/16/2018, she has not filled the oxycodone prescription that I wrote on 11/18/2018 to be filled on 12/03/2018.  According to the same PMP her last oxycodone prescription was filled on 11/11/2018, which in theory should have lasted until 12/11/2018.  Today I asked the patient to give me a count asked to how many pills she has left at home.  Pharmacotherapy Assessment  Analgesic: Oxycodone IR 5 mg 1 tablet p.o. every 6 hours (20 mg/day of oxycodone) (has enough to last until 03/03/2019) MME/day:30mg /day.   Monitoring: Pharmacotherapy: No side-effects or adverse reactions reported. [place] PMP: PDMP reviewed during this encounter.       Compliance: No problems identified. Effectiveness:  Clinically acceptable. Plan: Refer to "POC".  UDS:  Summary  Date Value Ref Range Status  12/05/2016 FINAL  Final  Comment:    ==================================================================== TOXASSURE SELECT 13 (MW) ==================================================================== Test                             Result       Flag       Units Drug Present and Declared for Prescription Verification   Oxazepam                       30           EXPECTED   ng/mg creat   Temazepam                      17           EXPECTED   ng/mg creat    Oxazepam and temazepam are expected metabolites of diazepam.    Oxazepam is also an expected metabolite of other benzodiazepine    drugs, including chlordiazepoxide, prazepam, clorazepate,    halazepam, and temazepam.  Oxazepam and temazepam are available    as scheduled prescription medications.   Oxycodone                      567          EXPECTED   ng/mg creat   Oxymorphone                    148          EXPECTED   ng/mg creat   Noroxycodone                   1602         EXPECTED   ng/mg creat   Noroxymorphone                 2684         EXPECTED   ng/mg creat    Sources of oxycodone are scheduled prescription medications.    Oxymorphone, noroxycodone, and noroxymorphone are expected    metabolites of oxycodone. Oxymorphone is also available as a    scheduled prescription medication. ==================================================================== Test                      Result    Flag   Units      Ref Range   Creatinine              141              mg/dL      >=20 ==================================================================== Declared Medications:  The flagging and interpretation on this report are based on the  following declared medications.  Unexpected results may arise from  inaccuracies in the declared medications.  **Note: The testing scope of this panel includes these medications:  Diazepam (Valium)  Oxycodone  (Roxicodone)  **Note: The testing scope of this panel does not include following  reported medications:  Colchicine  Hydrochlorothiazide (Maxzide)  Levothyroxine  Metoprolol (Lopressor)  Supplement  Triamterene (Maxzide)  Warfarin ==================================================================== For clinical consultation, please call [phone]. ====================================================================    Laboratory Chemistry Profile (12 mo)  Renal: 11/19/2018: BUN 18; Creatinine, Ser 1.14  Lab Results  Component Value Date   GFR 53.86 (L) 11/07/2013   GFRAA 53 (L) 11/19/2018   GFRNONAA 46 (L) 11/19/2018   Hepatic: 11/19/2018: Albumin 3.7 Lab Results  Component Value Date   AST 21 11/19/2018   ALT 14  11/19/2018   Other: 11/19/2018: CRP <0.8; Sed Rate 28; Vit D, 25-Hydroxy 42.4; Vitamin B-12 3,322 Note: Above Lab results reviewed.  Imaging  Last 90 days:  Dg Hip Unilat W Or W/o Pelvis 2-3 Views Left  Result Date: 11/19/2018 CLINICAL DATA:  Bilateral hip pain for several months without known injury. EXAM: DG HIP (WITH OR WITHOUT PELVIS) 2-3V LEFT COMPARISON:  Radiographs of March 01, 2018. FINDINGS: Status post left total hip arthroplasty. The left acetabular and femoral components appear to be well situated. No fracture or dislocation is noted. IMPRESSION: Status post left total hip arthroplasty. No acute abnormality is noted. Electronically Signed   By: Marijo Conception M.D.   On: 11/19/2018 11:49   Dg Hip Unilat W Or W/o Pelvis 2-3 Views Right  Result Date: 11/19/2018 CLINICAL DATA:  Bilateral hip pain for several months without reported injury. EXAM: DG HIP (WITH OR WITHOUT PELVIS) 2-3V RIGHT COMPARISON:  None. FINDINGS: There is no evidence of hip fracture or dislocation. There is no evidence of arthropathy or other focal bone abnormality. IMPRESSION: Negative. Electronically Signed   By: Marijo Conception M.D.   On: 11/19/2018 11:48    Assessment  The  primary encounter diagnosis was Chronic pain syndrome. Diagnoses of Chronic hip pain (Primary Area of Pain) (Left), Neurogenic pain, Chronic musculoskeletal pain, Muscle spasm of left lower extremity, High serum vitamin B12, CKD stage G3a/A1, GFR 45-59 and albumin creatinine ratio <30 mg/g, and Hypomagnesemia were also pertinent to this visit.  Plan of Care  I have changed Heavin Sherburne's pregabalin. I am also having her start on cyclobenzaprine. Additionally, I am having her maintain her colchicine, diazepam, liothyronine, thyroid, allopurinol, spironolactone, apixaban, metoprolol tartrate, oxyCODONE, oxyCODONE, and oxyCODONE.  Pharmacotherapy (Medications Ordered): Meds ordered this encounter  Medications  . pregabalin (LYRICA) 25 MG capsule    Sig: Take 1 capsule (25 mg total) by mouth 2 (two) times daily.    Dispense:  60 capsule    Refill:  5    Fill one day early if pharmacy is closed on scheduled refill date. May substitute for generic if available.  Marland Kitchen oxyCODONE (OXY IR/ROXICODONE) 5 MG immediate release tablet    Sig: Take 1 tablet (5 mg total) by mouth every 6 (six) hours as needed for severe pain. Must last 30 days    Dispense:  120 tablet    Refill:  0    Chronic Pain: STOP Act (Not applicable) Fill 1 day early if closed on refill date. Do not fill until: 04/02/2019. To last until: 04/02/2019. Avoid benzodiazepines within 8 hours of opioids  . cyclobenzaprine (FLEXERIL) 5 MG tablet    Sig: Take 1 tablet (5 mg total) by mouth 2 (two) times daily as needed for muscle spasms.    Dispense:  60 tablet    Refill:  2    Fill one day early if pharmacy is closed on scheduled refill date. May substitute for generic if available.   Orders:  No orders of the defined types were placed in this encounter.  Follow-up plan:   Return in about 4 months (around 04/02/2019) for (VV), (MM).      Considering:   None at this time.    Palliative PRN treatment(s):   None at this time.      Recent  Visits Date Type Provider Dept  11/18/18 Office Visit Milinda Pointer, MD [facility]  Showing recent visits within past 90 days and meeting all other requirements   Today's  Visits Date Type Provider Dept  12/16/18 Office Visit Milinda Pointer, MD [facility]  Showing today's visits and meeting all other requirements   Future Appointments No visits were found meeting these conditions.  Showing future appointments within next 90 days and meeting all other requirements   I discussed the assessment and treatment plan with the patient. The patient was provided an opportunity to ask questions and all were answered. The patient agreed with the plan and demonstrated an understanding of the instructions.  Patient advised to call back or seek an in-person evaluation if the symptoms or condition worsens.  Total duration of non-face-to-face encounter: 15 minutes.  Note by: Gaspar Cola, MD Date: 12/16/2018; Time: 11:21 AM  Note: This dictation was prepared with Dragon dictation. Any transcriptional errors that may result from this process are unintentional.  Disclaimer:  * Given the special circumstances of the COVID-19 pandemic, the federal government has announced that the Office for Civil Rights (OCR) will exercise its enforcement discretion and will not impose penalties on physicians using telehealth in the event of noncompliance with regulatory requirements under the [place] (HIPAA) in connection with the good faith provision of telehealth during the XX123456 national public health emergency. ([place])

## 2018-12-16 ENCOUNTER — Ambulatory Visit: Payer: Medicare Other | Attending: Pain Medicine | Admitting: Pain Medicine

## 2018-12-16 ENCOUNTER — Other Ambulatory Visit: Payer: Self-pay

## 2018-12-16 DIAGNOSIS — G8929 Other chronic pain: Secondary | ICD-10-CM | POA: Diagnosis not present

## 2018-12-16 DIAGNOSIS — M7918 Myalgia, other site: Secondary | ICD-10-CM | POA: Diagnosis not present

## 2018-12-16 DIAGNOSIS — M792 Neuralgia and neuritis, unspecified: Secondary | ICD-10-CM

## 2018-12-16 DIAGNOSIS — N1831 Chronic kidney disease, stage 3a: Secondary | ICD-10-CM | POA: Diagnosis not present

## 2018-12-16 DIAGNOSIS — R7989 Other specified abnormal findings of blood chemistry: Secondary | ICD-10-CM | POA: Diagnosis not present

## 2018-12-16 DIAGNOSIS — M62838 Other muscle spasm: Secondary | ICD-10-CM

## 2018-12-16 DIAGNOSIS — M25552 Pain in left hip: Secondary | ICD-10-CM | POA: Diagnosis not present

## 2018-12-16 DIAGNOSIS — G894 Chronic pain syndrome: Secondary | ICD-10-CM

## 2018-12-16 MED ORDER — OXYCODONE HCL 5 MG PO TABS: 5.0000 mg | ORAL_TABLET | Freq: Four times a day (QID) | ORAL | 0 refills | Status: DC | PRN

## 2018-12-16 MED ORDER — CYCLOBENZAPRINE HCL 5 MG PO TABS: 5.0000 mg | ORAL_TABLET | Freq: Two times a day (BID) | ORAL | 2 refills | Status: DC | PRN

## 2018-12-16 MED ORDER — PREGABALIN 25 MG PO CAPS: 25.0000 mg | ORAL_CAPSULE | Freq: Two times a day (BID) | ORAL | 5 refills | Status: DC

## 2018-12-16 NOTE — Progress Notes (Signed)
Normal Vitamin B-12 level(s): are between 180 and 914 pg/mL.  Elevated Vit B-12 level(s): Levels above 914 pg/mL. Possible causes: Taking supplements of vitamin B-12 (cobalamin). Medical conditions that can increase levels of vitamin B12 include: liver disease, kidney failure and myeloproliferative disorders, which includes myelocytic leukemia and polycythemia vera. Recommendations: Stop vitamin B-12 supplements. Contact primary care physician for further evaluation and recommendations. 

## 2018-12-16 NOTE — Progress Notes (Signed)
Normal Magnesium level(s): between 1.7 and 2.4 mEq/L. Low Magnesium Level(s): Levels below 1.7 mEq/L. (Known as "hypomagnesemia") Clinical significance: Low magnesium blood level can lead to low calcium and potassium levels. Low levels may indicate inadequate dietary consuming, poor absorbtion, or excessive excretion. It can lead to low potasium  Signs and symptoms may include: Deficiency of magnesium can cause tiredness, generalized weakness, muscle cramps, abnormal heart rhythms, increased irritability of the nervous system with tremors, paresthesias, palpitations, hypokalemia, hypoparathyroidism which might result in hypocalcemia, chondrocalcinosis, spasticity and tetany, epileptic seizures, basal ganglia calcifications and in extreme and prolonged cases coma, intellectual disability or death.[3] Other symptoms that have been suggested to be associated with hypomagnesemia are athetosis, jerking, nystagmus, and an extensor plantar reflex, confusion, disorientation, hallucinations, depression, hypertension and fast heart rate. Hospitalized patients being treated on an intensive care unit who have a low magnesium level may have a higher risk of respiratory failure, and death. Possible causes: Causes include alcoholism, starvation, diarrhea, increased urinary loss, and poor absorption from the intestines. - Medications: Loop and thiazide diuretics. Certain antibiotics. Heartburn medicines such as omeprazole. Etc. - Medical: Genetic mutations. Gastrointestinal diseases. Malabsorption, acute pancreatitis, fluoride poisoning, etc.  Recommendations: - Contact primary care physician for further evaluation and recommendations. - Consider taking over-the-counter supplements. Consult your primary care physician first. 

## 2018-12-16 NOTE — Patient Instructions (Signed)
____________________________________________________________________________________________  Muscle Spasms & Cramps  Cause:  The most common cause of muscle spasms and cramps is vitamin and/or electrolyte (calcium, potassium, sodium, etc.) deficiencies.  Possible triggers: Sweating - causes loss of electrolytes thru the skin. Steroids - causes loss of electrolytes thru the urine.  Treatment: 1. Gatorade (or any other electrolyte-replenishing drink) - Take 1, 8 oz glass with each meal (3 times a day). 2. OTC (over-the-counter) Magnesium 400 to 500 mg - Take 1 tablet twice a day (one with breakfast and one before bedtime). If you have kidney problems, talk to your primary care physician before taking any Magnesium. 3. Tonic Water with quinine - Take 1, 8 oz glass before bedtime.   ____________________________________________________________________________________________    

## 2018-12-16 NOTE — Progress Notes (Signed)
-  Normal Creatinine levels are between 0.5 and 0.9 mg/dl for our lab. Any condition that impairs the function of the kidneys is likely to raise the creatinine level in the blood. The most common causes of longstanding (chronic) kidney disease in adults are high blood pressure and diabetes. Other causes of elevated blood creatinine levels include drugs, ingestion of a large amount of dietary meat, kidney infections, rhabdomyolysis (abnormal muscle breakdown), and urinary tract obstruction. ___________________________________________________________________________________ eGFR (Estimated Glomerular Filtration Rate) results are reported as milliliters/minute/1.13m (mL/min/1.720m. Because some laboratories do not collect information on a patient's race when the sample is collected for testing, they may report calculated results for both African Americans and non-African Americans.  The NaNation[facility] Mutual InsuranceNB[facility] Dallassuggests only reporting actual results once values are < 60 mL/min. 1. Normal values: 90-120 mL/min 2. Below 60 mL/min suggests that some kidney damage has occurred. 3. Between 5945nd 30 indicate (Moderate) Stage 3 kidney disease. 4. Between 29 and 15 represent (Severe) Stage 4 kidney disease. 5. Less than 15 is considered (Kidney Failure) Stage 5. ___________________________________________________________________________________

## 2018-12-30 ENCOUNTER — Telehealth: Payer: Self-pay | Admitting: Internal Medicine

## 2018-12-30 DIAGNOSIS — I4821 Permanent atrial fibrillation: Secondary | ICD-10-CM

## 2018-12-30 DIAGNOSIS — Z950 Presence of cardiac pacemaker: Secondary | ICD-10-CM

## 2018-12-30 DIAGNOSIS — R001 Bradycardia, unspecified: Secondary | ICD-10-CM

## 2018-12-30 NOTE — Telephone Encounter (Signed)
I spoke with the patient about her Eliquis. She states she has been paying $33/ month for the last 2 months, but as of this month it will cost her $435.70/ month.  I have advised her she is now in the donut hole. She is aware that she will most likely face the same issue with Pradaxa/ Xarelto.  I have offered the following 2 options: 1) apply for patient assistance for Eliquis or 2) stop eliquis and restart warfarin  Per the patient, she would prefer to stay on eliquis. I have advised her I will pull a couple of boxes of samples for her of eliquis. She currently has 3-4 days worth left on this medication. She is aware to come to the office and pick up samples along with the patient assistance application for eliquis. She is aware to come in the [place] entrance and then proceed to our front desk.  I have advised her once she completes the patient portion of the application to return it to our office for [place] to complete our part and submit.  The patient voices understanding of the above and is agreeable.   Samples given: Eliquis 5 mg Lot: [id number] Exp: 01/2021 # 2 boxes given

## 2018-12-30 NOTE — Telephone Encounter (Signed)
Please call with alternative for Eliquis, pt spouse states it is too expensive.

## 2019-01-06 NOTE — Telephone Encounter (Signed)
Patient dropped off Eliquis Patient Assistance Forms to be completed  Placed in nurse box

## 2019-01-13 DIAGNOSIS — M79675 Pain in left toe(s): Secondary | ICD-10-CM | POA: Diagnosis not present

## 2019-01-13 DIAGNOSIS — B351 Tinea unguium: Secondary | ICD-10-CM | POA: Diagnosis not present

## 2019-01-13 DIAGNOSIS — M79674 Pain in right toe(s): Secondary | ICD-10-CM | POA: Diagnosis not present

## 2019-01-13 MED ORDER — APIXABAN 5 MG PO TABS: 5.0000 mg | ORAL_TABLET | Freq: Two times a day (BID) | ORAL | 3 refills | Status: DC

## 2019-01-13 NOTE — Addendum Note (Signed)
Addended by: Alvis Lemmings C on: 01/13/2019 04:52 PM   Modules accepted: Orders

## 2019-01-15 ENCOUNTER — Telehealth: Payer: Self-pay | Admitting: Internal Medicine

## 2019-01-15 NOTE — Telephone Encounter (Signed)
[name] making patient aware that Eliquis Samples are placed up front. She will need to come through the [place] entrance. She will be screened with COVID questions and someone will show her how to get down to our office.    Drug name: Eliquis     Strength: 5MG       Qty: 4 BOXES  LOT: [id number]  Exp.Date: 12/22  [name] 9:56 AM 01/15/2019

## 2019-01-15 NOTE — Telephone Encounter (Signed)
Patient calling the office for samples of medication:   1.  What medication and dosage are you requesting samples for? Eliquis 5 mg po BID   2.  Are you currently out of this medication? Last dose will be Friday   Previous note states patient assistance forms have been started .

## 2019-01-16 ENCOUNTER — Encounter: Payer: Self-pay | Admitting: *Deleted

## 2019-01-16 NOTE — Telephone Encounter (Addendum)
Completed patient assistance form for Eliquis faxed to [place] at [phone]. Confirmation received.   All paperwork placed in the filing cabinet for patient assistance.

## 2019-02-11 ENCOUNTER — Other Ambulatory Visit: Payer: Self-pay | Admitting: Internal Medicine

## 2019-02-11 DIAGNOSIS — R001 Bradycardia, unspecified: Secondary | ICD-10-CM

## 2019-02-11 DIAGNOSIS — I4821 Permanent atrial fibrillation: Secondary | ICD-10-CM

## 2019-02-11 DIAGNOSIS — Z950 Presence of cardiac pacemaker: Secondary | ICD-10-CM

## 2019-02-11 MED ORDER — APIXABAN 5 MG PO TABS: 5.0000 mg | ORAL_TABLET | Freq: Two times a day (BID) | ORAL | 3 refills | Status: DC

## 2019-02-11 MED ORDER — APIXABAN 5 MG PO TABS: 5.0000 mg | ORAL_TABLET | Freq: Two times a day (BID) | ORAL | 1 refills | Status: DC

## 2019-02-11 NOTE — Telephone Encounter (Signed)
Refill Request.  

## 2019-02-11 NOTE — Addendum Note (Signed)
Addended by: Allean Found on: 02/11/2019 12:07 PM   Modules accepted: Orders

## 2019-02-11 NOTE — Telephone Encounter (Signed)
Patient calling the office for samples of medication:   1.  What medication and dosage are you requesting samples for? Eliquis 5mg  bid  2.  Are you currently out of this medication? Will be out on 02/14/2019  If no samples are available patient will need Warfarin refilled at [facility] in [facility] [address] by Kristopher Oppenheim)  Please advise

## 2019-02-11 NOTE — Telephone Encounter (Signed)
36f 90kg Scr 1.14 11/19/18 Lovw/gollan 09/11/18

## 2019-02-12 ENCOUNTER — Telehealth: Payer: Self-pay | Admitting: Internal Medicine

## 2019-02-12 NOTE — Telephone Encounter (Signed)
Do you mind calling her to see how much eliquis she has left. Once the new year hits she should come out of the donut hole.  We may have enough samples to get her through.   Thanks!

## 2019-02-12 NOTE — Telephone Encounter (Signed)
Patient has been denied assistance for Eliquis per Clovis Fredrickson with [place].  Please review any alternatives for the patient. The patient states, she can't afford the Eliquis with being in the donut hole and is paying $435.70 a month.

## 2019-02-12 NOTE — Telephone Encounter (Signed)
Medication Samples have been provided to the patient.  Drug name: Eliquis       Strength: 5mg         Qty: 2 boxes-#28  [id number]  Exp.Date: Jan 2023 Dosing instructions: one tablet PO BID  Rene Paci McClain 2:48 PM 02/12/2019

## 2019-02-12 NOTE — Telephone Encounter (Addendum)
Patient notified we can give her enough samples of Eliquis to get her through the first of the year and hopefully by then, she will be out of the donut hole. The patient is aware if she is not out of the donut hole at that time, we can discuss a alternative medication.   Additional Sample Box given to the patient.  Eliquis 5 mg  Medication Samples have been provided to the patient.  Drug name: Eliquis      Strength: 5 mg        Qty: 1 Box  LOT: [id number] Exp.Date: Jan. 2023  Dolores Lory 3:38 PM 02/12/2019

## 2019-02-12 NOTE — Telephone Encounter (Signed)
Patient calling the office for samples of medication:   1.  What medication and dosage are you requesting samples for? Eliquis  5 mg po BID   2.  Are you currently out of this medication? Yes on Friday   Patient cannot afford rx and has not heard anything about medication assistance approval.  Please call asap patient is nervous about what is going to happen

## 2019-03-03 ENCOUNTER — Telehealth: Payer: Self-pay

## 2019-03-03 NOTE — Telephone Encounter (Signed)
Patient notified samples provided and placed at the front desk. The patient is aware she will report to the medical mall due to Covid.  Medication Samples have been provided to the patient.  Drug name: Eliquis  Strength: 5 mg     Qty: 3 Boxes  LOT: [id number]  Exp.Date: 02/2021   Dolores Lory 2:03 PM 03/03/2019

## 2019-03-03 NOTE — Telephone Encounter (Signed)
Patient would like eliquis samples.p

## 2019-03-11 ENCOUNTER — Encounter: Payer: Medicare Other | Admitting: Internal Medicine

## 2019-03-11 ENCOUNTER — Other Ambulatory Visit: Payer: Self-pay

## 2019-03-11 MED ORDER — APIXABAN 5 MG PO TABS: 5.0000 mg | ORAL_TABLET | Freq: Two times a day (BID) | ORAL | 1 refills | Status: DC

## 2019-03-11 NOTE — Telephone Encounter (Signed)
*  STAT* If patient is at the pharmacy, call can be transferred to refill team.   1. Which medications need to be refilled? (please list name of each medication and dose if known) Eliquis  2. Which pharmacy/location (including street and city if local pharmacy) is medication to be sent to?[facility]  3. Do they need a 30 day or 90 day supply? 90  

## 2019-03-11 NOTE — Telephone Encounter (Signed)
Prescription refill request for Eliquis received.  Last office visit:  [name] 09/10/2018 Scr: 1.14, 11/19/2018 Age: 79 y.o. Weight: 90 kg  Prescription refill sent.

## 2019-03-25 ENCOUNTER — Ambulatory Visit: Payer: Medicare Other | Admitting: Cardiovascular Disease

## 2019-03-26 ENCOUNTER — Encounter: Payer: Self-pay | Admitting: Pain Medicine

## 2019-03-30 NOTE — Progress Notes (Signed)
Patient: [name]  Service Category: E/M  Provider: Gaspar Cola, MD  DOB: [birth year]  DOS: 03/31/2019  Location: Office  MRN: [id number]  Setting: Ambulatory outpatient  Referring Provider: Bryson Corona, NP  Type: Established Patient  Specialty: Interventional Pain Management  PCP: Bryson Corona, NP  Location: Remote location  Delivery: TeleHealth     Virtual Encounter - Pain Management PROVIDER NOTE: Information contained herein reflects review and annotations entered in association with encounter. Interpretation of such information and data should be left to medically-trained personnel. Information provided to patient can be located elsewhere in the medical record under "Patient Instructions". Document created using STT-dictation technology, any transcriptional errors that may result from process are unintentional.    Contact & Pharmacy Preferred: [phone] Home: [phone] (home) Mobile: [phone] (mobile) E-mail: [email or web address] [id number], [place] [address] [place] [address] Phone: [phone] Fax: [phone]  [facility] [place], [place] [address] [place] [address] Phone: [phone] Fax: [phone]   Pre-screening  Ms. Udell offered "in-person" vs "virtual" encounter. She indicated preferring virtual for this encounter.   Reason COVID-19*  Social distancing based on CDC and AMA recommendations.   I contacted Melanny Burkman on 03/31/2019 via telephone.      I clearly identified myself as Gaspar Cola, MD. I verified that I was speaking with the correct person using two identifiers (Name: Cleveland Wonser, and date of birth: [birth year]).  Consent I sought verbal advanced consent from [name] for virtual visit interactions. I informed Ms. Bothun of possible security and privacy concerns,  risks, and limitations associated with providing "not-in-person" medical evaluation and management services. I also informed Ms. Oakland of the availability of "in-person" appointments. Finally, I informed her that there would be a charge for the virtual visit and that she could be  personally, fully or partially, financially responsible for it. Ms. Mowry expressed understanding and agreed to proceed.   Historic Elements   Ms. Alahna Terzian is a 79 y.o. year old, female patient evaluated today after her last encounter by our practice on 12/16/2018. Ms. Molloy  has a past medical history of Anxiety, Aortic insufficiency, Arthritis, Asthma, Bronchitis, Bursitis, hip, Claustrophobia, Claustrophobia, Dysrhythmia, GERD (gastroesophageal reflux disease), Gout, Hyperlipidemia, Hypertension, Hypothyroidism, Mitral regurgitation, Obesity, Panic attacks, Paroxysmal atrial fibrillation ([place]), PTSD (post-traumatic stress disorder), Rhinitis, Sick sinus syndrome ([place]), and Sleep apnea. She also  has a past surgical history that includes Tonsillectomy; Pacemaker insertion; Status post child birth x1; Rotator cuff repair; Dilatation & curettage/hysteroscopy with myosure (N/A, 08/03/2015); Insert / replace / remove pacemaker; Total hip arthroplasty (Left, 12/22/2015); Cardiac catheterization; and Total hip revision (Left, 03/01/2018). Ms. Essenmacher has a current medication list which includes the following prescription(s): allopurinol, apixaban, colchicine, [START ON 06/14/2019] cyclobenzaprine, liothyronine, lorazepam, metoprolol tartrate, [START ON 04/02/2019] oxycodone, [START ON 05/02/2019] oxycodone, [START ON 06/01/2019] oxycodone, [START ON 06/14/2019] pregabalin, spironolactone, and thyroid. She  reports that she has never smoked. She has never used smokeless tobacco. She reports that she does not drink alcohol or use drugs. Ms. Carte is allergic to contrast media [iodinated diagnostic agents].   HPI  Today, she is being contacted for  medication management.  The patient indicates doing well with the current medication regimen. No adverse reactions or side effects reported to the medications.  Today the patient indicates having discontinued the  diazepam, which is great, but unfortunately she was switched to lorazepam 1 mg every 8 hours.  I asked the patient if she actually needed to take that much and she indicated that she was thinking about breaking the pill in fourth and taking only one fourth of a pill at a time.  I told the patient that rather than doing that, to consider talking to her prescribing physician to see if they could prescribe the 0.5 mg pill and that way she can break that one in half and only take 0.25 mg at a time.  The patient has been warned about the interaction between opioids and benzodiazepines and how it can lead to respiratory depression and death.  The patient was recommended not to take the pills within 8 hours of each other.  However, because she has the prescription of the lorazepam to be taken every 8 hours, this makes it rather difficult to accomplish.  I much rather have the patient not take any benzodiazepine, if at all possible.  However, if the patient is properly evaluated by a psychiatrist who determines that it is medically indicated, then I would not have any problems with having the psychiatrist prescribe it and monitoring it.   Pharmacotherapy Assessment  Analgesic: Oxycodone IR 5 mg 1 tablet p.o. every 6 hours (20 mg/day of oxycodone) (has enough to last until 03/03/2019) MME/day:30mg /day.   Monitoring: Pharmacotherapy: No side-effects or adverse reactions reported. [place] PMP: PDMP reviewed during this encounter.       Compliance: No problems identified. Effectiveness: Clinically acceptable. Plan: Refer to "POC".  UDS:  Summary  Date Value Ref Range Status  12/05/2016 FINAL  Final    Comment:    ==================================================================== TOXASSURE SELECT 13  (MW) ==================================================================== Test                             Result       Flag       Units Drug Present and Declared for Prescription Verification   Oxazepam                       30           EXPECTED   ng/mg creat   Temazepam                      17           EXPECTED   ng/mg creat    Oxazepam and temazepam are expected metabolites of diazepam.    Oxazepam is also an expected metabolite of other benzodiazepine    drugs, including chlordiazepoxide, prazepam, clorazepate,    halazepam, and temazepam.  Oxazepam and temazepam are available    as scheduled prescription medications.   Oxycodone                      567          EXPECTED   ng/mg creat   Oxymorphone                    148          EXPECTED   ng/mg creat   Noroxycodone                   1602         EXPECTED   ng/mg creat   Noroxymorphone  2684         EXPECTED   ng/mg creat    Sources of oxycodone are scheduled prescription medications.    Oxymorphone, noroxycodone, and noroxymorphone are expected    metabolites of oxycodone. Oxymorphone is also available as a    scheduled prescription medication. ==================================================================== Test                      Result    Flag   Units      Ref Range   Creatinine              141              mg/dL      >=20 ==================================================================== Declared Medications:  The flagging and interpretation on this report are based on the  following declared medications.  Unexpected results may arise from  inaccuracies in the declared medications.  **Note: The testing scope of this panel includes these medications:  Diazepam (Valium)  Oxycodone (Roxicodone)  **Note: The testing scope of this panel does not include following  reported medications:  Colchicine  Hydrochlorothiazide (Maxzide)  Levothyroxine  Metoprolol (Lopressor)  Supplement  Triamterene  (Maxzide)  Warfarin ==================================================================== For clinical consultation, please call [phone]. ====================================================================    Laboratory Chemistry Profile (12 mo)  Renal: 11/19/2018: BUN 18; Creatinine, Ser 1.14  Lab Results  Component Value Date   GFR 53.86 (L) 11/07/2013   GFRAA 53 (L) 11/19/2018   GFRNONAA 46 (L) 11/19/2018   Hepatic: 11/19/2018: Albumin 3.7 Lab Results  Component Value Date   AST 21 11/19/2018   ALT 14 11/19/2018   Other: 11/19/2018: CRP <0.8; Sed Rate 28; Vit D, 25-Hydroxy 42.4; Vitamin B-12 3,322  Note: Above Lab results reviewed.  Imaging  DG HIP UNILAT W OR W/O PELVIS 2-3 VIEWS LEFT CLINICAL DATA:  Bilateral hip pain for several months without known injury.  EXAM: DG HIP (WITH OR WITHOUT PELVIS) 2-3V LEFT  COMPARISON:  Radiographs of March 01, 2018.  FINDINGS: Status post left total hip arthroplasty. The left acetabular and femoral components appear to be well situated. No fracture or dislocation is noted.  IMPRESSION: Status post left total hip arthroplasty. No acute abnormality is noted.  Electronically Signed   By: Marijo Conception M.D.   On: 11/19/2018 11:49 DG HIP UNILAT W OR W/O PELVIS 2-3 VIEWS RIGHT CLINICAL DATA:  Bilateral hip pain for several months without reported injury.  EXAM: DG HIP (WITH OR WITHOUT PELVIS) 2-3V RIGHT  COMPARISON:  None.  FINDINGS: There is no evidence of hip fracture or dislocation. There is no evidence of arthropathy or other focal bone abnormality.  IMPRESSION: Negative.  Electronically Signed   By: Marijo Conception M.D.   On: 11/19/2018 11:48   Assessment  Diagnoses of Chronic pain syndrome, Chronic musculoskeletal pain, Muscle spasm of left lower extremity, and Neurogenic pain were pertinent to this visit.  Plan of Care  Problem-specific:  No problem-specific Assessment & Plan notes found for this  encounter.  I have discontinued Kamesha Kliebert's diazepam. I am also having her start on oxyCODONE and oxyCODONE. Additionally, I am having her maintain her colchicine, liothyronine, thyroid, allopurinol, spironolactone, metoprolol tartrate, apixaban, oxyCODONE, cyclobenzaprine, pregabalin, and LORazepam.  Pharmacotherapy (Medications Ordered): Meds ordered this encounter  Medications  . oxyCODONE (OXY IR/ROXICODONE) 5 MG immediate release tablet    Sig: Take 1 tablet (5 mg total) by mouth every 6 (six) hours as needed for severe pain. Must last 30  days    Dispense:  120 tablet    Refill:  0    Chronic Pain: STOP Act (Not applicable) Fill 1 day early if closed on refill date. Do not fill until: 04/02/2019. To last until: 05/02/2019. Avoid benzodiazepines within 8 hours of opioids  . oxyCODONE (OXY IR/ROXICODONE) 5 MG immediate release tablet    Sig: Take 1 tablet (5 mg total) by mouth every 6 (six) hours as needed for severe pain. Must last 30 days    Dispense:  120 tablet    Refill:  0    Chronic Pain: STOP Act (Not applicable) Fill 1 day early if closed on refill date. Do not fill until: 05/02/2019. To last until: 06/01/2019. Avoid benzodiazepines within 8 hours of opioids  . oxyCODONE (OXY IR/ROXICODONE) 5 MG immediate release tablet    Sig: Take 1 tablet (5 mg total) by mouth every 6 (six) hours as needed for severe pain. Must last 30 days    Dispense:  120 tablet    Refill:  0    Chronic Pain: STOP Act (Not applicable) Fill 1 day early if closed on refill date. Do not fill until: 06/01/2019. To last until: 07/01/2019. Avoid benzodiazepines within 8 hours of opioids  . cyclobenzaprine (FLEXERIL) 5 MG tablet    Sig: Take 1 tablet (5 mg total) by mouth 2 (two) times daily as needed for muscle spasms.    Dispense:  60 tablet    Refill:  5    Fill one day early if pharmacy is closed on scheduled refill date. May substitute for generic if available.  . pregabalin (LYRICA) 25 MG capsule    Sig: Take 1  capsule (25 mg total) by mouth 2 (two) times daily.    Dispense:  60 capsule    Refill:  5    Fill one day early if pharmacy is closed on scheduled refill date. May substitute for generic if available.   Orders:  No orders of the defined types were placed in this encounter.  Follow-up plan:   Return in about 13 weeks (around 06/30/2019) for (VV), (MM).      Considering:   None at this time.    Palliative PRN treatment(s):   None at this time.       Recent Visits No visits were found meeting these conditions.  Showing recent visits within past 90 days and meeting all other requirements   Today's Visits Date Type Provider Dept  03/31/19 Telemedicine Milinda Pointer, MD [facility]  Showing today's visits and meeting all other requirements   Future Appointments No visits were found meeting these conditions.  Showing future appointments within next 90 days and meeting all other requirements   I discussed the assessment and treatment plan with the patient. The patient was provided an opportunity to ask questions and all were answered. The patient agreed with the plan and demonstrated an understanding of the instructions.  Patient advised to call back or seek an in-person evaluation if the symptoms or condition worsens.  Duration of encounter: 13 minutes.  Note by: Gaspar Cola, MD Date: 03/31/2019; Time: 11:10 AM

## 2019-03-31 ENCOUNTER — Other Ambulatory Visit: Payer: Self-pay

## 2019-03-31 ENCOUNTER — Ambulatory Visit: Payer: Medicare Other | Attending: Pain Medicine | Admitting: Pain Medicine

## 2019-03-31 DIAGNOSIS — M62838 Other muscle spasm: Secondary | ICD-10-CM

## 2019-03-31 DIAGNOSIS — M7918 Myalgia, other site: Secondary | ICD-10-CM | POA: Diagnosis not present

## 2019-03-31 DIAGNOSIS — M792 Neuralgia and neuritis, unspecified: Secondary | ICD-10-CM | POA: Diagnosis not present

## 2019-03-31 DIAGNOSIS — M25552 Pain in left hip: Secondary | ICD-10-CM

## 2019-03-31 DIAGNOSIS — G8929 Other chronic pain: Secondary | ICD-10-CM

## 2019-03-31 DIAGNOSIS — G894 Chronic pain syndrome: Secondary | ICD-10-CM

## 2019-03-31 MED ORDER — OXYCODONE HCL 5 MG PO TABS: 5.0000 mg | ORAL_TABLET | Freq: Four times a day (QID) | ORAL | 0 refills | Status: DC | PRN

## 2019-03-31 MED ORDER — CYCLOBENZAPRINE HCL 5 MG PO TABS: 5.0000 mg | ORAL_TABLET | Freq: Two times a day (BID) | ORAL | 5 refills | Status: DC | PRN

## 2019-03-31 MED ORDER — PREGABALIN 25 MG PO CAPS: 25.0000 mg | ORAL_CAPSULE | Freq: Two times a day (BID) | ORAL | 5 refills | Status: DC

## 2019-04-22 ENCOUNTER — Other Ambulatory Visit: Payer: Self-pay

## 2019-04-22 ENCOUNTER — Encounter: Payer: Self-pay | Admitting: *Deleted

## 2019-04-22 ENCOUNTER — Encounter: Payer: Self-pay | Admitting: Internal Medicine

## 2019-04-22 ENCOUNTER — Ambulatory Visit (INDEPENDENT_AMBULATORY_CARE_PROVIDER_SITE_OTHER): Payer: Medicare Other | Admitting: Internal Medicine

## 2019-04-22 VITALS — BP 140/80 | HR 73 | Ht 61.0 in | Wt 199.8 lb

## 2019-04-22 DIAGNOSIS — I4821 Permanent atrial fibrillation: Secondary | ICD-10-CM

## 2019-04-22 DIAGNOSIS — Z01812 Encounter for preprocedural laboratory examination: Secondary | ICD-10-CM

## 2019-04-22 DIAGNOSIS — I442 Atrioventricular block, complete: Secondary | ICD-10-CM

## 2019-04-22 DIAGNOSIS — Z950 Presence of cardiac pacemaker: Secondary | ICD-10-CM

## 2019-04-22 NOTE — Progress Notes (Signed)
Patient Care Team: Briles, Jodelle Green, NP as PCP - General (Family Medicine) Fay Records, MD as PCP - Cardiology (Cardiology)   HPI  [name] is a 79 y.o. female Seen in follow-up for pacemaker implanted 2010 by Dr. Elliot Cousin for bradycardia in the context of permanent atrial fibrillation.  Anticoagulated with apixoban << warfarin ( 9/20)    Now at [facility] with residual battery voltage.    Date Cr K Hgb  8/19 1.1  12.1  1/20 1.05 4.1 14.9  9/20 1.14 3.5          The patient denies chest pain,   nocturnal dyspnea, orthopnea or peripheral edema.  There have been no palpitations, lightheadedness or syncope.  Chronic dypnea  So glad she is now on noac, no bleeding    Thromboembolic risk factors ( age  -2, HTN-1, Gender-1) for a CHADSVASc Score of >=4   Records and Results Reviewed   Past Medical History:  Diagnosis Date  . Anxiety   . Aortic insufficiency    Trivial  . Arthritis   . Asthma   . Bronchitis   . Bursitis, hip   . Claustrophobia   . Claustrophobia   . Dysrhythmia    a fib  . GERD (gastroesophageal reflux disease)    history of  . Gout   . Hyperlipidemia   . Hypertension   . Hypothyroidism   . Mitral regurgitation   . Obesity   . Panic attacks   . Paroxysmal atrial fibrillation (HCC)   . PTSD (post-traumatic stress disorder)   . Rhinitis   . Sick sinus syndrome ([place])   . Sleep apnea    was ordered CPAP, but never fitted for one years ago, Dr has retired    Past Surgical History:  Procedure Laterality Date  . CARDIAC CATHETERIZATION    . DILATATION & CURETTAGE/HYSTEROSCOPY WITH MYOSURE N/A 08/03/2015   Procedure: DILATATION & CURETTAGE/HYSTEROSCOPY WITH MYOSURE;  Surgeon: Alden Hipp, MD;  Location: [facility];  Service: Gynecology;  Laterality: N/A;  Patient has an ICD   . INSERT / REPLACE / REMOVE PACEMAKER    . PACEMAKER INSERTION     Status post insertion  . ROTATOR CUFF REPAIR     left  . Status post child birth x1    . TONSILLECTOMY      . TOTAL HIP ARTHROPLASTY Left 12/22/2015   Procedure: TOTAL HIP ARTHROPLASTY;  Surgeon: Dereck Leep, MD;  Location: [facility];  Service: Orthopedics;  Laterality: Left;  . TOTAL HIP REVISION Left 03/01/2018   Procedure: TOTAL HIP REVISION;  Surgeon: Dereck Leep, MD;  Location: [facility];  Service: Orthopedics;  Laterality: Left;    Current Meds  Medication Sig  . allopurinol (ZYLOPRIM) 100 MG tablet Take 2 tablets by mouth daily.   Marland Kitchen apixaban (ELIQUIS) 5 MG TABS tablet Take 1 tablet (5 mg total) by mouth 2 (two) times daily.  Derrill Memo ON 06/14/2019] cyclobenzaprine (FLEXERIL) 5 MG tablet Take 1 tablet (5 mg total) by mouth 2 (two) times daily as needed for muscle spasms.  Marland Kitchen liothyronine (CYTOMEL) 5 MCG tablet Take 15 mcg by mouth daily.   Marland Kitchen LORazepam (ATIVAN) 1 MG tablet Take 1 mg by mouth every 8 (eight) hours. Patient states she is not taking it at this time  . metoprolol tartrate (LOPRESSOR) 50 MG tablet Take 1.5 tablets (75 mg total) by mouth 2 (two) times daily.  Marland Kitchen oxyCODONE (OXY IR/ROXICODONE) 5 MG immediate release tablet  Take 1 tablet (5 mg total) by mouth every 6 (six) hours as needed for severe pain. Must last 30 days  . [START ON 05/02/2019] oxyCODONE (OXY IR/ROXICODONE) 5 MG immediate release tablet Take 1 tablet (5 mg total) by mouth every 6 (six) hours as needed for severe pain. Must last 30 days  . spironolactone (ALDACTONE) 100 MG tablet Take 100 mg by mouth daily.  Marland Kitchen thyroid (ARMOUR) 60 MG tablet Take 60 mg by mouth daily before breakfast.     Allergies  Allergen Reactions  . Contrast Media [Iodinated Diagnostic Agents] Rash    Contrast dye causes swelling      Review of Systems negative except from HPI and PMH  Physical Exam BP 140/80 (BP Location: Left Arm, Patient Position: Sitting, Cuff Size: Large)   Pulse 73   Ht 5\' 1"  (1.549 m)   Wt 199 lb 12 oz (90.6 kg)   BMI 37.74 kg/m  Well developed and Morbidly obese  in no acute distress HENT normal Neck supple  with JVP-flat Clear Device pocket well healed; without hematoma or erythema.  There is no tethering  Regular rate and rhythm, no murmur Abd-soft with active BS No Clubbing cyanosis  edema Skin-warm and dry A & Oriented  Grossly normal sensory and motor function  ECG atrial fib@ 73 -09/49    Assessment and  Plan  Atrial fibrillation-permanent  Bradycardia symptomatic  High Risk Medication Surveillance Spironolactone   Pacemaker St Jude now @  ERI      On Anticoagulation;  No bleeding issues   No palps with afib  Euvolemic continue current meds  BP borderline  Will follow  Device @ ERI  We have reviewed the benefits and risks of generator replacement.  These include but are not limited to lead fracture and infection.  The patient understands, agrees and is willing to proceed.

## 2019-04-22 NOTE — H&P (View-Only) (Signed)
Patient Care Team: Briles, Jodelle Green, NP as PCP - General (Family Medicine) Fay Records, MD as PCP - Cardiology (Cardiology)   HPI  [name] is a 79 y.o. female Seen in follow-up for pacemaker implanted 2010 by Dr. Elliot Cousin for bradycardia in the context of permanent atrial fibrillation.  Anticoagulated with apixoban << warfarin ( 9/20)    Now at [facility] with residual battery voltage.    Date Cr K Hgb  8/19 1.1  12.1  1/20 1.05 4.1 14.9  9/20 1.14 3.5          The patient denies chest pain,   nocturnal dyspnea, orthopnea or peripheral edema.  There have been no palpitations, lightheadedness or syncope.  Chronic dypnea  So glad she is now on noac, no bleeding    Thromboembolic risk factors ( age  -2, HTN-1, Gender-1) for a CHADSVASc Score of >=4   Records and Results Reviewed   Past Medical History:  Diagnosis Date  . Anxiety   . Aortic insufficiency    Trivial  . Arthritis   . Asthma   . Bronchitis   . Bursitis, hip   . Claustrophobia   . Claustrophobia   . Dysrhythmia    a fib  . GERD (gastroesophageal reflux disease)    history of  . Gout   . Hyperlipidemia   . Hypertension   . Hypothyroidism   . Mitral regurgitation   . Obesity   . Panic attacks   . Paroxysmal atrial fibrillation (HCC)   . PTSD (post-traumatic stress disorder)   . Rhinitis   . Sick sinus syndrome ([place])   . Sleep apnea    was ordered CPAP, but never fitted for one years ago, Dr has retired    Past Surgical History:  Procedure Laterality Date  . CARDIAC CATHETERIZATION    . DILATATION & CURETTAGE/HYSTEROSCOPY WITH MYOSURE N/A 08/03/2015   Procedure: DILATATION & CURETTAGE/HYSTEROSCOPY WITH MYOSURE;  Surgeon: Alden Hipp, MD;  Location: [facility];  Service: Gynecology;  Laterality: N/A;  Patient has an ICD   . INSERT / REPLACE / REMOVE PACEMAKER    . PACEMAKER INSERTION     Status post insertion  . ROTATOR CUFF REPAIR     left  . Status post child birth x1    . TONSILLECTOMY      . TOTAL HIP ARTHROPLASTY Left 12/22/2015   Procedure: TOTAL HIP ARTHROPLASTY;  Surgeon: Dereck Leep, MD;  Location: [facility];  Service: Orthopedics;  Laterality: Left;  . TOTAL HIP REVISION Left 03/01/2018   Procedure: TOTAL HIP REVISION;  Surgeon: Dereck Leep, MD;  Location: [facility];  Service: Orthopedics;  Laterality: Left;    Current Meds  Medication Sig  . allopurinol (ZYLOPRIM) 100 MG tablet Take 2 tablets by mouth daily.   Marland Kitchen apixaban (ELIQUIS) 5 MG TABS tablet Take 1 tablet (5 mg total) by mouth 2 (two) times daily.  Derrill Memo ON 06/14/2019] cyclobenzaprine (FLEXERIL) 5 MG tablet Take 1 tablet (5 mg total) by mouth 2 (two) times daily as needed for muscle spasms.  Marland Kitchen liothyronine (CYTOMEL) 5 MCG tablet Take 15 mcg by mouth daily.   Marland Kitchen LORazepam (ATIVAN) 1 MG tablet Take 1 mg by mouth every 8 (eight) hours. Patient states she is not taking it at this time  . metoprolol tartrate (LOPRESSOR) 50 MG tablet Take 1.5 tablets (75 mg total) by mouth 2 (two) times daily.  Marland Kitchen oxyCODONE (OXY IR/ROXICODONE) 5 MG immediate release tablet  Take 1 tablet (5 mg total) by mouth every 6 (six) hours as needed for severe pain. Must last 30 days  . [START ON 05/02/2019] oxyCODONE (OXY IR/ROXICODONE) 5 MG immediate release tablet Take 1 tablet (5 mg total) by mouth every 6 (six) hours as needed for severe pain. Must last 30 days  . spironolactone (ALDACTONE) 100 MG tablet Take 100 mg by mouth daily.  Marland Kitchen thyroid (ARMOUR) 60 MG tablet Take 60 mg by mouth daily before breakfast.     Allergies  Allergen Reactions  . Contrast Media [Iodinated Diagnostic Agents] Rash    Contrast dye causes swelling      Review of Systems negative except from HPI and PMH  Physical Exam BP 140/80 (BP Location: Left Arm, Patient Position: Sitting, Cuff Size: Large)   Pulse 73   Ht 5\' 1"  (1.549 m)   Wt 199 lb 12 oz (90.6 kg)   BMI 37.74 kg/m  Well developed and Morbidly obese  in no acute distress HENT normal Neck supple  with JVP-flat Clear Device pocket well healed; without hematoma or erythema.  There is no tethering  Regular rate and rhythm, no murmur Abd-soft with active BS No Clubbing cyanosis  edema Skin-warm and dry A & Oriented  Grossly normal sensory and motor function  ECG atrial fib@ 73 -09/49    Assessment and  Plan  Atrial fibrillation-permanent  Bradycardia symptomatic  High Risk Medication Surveillance Spironolactone   Pacemaker St Jude now @  ERI      On Anticoagulation;  No bleeding issues   No palps with afib  Euvolemic continue current meds  BP borderline  Will follow  Device @ ERI  We have reviewed the benefits and risks of generator replacement.  These include but are not limited to lead fracture and infection.  The patient understands, agrees and is willing to proceed.

## 2019-04-22 NOTE — Patient Instructions (Signed)
Medication Instructions:  - Your physician recommends that you continue on your current medications as directed. Please refer to the Current Medication list given to you today.  *If you need a refill on your cardiac medications before your next appointment, please call your pharmacy*  Lab Work: - Your physician recommends that you have lab work today: BMP/ CBC  - Pre-procedure COVID swab: Wednesday 04/30/19 (12:30 pm- 2:30 pm) - [facility], drive up testing only   If you have labs (blood work) drawn today and your tests are completely normal, you will receive your results only by: Marland Kitchen MyChart Message (if you have MyChart) OR . A paper copy in the mail If you have any lab test that is abnormal or we need to change your treatment, we will call you to review the results.  Testing/Procedures: - Your physician has recommended that you have a pacemaker generator (battery) change out.   Follow-Up: At [facility], you and your health needs are our priority.  As part of our continuing mission to provide you with exceptional heart care, we have created designated Provider Care Teams.  These Care Teams include your primary Cardiologist (physician) and Advanced Practice Providers (APPs -  Physician Assistants and Nurse Practitioners) who all work together to provide you with the care you need, when you need it.  Your next appointment:   - 10- 14 days (from 05/02/19) with the [facility]) for a wound check - 91 days (from 05/02/19) with Dr. Caryl Comes   The format for your next appointment:   In Person  Provider:   as above  Other Instructions n/a

## 2019-04-23 LAB — CUP PACEART INCLINIC DEVICE CHECK
Battery Impedance: 31600 Ohm
Battery Remaining Longevity: 0 mo
Battery Voltage: 2.59 V
Brady Statistic RV Percent Paced: 26 %
Date Time Interrogation Session: 20210223130027
Implantable Lead Implant Date: 20090317
Implantable Lead Implant Date: 20090317
Implantable Lead Location: 753859
Implantable Lead Location: 753860
Implantable Pulse Generator Implant Date: 20090317
Lead Channel Impedance Value: 551 Ohm
Lead Channel Pacing Threshold Amplitude: 0.5 V
Lead Channel Pacing Threshold Pulse Width: 0.4 ms
Lead Channel Sensing Intrinsic Amplitude: 12 mV
Lead Channel Setting Pacing Amplitude: 2.5 V
Lead Channel Setting Pacing Pulse Width: 0.4 ms
Lead Channel Setting Sensing Sensitivity: 2 mV
Pulse Gen Model: 5826
Pulse Gen Serial Number: 2056081

## 2019-04-23 LAB — CBC WITH DIFFERENTIAL/PLATELET
Basophils Absolute: 0 10*3/uL (ref 0.0–0.2)
Basos: 1 %
EOS (ABSOLUTE): 0.1 10*3/uL (ref 0.0–0.4)
Eos: 2 %
Hematocrit: 41 % (ref 34.0–46.6)
Hemoglobin: 14.3 g/dL (ref 11.1–15.9)
Immature Grans (Abs): 0 10*3/uL (ref 0.0–0.1)
Immature Granulocytes: 0 %
Lymphocytes Absolute: 1.4 10*3/uL (ref 0.7–3.1)
Lymphs: 20 %
MCH: 32.7 pg (ref 26.6–33.0)
MCHC: 34.9 g/dL (ref 31.5–35.7)
MCV: 94 fL (ref 79–97)
Monocytes Absolute: 0.4 10*3/uL (ref 0.1–0.9)
Monocytes: 6 %
Neutrophils Absolute: 5.3 10*3/uL (ref 1.4–7.0)
Neutrophils: 71 %
Platelets: 169 10*3/uL (ref 150–450)
RBC: 4.37 x10E6/uL (ref 3.77–5.28)
RDW: 13.6 % (ref 11.7–15.4)
WBC: 7.4 10*3/uL (ref 3.4–10.8)

## 2019-04-23 LAB — BASIC METABOLIC PANEL
BUN/Creatinine Ratio: 18 (ref 12–28)
BUN: 20 mg/dL (ref 8–27)
CO2: 22 mmol/L (ref 20–29)
Calcium: 9.7 mg/dL (ref 8.7–10.3)
Chloride: 102 mmol/L (ref 96–106)
Creatinine, Ser: 1.11 mg/dL — ABNORMAL HIGH (ref 0.57–1.00)
GFR calc Af Amer: 55 mL/min/{1.73_m2} — ABNORMAL LOW (ref >59)
GFR calc non Af Amer: 47 mL/min/{1.73_m2} — ABNORMAL LOW (ref >59)
Glucose: 114 mg/dL — ABNORMAL HIGH (ref 65–99)
Potassium: 4.4 mmol/L (ref 3.5–5.2)
Sodium: 140 mmol/L (ref 134–144)

## 2019-04-30 ENCOUNTER — Other Ambulatory Visit: Payer: Self-pay

## 2019-04-30 ENCOUNTER — Other Ambulatory Visit
Admission: RE | Admit: 2019-04-30 | Discharge: 2019-04-30 | Disposition: A | Discharge status: Home / Self Care | Payer: Medicare Other | Source: Ambulatory Visit | Attending: Internal Medicine | Admitting: Internal Medicine

## 2019-04-30 DIAGNOSIS — Z01812 Encounter for preprocedural laboratory examination: Secondary | ICD-10-CM | POA: Diagnosis not present

## 2019-04-30 DIAGNOSIS — Z20822 Contact with and (suspected) exposure to covid-19: Secondary | ICD-10-CM | POA: Insufficient documentation

## 2019-04-30 LAB — SARS CORONAVIRUS 2 (TAT 6-24 HRS): SARS Coronavirus 2: NEGATIVE

## 2019-05-01 NOTE — Progress Notes (Signed)
Attempted to call patient to go over instructions for tomorrow's procedure.     Left voice mail to arrive at 0930, nothing to eat or drink, no meds AM of procedure and to wash with special soap tonight and in the morning.Will need responsible person to drive you home.

## 2019-05-02 ENCOUNTER — Other Ambulatory Visit: Payer: Self-pay

## 2019-05-02 ENCOUNTER — Ambulatory Visit (HOSPITAL_COMMUNITY)
Admission: RE | Disposition: A | Discharge status: Home / Self Care | Payer: Medicare Other | Source: Home / Self Care | Attending: Internal Medicine

## 2019-05-02 ENCOUNTER — Ambulatory Visit (HOSPITAL_COMMUNITY)
Admission: RE | Admit: 2019-05-02 | Discharge: 2019-05-02 | Disposition: A | Discharge status: Home / Self Care | Payer: Medicare Other | Attending: Internal Medicine | Admitting: Internal Medicine

## 2019-05-02 DIAGNOSIS — F4310 Post-traumatic stress disorder, unspecified: Secondary | ICD-10-CM | POA: Diagnosis not present

## 2019-05-02 DIAGNOSIS — Z6836 Body mass index (BMI) 36.0-36.9, adult: Secondary | ICD-10-CM | POA: Insufficient documentation

## 2019-05-02 DIAGNOSIS — Z7901 Long term (current) use of anticoagulants: Secondary | ICD-10-CM | POA: Insufficient documentation

## 2019-05-02 DIAGNOSIS — I10 Essential (primary) hypertension: Secondary | ICD-10-CM | POA: Insufficient documentation

## 2019-05-02 DIAGNOSIS — M109 Gout, unspecified: Secondary | ICD-10-CM | POA: Diagnosis not present

## 2019-05-02 DIAGNOSIS — R001 Bradycardia, unspecified: Secondary | ICD-10-CM | POA: Diagnosis not present

## 2019-05-02 DIAGNOSIS — F419 Anxiety disorder, unspecified: Secondary | ICD-10-CM | POA: Insufficient documentation

## 2019-05-02 DIAGNOSIS — G4730 Sleep apnea, unspecified: Secondary | ICD-10-CM | POA: Insufficient documentation

## 2019-05-02 DIAGNOSIS — Z79890 Hormone replacement therapy (postmenopausal): Secondary | ICD-10-CM | POA: Insufficient documentation

## 2019-05-02 DIAGNOSIS — Z79899 Other long term (current) drug therapy: Secondary | ICD-10-CM | POA: Diagnosis not present

## 2019-05-02 DIAGNOSIS — K219 Gastro-esophageal reflux disease without esophagitis: Secondary | ICD-10-CM | POA: Diagnosis not present

## 2019-05-02 DIAGNOSIS — E039 Hypothyroidism, unspecified: Secondary | ICD-10-CM | POA: Diagnosis not present

## 2019-05-02 DIAGNOSIS — J45909 Unspecified asthma, uncomplicated: Secondary | ICD-10-CM | POA: Diagnosis not present

## 2019-05-02 DIAGNOSIS — I4821 Permanent atrial fibrillation: Secondary | ICD-10-CM | POA: Diagnosis not present

## 2019-05-02 DIAGNOSIS — E785 Hyperlipidemia, unspecified: Secondary | ICD-10-CM | POA: Insufficient documentation

## 2019-05-02 DIAGNOSIS — Z45010 Encounter for checking and testing of cardiac pacemaker pulse generator [battery]: Secondary | ICD-10-CM | POA: Insufficient documentation

## 2019-05-02 DIAGNOSIS — I495 Sick sinus syndrome: Secondary | ICD-10-CM | POA: Insufficient documentation

## 2019-05-02 DIAGNOSIS — I340 Nonrheumatic mitral (valve) insufficiency: Secondary | ICD-10-CM | POA: Diagnosis not present

## 2019-05-02 DIAGNOSIS — E669 Obesity, unspecified: Secondary | ICD-10-CM | POA: Diagnosis not present

## 2019-05-02 HISTORY — PX: PPM GENERATOR CHANGEOUT: EP1233

## 2019-05-02 SURGERY — PPM GENERATOR CHANGEOUT
Anesthesia: LOCAL

## 2019-05-02 MED ORDER — METOPROLOL TARTRATE 50 MG PO TABS: 75.0000 mg | ORAL_TABLET | Freq: Two times a day (BID) | ORAL | Status: DC

## 2019-05-02 MED ORDER — MIDAZOLAM HCL 5 MG/5ML IJ SOLN
INTRAMUSCULAR | Status: DC | PRN
  Administered 2019-05-02 (×2): 1 mg via INTRAVENOUS

## 2019-05-02 MED ORDER — SODIUM CHLORIDE 0.9 % IV SOLN
INTRAVENOUS | Status: AC
  Filled 2019-05-02: qty 2

## 2019-05-02 MED ORDER — OXYCODONE HCL 5 MG PO TABS: 5.0000 mg | ORAL_TABLET | Freq: Four times a day (QID) | ORAL | Status: DC | PRN

## 2019-05-02 MED ORDER — FENTANYL CITRATE (PF) 100 MCG/2ML IJ SOLN
INTRAMUSCULAR | Status: AC
  Filled 2019-05-02: qty 2

## 2019-05-02 MED ORDER — CEFAZOLIN SODIUM-DEXTROSE 2-4 GM/100ML-% IV SOLN
2.0000 g | INTRAVENOUS | Status: AC
  Administered 2019-05-02: 2 g via INTRAVENOUS

## 2019-05-02 MED ORDER — THYROID 60 MG PO TABS: 60.0000 mg | ORAL_TABLET | Freq: Every day | ORAL | Status: DC

## 2019-05-02 MED ORDER — MIDAZOLAM HCL 5 MG/5ML IJ SOLN
INTRAMUSCULAR | Status: AC
  Filled 2019-05-02: qty 5

## 2019-05-02 MED ORDER — ACETAMINOPHEN 500 MG PO TABS: 1000.0000 mg | ORAL_TABLET | Freq: Four times a day (QID) | ORAL | Status: DC | PRN

## 2019-05-02 MED ORDER — LIOTHYRONINE SODIUM 5 MCG PO TABS: 15.0000 ug | ORAL_TABLET | Freq: Every day | ORAL | Status: DC

## 2019-05-02 MED ORDER — SODIUM CHLORIDE 0.9 % IV SOLN: INTRAVENOUS | Status: DC

## 2019-05-02 MED ORDER — PREGABALIN 25 MG PO CAPS: 25.0000 mg | ORAL_CAPSULE | Freq: Two times a day (BID) | ORAL | Status: DC

## 2019-05-02 MED ORDER — FENTANYL CITRATE (PF) 100 MCG/2ML IJ SOLN
INTRAMUSCULAR | Status: DC | PRN
  Administered 2019-05-02: 12.5 ug via INTRAVENOUS
  Administered 2019-05-02: 25 ug via INTRAVENOUS

## 2019-05-02 MED ORDER — LORAZEPAM 1 MG PO TABS: 1.0000 mg | ORAL_TABLET | Freq: Every day | ORAL | Status: DC | PRN

## 2019-05-02 MED ORDER — SODIUM CHLORIDE 0.9 % IV SOLN
80.0000 mg | INTRAVENOUS | Status: AC
  Administered 2019-05-02: 13:00:00 80 mg

## 2019-05-02 MED ORDER — ALLOPURINOL 100 MG PO TABS: 100.0000 mg | ORAL_TABLET | Freq: Two times a day (BID) | ORAL | Status: DC

## 2019-05-02 MED ORDER — APIXABAN 5 MG PO TABS: 5.0000 mg | ORAL_TABLET | Freq: Two times a day (BID) | ORAL | Status: DC

## 2019-05-02 MED ORDER — LIDOCAINE HCL 1 % IJ SOLN
INTRAMUSCULAR | Status: AC
  Filled 2019-05-02: qty 60

## 2019-05-02 MED ORDER — LIDOCAINE HCL (PF) 1 % IJ SOLN
INTRAMUSCULAR | Status: DC | PRN
  Administered 2019-05-02: 50 mL

## 2019-05-02 MED ORDER — CYCLOBENZAPRINE HCL 5 MG PO TABS: 5.0000 mg | ORAL_TABLET | Freq: Two times a day (BID) | ORAL | Status: DC | PRN

## 2019-05-02 MED ORDER — CEFAZOLIN SODIUM-DEXTROSE 2-4 GM/100ML-% IV SOLN
INTRAVENOUS | Status: AC
  Filled 2019-05-02: qty 100

## 2019-05-02 MED ORDER — ACETAMINOPHEN 325 MG PO TABS: 325.0000 mg | ORAL_TABLET | ORAL | Status: DC | PRN

## 2019-05-02 MED ORDER — SPIRONOLACTONE 100 MG PO TABS: 100.0000 mg | ORAL_TABLET | Freq: Every day | ORAL | Status: DC

## 2019-05-02 MED ORDER — ONDANSETRON HCL 4 MG/2ML IJ SOLN: 4.0000 mg | Freq: Four times a day (QID) | INTRAMUSCULAR | Status: DC | PRN

## 2019-05-02 SURGICAL SUPPLY — 5 items
CABLE SURGICAL S-101-97-12 (CABLE) ×2 IMPLANT
HEMOSTAT SURGICEL 2X4 FIBR (HEMOSTASIS) ×2 IMPLANT
PACEMAKER ASSURITY DR-RF (Pacemaker) ×2 IMPLANT
PAD PRO RADIOLUCENT 2001M-C (PAD) ×2 IMPLANT
TRAY PACEMAKER INSERTION (PACKS) ×2 IMPLANT

## 2019-05-02 NOTE — Interval H&P Note (Signed)
History and Physical Interval Note:  05/02/2019 12:56 PM  [name]  has presented today for surgery, with the diagnosis of pacemaker ERI.  The various methods of treatment have been discussed with the patient and family. After consideration of risks, benefits and other options for treatment, the patient has consented to  Procedure(s): PPM GENERATOR CHANGEOUT (N/A) as a surgical intervention.  The patient's history has been reviewed, patient examined, no change in status, stable for surgery.  I have reviewed the patient's chart and labs.  Questions were answered to the patient's satisfaction.     Virl Axe

## 2019-05-02 NOTE — Discharge Instructions (Signed)
Keep wound dry until Monday. Outer dressing can come off tomorrow 3/6. Steri strips will be removed at wound appt. No driving for 4 days. Any swelling at site, call office.  Pacemaker Battery Change, Care After This sheet gives you information about how to care for yourself after your procedure. Your health care provider may also give you more specific instructions. If you have problems or questions, contact your health care provider. What can I expect after the procedure? After your procedure, it is common to have:  Pain or soreness at the site where the pacemaker was inserted.  Swelling at the site where the pacemaker was inserted. Follow these instructions at home: Incision care   Keep the incision clean and dry. ? Do not take baths, swim, or use a hot tub until your health care provider approves. ? You may shower the day after your procedure, or as directed by your health care provider. ? Pat the area dry with a clean towel. Do not rub the area. This may cause bleeding.  Follow instructions from your health care provider about how to take care of your incision. Make sure you: ? Wash your hands with soap and water before you change your bandage (dressing). If soap and water are not available, use hand sanitizer. ? Change your dressing as told by your health care provider. ? Leave stitches (sutures), skin glue, or adhesive strips in place. These skin closures may need to stay in place for 2 weeks or longer. If adhesive strip edges start to loosen and curl up, you may trim the loose edges. Do not remove adhesive strips completely unless your health care provider tells you to do that.  Check your incision area every day for signs of infection. Check for: ? More redness, swelling, or pain. ? More fluid or blood. ? Warmth. ? Pus or a bad smell. Activity  Do not lift anything that is heavier than 10 lb (4.5 kg) until your health care provider says it is okay to do so.  For the first 2  weeks, or as long as told by your health care provider: ? Avoid lifting your left arm higher than your shoulder. ? Be gentle when you move your arms over your head. It is okay to raise your arm to comb your hair. ? Avoid strenuous exercise.  Ask your health care provider when it is okay to: ? Resume your normal activities. ? Return to work or school. ? Resume sexual activity. Eating and drinking  Eat a heart-healthy diet. This should include plenty of fresh fruits and vegetables, whole grains, low-fat dairy products, and lean protein like chicken and fish.  Limit alcohol intake to no more than 1 drink a day for non-pregnant women and 2 drinks a day for men. One drink equals 12 oz of beer, 5 oz of wine, or 1 oz of hard liquor.  Check ingredients and nutrition facts on packaged foods and beverages. Avoid the following types of food: ? Food that is high in salt (sodium). ? Food that is high in saturated fat, like full-fat dairy or red meat. ? Food that is high in trans fat, like fried food. ? Food and drinks that are high in sugar. Lifestyle  Do not use any products that contain nicotine or tobacco, such as cigarettes and e-cigarettes. If you need help quitting, ask your health care provider.  Take steps to manage and control your weight.  Get regular exercise. Aim for 150 minutes of moderate-intensity exercise (such  as walking or yoga) or 75 minutes of vigorous exercise (such as running or swimming) each week.  Manage other health problems, such as diabetes or high blood pressure. Ask your health care provider how you can manage these conditions. General instructions  Do not drive for 24 hours after your procedure if you were given a medicine to help you relax (sedative).  Take over-the-counter and prescription medicines only as told by your health care provider.  Avoid putting pressure on the area where the pacemaker was placed.  If you need an MRI after your pacemaker has been  placed, be sure to tell the health care provider who orders the MRI that you have a pacemaker.  Avoid close and prolonged exposure to electrical devices that have strong magnetic fields. These include: ? Cell phones. Avoid keeping them in a pocket near the pacemaker, and try using the ear opposite the pacemaker. ? MP3 players. ? Household appliances, like microwaves. ? Metal detectors. ? Electric generators. ? High-tension wires.  Keep all follow-up visits as directed by your health care provider. This is important. Contact a health care provider if:  You have pain at the incision site that is not relieved by over-the-counter or prescription medicines.  You have any of these around your incision site or coming from it: ? More redness, swelling, or pain. ? Fluid or blood. ? Warmth to the touch. ? Pus or a bad smell.  You have a fever.  You feel brief, occasional palpitations, light-headedness, or any symptoms that you think might be related to your heart. Get help right away if:  You experience chest pain that is different from the pain at the pacemaker site.  You develop a red streak that extends above or below the incision site.  You experience shortness of breath.  You have palpitations or an irregular heartbeat.  You have light-headedness that does not go away quickly.  You faint or have dizzy spells.  Your pulse suddenly drops or increases rapidly and does not return to normal.  You begin to gain weight and your legs and ankles swell. Summary  After your procedure, it is common to have pain, soreness, and some swelling where the pacemaker was inserted.  Make sure to keep your incision clean and dry. Follow instructions from your health care provider about how to take care of your incision.  Check your incision every day for signs of infection, such as more pain or swelling, pus or a bad smell, warmth, or leaking fluid and blood.  Avoid strenuous exercise and  lifting your left arm higher than your shoulder for 2 weeks, or as long as told by your health care provider. This information is not intended to replace advice given to you by your health care provider. Make sure you discuss any questions you have with your health care provider. Document Revised: 01/26/2017 Document Reviewed: 01/06/2016 Elsevier Patient Education  2020 [employer].

## 2019-05-05 MED FILL — Lidocaine HCl Local Inj 1%: INTRAMUSCULAR | Qty: 60 | Status: AC

## 2019-05-13 ENCOUNTER — Other Ambulatory Visit: Payer: Self-pay

## 2019-05-13 ENCOUNTER — Ambulatory Visit (INDEPENDENT_AMBULATORY_CARE_PROVIDER_SITE_OTHER): Payer: Medicare Other | Admitting: *Deleted

## 2019-05-13 DIAGNOSIS — Z950 Presence of cardiac pacemaker: Secondary | ICD-10-CM | POA: Diagnosis not present

## 2019-05-13 DIAGNOSIS — I495 Sick sinus syndrome: Secondary | ICD-10-CM

## 2019-05-13 LAB — CUP PACEART INCLINIC DEVICE CHECK
Battery Remaining Longevity: 145 mo
Battery Voltage: 3.11 V
Brady Statistic RA Percent Paced: 0 %
Brady Statistic RV Percent Paced: 11 %
Date Time Interrogation Session: 20210316105500
Implantable Lead Implant Date: 20090317
Implantable Lead Implant Date: 20090317
Implantable Lead Location: 753859
Implantable Lead Location: 753860
Implantable Pulse Generator Implant Date: 20210305
Lead Channel Impedance Value: 462.5 Ohm
Lead Channel Pacing Threshold Amplitude: 0.5 V
Lead Channel Pacing Threshold Pulse Width: 0.4 ms
Lead Channel Sensing Intrinsic Amplitude: 1.5 mV
Lead Channel Sensing Intrinsic Amplitude: 12 mV
Lead Channel Setting Pacing Amplitude: 2.5 V
Lead Channel Setting Pacing Pulse Width: 0.4 ms
Lead Channel Setting Sensing Sensitivity: 2 mV
Pulse Gen Model: 2272
Pulse Gen Serial Number: 3802091

## 2019-05-13 NOTE — Progress Notes (Signed)
Pacemaker check and wound check in clinic. Steri-strips removed, wound edges approximated , no redness, edema, or drainage present at wound site. Normal device function. Thresholds, sensing, impedances consistent with previous measurements. Device programmed to maximize longevity. No mode switch or high ventricular rates noted. Device programmed at appropriate safety margins. Histogram distribution appropriate for patient activity level. Device programmed to optimize intrinsic conduction. Estimated longevity 12 yrs 1 month. Follow up with Dr Caryl Comes 07/29/19. Patient enrolled in remote follow-up and next remote scheduled for 07/30/19 and every 3 months after.. Patient education completed.

## 2019-05-13 NOTE — Patient Instructions (Addendum)
Call office if you have any redness, drainage, or swelling at the wound site. [phone].

## 2019-05-15 ENCOUNTER — Ambulatory Visit: Payer: Medicare Other | Admitting: Cardiovascular Disease

## 2019-05-21 ENCOUNTER — Other Ambulatory Visit: Payer: Self-pay

## 2019-05-21 ENCOUNTER — Encounter: Payer: Self-pay | Admitting: Physician Assistant

## 2019-05-21 ENCOUNTER — Ambulatory Visit (INDEPENDENT_AMBULATORY_CARE_PROVIDER_SITE_OTHER): Payer: Medicare Other | Admitting: Physician Assistant

## 2019-05-21 VITALS — BP 130/80 | HR 85 | Ht 61.0 in | Wt 199.0 lb

## 2019-05-21 DIAGNOSIS — I495 Sick sinus syndrome: Secondary | ICD-10-CM

## 2019-05-21 DIAGNOSIS — I482 Chronic atrial fibrillation, unspecified: Secondary | ICD-10-CM | POA: Diagnosis not present

## 2019-05-21 DIAGNOSIS — E785 Hyperlipidemia, unspecified: Secondary | ICD-10-CM

## 2019-05-21 DIAGNOSIS — R5383 Other fatigue: Secondary | ICD-10-CM | POA: Diagnosis not present

## 2019-05-21 DIAGNOSIS — Z6837 Body mass index (BMI) 37.0-37.9, adult: Secondary | ICD-10-CM | POA: Diagnosis not present

## 2019-05-21 DIAGNOSIS — Z950 Presence of cardiac pacemaker: Secondary | ICD-10-CM

## 2019-05-21 DIAGNOSIS — E039 Hypothyroidism, unspecified: Secondary | ICD-10-CM

## 2019-05-21 DIAGNOSIS — I10 Essential (primary) hypertension: Secondary | ICD-10-CM | POA: Diagnosis not present

## 2019-05-21 DIAGNOSIS — I4821 Permanent atrial fibrillation: Secondary | ICD-10-CM | POA: Diagnosis not present

## 2019-05-21 MED ORDER — METOPROLOL TARTRATE 50 MG PO TABS: 75.0000 mg | ORAL_TABLET | Freq: Two times a day (BID) | ORAL | 3 refills | Status: DC

## 2019-05-21 NOTE — Patient Instructions (Signed)
Medication Instructions:  - Your physician recommends that you continue on your current medications as directed. Please refer to the Current Medication list given to you today.  - see attached information on Zetia (ezetimibe) tablets  *If you need a refill on your cardiac medications before your next appointment, please call your pharmacy*   Lab Work: - none ordered  If you have labs (blood work) drawn today and your tests are completely normal, you will receive your results only by: Marland Kitchen MyChart Message (if you have MyChart) OR . A paper copy in the mail If you have any lab test that is abnormal or we need to change your treatment, we will call you to review the results.   Testing/Procedures: - none ordered   Follow-Up: At [facility], you and your health needs are our priority.  As part of our continuing mission to provide you with exceptional heart care, we have created designated Provider Care Teams.  These Care Teams include your primary Cardiologist (physician) and Advanced Practice Providers (APPs -  Physician Assistants and Nurse Practitioners) who all work together to provide you with the care you need, when you need it.  We recommend signing up for the patient portal called "MyChart".  Sign up information is provided on this After Visit Summary.  MyChart is used to connect with patients for Virtual Visits (Telemedicine).  Patients are able to view lab/test results, encounter notes, upcoming appointments, etc.  Non-urgent messages can be sent to your provider as well.   To learn more about what you can do with MyChart, go to [email or web address].    Your next appointment:   6 month(s)  The format for your next appointment:   In Person  Provider:    You may see Kathlyn Sacramento, MD or one of the following Advanced Practice Providers on your designated Care Team:    Murray Hodgkins, NP  Christell Faith, PA-C  Marrianne Mood, PA-C    Other Instructions - Monitor  your blood pressure/ heart rate/ & weight at home - call is your blood pressure is consistently > 130/80

## 2019-05-21 NOTE — Progress Notes (Signed)
Office Visit    Patient Name: [name] Date of Encounter: 05/21/2019  Primary Care Provider:  Bryson Corona, NP Primary Cardiologist:  Dorris Carnes, MD  Chief Complaint    Chief Complaint  Patient presents with  . office visit    Pt has no complaints. Meds verbally reviewed w/ pt.    79 year old female with history ofon long-term anticoagulation with Eliquis, permanent pacemaker s/p recent change out, hypertension, aortic insufficiency, mitral regurgitation, sleep apnea, GERD, and gout, and seen today for follow-up.   Past Medical History    Past Medical History:  Diagnosis Date  . Anxiety   . Aortic insufficiency    Trivial  . Arthritis   . Asthma   . Bronchitis   . Bursitis, hip   . Claustrophobia   . Claustrophobia   . Dysrhythmia    a fib  . GERD (gastroesophageal reflux disease)    history of  . Gout   . Hyperlipidemia   . Hypertension   . Hypothyroidism   . Mitral regurgitation   . Obesity   . Panic attacks   . Paroxysmal atrial fibrillation (HCC)   . PTSD (post-traumatic stress disorder)   . Rhinitis   . Sick sinus syndrome ([place])   . Sleep apnea    was ordered CPAP, but never fitted for one years ago, Dr has retired   Past Surgical History:  Procedure Laterality Date  . CARDIAC CATHETERIZATION    . DILATATION & CURETTAGE/HYSTEROSCOPY WITH MYOSURE N/A 08/03/2015   Procedure: DILATATION & CURETTAGE/HYSTEROSCOPY WITH MYOSURE;  Surgeon: Alden Hipp, MD;  Location: [facility];  Service: Gynecology;  Laterality: N/A;  Patient has an ICD   . INSERT / REPLACE / REMOVE PACEMAKER    . PACEMAKER INSERTION     Status post insertion  . PPM GENERATOR CHANGEOUT N/A 05/02/2019   Procedure: PPM GENERATOR CHANGEOUT;  Surgeon: Deboraha Sprang, MD;  Location: [facility];  Service: Cardiovascular;  Laterality: N/A;  . ROTATOR CUFF REPAIR     left  . Status post child birth x1    . TONSILLECTOMY    . TOTAL HIP ARTHROPLASTY Left 12/22/2015   Procedure:  TOTAL HIP ARTHROPLASTY;  Surgeon: Dereck Leep, MD;  Location: [facility];  Service: Orthopedics;  Laterality: Left;  . TOTAL HIP REVISION Left 03/01/2018   Procedure: TOTAL HIP REVISION;  Surgeon: Dereck Leep, MD;  Location: [facility];  Service: Orthopedics;  Laterality: Left;    Allergies  Allergies  Allergen Reactions  . Contrast Media [Iodinated Diagnostic Agents] Rash    Contrast dye causes swelling    History of Present Illness    [name] is a 79 y.o. female with PMH as above. She has been seen by Dr. Caryl Comes in the past and s/p generator changeout. She is followed regularly by Dr. Caryl Comes. She has known h/o chronic Afib and on long term [place] and rate control. She recently changed from Warfarin to Eliquis and is satisfied with this change.  PCP notes provided today and to be entered into EMR / scanned. Today, she denies chest pain, palpitations, dyspnea, pnd, orthopnea, n, v, dizziness, syncope, edema, weight gain, or early satiety. She reports that she is overall doing well other than fatigue. She denies s/sx of bleeding on [place]. She is compliant with her medications.   Home Medications    Prior to Admission medications   Medication Sig Start Date End Date Taking? Authorizing Provider  acetaminophen (TYLENOL) 500 MG  tablet Take 1,000 mg by mouth every 6 (six) hours as needed for headache.   Yes [provider]  allopurinol (ZYLOPRIM) 100 MG tablet Take 100 mg by mouth 2 (two) times daily.  10/15/17  Yes [provider]  apixaban (ELIQUIS) 5 MG TABS tablet Take 1 tablet (5 mg total) by mouth 2 (two) times daily. 03/11/19  Yes Deboraha Sprang, MD  cyclobenzaprine (FLEXERIL) 5 MG tablet Take 1 tablet (5 mg total) by mouth 2 (two) times daily as needed for muscle spasms. 06/14/19 12/11/19 Yes Milinda Pointer, MD  liothyronine (CYTOMEL) 5 MCG tablet Take 15 mcg by mouth daily.    Yes [provider]  LORazepam (ATIVAN) 1 MG tablet Take 1 mg by mouth daily as  needed for anxiety.    Yes [provider]  metoprolol tartrate (LOPRESSOR) 50 MG tablet Take 1.5 tablets (75 mg total) by mouth 2 (two) times daily. 05/21/19  Yes Audreyanna Butkiewicz, Malachi Bonds D, PA-C  oxyCODONE (OXY IR/ROXICODONE) 5 MG immediate release tablet Take 1 tablet (5 mg total) by mouth every 6 (six) hours as needed for severe pain. Must last 30 days 04/02/19 05/21/19 Yes Milinda Pointer, MD  pregabalin (LYRICA) 25 MG capsule Take 1 capsule (25 mg total) by mouth 2 (two) times daily. 06/14/19 12/11/19 Yes Milinda Pointer, MD  spironolactone (ALDACTONE) 100 MG tablet Take 100 mg by mouth daily.   Yes [provider]  thyroid (ARMOUR) 60 MG tablet Take 60 mg by mouth daily before breakfast.    Yes [provider]    Review of Systems    She denies chest pain, palpitations, dyspnea, pnd, orthopnea, n, v, dizziness, syncope, edema, weight gain, or early satiety. She reports fatigue.   All other systems reviewed and are otherwise negative except as noted above.  Physical Exam    VS:  BP 130/80 (BP Location: Left Arm, Patient Position: Sitting, Cuff Size: Large)   Pulse 85   Ht 5\' 1"  (1.549 m)   Wt 199 lb (90.3 kg)   SpO2 97%   BMI 37.60 kg/m  , BMI Body mass index is 37.6 kg/m. GEN: Well nourished, well developed, in no acute distress. HEENT: normal. Neck: Supple, no JVD, carotid bruits, or masses. Cardiac: IRIR, no murmurs, rubs, or gallops. No clubbing, cyanosis, edema.  Radials/DP/PT 2+ and equal bilaterally. Generator changeout incision healing well.  Respiratory:  Respirations regular and unlabored, clear to auscultation bilaterally. GI: Soft, nontender, nondistended, BS + x 4. MS: no deformity or atrophy. Skin: warm and dry, no rash. Neuro:  Strength and sensation are intact. Psych: Normal affect.  Accessory Clinical Findings    ECG personally reviewed by me today - atrial fibrillation with controled ventricular rate 81bpm, incomplete RBBB / IVCD- no  acute changes.  VITALS Reviewed today   Temp Readings from Last 3 Encounters:  05/02/19 (!) 97.5 F (36.4 C) (Skin)  03/02/18 97.8 F (36.6 C) (Oral)  02/28/18 97.8 F (36.6 C) (Oral)   BP Readings from Last 3 Encounters:  05/21/19 130/80  05/02/19 121/85  04/22/19 140/80   Pulse Readings from Last 3 Encounters:  05/21/19 85  05/02/19 80  04/22/19 73    Wt Readings from Last 3 Encounters:  05/21/19 199 lb (90.3 kg)  05/02/19 193 lb (87.5 kg)  04/22/19 199 lb 12 oz (90.6 kg)     LABS  reviewed today   [place] present? Yes/No: No  Lab Results  Component Value Date   WBC 7.4 04/22/2019  HGB 14.3 04/22/2019   HCT 41.0 04/22/2019   MCV 94 04/22/2019   PLT 169 04/22/2019   Lab Results  Component Value Date   CREATININE 1.11 (H) 04/22/2019   BUN 20 04/22/2019   NA 140 04/22/2019   K 4.4 04/22/2019   CL 102 04/22/2019   CO2 22 04/22/2019   Lab Results  Component Value Date   ALT 14 11/19/2018   AST 21 11/19/2018   ALKPHOS 57 11/19/2018   BILITOT 1.1 11/19/2018   Lab Results  Component Value Date   CHOL 208 (H) 11/07/2013   HDL 50.00 11/07/2013   LDLCALC 130 (H) 11/07/2013   TRIG 142.0 11/07/2013   CHOLHDL 4 11/07/2013    No results found for: HGBA1C Lab Results  Component Value Date   TSH 1.18 11/07/2013     STUDIES/PROCEDURES reviewed today   None  Assessment & Plan    Chronic Afib --Asx. Doing well on current rate control and [place] with recent change from Warfarin to Eliquis. No s/sx consistent with bleeding. Refill for BB provided.   S/p PPM --S/p generator change-out by Dr. Caryl Comes. Reports healing well with incision site inspected today and without signs of infection or erythema/swelling.  HTN --BP relatively well controlled. Refill for BB provided. Reports she will monitor her BP at home given her borderline pressure today. She will call the office if BP consistently over 130/80.  HLD --2015 LDL 130. She will consider Zetia.     Medication changes: None. Refill provided Labs ordered: None Studies / Imaging ordered: Consider Zetia Future considerations: Zetia Disposition: RTC 6 months   Total time spent with patient today 45 minutes. This includes reviewing records, evaluating the patient, and coordinating care. Face-to-face time >50%.   Arvil Chaco, PA-C 05/21/2019

## 2019-05-26 DIAGNOSIS — M79675 Pain in left toe(s): Secondary | ICD-10-CM | POA: Diagnosis not present

## 2019-05-26 DIAGNOSIS — E538 Deficiency of other specified B group vitamins: Secondary | ICD-10-CM | POA: Diagnosis not present

## 2019-05-26 DIAGNOSIS — B351 Tinea unguium: Secondary | ICD-10-CM | POA: Diagnosis not present

## 2019-05-26 DIAGNOSIS — L600 Ingrowing nail: Secondary | ICD-10-CM | POA: Diagnosis not present

## 2019-05-26 DIAGNOSIS — E559 Vitamin D deficiency, unspecified: Secondary | ICD-10-CM | POA: Diagnosis not present

## 2019-05-26 DIAGNOSIS — M79674 Pain in right toe(s): Secondary | ICD-10-CM | POA: Diagnosis not present

## 2019-05-26 DIAGNOSIS — E039 Hypothyroidism, unspecified: Secondary | ICD-10-CM | POA: Diagnosis not present

## 2019-06-30 ENCOUNTER — Telehealth: Payer: Medicare Other | Admitting: Pain Medicine

## 2019-07-03 ENCOUNTER — Telehealth: Payer: Self-pay | Admitting: *Deleted

## 2019-07-03 NOTE — Telephone Encounter (Signed)
Voicemail left with patient to please call our office to review medications prior to VV,

## 2019-07-04 NOTE — Progress Notes (Signed)
Patient: [name]  Service Category: E/M  Provider: Gaspar Cola, MD  DOB: [birth year]  DOS: 07/07/2019  Location: Office  MRN: [id number]  Setting: Ambulatory outpatient  Referring Provider: Bryson Corona, NP  Type: Established Patient  Specialty: Interventional Pain Management  PCP: Bryson Corona, NP  Location: Remote location  Delivery: TeleHealth     Virtual Encounter - Pain Management PROVIDER NOTE: Information contained herein reflects review and annotations entered in association with encounter. Interpretation of such information and data should be left to medically-trained personnel. Information provided to patient can be located elsewhere in the medical record under "Patient Instructions". Document created using STT-dictation technology, any transcriptional errors that may result from process are unintentional.    Contact & Pharmacy Preferred: [place]: [phone] (home) Mobile: [phone] (mobile) E-mail: [name]_0 .[place] [address] [place] - [place] [address] [place] [address] Phone: [phone] Fax: [phone]   Pre-screening  [name] offered "in-person" vs "virtual" encounter. She indicated preferring virtual for this encounter.   Reason COVID-19*  Social distancing based on CDC and AMA recommendations.   I contacted [name] on 07/07/2019 via telephone.      I clearly identified myself as Gaspar Cola, MD. I verified that I was speaking with the correct person using two identifiers (Name: [name], and date of birth: 03-25-1940).  Consent I sought verbal advanced consent from [name] for virtual visit interactions. I informed [name] of possible security and privacy concerns, risks, and limitations associated with providing "not-in-person" medical evaluation and management services. I also informed [name] of the availability of "in-person" appointments. Finally, I  informed her that there would be a charge for the virtual visit and that she could be  personally, fully or partially, financially responsible for it. [name] expressed understanding and agreed to proceed.   Historic Elements   [name] is a 79 y.o. year old, female patient evaluated today after her last contact with our practice on 07/03/2019. [name]  has a past medical history of Anxiety, Aortic insufficiency, Arthritis, Asthma, Bronchitis, Bursitis, hip, Claustrophobia, Claustrophobia, Dysrhythmia, GERD (gastroesophageal reflux disease), Gout, Hyperlipidemia, Hypertension, Hypothyroidism, Mitral regurgitation, Obesity, Panic attacks, Paroxysmal atrial fibrillation ([place]), PTSD (post-traumatic stress disorder), Rhinitis, Sick sinus syndrome ([place]), and Sleep apnea. She also  has a past surgical history that includes Tonsillectomy; Pacemaker insertion; Status post child birth x1; Rotator cuff repair; Dilatation & curettage/hysteroscopy with myosure (N/A, 08/03/2015); Insert / replace / remove pacemaker; Total hip arthroplasty (Left, 12/22/2015); Cardiac catheterization; Total hip revision (Left, 03/01/2018); and PPM GENERATOR CHANGEOUT (N/A, 05/02/2019). [name] has a current medication list which includes the following prescription(s): acetaminophen, allopurinol, apixaban, cyclobenzaprine, liothyronine, metoprolol tartrate, pregabalin, spironolactone, thyroid, lorazepam, [START ON 07/09/2019] oxycodone, [START ON 08/08/2019] oxycodone, and [START ON 09/07/2019] oxycodone. She  reports that she has never smoked. She has never used smokeless tobacco. She reports that she does not drink alcohol or use drugs. [name] is allergic to contrast media [iodinated diagnostic agents].   HPI  Today, she is being contacted for medication management. The patient indicates doing well with the current medication regimen. No adverse reactions or side effects reported to the medications.   Pharmacotherapy Assessment   Analgesic: Oxycodone IR 5 mg 1 tablet p.o. every 6 hours (20 mg/day of oxycodone)  MME/day:80m/day.   Monitoring: [place] PMP: PDMP reviewed during this encounter.       Pharmacotherapy: No side-effects or adverse reactions reported. Compliance: No  problems identified. Effectiveness: Clinically acceptable. Plan: Refer to "POC".  UDS:  Summary  Date Value Ref Range Status  12/05/2016 FINAL  Final    Comment:    ==================================================================== TOXASSURE SELECT 13 (MW) ==================================================================== Test                             Result       Flag       Units Drug Present and Declared for Prescription Verification   Oxazepam                       30           EXPECTED   ng/mg creat   Temazepam                      17           EXPECTED   ng/mg creat    Oxazepam and temazepam are expected metabolites of diazepam.    Oxazepam is also an expected metabolite of other benzodiazepine    drugs, including chlordiazepoxide, prazepam, clorazepate,    halazepam, and temazepam.  Oxazepam and temazepam are available    as scheduled prescription medications.   Oxycodone                      567          EXPECTED   ng/mg creat   Oxymorphone                    148          EXPECTED   ng/mg creat   Noroxycodone                   1602         EXPECTED   ng/mg creat   Noroxymorphone                 2684         EXPECTED   ng/mg creat    Sources of oxycodone are scheduled prescription medications.    Oxymorphone, noroxycodone, and noroxymorphone are expected    metabolites of oxycodone. Oxymorphone is also available as a    scheduled prescription medication. ==================================================================== Test                      Result    Flag   Units      Ref Range   Creatinine              141              mg/dL      >=20 ==================================================================== Declared  Medications:  The flagging and interpretation on this report are based on the  following declared medications.  Unexpected results may arise from  inaccuracies in the declared medications.  **Note: The testing scope of this panel includes these medications:  Diazepam (Valium)  Oxycodone (Roxicodone)  **Note: The testing scope of this panel does not include following  reported medications:  Colchicine  Hydrochlorothiazide (Maxzide)  Levothyroxine  Metoprolol (Lopressor)  Supplement  Triamterene (Maxzide)  Warfarin ==================================================================== For clinical consultation, please call [phone]. ====================================================================    Laboratory Chemistry Profile   Renal Lab Results  Component Value Date   BUN 20 04/22/2019   CREATININE 1.11 (H) 04/22/2019   BCR 18 04/22/2019   GFR 53.86 (  L) 11/07/2013   GFRAA 55 (L) 04/22/2019   GFRNONAA 47 (L) 04/22/2019     Hepatic Lab Results  Component Value Date   AST 21 11/19/2018   ALT 14 11/19/2018   ALBUMIN 3.7 11/19/2018   ALKPHOS 57 11/19/2018     Electrolytes Lab Results  Component Value Date   NA 140 04/22/2019   K 4.4 04/22/2019   CL 102 04/22/2019   CALCIUM 9.7 04/22/2019   MG 1.5 (L) 11/19/2018     Bone Lab Results  Component Value Date   VD25OH 42.4 11/19/2018   25OHVITD1 39 03/07/2017   25OHVITD2 5.6 03/07/2017   25OHVITD3 33 03/07/2017     Inflammation (CRP: Acute Phase) (ESR: Chronic Phase) Lab Results  Component Value Date   CRP <0.8 11/19/2018   ESRSEDRATE 28 11/19/2018       Note: Above Lab results reviewed.  Imaging  CUP PACEART INCLINIC DEVICE CHECK Pacemaker check and wound check in clinic. Steri-strips removed, wound edges approximated , no redness, edema, or drainage present at wound site. Normal device function. Thresholds, sensing, impedances consistent with previous measurements. Device  programmed to maximize  longevity. No mode switch or high ventricular rates noted. Device programmed at appropriate safety margins. Histogram distribution appropriate for patient activity level. Device programmed to optimize intrinsic conduction.  Estimated longevity 12 yrs 1 month. Follow up with Dr Caryl Comes 07/29/19. Patient enrolled in remote follow-up and next remote scheduled for 07/30/19 and every 3 months after.. Patient education completed.  Assessment  The primary encounter diagnosis was Chronic pain syndrome. Diagnoses of Chronic hip pain (Primary Area of Pain) (Left), Chronic hip pain (Bilateral), and Pharmacologic therapy were also pertinent to this visit.  Plan of Care  Problem-specific:  No problem-specific Assessment & Plan notes found for this encounter.  [name] has a current medication list which includes the following long-term medication(s): allopurinol, apixaban, cyclobenzaprine, metoprolol tartrate, pregabalin, spironolactone, thyroid, [START ON 07/09/2019] oxycodone, [START ON 08/08/2019] oxycodone, and [START ON 09/07/2019] oxycodone.  Pharmacotherapy (Medications Ordered): Meds ordered this encounter  Medications  . oxyCODONE (OXY IR/ROXICODONE) 5 MG immediate release tablet    Sig: Take 1 tablet (5 mg total) by mouth every 6 (six) hours as needed for severe pain. Must last 30 days    Dispense:  120 tablet    Refill:  0    Chronic Pain: STOP Act (Not applicable) Fill 1 day early if closed on refill date. Do not fill until: 07/09/2019. To last until: 08/08/2019. Avoid benzodiazepines within 8 hours of opioids  . oxyCODONE (OXY IR/ROXICODONE) 5 MG immediate release tablet    Sig: Take 1 tablet (5 mg total) by mouth every 6 (six) hours as needed for severe pain. Must last 30 days    Dispense:  120 tablet    Refill:  0    Chronic Pain: STOP Act (Not applicable) Fill 1 day early if closed on refill date. Do not fill until: 08/08/2019. To last until: 09/07/2019. Avoid benzodiazepines within 8 hours of  opioids  . oxyCODONE (OXY IR/ROXICODONE) 5 MG immediate release tablet    Sig: Take 1 tablet (5 mg total) by mouth every 6 (six) hours as needed for severe pain. Must last 30 days    Dispense:  120 tablet    Refill:  0    Chronic Pain: STOP Act (Not applicable) Fill 1 day early if closed on refill date. Do not fill until: 09/07/2019. To last until: 10/07/2019. Avoid benzodiazepines within 8 hours of  opioids   Orders:  Orders Placed This Encounter  Procedures  . ToxASSURE Select 13 (MW), Urine    Volume: 30 ml(s). Minimum 3 ml of urine is needed. Document temperature of fresh sample. Indications: Long term (current) use of opiate analgesic (P71.062)    Order Specific Question:   Release to patient    Answer:   Immediate   Follow-up plan:   Return in about 13 weeks (around 10/06/2019) for (F2F), (MM).      Considering:   None at this time.    Palliative PRN treatment(s):   None at this time.        Recent Visits No visits were found meeting these conditions.  Showing recent visits within past 90 days and meeting all other requirements   Today's Visits Date Type Provider Dept  07/07/19 Telemedicine Milinda Pointer, MD [facility]  Showing today's visits and meeting all other requirements   Future Appointments No visits were found meeting these conditions.  Showing future appointments within next 90 days and meeting all other requirements   I discussed the assessment and treatment plan with the patient. The patient was provided an opportunity to ask questions and all were answered. The patient agreed with the plan and demonstrated an understanding of the instructions.  Patient advised to call back or seek an in-person evaluation if the symptoms or condition worsens.  Duration of encounter: 11 minutes.  Note by: Gaspar Cola, MD Date: 07/07/2019; Time: 1:18 PM

## 2019-07-07 ENCOUNTER — Encounter: Payer: Self-pay | Admitting: Pain Medicine

## 2019-07-07 ENCOUNTER — Ambulatory Visit: Payer: Medicare Other | Attending: Pain Medicine | Admitting: Pain Medicine

## 2019-07-07 DIAGNOSIS — M25551 Pain in right hip: Secondary | ICD-10-CM | POA: Diagnosis not present

## 2019-07-07 DIAGNOSIS — M25552 Pain in left hip: Secondary | ICD-10-CM | POA: Diagnosis not present

## 2019-07-07 DIAGNOSIS — G894 Chronic pain syndrome: Secondary | ICD-10-CM | POA: Diagnosis not present

## 2019-07-07 DIAGNOSIS — Z79899 Other long term (current) drug therapy: Secondary | ICD-10-CM | POA: Diagnosis not present

## 2019-07-07 DIAGNOSIS — G8929 Other chronic pain: Secondary | ICD-10-CM

## 2019-07-07 MED ORDER — OXYCODONE HCL 5 MG PO TABS: 5.0000 mg | ORAL_TABLET | Freq: Four times a day (QID) | ORAL | 0 refills | Status: DC | PRN

## 2019-07-17 DIAGNOSIS — Z79899 Other long term (current) drug therapy: Secondary | ICD-10-CM | POA: Diagnosis not present

## 2019-07-17 DIAGNOSIS — G894 Chronic pain syndrome: Secondary | ICD-10-CM | POA: Diagnosis not present

## 2019-07-21 LAB — TOXASSURE SELECT 13 (MW), URINE

## 2019-07-29 ENCOUNTER — Other Ambulatory Visit: Payer: Self-pay

## 2019-07-29 ENCOUNTER — Ambulatory Visit (INDEPENDENT_AMBULATORY_CARE_PROVIDER_SITE_OTHER): Payer: Medicare Other | Admitting: Internal Medicine

## 2019-07-29 ENCOUNTER — Encounter: Payer: Self-pay | Admitting: Internal Medicine

## 2019-07-29 VITALS — BP 120/70 | HR 69 | Ht 61.0 in | Wt 208.0 lb

## 2019-07-29 DIAGNOSIS — R001 Bradycardia, unspecified: Secondary | ICD-10-CM | POA: Diagnosis not present

## 2019-07-29 DIAGNOSIS — Z950 Presence of cardiac pacemaker: Secondary | ICD-10-CM | POA: Diagnosis not present

## 2019-07-29 DIAGNOSIS — I4821 Permanent atrial fibrillation: Secondary | ICD-10-CM | POA: Diagnosis not present

## 2019-07-29 NOTE — Patient Instructions (Signed)
Medication Instructions:  - Your physician recommends that you continue on your current medications as directed. Please refer to the Current Medication list given to you today.    *If you need a refill on your cardiac medications before your next appointment, please call your pharmacy*   Lab Work: - none ordered  If you have labs (blood work) drawn today and your tests are completely normal, you will receive your results only by: . MyChart Message (if you have MyChart) OR . A paper copy in the mail If you have any lab test that is abnormal or we need to change your treatment, we will call you to review the results.   Testing/Procedures: - none ordered   Follow-Up: At [facility], you and your health needs are our priority.  As part of our continuing mission to provide you with exceptional heart care, we have created designated Provider Care Teams.  These Care Teams include your primary Cardiologist (physician) and Advanced Practice Providers (APPs -  Physician Assistants and Nurse Practitioners) who all work together to provide you with the care you need, when you need it.  We recommend signing up for the patient portal called "MyChart".  Sign up information is provided on this After Visit Summary.  MyChart is used to connect with patients for Virtual Visits (Telemedicine).  Patients are able to view lab/test results, encounter notes, upcoming appointments, etc.  Non-urgent messages can be sent to your provider as well.   To learn more about what you can do with MyChart, go to [email or web address].    Your next appointment:   9 month(s)  The format for your next appointment:   In Person  Provider:   Steven Klein, MD   Other Instructions n/a  

## 2019-07-29 NOTE — Progress Notes (Signed)
Patient Care Team: Briles, Jodelle Green, NP as PCP - General (Family Medicine) Fay Records, MD as PCP - Cardiology (Cardiology)   HPI  [name] is a 79 y.o. female Seen in follow-up for pacemaker implanted 2010 by Dr. Elliot Cousin for bradycardia in the context of permanent atrial fibrillation.  Underwent generator replacement 3/21 anticoagulated with apixoban << warfarin ( 9/20)       Date Cr K Hgb  8/19 1.1  12.1  1/20 1.05 4.1 14.9  9/20 1.14 3.5   2/21 1.11 4.4 14.3    The patient denies chest pain, shortness of breath, nocturnal dyspnea, orthopnea or peripheral edema.  There have been no palpitations, lightheadedness or syncope.    Thromboembolic risk factors ( age  -2, HTN-1, Gender-1) for a CHADSVASc Score of >=4   Records and Results Reviewed   Rx hypothyroidism followed in [place]  Past Medical History:  Diagnosis Date  . Anxiety   . Aortic insufficiency    Trivial  . Arthritis   . Asthma   . Bronchitis   . Bursitis, hip   . Claustrophobia   . Claustrophobia   . Dysrhythmia    a fib  . GERD (gastroesophageal reflux disease)    history of  . Gout   . Hyperlipidemia   . Hypertension   . Hypothyroidism   . Mitral regurgitation   . Obesity   . Panic attacks   . Paroxysmal atrial fibrillation (HCC)   . PTSD (post-traumatic stress disorder)   . Rhinitis   . Sick sinus syndrome ([place])   . Sleep apnea    was ordered CPAP, but never fitted for one years ago, Dr has retired    Past Surgical History:  Procedure Laterality Date  . CARDIAC CATHETERIZATION    . DILATATION & CURETTAGE/HYSTEROSCOPY WITH MYOSURE N/A 08/03/2015   Procedure: DILATATION & CURETTAGE/HYSTEROSCOPY WITH MYOSURE;  Surgeon: Alden Hipp, MD;  Location: [facility];  Service: Gynecology;  Laterality: N/A;  Patient has an ICD   . INSERT / REPLACE / REMOVE PACEMAKER    . PACEMAKER INSERTION     Status post insertion  . PPM GENERATOR CHANGEOUT N/A 05/02/2019   Procedure: PPM GENERATOR CHANGEOUT;   Surgeon: Deboraha Sprang, MD;  Location: [facility];  Service: Cardiovascular;  Laterality: N/A;  . ROTATOR CUFF REPAIR     left  . Status post child birth x1    . TONSILLECTOMY    . TOTAL HIP ARTHROPLASTY Left 12/22/2015   Procedure: TOTAL HIP ARTHROPLASTY;  Surgeon: Dereck Leep, MD;  Location: [facility];  Service: Orthopedics;  Laterality: Left;  . TOTAL HIP REVISION Left 03/01/2018   Procedure: TOTAL HIP REVISION;  Surgeon: Dereck Leep, MD;  Location: [facility];  Service: Orthopedics;  Laterality: Left;    Current Meds  Medication Sig  . acetaminophen (TYLENOL) 500 MG tablet Take 1,000 mg by mouth every 6 (six) hours as needed for headache.  . allopurinol (ZYLOPRIM) 100 MG tablet Take 100 mg by mouth 2 (two) times daily.   Marland Kitchen apixaban (ELIQUIS) 5 MG TABS tablet Take 1 tablet (5 mg total) by mouth 2 (two) times daily.  . cyclobenzaprine (FLEXERIL) 5 MG tablet Take 1 tablet (5 mg total) by mouth 2 (two) times daily as needed for muscle spasms.  Marland Kitchen liothyronine (CYTOMEL) 5 MCG tablet Take 15 mcg by mouth daily.   . metoprolol tartrate (LOPRESSOR) 50 MG tablet Take 1.5 tablets (75 mg total) by  mouth 2 (two) times daily.  Marland Kitchen oxyCODONE (OXY IR/ROXICODONE) 5 MG immediate release tablet Take 1 tablet (5 mg total) by mouth every 6 (six) hours as needed for severe pain. Must last 30 days  . [START ON 08/08/2019] oxyCODONE (OXY IR/ROXICODONE) 5 MG immediate release tablet Take 1 tablet (5 mg total) by mouth every 6 (six) hours as needed for severe pain. Must last 30 days  . [START ON 09/07/2019] oxyCODONE (OXY IR/ROXICODONE) 5 MG immediate release tablet Take 1 tablet (5 mg total) by mouth every 6 (six) hours as needed for severe pain. Must last 30 days  . pregabalin (LYRICA) 25 MG capsule Take 1 capsule (25 mg total) by mouth 2 (two) times daily.  Marland Kitchen spironolactone (ALDACTONE) 100 MG tablet Take 100 mg by mouth daily.  Marland Kitchen thyroid (ARMOUR) 60 MG tablet Take 60 mg by mouth daily before breakfast.      Allergies  Allergen Reactions  . Contrast Media [Iodinated Diagnostic Agents] Rash    Contrast dye causes swelling      Review of Systems negative except from HPI and PMH  Physical Exam   BP 120/70 (BP Location: Left Arm, Patient Position: Sitting, Cuff Size: Normal)   Pulse 69   Ht 5\' 1"  (1.549 m)   Wt 208 lb (94.3 kg)   SpO2 98%   BMI 39.30 kg/m  Well developed and Morbidly obese in no acute distress HENT normal Neck suppl Clear Device pocket well healed; without hematoma or erythema.  There is no tethering  Regular rate and rhythm, no murmur Abd-soft with active BS No Clubbing cyanosis  edema Skin-warm and dry A & Oriented  Grossly normal sensory and motor function   ECG atrial fib @ 74 -/10/40   Assessment and  Plan  Atrial fibrillation-permanent  Bradycardia symptomatic  High Risk Medication Surveillance Spironolactone   Pacemaker St Jude    On Anticoagulation;  No bleeding issues   No palpitations in afib  Doing well following generator replacement

## 2019-08-01 ENCOUNTER — Ambulatory Visit (INDEPENDENT_AMBULATORY_CARE_PROVIDER_SITE_OTHER): Payer: Medicare Other | Admitting: *Deleted

## 2019-08-01 DIAGNOSIS — I495 Sick sinus syndrome: Secondary | ICD-10-CM | POA: Diagnosis not present

## 2019-08-01 LAB — CUP PACEART REMOTE DEVICE CHECK
Battery Remaining Longevity: 142 mo
Battery Remaining Percentage: 95.5 %
Battery Voltage: 3.05 V
Brady Statistic RV Percent Paced: 8.2 %
Date Time Interrogation Session: 20210604020015
Implantable Lead Implant Date: 20090317
Implantable Lead Implant Date: 20090317
Implantable Lead Location: 753859
Implantable Lead Location: 753860
Implantable Pulse Generator Implant Date: 20210305
Lead Channel Impedance Value: 510 Ohm
Lead Channel Pacing Threshold Amplitude: 0.5 V
Lead Channel Pacing Threshold Pulse Width: 0.4 ms
Lead Channel Sensing Intrinsic Amplitude: 12 mV
Lead Channel Setting Pacing Amplitude: 2.5 V
Lead Channel Setting Pacing Pulse Width: 0.4 ms
Lead Channel Setting Sensing Sensitivity: 2 mV
Pulse Gen Model: 2272
Pulse Gen Serial Number: 3802091

## 2019-08-05 NOTE — Progress Notes (Signed)
Remote pacemaker transmission.   

## 2019-08-11 LAB — CUP PACEART INCLINIC DEVICE CHECK
Battery Remaining Longevity: 142 mo
Battery Voltage: 3.05 V
Brady Statistic RA Percent Paced: 0 %
Brady Statistic RV Percent Paced: 14 %
Date Time Interrogation Session: 20210601103100
Implantable Lead Implant Date: 20090317
Implantable Lead Implant Date: 20090317
Implantable Lead Location: 753859
Implantable Lead Location: 753860
Implantable Pulse Generator Implant Date: 20210305
Lead Channel Impedance Value: 512.5 Ohm
Lead Channel Pacing Threshold Amplitude: 0.5 V
Lead Channel Pacing Threshold Amplitude: 0.5 V
Lead Channel Pacing Threshold Pulse Width: 0.4 ms
Lead Channel Pacing Threshold Pulse Width: 0.4 ms
Lead Channel Sensing Intrinsic Amplitude: 12 mV
Lead Channel Setting Pacing Amplitude: 2.5 V
Lead Channel Setting Pacing Pulse Width: 0.4 ms
Lead Channel Setting Sensing Sensitivity: 2 mV
Pulse Gen Model: 2272
Pulse Gen Serial Number: 3802091

## 2019-09-08 ENCOUNTER — Other Ambulatory Visit: Payer: Self-pay | Admitting: Internal Medicine

## 2019-09-09 NOTE — Telephone Encounter (Signed)
Pt last saw Dr Caryl Comes in [facility] 07/29/19, last labs 04/22/19 Creat 1.11, age 79, weight 94.3kg, based on specified criteria pt is on appropriate dosage of Eliquis 5mg  BID.  Will refill rx.

## 2019-09-12 DIAGNOSIS — Z7901 Long term (current) use of anticoagulants: Secondary | ICD-10-CM | POA: Diagnosis not present

## 2019-09-12 DIAGNOSIS — M1712 Unilateral primary osteoarthritis, left knee: Secondary | ICD-10-CM | POA: Diagnosis not present

## 2019-09-12 DIAGNOSIS — S5012XA Contusion of left forearm, initial encounter: Secondary | ICD-10-CM | POA: Diagnosis not present

## 2019-09-12 DIAGNOSIS — S8992XA Unspecified injury of left lower leg, initial encounter: Secondary | ICD-10-CM | POA: Diagnosis not present

## 2019-09-12 DIAGNOSIS — S40022A Contusion of left upper arm, initial encounter: Secondary | ICD-10-CM | POA: Diagnosis not present

## 2019-09-12 DIAGNOSIS — W108XXA Fall (on) (from) other stairs and steps, initial encounter: Secondary | ICD-10-CM | POA: Diagnosis not present

## 2019-09-12 DIAGNOSIS — I480 Paroxysmal atrial fibrillation: Secondary | ICD-10-CM | POA: Diagnosis not present

## 2019-09-24 DIAGNOSIS — S8012XD Contusion of left lower leg, subsequent encounter: Secondary | ICD-10-CM | POA: Diagnosis not present

## 2019-09-24 DIAGNOSIS — S8992XD Unspecified injury of left lower leg, subsequent encounter: Secondary | ICD-10-CM | POA: Diagnosis not present

## 2019-09-24 DIAGNOSIS — Z7901 Long term (current) use of anticoagulants: Secondary | ICD-10-CM | POA: Diagnosis not present

## 2019-10-01 DIAGNOSIS — S8012XA Contusion of left lower leg, initial encounter: Secondary | ICD-10-CM | POA: Diagnosis not present

## 2019-10-04 NOTE — Progress Notes (Signed)
PROVIDER NOTE: Information contained herein reflects review and annotations entered in association with encounter. Interpretation of such information and data should be left to medically-trained personnel. Information provided to patient can be located elsewhere in the medical record under "Patient Instructions". Document created using STT-dictation technology, any transcriptional errors that may result from process are unintentional.    Patient: [name]  Service Category: E/M  Provider: Gaspar Cola, MD  DOB: May 22, 1940  DOS: 10/06/2019  Specialty: Interventional Pain Management  MRN: [id number]  Setting: Ambulatory outpatient  PCP: Bryson Corona, NP  Type: Established Patient    Referring Provider: Bryson Corona, NP  Location: Office  Delivery: Face-to-face     HPI  Reason for encounter: [name], a 79 y.o. year old female, is here today for evaluation and management of her Chronic pain syndrome [G89.4]. [name] primary complain today is Leg Pain Last encounter: Practice (07/03/2019). My last encounter with her was on Visit date not found. Pertinent problems: [name] has Chronic hip pain (1ry area of Pain) (Left); Osteoarthritis of hip (Left); Trochanteric bursitis of hip (Left); Lumbar central spinal stenosis (L4-5); Lumbar facet arthropathy ([place]); Osteoarthritis of sacroiliac joint (with vacuum phenomena in) (Bilateral); Avascular necrosis of left femoral head ([place]); Spinal stenosis of lumbar region; S/P total hip arthroplasty; Chronic pain syndrome; S/P total hip replacement (Left); S/P revision of total hip; Chronic hip pain (Bilateral); Chronic hip pain s/p THR  (Left); Neurogenic pain; Chronic musculoskeletal pain; and Muscle spasms of lower extremity (left hip) on their pertinent problem list. Pain Assessment: Severity of Chronic pain is reported as a 3 /10. Location: Leg (left) Right/pain radiaties below knee. Onset: More than a month ago. Quality: Aching. Timing:  Intermittent. Modifying factor(s): meds,. Vitals:  height is _0  (1.549 m) and weight is 205 lb (93 kg). Her temperature is 97.4 F (36.3 C) (abnormal). Her blood pressure is 137/83 and her pulse is 65. Her oxygen saturation is 100%.    The patient indicates doing well with the current medication regimen. No adverse reactions or side effects reported to the medications.  PMP compliant.  UDS compliant.  Today we have provided the patient with enough medication prescriptions to last until 01/05/2020. Transfer: Cyclobenzaprine (Flexeril) 5 mg tablet, 1 tablet p.o. twice daily (#60/month) (12/11/2019); pregabalin (Lyrica) 25 mg capsule, 1 tablet p.o. twice daily (#60/month) (12/11/2019).  Pharmacotherapy Assessment   Analgesic: Oxycodone IR 5 mg 1 tablet p.o. every 6 hours (20 mg/day of oxycodone)  MME/day:75m/day.   Monitoring: [place] PMP: PDMP reviewed during this encounter.       Pharmacotherapy: No side-effects or adverse reactions reported. Compliance: No problems identified. Effectiveness: Clinically acceptable.  BChauncey Fischer RN  10/06/2019 10:53 AM  Sign when Signing Visit Nursing Pain Medication Assessment:  Safety precautions to be maintained throughout the outpatient stay will include: orient to surroundings, keep bed in low position, maintain call bell within reach at all times, provide assistance with transfer out of bed and ambulation.  Medication Inspection Compliance: Pill count conducted under aseptic conditions, in front of the patient. Neither the pills nor the bottle was removed from the patient's sight at any time. Once count was completed pills were immediately returned to the patient in their original bottle.  Medication: Oxycodone IR Pill/Patch Count: 20 of 120 pills remain Pill/Patch Appearance: Markings consistent with prescribed medication Bottle Appearance: Standard pharmacy container. Clearly labeled. Filled Date: 7 / 156/ 21 Last Medication intake:   YesterdaySafety precautions to be  maintained throughout the outpatient stay will include: orient to surroundings, keep bed in low position, maintain call bell within reach at all times, provide assistance with transfer out of bed and ambulation.     UDS:  Summary  Date Value Ref Range Status  07/17/2019 Note  Final    Comment:    ==================================================================== [employer] Select 13 (MW) ==================================================================== Test                             Result       Flag       Units Drug Present and Declared for Prescription Verification   Oxycodone                      1677         EXPECTED   ng/mg creat   Oxymorphone                    1013         EXPECTED   ng/mg creat   Noroxycodone                   2315         EXPECTED   ng/mg creat   Noroxymorphone                 254          EXPECTED   ng/mg creat    Sources of oxycodone are scheduled prescription medications.    Oxymorphone, noroxycodone, and noroxymorphone are expected    metabolites of oxycodone. Oxymorphone is also available as a    scheduled prescription medication. Drug Absent but Declared for Prescription Verification   Lorazepam                      Not Detected UNEXPECTED ng/mg creat ==================================================================== Test                      Result    Flag   Units      Ref Range   Creatinine              194              mg/dL      >=20 ==================================================================== Declared Medications:  The flagging and interpretation on this report are based on the  following declared medications.  Unexpected results may arise from  inaccuracies in the declared medications.  **Note: The testing scope of this panel includes these medications:  Lorazepam (Ativan)  Oxycodone  **Note: The testing scope of this panel does not include the  following reported medications:  Acetaminophen   Allopurinol  Apixaban  Cyclobenzaprine  Liothyronine (Cytomel)  Metoprolol (Lopressor)  Pregabalin (Lyrica)  Spironolactone  Thyroid: Liothyronine/Levothyroxine ([place]) ==================================================================== For clinical consultation, please call [phone]. ====================================================================      ROS  Constitutional: Denies any fever or chills Gastrointestinal: No reported hemesis, hematochezia, vomiting, or acute GI distress Musculoskeletal: Denies any acute onset joint swelling, redness, loss of ROM, or weakness Neurological: No reported episodes of acute onset apraxia, aphasia, dysarthria, agnosia, amnesia, paralysis, loss of coordination, or loss of consciousness  Medication Review  acetaminophen, allopurinol, apixaban, cyclobenzaprine, liothyronine, metoprolol tartrate, oxyCODONE, pregabalin, spironolactone, and thyroid  History Review  Allergy: [name] is allergic to contrast media [iodinated diagnostic agents]. Drug: [name]  reports no history of drug use. Alcohol:  reports no history of alcohol use. Tobacco:  reports that she has never smoked. She has never used smokeless tobacco. Social: [name]  reports that she has never smoked. She has never used smokeless tobacco. She reports that she does not drink alcohol and does not use drugs. Medical:  has a past medical history of Anxiety, Aortic insufficiency, Arthritis, Asthma, Bronchitis, Bursitis, hip, Claustrophobia, Claustrophobia, Dysrhythmia, GERD (gastroesophageal reflux disease), Gout, Hyperlipidemia, Hypertension, Hypothyroidism, Mitral regurgitation, Obesity, Panic attacks, Paroxysmal atrial fibrillation ([place]), PTSD (post-traumatic stress disorder), Rhinitis, Sick sinus syndrome ([place]), and Sleep apnea. Surgical: [name]  has a past surgical history that includes Tonsillectomy; Pacemaker insertion; Status post child birth x1; Rotator cuff repair;  Dilatation & curettage/hysteroscopy with myosure (N/A, 08/03/2015); Insert / replace / remove pacemaker; Total hip arthroplasty (Left, 12/22/2015); Cardiac catheterization; Total hip revision (Left, 03/01/2018); and PPM GENERATOR CHANGEOUT (N/A, 05/02/2019). Family: family history includes Alzheimer's disease in her father; Arthritis in her sister; Cancer in her brother and maternal grandfather; Heart disease in her mother; Hypertension in her sister; Stroke in her mother.  Laboratory Chemistry Profile   Renal Lab Results  Component Value Date   BUN 20 04/22/2019   CREATININE 1.11 (H) 04/22/2019   BCR 18 04/22/2019   GFR 53.86 (L) 11/07/2013   GFRAA 55 (L) 04/22/2019   GFRNONAA 47 (L) 04/22/2019     Hepatic Lab Results  Component Value Date   AST 21 11/19/2018   ALT 14 11/19/2018   ALBUMIN 3.7 11/19/2018   ALKPHOS 57 11/19/2018     Electrolytes Lab Results  Component Value Date   NA 140 04/22/2019   K 4.4 04/22/2019   CL 102 04/22/2019   CALCIUM 9.7 04/22/2019   MG 1.5 (L) 11/19/2018     Bone Lab Results  Component Value Date   VD25OH 42.4 11/19/2018   25OHVITD1 39 03/07/2017   25OHVITD2 5.6 03/07/2017   25OHVITD3 33 03/07/2017     Inflammation (CRP: Acute Phase) (ESR: Chronic Phase) Lab Results  Component Value Date   CRP <0.8 11/19/2018   ESRSEDRATE 28 11/19/2018       Note: Above Lab results reviewed.  Recent Imaging Review  CUP PACEART INCLINIC DEVICE CHECK Pacemaker check in clinic. Normal device function. Thresholds, sensing, impedances consistent with previous measurements. Device programmed to maximize longevity. Permanent AF, + Eliquis. No high ventricular rates noted. Device programmed at appropriate  safety margins. Histogram distribution appropriate for patient activity level. Device programmed to optimize intrinsic conduction. Estimated longevity 11.8 years. Patient enrolled in remote follow-up. Patient education completed. Merlin on 08/01/19 and ROV  with  SK/B in 9 months.Levander Campion BSN, RN, CCDS Note: Reviewed        Physical Exam  General appearance: Well nourished, well developed, and well hydrated. In no apparent acute distress Mental status: Alert, oriented x 3 (person, place, & time)       Respiratory: No evidence of acute respiratory distress Eyes: PERLA Vitals: BP 137/83    Pulse 65    Temp (!) 97.4 F (36.3 C)    Ht _0  (1.549 m)    Wt 205 lb (93 kg)    SpO2 100%    BMI 38.73 kg/m  BMI: Estimated body mass index is 38.73 kg/m as calculated from the following:   Height as of this encounter: _1  (1.549 m).   Weight as of this encounter: 205 lb (93 kg). Ideal: Ideal body weight: 47.8 kg (105 lb 6.1 oz) Adjusted ideal body weight: 65.9  kg (145 lb 3.6 oz)  Assessment   Status Diagnosis  Controlled Controlled Controlled 1. Chronic pain syndrome   2. Chronic hip pain (1ry area of Pain) (Left)   3. Pharmacologic therapy      Updated Problems: No problems updated.  Plan of Care  Problem-specific:  No problem-specific Assessment & Plan notes found for this encounter.  [name] has a current medication list which includes the following long-term medication(s): allopurinol, cyclobenzaprine, eliquis, metoprolol tartrate, oxycodone, [START ON 11/06/2019] oxycodone, [START ON 12/06/2019] oxycodone, pregabalin, spironolactone, and thyroid.  Pharmacotherapy (Medications Ordered): Meds ordered this encounter  Medications   oxyCODONE (OXY IR/ROXICODONE) 5 MG immediate release tablet    Sig: Take 1 tablet (5 mg total) by mouth every 6 (six) hours as needed for severe pain. Must last 30 days    Dispense:  120 tablet    Refill:  0    Chronic Pain: STOP Act (Not applicable) Fill 1 day early if closed on refill date. Do not fill until: 10/07/2019. To last until: 11/06/2019. Avoid benzodiazepines within 8 hours of opioids   oxyCODONE (OXY IR/ROXICODONE) 5 MG immediate release tablet    Sig: Take 1 tablet (5 mg total) by mouth  every 6 (six) hours as needed for severe pain. Must last 30 days    Dispense:  120 tablet    Refill:  0    Chronic Pain: STOP Act (Not applicable) Fill 1 day early if closed on refill date. Do not fill until: 11/06/2019. To last until: 12/06/2019. Avoid benzodiazepines within 8 hours of opioids   oxyCODONE (OXY IR/ROXICODONE) 5 MG immediate release tablet    Sig: Take 1 tablet (5 mg total) by mouth every 6 (six) hours as needed for severe pain. Must last 30 days    Dispense:  120 tablet    Refill:  0    Chronic Pain: STOP Act (Not applicable) Fill 1 day early if closed on refill date. Do not fill until: 12/06/2019. To last until: 01/05/2020. Avoid benzodiazepines within 8 hours of opioids   Orders:  No orders of the defined types were placed in this encounter.  Follow-up plan:   Return in about 13 weeks (around 01/05/2020) for (20-min), (F2F), (Med Mgmt), on E/M day.      Considering:   None at this time.    Palliative PRN treatment(s):   None at this time.     Recent Visits Date Type Provider Dept  10/06/19 Office Visit Milinda Pointer, MD [facility]  Showing recent visits within past 90 days and meeting all other requirements Future Appointments Date Type Provider Dept  12/22/19 Appointment Milinda Pointer, MD [facility]  Showing future appointments within next 90 days and meeting all other requirements  I discussed the assessment and treatment plan with the patient. The patient was provided an opportunity to ask questions and all were answered. The patient agreed with the plan and demonstrated an understanding of the instructions.  Patient advised to call back or seek an in-person evaluation if the symptoms or condition worsens.  Duration of encounter: 30 minutes.  Note by: Gaspar Cola, MD Date: 10/06/2019; Time: 11:53 AM

## 2019-10-06 ENCOUNTER — Other Ambulatory Visit: Payer: Self-pay

## 2019-10-06 ENCOUNTER — Encounter: Payer: Self-pay | Admitting: Pain Medicine

## 2019-10-06 ENCOUNTER — Ambulatory Visit: Payer: Medicare Other | Attending: Pain Medicine | Admitting: Pain Medicine

## 2019-10-06 VITALS — BP 137/83 | HR 65 | Temp 97.4°F | Ht 61.0 in | Wt 205.0 lb

## 2019-10-06 DIAGNOSIS — G8929 Other chronic pain: Secondary | ICD-10-CM | POA: Diagnosis not present

## 2019-10-06 DIAGNOSIS — Z79899 Other long term (current) drug therapy: Secondary | ICD-10-CM | POA: Diagnosis not present

## 2019-10-06 DIAGNOSIS — M25552 Pain in left hip: Secondary | ICD-10-CM | POA: Diagnosis not present

## 2019-10-06 DIAGNOSIS — G894 Chronic pain syndrome: Secondary | ICD-10-CM

## 2019-10-06 MED ORDER — OXYCODONE HCL 5 MG PO TABS: 5.0000 mg | ORAL_TABLET | Freq: Four times a day (QID) | ORAL | 0 refills | Status: DC | PRN

## 2019-10-06 NOTE — Progress Notes (Signed)
Nursing Pain Medication Assessment:  Safety precautions to be maintained throughout the outpatient stay will include: orient to surroundings, keep bed in low position, maintain call bell within reach at all times, provide assistance with transfer out of bed and ambulation.  Medication Inspection Compliance: Pill count conducted under aseptic conditions, in front of the patient. Neither the pills nor the bottle was removed from the patient's sight at any time. Once count was completed pills were immediately returned to the patient in their original bottle.  Medication: Oxycodone IR Pill/Patch Count: 20 of 120 pills remain Pill/Patch Appearance: Markings consistent with prescribed medication Bottle Appearance: Standard pharmacy container. Clearly labeled. Filled Date: 7 / 40 / 21 Last Medication intake:  YesterdaySafety precautions to be maintained throughout the outpatient stay will include: orient to surroundings, keep bed in low position, maintain call bell within reach at all times, provide assistance with transfer out of bed and ambulation.

## 2019-10-06 NOTE — Patient Instructions (Signed)
____________________________________________________________________________________________  Drug Holidays (Slow)  What is a "Drug Holiday"? Drug Holiday: is the name given to the period of time during which a patient stops taking a medication(s) for the purpose of eliminating tolerance to the drug.  Benefits . Improved effectiveness of opioids. . Decreased opioid dose needed to achieve benefits. . Improved pain with lesser dose.  What is tolerance? Tolerance: is the progressive decreased in effectiveness of a drug due to its repetitive use. With repetitive use, the body gets use to the medication and as a consequence, it loses its effectiveness. This is a common problem seen with opioid pain medications. As a result, a larger dose of the drug is needed to achieve the same effect that used to be obtained with a smaller dose.  How long should a "Drug Holiday" last? You should stay off of the pain medicine for at least 14 consecutive days. (2 weeks)  Should I stop the medicine "cold turkey"? No. You should always coordinate with your Pain Specialist so that he/she can provide you with the correct medication dose to make the transition as smoothly as possible.  How do I stop the medicine? Slowly. You will be instructed to decrease the daily amount of pills that you take by one (1) pill every seven (7) days. This is called a "slow downward taper" of your dose. For example: if you normally take four (4) pills per day, you will be asked to drop this dose to three (3) pills per day for seven (7) days, then to two (2) pills per day for seven (7) days, then to one (1) per day for seven (7) days, and at the end of those last seven (7) days, this is when the "Drug Holiday" would start.   Will I have withdrawals? By doing a "slow downward taper" like this one, it is unlikely that you will experience any significant withdrawal symptoms. Typically, what triggers withdrawals is the sudden stop of a high  dose opioid therapy. Withdrawals can usually be avoided by slowly decreasing the dose over a prolonged period of time. If you do not follow these instructions and decide to stop your medication abruptly, withdrawals may be possible.  What are withdrawals? Withdrawals: refers to the wide range of symptoms that occur after stopping or dramatically reducing opiate drugs after heavy and prolonged use. Withdrawal symptoms do not occur to patients that use low dose opioids, or those who take the medication sporadically. Contrary to benzodiazepine (example: Valium, Xanax, etc.) or alcohol withdrawals ("Delirium Tremens"), opioid withdrawals are not lethal. Withdrawals are the physical manifestation of the body getting rid of the excess receptors.  Expected Symptoms Early symptoms of withdrawal may include: . Agitation . Anxiety . Muscle aches . Increased tearing . Insomnia . Runny nose . Sweating . Yawning  Late symptoms of withdrawal may include: . Abdominal cramping . Diarrhea . Dilated pupils . Goose bumps . Nausea . Vomiting  Will I experience withdrawals? Due to the slow nature of the taper, it is very unlikely that you will experience any.  What is a slow taper? Taper: refers to the gradual decrease in dose.  (Last update: 09/17/2019) ____________________________________________________________________________________________    ____________________________________________________________________________________________  Medication Rules  Purpose: To inform patients, and their family members, of our rules and regulations.  Applies to: All patients receiving prescriptions (written or electronic).  Pharmacy of record: Pharmacy where electronic prescriptions will be sent. If written prescriptions are taken to a different pharmacy, please inform the nursing staff. The pharmacy   listed in the electronic medical record should be the one where you would like electronic prescriptions  to be sent.  Electronic prescriptions: In compliance with the [place] Strengthen Opioid Misuse Prevention (STOP) Act of 2017 (Session Law 2017-74/H243), effective February 27, 2018, all controlled substances must be electronically prescribed. Calling prescriptions to the pharmacy will cease to exist.  Prescription refills: Only during scheduled appointments. Applies to all prescriptions.  NOTE: The following applies primarily to controlled substances (Opioid* Pain Medications).   Type of encounter (visit): For patients receiving controlled substances, face-to-face visits are required. (Not an option or up to the patient.)  Patient's responsibilities: 1. Pain Pills: Bring all pain pills to every appointment (except for procedure appointments). 2. Pill Bottles: Bring pills in original pharmacy bottle. Always bring the newest bottle. Bring bottle, even if empty. 3. Medication refills: You are responsible for knowing and keeping track of what medications you take and those you need refilled. The day before your appointment: write a list of all prescriptions that need to be refilled. The day of the appointment: give the list to the admitting nurse. Prescriptions will be written only during appointments. No prescriptions will be written on procedure days. If you forget a medication: it will not be "Called in", "Faxed", or "electronically sent". You will need to get another appointment to get these prescribed. No early refills. Do not call asking to have your prescription filled early. 4. Prescription Accuracy: You are responsible for carefully inspecting your prescriptions before leaving our office. Have the discharge nurse carefully go over each prescription with you, before taking them home. Make sure that your name is accurately spelled, that your address is correct. Check the name and dose of your medication to make sure it is accurate. Check the number of pills, and the written instructions to  make sure they are clear and accurate. Make sure that you are given enough medication to last until your next medication refill appointment. 5. Taking Medication: Take medication as prescribed. When it comes to controlled substances, taking less pills or less frequently than prescribed is permitted and encouraged. Never take more pills than instructed. Never take medication more frequently than prescribed.  6. Inform other Doctors: Always inform, all of your healthcare providers, of all the medications you take. 7. Pain Medication from other Providers: You are not allowed to accept any additional pain medication from any other Doctor or Healthcare provider. There are two exceptions to this rule. (see below) In the event that you require additional pain medication, you are responsible for notifying us, as stated below. 8. Medication Agreement: You are responsible for carefully reading and following our Medication Agreement. This must be signed before receiving any prescriptions from our practice. Safely store a copy of your signed Agreement. Violations to the Agreement will result in no further prescriptions. (Additional copies of our Medication Agreement are available upon request.) 9. Laws, Rules, & Regulations: All patients are expected to follow all Federal and State Laws, Statutes, Rules, & Regulations. Ignorance of the Laws does not constitute a valid excuse.  10. Illegal drugs and Controlled Substances: The use of illegal substances (including, but not limited to marijuana and its derivatives) and/or the illegal use of any controlled substances is strictly prohibited. Violation of this rule may result in the immediate and permanent discontinuation of any and all prescriptions being written by our practice. The use of any illegal substances is prohibited. 11. Adopted CDC guidelines & recommendations: Target dosing levels will be at or   below 60 MME/day. Use of benzodiazepines** is not  recommended.  Exceptions: There are only two exceptions to the rule of not receiving pain medications from other Healthcare Providers. 1. Exception #1 (Emergencies): In the event of an emergency (i.e.: accident requiring emergency care), you are allowed to receive additional pain medication. However, you are responsible for: As soon as you are able, call our office [phone], at any time of the day or night, and leave a message stating your name, the date and nature of the emergency, and the name and dose of the medication prescribed. In the event that your call is answered by a member of our staff, make sure to document and save the date, time, and the name of the person that took your information.  2. Exception #2 (Planned Surgery): In the event that you are scheduled by another doctor or dentist to have any type of surgery or procedure, you are allowed (for a period no longer than 30 days), to receive additional pain medication, for the acute post-op pain. However, in this case, you are responsible for picking up a copy of our "Post-op Pain Management for Surgeons" handout, and giving it to your surgeon or dentist. This document is available at our office, and does not require an appointment to obtain it. Simply go to our office during business hours (Monday-Thursday from 8:00 AM to 4:00 PM) (Friday 8:00 AM to 12:00 Noon) or if you have a scheduled appointment with us, prior to your surgery, and ask for it by name. In addition, you will need to provide us with your name, name of your surgeon, type of surgery, and date of procedure or surgery.  *Opioid medications include: morphine, codeine, oxycodone, oxymorphone, hydrocodone, hydromorphone, meperidine, tramadol, tapentadol, buprenorphine, fentanyl, methadone. **Benzodiazepine medications include: diazepam (Valium), alprazolam (Xanax), clonazepam (Klonopine), lorazepam (Ativan), clorazepate (Tranxene), chlordiazepoxide (Librium), estazolam (Prosom),  oxazepam (Serax), temazepam (Restoril), triazolam (Halcion) (Last updated: 04/26/2017) ____________________________________________________________________________________________   ____________________________________________________________________________________________  Medication Recommendations and Reminders  Applies to: All patients receiving prescriptions (written and/or electronic).  Medication Rules & Regulations: These rules and regulations exist for your safety and that of others. They are not flexible and neither are we. Dismissing or ignoring them will be considered "non-compliance" with medication therapy, resulting in complete and irreversible termination of such therapy. (See document titled "Medication Rules" for more details.) In all conscience, because of safety reasons, we cannot continue providing a therapy where the patient does not follow instructions.  Pharmacy of record:   Definition: This is the pharmacy where your electronic prescriptions will be sent.   We do not endorse any particular pharmacy, however, we have experienced problems with [facility] not securing enough medication supply for the community.  We do not restrict you in your choice of pharmacy. However, once we write for your prescriptions, we will NOT be re-sending more prescriptions to fix restricted supply problems created by your pharmacy, or your insurance.   The pharmacy listed in the electronic medical record should be the one where you want electronic prescriptions to be sent.  If you choose to change pharmacy, simply notify our nursing staff.  Recommendations:  Keep all of your pain medications in a safe place, under lock and key, even if you live alone. We will NOT replace lost, stolen, or damaged medication.  After you fill your prescription, take 1 week's worth of pills and put them away in a safe place. You should keep a separate, properly labeled bottle for this purpose. The remainder    should be kept in the original bottle. Use this as your primary supply, until it runs out. Once it's gone, then you know that you have 1 week's worth of medicine, and it is time to come in for a prescription refill. If you do this correctly, it is unlikely that you will ever run out of medicine.  To make sure that the above recommendation works, it is very important that you make sure your medication refill appointments are scheduled at least 1 week before you run out of medicine. To do this in an effective manner, make sure that you do not leave the office without scheduling your next medication management appointment. Always ask the nursing staff to show you in your prescription , when your medication will be running out. Then arrange for the receptionist to get you a return appointment, at least 7 days before you run out of medicine. Do not wait until you have 1 or 2 pills left, to come in. This is very poor planning and does not take into consideration that we may need to cancel appointments due to bad weather, sickness, or emergencies affecting our staff.  DO NOT ACCEPT A "Partial Fill": If for any reason your pharmacy does not have enough pills/tablets to completely fill or refill your prescription, do not allow for a "partial fill". The law allows the pharmacy to complete that prescription within 72 hours, without requiring a new prescription. If they do not fill the rest of your prescription within those 72 hours, you will need a separate prescription to fill the remaining amount, which we will NOT provide. If the reason for the partial fill is your insurance, you will need to talk to the pharmacist about payment alternatives for the remaining tablets, but again, DO NOT ACCEPT A PARTIAL FILL, unless you can trust your pharmacist to obtain the remainder of the pills within 72 hours.  Prescription refills and/or changes in medication(s):   Prescription refills, and/or changes in dose or medication,  will be conducted only during scheduled medication management appointments. (Applies to both, written and electronic prescriptions.)  No refills on procedure days. No medication will be changed or started on procedure days. No changes, adjustments, and/or refills will be conducted on a procedure day. Doing so will interfere with the diagnostic portion of the procedure.  No phone refills. No medications will be "called into the pharmacy".  No Fax refills.  No weekend refills.  No Holliday refills.  No after hours refills.  Remember:  Business hours are:  Monday to Thursday 8:00 AM to 4:00 PM Provider's Schedule: Madelyne Millikan, MD - Appointments are:  Medication management: Monday and Wednesday 8:00 AM to 4:00 PM Procedure day: Tuesday and Thursday 7:30 AM to 4:00 PM Bilal Lateef, MD - Appointments are:  Medication management: Tuesday and Thursday 8:00 AM to 4:00 PM Procedure day: Monday and Wednesday 7:30 AM to 4:00 PM (Last update: 09/17/2019) ____________________________________________________________________________________________   ____________________________________________________________________________________________  CBD (cannabidiol) WARNING  Applicable to: All individuals currently taking or considering taking CBD (cannabidiol) and, more important, all patients taking opioid analgesic controlled substances (pain medication). (Example: oxycodone; oxymorphone; hydrocodone; hydromorphone; morphine; methadone; tramadol; tapentadol; fentanyl; buprenorphine; butorphanol; dextromethorphan; meperidine; codeine; etc.)  Legal status: CBD remains a Schedule I drug prohibited for any use. CBD is illegal with one exception. In the [place], CBD has a limited Food and Drug Administration (FDA) approval for the treatment of two specific types of epilepsy disorders. Only one CBD product has been approved by the   FDA for this purpose: "Epidiolex". FDA is aware that some  companies are marketing products containing cannabis and cannabis-derived compounds in ways that violate the Federal Food, Drug and Cosmetic Act (FD&C Act) and that may put the health and safety of consumers at risk. The FDA, a Federal agency, has not enforced the CBD status since 2018.   Legality: Some manufacturers ship CBD products nationally, which is illegal. Often such products are sold online and are therefore available throughout the country. CBD is openly sold in head shops and health food stores in some states where such sales have not been explicitly legalized. Selling unapproved products with unsubstantiated therapeutic claims is not only a violation of the law, but also can put patients at risk, as these products have not been proven to be safe or effective. Federal illegality makes it difficult to conduct research on CBD.  Reference: "FDA Regulation of Cannabis and Cannabis-Derived Products, Including Cannabidiol (CBD)" - https://www.fda.gov/news-events/public-health-focus/fda-regulation-cannabis-and-cannabis-derived-products-including-cannabidiol-cbd  Warning: CBD is not FDA approved and has not undergo the same manufacturing controls as prescription drugs.  This means that the purity and safety of available CBD may be questionable. Most of the time, despite manufacturer's claims, it is contaminated with THC (delta-9-tetrahydrocannabinol - the chemical in marijuana responsible for the "HIGH").  When this is the case, the THC contaminant will trigger a positive urine drug screen (UDS) test for Marijuana (carboxy-THC). Because a positive UDS for any illicit substance is a violation of our medication agreement, your opioid analgesics (pain medicine) may be permanently discontinued.  MORE ABOUT CBD  General Information: CBD  is a derivative of the Marijuana (cannabis sativa) plant discovered in 1940. It is one of the 113 identified substances found in Marijuana. It accounts for up to 40% of the  plant's extract. As of 2018, preliminary clinical studies on CBD included research for the treatment of anxiety, movement disorders, and pain. CBD is available and consumed in multiple forms, including inhalation of smoke or vapor, as an aerosol spray, and by mouth. It may be supplied as an oil containing CBD, capsules, dried cannabis, or as a liquid solution. CBD is thought not to be as psychoactive as THC (delta-9-tetrahydrocannabinol - the chemical in marijuana responsible for the "HIGH"). Studies suggest that CBD may interact with different biological target receptors in the body, including cannabinoid and other neurotransmitter receptors. As of 2018 the mechanism of action for its biological effects has not been determined.  Side-effects  Adverse reactions: Dry mouth, diarrhea, decreased appetite, fatigue, drowsiness, malaise, weakness, sleep disturbances, and others.  Drug interactions: CBC may interact with other medications such as blood-thinners. (Last update: 10/04/2019) ____________________________________________________________________________________________    

## 2019-10-08 DIAGNOSIS — S8012XD Contusion of left lower leg, subsequent encounter: Secondary | ICD-10-CM | POA: Diagnosis not present

## 2019-10-23 ENCOUNTER — Other Ambulatory Visit: Payer: Self-pay | Admitting: Pain Medicine

## 2019-10-23 DIAGNOSIS — M792 Neuralgia and neuritis, unspecified: Secondary | ICD-10-CM

## 2019-10-31 ENCOUNTER — Ambulatory Visit (INDEPENDENT_AMBULATORY_CARE_PROVIDER_SITE_OTHER): Payer: Medicare Other | Admitting: *Deleted

## 2019-10-31 DIAGNOSIS — I495 Sick sinus syndrome: Secondary | ICD-10-CM | POA: Diagnosis not present

## 2019-11-01 LAB — CUP PACEART REMOTE DEVICE CHECK
Battery Remaining Longevity: 139 mo
Battery Remaining Percentage: 95.5 %
Battery Voltage: 3.04 V
Brady Statistic RV Percent Paced: 9.8 %
Date Time Interrogation Session: 20210903020014
Implantable Lead Implant Date: 20090317
Implantable Lead Implant Date: 20090317
Implantable Lead Location: 753859
Implantable Lead Location: 753860
Implantable Pulse Generator Implant Date: 20210305
Lead Channel Impedance Value: 510 Ohm
Lead Channel Pacing Threshold Amplitude: 0.5 V
Lead Channel Pacing Threshold Pulse Width: 0.4 ms
Lead Channel Sensing Intrinsic Amplitude: 12 mV
Lead Channel Setting Pacing Amplitude: 2.5 V
Lead Channel Setting Pacing Pulse Width: 0.4 ms
Lead Channel Setting Sensing Sensitivity: 2 mV
Pulse Gen Model: 2272
Pulse Gen Serial Number: 3802091

## 2019-11-04 NOTE — Progress Notes (Signed)
Remote pacemaker transmission.   

## 2019-11-20 ENCOUNTER — Ambulatory Visit (INDEPENDENT_AMBULATORY_CARE_PROVIDER_SITE_OTHER): Payer: Medicare Other | Admitting: Cardiovascular Disease

## 2019-11-20 ENCOUNTER — Other Ambulatory Visit: Payer: Self-pay

## 2019-11-20 ENCOUNTER — Encounter: Payer: Self-pay | Admitting: Cardiovascular Disease

## 2019-11-20 VITALS — BP 164/100 | HR 86 | Ht 61.0 in | Wt 209.0 lb

## 2019-11-20 DIAGNOSIS — I10 Essential (primary) hypertension: Secondary | ICD-10-CM

## 2019-11-20 DIAGNOSIS — I4821 Permanent atrial fibrillation: Secondary | ICD-10-CM | POA: Diagnosis not present

## 2019-11-20 NOTE — Patient Instructions (Signed)
Medication Instructions:  Your physician recommends that you continue on your current medications as directed. Please refer to the Current Medication list given to you today.  *If you need a refill on your cardiac medications before your next appointment, please call your pharmacy*   Lab Work: None ordered If you have labs (blood work) drawn today and your tests are completely normal, you will receive your results only by: Marland Kitchen MyChart Message (if you have MyChart) OR . A paper copy in the mail If you have any lab test that is abnormal or we need to change your treatment, we will call you to review the results.   Testing/Procedures: None ordered   Follow-Up: At [facility], you and your health needs are our priority.  As part of our continuing mission to provide you with exceptional heart care, we have created designated Provider Care Teams.  These Care Teams include your primary Cardiologist (physician) and Advanced Practice Providers (APPs -  Physician Assistants and Nurse Practitioners) who all work together to provide you with the care you need, when you need it.  We recommend signing up for the patient portal called "MyChart".  Sign up information is provided on this After Visit Summary.  MyChart is used to connect with patients for Virtual Visits (Telemedicine).  Patients are able to view lab/test results, encounter notes, upcoming appointments, etc.  Non-urgent messages can be sent to your provider as well.   To learn more about what you can do with MyChart, go to [email or web address].    Your next appointment:   6 month(s)  The format for your next appointment:   In Person  Provider:   You may see Dr. Fletcher Anon or one of the following Advanced Practice Providers on your designated Care Team:    Murray Hodgkins, NP  Christell Faith, PA-C  Marrianne Mood, PA-C  Cadence Kathlen Mody, [place]    Other Instructions N/A

## 2019-11-20 NOTE — Progress Notes (Signed)
Cardiology Office Note   Date:  11/20/2019   ID:  [name], DOB 12-Jan-1941, MRN [id number]  PCP:  Bryson Corona, NP  Cardiologist:   Kathlyn Sacramento, MD   Chief Complaint  Patient presents with   Follow-up    6 Months follow up. Medications verbally reviewed with patient.      History of Present Illness: [name] is a 79 y.o. female who is here today for follow-up visit regarding chronic atrial fibrillation and permanent pacemaker placement followed by Dr. Caryl Comes.  Other medical issues include essential hypertension and gout. Atrial fibrillation is being treated with rate control and anticoagulation. She underwent pacemaker generator replacement in March by Dr. Caryl Comes. She has been doing well with no recent chest pain, shortness of breath or palpitations.  No side effects with Eliquis.  Past Medical History:  Diagnosis Date   Anxiety    Aortic insufficiency    Trivial   Arthritis    Asthma    Bronchitis    Bursitis, hip    Claustrophobia    Claustrophobia    Dysrhythmia    a fib   GERD (gastroesophageal reflux disease)    history of   Gout    Hyperlipidemia    Hypertension    Hypothyroidism    Mitral regurgitation    Obesity    Panic attacks    Paroxysmal atrial fibrillation (HCC)    PTSD (post-traumatic stress disorder)    Rhinitis    Sick sinus syndrome (HCC)    Sleep apnea    was ordered CPAP, but never fitted for one years ago, Dr has retired    Past Surgical History:  Procedure Laterality Date   [place] N/A 08/03/2015   Procedure: [place];  Surgeon: Alden Hipp, MD;  Location: [facility];  Service: Gynecology;  Laterality: N/A;  Patient has an ICD    INSERT / REPLACE / REMOVE PACEMAKER     PACEMAKER INSERTION     Status post insertion   PPM GENERATOR CHANGEOUT N/A 05/02/2019   Procedure: PPM GENERATOR  CHANGEOUT;  Surgeon: Deboraha Sprang, MD;  Location: [facility];  Service: Cardiovascular;  Laterality: N/A;   ROTATOR CUFF REPAIR     left   Status post child birth x1     TONSILLECTOMY     TOTAL HIP ARTHROPLASTY Left 12/22/2015   Procedure: TOTAL HIP ARTHROPLASTY;  Surgeon: Dereck Leep, MD;  Location: [facility];  Service: Orthopedics;  Laterality: Left;   TOTAL HIP REVISION Left 03/01/2018   Procedure: TOTAL HIP REVISION;  Surgeon: Dereck Leep, MD;  Location: [facility];  Service: Orthopedics;  Laterality: Left;     Current Outpatient Medications  Medication Sig Dispense Refill   acetaminophen (TYLENOL) 500 MG tablet Take 1,000 mg by mouth every 6 (six) hours as needed for headache.     allopurinol (ZYLOPRIM) 100 MG tablet Take 100 mg by mouth 2 (two) times daily.      cyclobenzaprine (FLEXERIL) 10 MG tablet cyclobenzaprine 10 mg tablet     cyclobenzaprine (FLEXERIL) 5 MG tablet Take 1 tablet (5 mg total) by mouth 2 (two) times daily as needed for muscle spasms. 60 tablet 5   ELIQUIS 5 MG TABS tablet TAKE 1 TABLET TWICE A DAY 180 tablet 1   liothyronine (CYTOMEL) 5 MCG tablet Take 15 mcg by mouth daily.      metoprolol tartrate (LOPRESSOR) 50  MG tablet Take 1.5 tablets (75 mg total) by mouth 2 (two) times daily. 270 tablet 3   oxyCODONE (OXY IR/ROXICODONE) 5 MG immediate release tablet Take 1 tablet (5 mg total) by mouth every 6 (six) hours as needed for severe pain. Must last 30 days 120 tablet 0   [START ON 12/06/2019] oxyCODONE (OXY IR/ROXICODONE) 5 MG immediate release tablet Take 1 tablet (5 mg total) by mouth every 6 (six) hours as needed for severe pain. Must last 30 days 120 tablet 0   pregabalin (LYRICA) 25 MG capsule Take 1 capsule (25 mg total) by mouth 2 (two) times daily. 60 capsule 5   spironolactone (ALDACTONE) 100 MG tablet Take 100 mg by mouth daily.     thyroid (ARMOUR) 60 MG tablet Take 60 mg by mouth daily before breakfast.      oxyCODONE  (OXY IR/ROXICODONE) 5 MG immediate release tablet Take 1 tablet (5 mg total) by mouth every 6 (six) hours as needed for severe pain. Must last 30 days 120 tablet 0   No current facility-administered medications for this visit.    Allergies:   Contrast media [iodinated diagnostic agents]    Social History:  The patient  reports that she has never smoked. She has never used smokeless tobacco. She reports that she does not drink alcohol and does not use drugs.   Family History:  The patient's family history includes Alzheimer's disease in her father; Arthritis in her sister; Cancer in her brother and maternal grandfather; Heart disease in her mother; Hypertension in her sister; Stroke in her mother.    ROS:  Please see the history of present illness.   Otherwise, review of systems are positive for none.   All other systems are reviewed and negative.    PHYSICAL EXAM: VS:  BP (!) 164/100 (BP Location: Left Arm, Patient Position: Sitting, Cuff Size: Normal)    Pulse 86    Ht 5\' 1"  (1.549 m)    Wt 209 lb (94.8 kg)    SpO2 95%    BMI 39.49 kg/m  , BMI Body mass index is 39.49 kg/m. GEN: Well nourished, well developed, in no acute distress  HEENT: normal  Neck: no JVD, carotid bruits, or masses Cardiac: Irregularly irregular; no murmurs, rubs, or gallops,no edema  Respiratory:  clear to auscultation bilaterally, normal work of breathing GI: soft, nontender, nondistended, + BS MS: no deformity or atrophy  Skin: warm and dry, no rash Neuro:  Strength and sensation are intact Psych: euthymic mood, full affect   EKG:  EKG is ordered today. The ekg ordered today demonstrates atrial fibrillation with a ventricular rate of 86 bpm.   Recent Labs: 04/22/2019: BUN 20; Creatinine, Ser 1.11; Hemoglobin 14.3; Platelets 169; Potassium 4.4; Sodium 140    Lipid Panel    Component Value Date/Time   CHOL 208 (H) 11/07/2013 1424   TRIG 142.0 11/07/2013 1424   HDL 50.00 11/07/2013 1424   CHOLHDL 4  11/07/2013 1424   VLDL 28.4 11/07/2013 1424   LDLCALC 130 (H) 11/07/2013 1424      Wt Readings from Last 3 Encounters:  11/20/19 209 lb (94.8 kg)  10/06/19 205 lb (93 kg)  07/29/19 208 lb (94.3 kg)       No flowsheet data found.    ASSESSMENT AND PLAN:  1.  Chronic atrial fibrillation: Doing well with rate control and long-term anticoagulation with warfarin.  She is going to have comprehensive labs with her primary care physician next week and  I asked her to forward a copy to [place].  2.  Status post pacemaker placement: Followed by Dr. Caryl Comes.  3.  Essential hypertension: Blood pressure is elevated today.  I rechecked her blood pressure with a large cuff and it was 140/80.  I made no changes in her medications.    Disposition:   FU with me in 6 months  Signed,  Kathlyn Sacramento, MD  11/20/2019 4:12 PM    [place]

## 2019-11-25 ENCOUNTER — Encounter: Payer: Self-pay | Admitting: Cardiovascular Disease

## 2019-11-25 DIAGNOSIS — E559 Vitamin D deficiency, unspecified: Secondary | ICD-10-CM | POA: Diagnosis not present

## 2019-11-25 DIAGNOSIS — R799 Abnormal finding of blood chemistry, unspecified: Secondary | ICD-10-CM | POA: Diagnosis not present

## 2019-11-25 DIAGNOSIS — R5382 Chronic fatigue, unspecified: Secondary | ICD-10-CM | POA: Diagnosis not present

## 2019-11-25 DIAGNOSIS — E2740 Unspecified adrenocortical insufficiency: Secondary | ICD-10-CM | POA: Diagnosis not present

## 2019-11-25 DIAGNOSIS — E039 Hypothyroidism, unspecified: Secondary | ICD-10-CM | POA: Diagnosis not present

## 2019-11-25 DIAGNOSIS — I509 Heart failure, unspecified: Secondary | ICD-10-CM | POA: Diagnosis not present

## 2019-11-25 DIAGNOSIS — R7982 Elevated C-reactive protein (CRP): Secondary | ICD-10-CM | POA: Diagnosis not present

## 2019-11-25 DIAGNOSIS — M109 Gout, unspecified: Secondary | ICD-10-CM | POA: Diagnosis not present

## 2019-12-22 ENCOUNTER — Ambulatory Visit: Payer: Medicare Other | Attending: Pain Medicine | Admitting: Pain Medicine

## 2019-12-22 ENCOUNTER — Other Ambulatory Visit: Payer: Self-pay

## 2019-12-22 ENCOUNTER — Encounter: Payer: Self-pay | Admitting: Pain Medicine

## 2019-12-22 VITALS — BP 142/78 | HR 78 | Temp 96.6°F | Ht 61.0 in | Wt 208.0 lb

## 2019-12-22 DIAGNOSIS — M25552 Pain in left hip: Secondary | ICD-10-CM | POA: Diagnosis not present

## 2019-12-22 DIAGNOSIS — Z79899 Other long term (current) drug therapy: Secondary | ICD-10-CM | POA: Diagnosis not present

## 2019-12-22 DIAGNOSIS — M25551 Pain in right hip: Secondary | ICD-10-CM | POA: Diagnosis not present

## 2019-12-22 DIAGNOSIS — M7918 Myalgia, other site: Secondary | ICD-10-CM | POA: Diagnosis not present

## 2019-12-22 DIAGNOSIS — M62838 Other muscle spasm: Secondary | ICD-10-CM

## 2019-12-22 DIAGNOSIS — Z96642 Presence of left artificial hip joint: Secondary | ICD-10-CM | POA: Insufficient documentation

## 2019-12-22 DIAGNOSIS — G8929 Other chronic pain: Secondary | ICD-10-CM | POA: Diagnosis not present

## 2019-12-22 DIAGNOSIS — M792 Neuralgia and neuritis, unspecified: Secondary | ICD-10-CM | POA: Diagnosis not present

## 2019-12-22 DIAGNOSIS — G894 Chronic pain syndrome: Secondary | ICD-10-CM | POA: Insufficient documentation

## 2019-12-22 MED ORDER — OXYCODONE HCL 5 MG PO TABS: 5.0000 mg | ORAL_TABLET | Freq: Four times a day (QID) | ORAL | 0 refills | Status: DC | PRN

## 2019-12-22 NOTE — Patient Instructions (Signed)
____________________________________________________________________________________________  CBD (cannabidiol) WARNING  Applicable to: All individuals currently taking or considering taking CBD (cannabidiol) and, more important, all patients taking opioid analgesic controlled substances (pain medication). (Example: oxycodone; oxymorphone; hydrocodone; hydromorphone; morphine; methadone; tramadol; tapentadol; fentanyl; buprenorphine; butorphanol; dextromethorphan; meperidine; codeine; etc.)  Legal status: CBD remains a Schedule I drug prohibited for any use. CBD is illegal with one exception. In the [place], CBD has a limited Food and Drug Administration (FDA) approval for the treatment of two specific types of epilepsy disorders. Only one CBD product has been approved by the FDA for this purpose: "Epidiolex". FDA is aware that some companies are marketing products containing cannabis and cannabis-derived compounds in ways that violate the Federal Food, Drug and Cosmetic Act (FD&C Act) and that may put the health and safety of consumers at risk. The FDA, a Federal agency, has not enforced the CBD status since 2018.   Legality: Some manufacturers ship CBD products nationally, which is illegal. Often such products are sold online and are therefore available throughout the country. CBD is openly sold in head shops and health food stores in some states where such sales have not been explicitly legalized. Selling unapproved products with unsubstantiated therapeutic claims is not only a violation of the law, but also can put patients at risk, as these products have not been proven to be safe or effective. Federal illegality makes it difficult to conduct research on CBD.  Reference: "FDA Regulation of Cannabis and Cannabis-Derived Products, Including Cannabidiol (CBD)" -  https://www.fda.gov/news-events/public-health-focus/fda-regulation-cannabis-and-cannabis-derived-products-including-cannabidiol-cbd  Warning: CBD is not FDA approved and has not undergo the same manufacturing controls as prescription drugs.  This means that the purity and safety of available CBD may be questionable. Most of the time, despite manufacturer's claims, it is contaminated with THC (delta-9-tetrahydrocannabinol - the chemical in marijuana responsible for the "HIGH").  When this is the case, the THC contaminant will trigger a positive urine drug screen (UDS) test for Marijuana (carboxy-THC). Because a positive UDS for any illicit substance is a violation of our medication agreement, your opioid analgesics (pain medicine) may be permanently discontinued.  MORE ABOUT CBD  General Information: CBD  is a derivative of the Marijuana (cannabis sativa) plant discovered in 1940. It is one of the 113 identified substances found in Marijuana. It accounts for up to 40% of the plant's extract. As of 2018, preliminary clinical studies on CBD included research for the treatment of anxiety, movement disorders, and pain. CBD is available and consumed in multiple forms, including inhalation of smoke or vapor, as an aerosol spray, and by mouth. It may be supplied as an oil containing CBD, capsules, dried cannabis, or as a liquid solution. CBD is thought not to be as psychoactive as THC (delta-9-tetrahydrocannabinol - the chemical in marijuana responsible for the "HIGH"). Studies suggest that CBD may interact with different biological target receptors in the body, including cannabinoid and other neurotransmitter receptors. As of 2018 the mechanism of action for its biological effects has not been determined.  Side-effects  Adverse reactions: Dry mouth, diarrhea, decreased appetite, fatigue, drowsiness, malaise, weakness, sleep disturbances, and others.  Drug interactions: CBC may interact with other medications  such as blood-thinners. (Last update: 10/04/2019) ____________________________________________________________________________________________   ____________________________________________________________________________________________  Medication Rules  Purpose: To inform patients, and their family members, of our rules and regulations.  Applies to: All patients receiving prescriptions (written or electronic).  Pharmacy of record: Pharmacy where electronic prescriptions will be sent. If written prescriptions are taken to a different pharmacy, please inform   the nursing staff. The pharmacy listed in the electronic medical record should be the one where you would like electronic prescriptions to be sent.  Electronic prescriptions: In compliance with the [place] Strengthen Opioid Misuse Prevention (STOP) Act of 2017 (Session Law 2017-74/H243), effective February 27, 2018, all controlled substances must be electronically prescribed. Calling prescriptions to the pharmacy will cease to exist.  Prescription refills: Only during scheduled appointments. Applies to all prescriptions.  NOTE: The following applies primarily to controlled substances (Opioid* Pain Medications).   Type of encounter (visit): For patients receiving controlled substances, face-to-face visits are required. (Not an option or up to the patient.)  Patient's responsibilities: 1. Pain Pills: Bring all pain pills to every appointment (except for procedure appointments). 2. Pill Bottles: Bring pills in original pharmacy bottle. Always bring the newest bottle. Bring bottle, even if empty. 3. Medication refills: You are responsible for knowing and keeping track of what medications you take and those you need refilled. The day before your appointment: write a list of all prescriptions that need to be refilled. The day of the appointment: give the list to the admitting nurse. Prescriptions will be written only during  appointments. No prescriptions will be written on procedure days. If you forget a medication: it will not be "Called in", "Faxed", or "electronically sent". You will need to get another appointment to get these prescribed. No early refills. Do not call asking to have your prescription filled early. 4. Prescription Accuracy: You are responsible for carefully inspecting your prescriptions before leaving our office. Have the discharge nurse carefully go over each prescription with you, before taking them home. Make sure that your name is accurately spelled, that your address is correct. Check the name and dose of your medication to make sure it is accurate. Check the number of pills, and the written instructions to make sure they are clear and accurate. Make sure that you are given enough medication to last until your next medication refill appointment. 5. Taking Medication: Take medication as prescribed. When it comes to controlled substances, taking less pills or less frequently than prescribed is permitted and encouraged. Never take more pills than instructed. Never take medication more frequently than prescribed.  6. Inform other Doctors: Always inform, all of your healthcare providers, of all the medications you take. 7. Pain Medication from other Providers: You are not allowed to accept any additional pain medication from any other Doctor or Healthcare provider. There are two exceptions to this rule. (see below) In the event that you require additional pain medication, you are responsible for notifying us, as stated below. 8. Medication Agreement: You are responsible for carefully reading and following our Medication Agreement. This must be signed before receiving any prescriptions from our practice. Safely store a copy of your signed Agreement. Violations to the Agreement will result in no further prescriptions. (Additional copies of our Medication Agreement are available upon request.) 9. Laws, Rules,  & Regulations: All patients are expected to follow all Federal and State Laws, Statutes, Rules, & Regulations. Ignorance of the Laws does not constitute a valid excuse.  10. Illegal drugs and Controlled Substances: The use of illegal substances (including, but not limited to marijuana and its derivatives) and/or the illegal use of any controlled substances is strictly prohibited. Violation of this rule may result in the immediate and permanent discontinuation of any and all prescriptions being written by our practice. The use of any illegal substances is prohibited. 11. Adopted CDC guidelines & recommendations: Target dosing   levels will be at or below 60 MME/day. Use of benzodiazepines** is not recommended.  Exceptions: There are only two exceptions to the rule of not receiving pain medications from other Healthcare Providers. 1. Exception #1 (Emergencies): In the event of an emergency (i.e.: accident requiring emergency care), you are allowed to receive additional pain medication. However, you are responsible for: As soon as you are able, call our office [phone], at any time of the day or night, and leave a message stating your name, the date and nature of the emergency, and the name and dose of the medication prescribed. In the event that your call is answered by a member of our staff, make sure to document and save the date, time, and the name of the person that took your information.  2. Exception #2 (Planned Surgery): In the event that you are scheduled by another doctor or dentist to have any type of surgery or procedure, you are allowed (for a period no longer than 30 days), to receive additional pain medication, for the acute post-op pain. However, in this case, you are responsible for picking up a copy of our "Post-op Pain Management for Surgeons" handout, and giving it to your surgeon or dentist. This document is available at our office, and does not require an appointment to obtain it. Simply  go to our office during business hours (Monday-Thursday from 8:00 AM to 4:00 PM) (Friday 8:00 AM to 12:00 Noon) or if you have a scheduled appointment with us, prior to your surgery, and ask for it by name. In addition, you are responsible for: calling our office [phone], at any time of the day or night, and leaving a message stating your name, name of your surgeon, type of surgery, and date of procedure or surgery. Failure to comply with your responsibilities may result in termination of therapy involving the controlled substances.  *Opioid medications include: morphine, codeine, oxycodone, oxymorphone, hydrocodone, hydromorphone, meperidine, tramadol, tapentadol, buprenorphine, fentanyl, methadone. **Benzodiazepine medications include: diazepam (Valium), alprazolam (Xanax), clonazepam (Klonopine), lorazepam (Ativan), clorazepate (Tranxene), chlordiazepoxide (Librium), estazolam (Prosom), oxazepam (Serax), temazepam (Restoril), triazolam (Halcion) (Last updated: 11/04/2019) ____________________________________________________________________________________________   ____________________________________________________________________________________________  Medication Recommendations and Reminders  Applies to: All patients receiving prescriptions (written and/or electronic).  Medication Rules & Regulations: These rules and regulations exist for your safety and that of others. They are not flexible and neither are we. Dismissing or ignoring them will be considered "non-compliance" with medication therapy, resulting in complete and irreversible termination of such therapy. (See document titled "Medication Rules" for more details.) In all conscience, because of safety reasons, we cannot continue providing a therapy where the patient does not follow instructions.  Pharmacy of record:   Definition: This is the pharmacy where your electronic prescriptions will be sent.   We do not endorse any  particular pharmacy, however, we have experienced problems with [facility] not securing enough medication supply for the community.  We do not restrict you in your choice of pharmacy. However, once we write for your prescriptions, we will NOT be re-sending more prescriptions to fix restricted supply problems created by your pharmacy, or your insurance.   The pharmacy listed in the electronic medical record should be the one where you want electronic prescriptions to be sent.  If you choose to change pharmacy, simply notify our nursing staff.  Recommendations:  Keep all of your pain medications in a safe place, under lock and key, even if you live alone. We will NOT replace lost, stolen, or   damaged medication.  After you fill your prescription, take 1 week's worth of pills and put them away in a safe place. You should keep a separate, properly labeled bottle for this purpose. The remainder should be kept in the original bottle. Use this as your primary supply, until it runs out. Once it's gone, then you know that you have 1 week's worth of medicine, and it is time to come in for a prescription refill. If you do this correctly, it is unlikely that you will ever run out of medicine.  To make sure that the above recommendation works, it is very important that you make sure your medication refill appointments are scheduled at least 1 week before you run out of medicine. To do this in an effective manner, make sure that you do not leave the office without scheduling your next medication management appointment. Always ask the nursing staff to show you in your prescription , when your medication will be running out. Then arrange for the receptionist to get you a return appointment, at least 7 days before you run out of medicine. Do not wait until you have 1 or 2 pills left, to come in. This is very poor planning and does not take into consideration that we may need to cancel appointments due to bad weather,  sickness, or emergencies affecting our staff.  DO NOT ACCEPT A "Partial Fill": If for any reason your pharmacy does not have enough pills/tablets to completely fill or refill your prescription, do not allow for a "partial fill". The law allows the pharmacy to complete that prescription within 72 hours, without requiring a new prescription. If they do not fill the rest of your prescription within those 72 hours, you will need a separate prescription to fill the remaining amount, which we will NOT provide. If the reason for the partial fill is your insurance, you will need to talk to the pharmacist about payment alternatives for the remaining tablets, but again, DO NOT ACCEPT A PARTIAL FILL, unless you can trust your pharmacist to obtain the remainder of the pills within 72 hours.  Prescription refills and/or changes in medication(s):   Prescription refills, and/or changes in dose or medication, will be conducted only during scheduled medication management appointments. (Applies to both, written and electronic prescriptions.)  No refills on procedure days. No medication will be changed or started on procedure days. No changes, adjustments, and/or refills will be conducted on a procedure day. Doing so will interfere with the diagnostic portion of the procedure.  No phone refills. No medications will be "called into the pharmacy".  No Fax refills.  No weekend refills.  No [place] refills.  No after hours refills.  Remember:  Business hours are:  Monday to Thursday 8:00 AM to 4:00 PM Provider's Schedule: Kanijah Groseclose, MD - Appointments are:  Medication management: Monday and Wednesday 8:00 AM to 4:00 PM Procedure day: Tuesday and Thursday 7:30 AM to 4:00 PM Bilal Lateef, MD - Appointments are:  Medication management: Tuesday and Thursday 8:00 AM to 4:00 PM Procedure day: Monday and Wednesday 7:30 AM to 4:00 PM (Last update:  09/17/2019) ____________________________________________________________________________________________    

## 2019-12-22 NOTE — Progress Notes (Signed)
Nursing Pain Medication Assessment:  Safety precautions to be maintained throughout the outpatient stay will include: orient to surroundings, keep bed in low position, maintain call bell within reach at all times, provide assistance with transfer out of bed and ambulation.  Medication Inspection Compliance: Pill count conducted under aseptic conditions, in front of the patient. Neither the pills nor the bottle was removed from the patient's sight at any time. Once count was completed pills were immediately returned to the patient in their original bottle.  Medication: Oxycodone IR Pill/Patch Count: 10 of 120 pills remain Pill/Patch Appearance: Markings consistent with prescribed medication Bottle Appearance: Standard pharmacy container. Clearly labeled. Filled Date: 42 / 101 / 21 Last Medication intake:  YesterdaySafety precautions to be maintained throughout the outpatient stay will include: orient to surroundings, keep bed in low position, maintain call bell within reach at all times, provide assistance with transfer out of bed and ambulation.

## 2019-12-22 NOTE — Progress Notes (Signed)
PROVIDER NOTE: Information contained herein reflects review and annotations entered in association with encounter. Interpretation of such information and data should be left to medically-trained personnel. Information provided to patient can be located elsewhere in the medical record under "Patient Instructions". Document created using STT-dictation technology, any transcriptional errors that may result from process are unintentional.    Patient: [name]  Service Category: E/M  Provider: Gaspar Cola, MD  DOB: October 02, 1940  DOS: 12/22/2019  Specialty: Interventional Pain Management  MRN: [id number]  Setting: Ambulatory outpatient  PCP: Bryson Corona, NP  Type: Established Patient    Referring Provider: Bryson Corona, NP  Location: Office  Delivery: Face-to-face     HPI  [name], a 79 y.o. year old female, is here today because of her Chronic pain syndrome [G89.4]. [name] primary complain today is Hip Pain Last encounter: My last encounter with her was on 10/23/2019. Pertinent problems: 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 Pain Assessment: Severity of Chronic pain is reported as a 3 /10. Location: Hip Right/Denies. Onset: More than a month ago. Quality: Aching, Burning. Timing: Constant. Modifying factor(s): meds, heating pad. Vitals:  height is 5' 1"  (1.549 m) and  weight is 208 lb (94.3 kg). Her temperature is 96.6 F (35.9 C) (abnormal). Her blood pressure is 142/78 (abnormal) and her pulse is 78. Her oxygen saturation is 100%.   Reason for encounter: medication management.  The patient indicates doing well with the current medication regimen. No adverse reactions or side effects reported to the medications.  RTCB: 04/04/2020 Nonopioids transfer 12/22/2019: Lyrica and Flexeril.  (The patient indicates that she had already informed her PCP and she has already taken over these medications.)  Pharmacotherapy Assessment   Analgesic: Oxycodone IR 5 mg 1 tablet p.o. every 6 hours (20 mg/day of oxycodone)  MME/day:79m/day.   Monitoring: [place] PMP: PDMP reviewed during this encounter.       Pharmacotherapy: No side-effects or adverse reactions reported. Compliance: No problems identified. Effectiveness: Clinically acceptable.  BChauncey Fischer RN  12/22/2019 10:32 AM  Sign when Signing Visit Nursing Pain Medication Assessment:  Safety precautions to be maintained throughout the outpatient stay will include: orient to surroundings, keep bed in low position, maintain call bell within reach at all times, provide assistance with transfer out of bed and ambulation.  Medication Inspection Compliance: Pill count conducted under aseptic conditions, in front of the patient. Neither the pills nor the bottle was removed from the patient's sight at any time. Once count was completed pills were immediately returned to the patient in their original bottle.  Medication: Oxycodone IR Pill/Patch Count: 10 of 120 pills remain Pill/Patch Appearance: Markings consistent with prescribed medication Bottle Appearance: Standard pharmacy container. Clearly labeled. Filled Date: 959/ 215/ 21 Last Medication intake:  YesterdaySafety precautions to be maintained throughout the outpatient stay will include: orient to surroundings, keep bed in low position, maintain call bell within  reach at all times, provide  assistance with transfer out of bed and ambulation.     UDS:  Summary  Date Value Ref Range Status  07/17/2019 Note  Final    Comment:    ==================================================================== [employer] (MW) ==================================================================== Test                             Result       Flag       Units Drug Present and Declared for Prescription Verification   Oxycodone                      1677         EXPECTED   ng/mg creat   Oxymorphone                    1013         EXPECTED   ng/mg creat   Noroxycodone                   2315         EXPECTED   ng/mg creat   Noroxymorphone                 254          EXPECTED   ng/mg creat    Sources of oxycodone are scheduled prescription medications.    Oxymorphone, noroxycodone, and noroxymorphone are expected    metabolites of oxycodone. Oxymorphone is also available as a    scheduled prescription medication. Drug Absent but Declared for Prescription Verification   Lorazepam                      Not Detected UNEXPECTED ng/mg creat ==================================================================== Test                      Result    Flag   Units      Ref Range   Creatinine              194              mg/dL      >=20 ==================================================================== Declared Medications:  The flagging and interpretation on this report are based on the  following declared medications.  Unexpected results may arise from  inaccuracies in the declared medications.  **Note: The testing scope of this panel includes these medications:  Lorazepam (Ativan)  Oxycodone  **Note: The testing scope of this panel does not include the  following reported medications:  Acetaminophen  Allopurinol  Apixaban  Cyclobenzaprine  Liothyronine (Cytomel)  Metoprolol (Lopressor)  Pregabalin (Lyrica)  Spironolactone  Thyroid: Liothyronine/Levothyroxine  ([place]) ==================================================================== For clinical consultation, please call [phone]. ====================================================================      ROS  Constitutional: Denies any fever or chills Gastrointestinal: No reported hemesis, hematochezia, vomiting, or acute GI distress Musculoskeletal: Denies any acute onset joint swelling, redness, loss of ROM, or weakness Neurological: No reported episodes of acute onset apraxia, aphasia, dysarthria, agnosia, amnesia, paralysis, loss of coordination, or loss of consciousness  Medication Review  acetaminophen, allopurinol, apixaban, cyclobenzaprine, liothyronine, metoprolol tartrate, oxyCODONE, pregabalin, spironolactone, and thyroid  History Review  Allergy: Ms. Criado is allergic to contrast media [iodinated diagnostic agents]. Drug: Ms. Dlouhy  reports no history of drug use. Alcohol:  reports no history of alcohol use. Tobacco:  reports that she has never smoked. She has never used smokeless tobacco. Social: Ms. Leven  reports that she has never smoked. She has never used smokeless tobacco. She reports that she does not drink alcohol and does not use drugs. Medical:  has a past medical history of Anxiety, Aortic insufficiency, Arthritis, Asthma, Bronchitis, Bursitis, hip, Claustrophobia, Claustrophobia, Dysrhythmia, GERD (gastroesophageal reflux disease), Gout, Hyperlipidemia, Hypertension, Hypothyroidism, Mitral regurgitation, Obesity, Panic attacks, Paroxysmal atrial fibrillation ([place]), PTSD (post-traumatic stress disorder), Rhinitis, Sick sinus syndrome ([place]), and Sleep apnea. Surgical: Ms. Stumpo  has a past surgical history that includes Tonsillectomy; Pacemaker insertion; Status post child birth x1; Rotator cuff repair; Dilatation & curettage/hysteroscopy with myosure (N/A, 08/03/2015); Insert / replace / remove pacemaker; Total hip arthroplasty (Left, 12/22/2015); Cardiac  catheterization; Total hip revision (Left, 03/01/2018); and PPM GENERATOR CHANGEOUT (N/A, 05/02/2019). Family: family history includes Alzheimer's disease in her father; Arthritis in her sister; Cancer in her brother and maternal grandfather; Heart disease in her mother; Hypertension in her sister; Stroke in her mother.  Laboratory Chemistry Profile   Renal Lab Results  Component Value Date   BUN 20 04/22/2019   CREATININE 1.11 (H) 04/22/2019   BCR 18 04/22/2019   GFR 53.86 (L) 11/07/2013   GFRAA 55 (L) 04/22/2019   GFRNONAA 47 (L) 04/22/2019     Hepatic Lab Results  Component Value Date   AST 21 11/19/2018   ALT 14 11/19/2018   ALBUMIN 3.7 11/19/2018   ALKPHOS 57 11/19/2018     Electrolytes Lab Results  Component Value Date   NA 140 04/22/2019   K 4.4 04/22/2019   CL 102 04/22/2019   CALCIUM 9.7 04/22/2019   MG 1.5 (L) 11/19/2018     Bone Lab Results  Component Value Date   VD25OH 42.4 11/19/2018   25OHVITD1 39 03/07/2017   25OHVITD2 5.6 03/07/2017   25OHVITD3 33 03/07/2017     Inflammation (CRP: Acute Phase) (ESR: Chronic Phase) Lab Results  Component Value Date   CRP <0.8 11/19/2018   ESRSEDRATE 28 11/19/2018       Note: Above Lab results reviewed.  Recent Imaging Review  CUP PACEART REMOTE DEVICE CHECK Scheduled remote reviewed. Normal device function.  VVI, on Eliquis  Next remote 91 days.    AManley Note: Reviewed        Physical Exam  General appearance: Well nourished, well developed, and well hydrated. In no apparent acute distress Mental status: Alert, oriented x 3 (person, place, & time)       Respiratory: No evidence of acute respiratory distress Eyes: PERLA Vitals: BP (!) 142/78   Pulse 78 Comment: irregular  Temp (!) 96.6 F (35.9 C)   Ht 5' 1"  (1.549 m)   Wt 208 lb (94.3 kg)   SpO2 100%   BMI 39.30 kg/m  BMI: Estimated body mass index is 39.3 kg/m as calculated from the following:   Height as of this encounter: 5' 1"  (1.549 m).    Weight as of this encounter: 208 lb (94.3 kg). Ideal: Ideal body weight: 47.8 kg (105 lb 6.1 oz) Adjusted ideal body weight: 66.4 kg (146 lb 6.8 oz)  Assessment   Status Diagnosis  Controlled Controlled Controlled 1. Chronic pain syndrome   2. Chronic hip pain (Bilateral)   3. Chronic hip pain s/p THR  (Left)   4. Pharmacologic therapy   5. Chronic musculoskeletal pain   6. Muscle spasm of left lower extremity   7. Neurogenic pain      Updated Problems: No problems updated.  Plan of Care  Problem-specific:  No problem-specific Assessment & Plan notes  found for this encounter.  Ms. Jameya Pontiff has a current medication list which includes the following long-term medication(s): allopurinol, cyclobenzaprine, eliquis, metoprolol tartrate, [START ON 01/05/2020] oxycodone, [START ON 02/04/2020] oxycodone, [START ON 03/05/2020] oxycodone, pregabalin, spironolactone, and thyroid.  Pharmacotherapy (Medications Ordered): Meds ordered this encounter  Medications  . oxyCODONE (OXY IR/ROXICODONE) 5 MG immediate release tablet    Sig: Take 1 tablet (5 mg total) by mouth every 6 (six) hours as needed for severe pain. Must last 30 days    Dispense:  120 tablet    Refill:  0    Chronic Pain: STOP Act (Not applicable) Fill 1 day early if closed on refill date. Avoid benzodiazepines within 8 hours of opioids  . oxyCODONE (OXY IR/ROXICODONE) 5 MG immediate release tablet    Sig: Take 1 tablet (5 mg total) by mouth every 6 (six) hours as needed for severe pain. Must last 30 days    Dispense:  120 tablet    Refill:  0    Chronic Pain: STOP Act (Not applicable) Fill 1 day early if closed on refill date. Avoid benzodiazepines within 8 hours of opioids  . oxyCODONE (OXY IR/ROXICODONE) 5 MG immediate release tablet    Sig: Take 1 tablet (5 mg total) by mouth every 6 (six) hours as needed for severe pain. Must last 30 days    Dispense:  120 tablet    Refill:  0    Chronic Pain: STOP Act (Not  applicable) Fill 1 day early if closed on refill date. Avoid benzodiazepines within 8 hours of opioids   Orders:  No orders of the defined types were placed in this encounter.  Follow-up plan:   Return in about 3 months (around 04/04/2020) for (F2F), (Med Mgmt).      Considering:   None at this time.    Palliative PRN treatment(s):   None at this time.     Recent Visits Date Type Provider Dept  10/06/19 Office Visit Milinda Pointer, MD [facility]  Showing recent visits within past 90 days and meeting all other requirements Today's Visits Date Type Provider Dept  12/22/19 Office Visit Milinda Pointer, MD [facility]  Showing today's visits and meeting all other requirements Future Appointments No visits were found meeting these conditions. Showing future appointments within next 90 days and meeting all other requirements  I discussed the assessment and treatment plan with the patient. The patient was provided an opportunity to ask questions and all were answered. The patient agreed with the plan and demonstrated an understanding of the instructions.  Patient advised to call back or seek an in-person evaluation if the symptoms or condition worsens.  Duration of encounter: 30 minutes.  Note by: Gaspar Cola, MD Date: 12/22/2019; Time: 10:53 AM

## 2019-12-25 DIAGNOSIS — M79675 Pain in left toe(s): Secondary | ICD-10-CM | POA: Diagnosis not present

## 2019-12-25 DIAGNOSIS — M79674 Pain in right toe(s): Secondary | ICD-10-CM | POA: Diagnosis not present

## 2019-12-25 DIAGNOSIS — B351 Tinea unguium: Secondary | ICD-10-CM | POA: Diagnosis not present

## 2020-01-30 ENCOUNTER — Ambulatory Visit (INDEPENDENT_AMBULATORY_CARE_PROVIDER_SITE_OTHER): Payer: Medicare Other

## 2020-01-30 DIAGNOSIS — I495 Sick sinus syndrome: Secondary | ICD-10-CM

## 2020-02-01 LAB — CUP PACEART REMOTE DEVICE CHECK
Battery Remaining Longevity: 138 mo
Battery Remaining Percentage: 95.5 %
Battery Voltage: 3.02 V
Brady Statistic RV Percent Paced: 10 %
Date Time Interrogation Session: 20211203031051
Implantable Lead Implant Date: 20090317
Implantable Lead Implant Date: 20090317
Implantable Lead Location: 753859
Implantable Lead Location: 753860
Implantable Pulse Generator Implant Date: 20210305
Lead Channel Impedance Value: 510 Ohm
Lead Channel Pacing Threshold Amplitude: 0.5 V
Lead Channel Pacing Threshold Pulse Width: 0.4 ms
Lead Channel Sensing Intrinsic Amplitude: 12 mV
Lead Channel Setting Pacing Amplitude: 2.5 V
Lead Channel Setting Pacing Pulse Width: 0.4 ms
Lead Channel Setting Sensing Sensitivity: 2 mV
Pulse Gen Model: 2272
Pulse Gen Serial Number: 3802091

## 2020-02-02 ENCOUNTER — Telehealth: Payer: Self-pay

## 2020-02-02 NOTE — Telephone Encounter (Signed)
She wants a nurse to call her as soon as possible. She didn't say what for.

## 2020-02-02 NOTE — Telephone Encounter (Signed)
Wants Oxycodone scripts sent to [facility]. [facility] is no longer in network. Bubble message sent to Dr. Dossie Arbour.

## 2020-02-03 ENCOUNTER — Other Ambulatory Visit: Payer: Self-pay | Admitting: Pain Medicine

## 2020-02-03 DIAGNOSIS — M7918 Myalgia, other site: Secondary | ICD-10-CM

## 2020-02-03 DIAGNOSIS — G894 Chronic pain syndrome: Secondary | ICD-10-CM

## 2020-02-03 DIAGNOSIS — M792 Neuralgia and neuritis, unspecified: Secondary | ICD-10-CM

## 2020-02-03 DIAGNOSIS — G8929 Other chronic pain: Secondary | ICD-10-CM

## 2020-02-03 DIAGNOSIS — M62838 Other muscle spasm: Secondary | ICD-10-CM

## 2020-02-03 MED ORDER — CYCLOBENZAPRINE HCL 5 MG PO TABS: 5.0000 mg | ORAL_TABLET | Freq: Two times a day (BID) | ORAL | 2 refills | Status: AC | PRN

## 2020-02-03 MED ORDER — PREGABALIN 25 MG PO CAPS: 25.0000 mg | ORAL_CAPSULE | Freq: Two times a day (BID) | ORAL | 2 refills | Status: AC

## 2020-02-03 MED ORDER — OXYCODONE HCL 5 MG PO TABS: 5.0000 mg | ORAL_TABLET | Freq: Four times a day (QID) | ORAL | 0 refills | Status: DC | PRN

## 2020-02-03 NOTE — Progress Notes (Signed)
The patient has insurance would not cover the pharmacy that she had and therefore all prescriptions had to be resent to a new pharmacy.

## 2020-02-03 NOTE — Telephone Encounter (Signed)
Dr. Dossie Arbour agreed to send scripts to [facility]. Patient notified per voicemail.

## 2020-02-06 ENCOUNTER — Telehealth: Payer: Self-pay | Admitting: Cardiovascular Disease

## 2020-02-06 MED ORDER — METOPROLOL TARTRATE 50 MG PO TABS
75.0000 mg | ORAL_TABLET | Freq: Two times a day (BID) | ORAL | 0 refills | Status: DC
Start: 2020-02-06 — End: 2020-04-13

## 2020-02-06 NOTE — Telephone Encounter (Signed)
*  STAT* If patient is at the pharmacy, call can be transferred to refill team.   1. Which medications need to be refilled? (please list name of each medication and dose if known) metprolol 50 mg 1.5 bid  2. Which pharmacy/location (including street and city if local pharmacy) is medication to be sent to[facility]  3. Do they need a 30 day or 90 day supply? [place]

## 2020-02-06 NOTE — Telephone Encounter (Signed)
Requested Prescriptions   Signed Prescriptions Disp Refills   metoprolol tartrate (LOPRESSOR) 50 MG tablet 270 tablet 0    Sig: Take 1.5 tablets (75 mg total) by mouth 2 (two) times daily.    Authorizing Provider: Kathlyn [place] A    Ordering User: Raelene Bott, Maribella Kuna L

## 2020-02-10 NOTE — Progress Notes (Signed)
Remote pacemaker transmission.   

## 2020-03-03 ENCOUNTER — Other Ambulatory Visit: Payer: Self-pay | Admitting: Internal Medicine

## 2020-03-03 NOTE — Telephone Encounter (Signed)
Prescription refill request for Eliquis received.  Indication: Afib Last office visit: Arida, 11/20/2019 Scr: 1.0, 11/25/2019 Age: 80 yo  Weight: 94.3 kg   Prescription refill sent.

## 2020-03-31 ENCOUNTER — Other Ambulatory Visit: Payer: Self-pay

## 2020-03-31 ENCOUNTER — Encounter: Payer: Self-pay | Admitting: Pain Medicine

## 2020-03-31 ENCOUNTER — Ambulatory Visit: Payer: Medicare Other | Attending: Pain Medicine | Admitting: Pain Medicine

## 2020-03-31 VITALS — BP 135/74 | HR 80 | Temp 97.2°F | Resp 16 | Ht 60.0 in | Wt 199.0 lb

## 2020-03-31 DIAGNOSIS — T402X5A Adverse effect of other opioids, initial encounter: Secondary | ICD-10-CM | POA: Diagnosis present

## 2020-03-31 DIAGNOSIS — M25552 Pain in left hip: Secondary | ICD-10-CM | POA: Diagnosis present

## 2020-03-31 DIAGNOSIS — G894 Chronic pain syndrome: Secondary | ICD-10-CM | POA: Diagnosis present

## 2020-03-31 DIAGNOSIS — M48062 Spinal stenosis, lumbar region with neurogenic claudication: Secondary | ICD-10-CM | POA: Diagnosis present

## 2020-03-31 DIAGNOSIS — K5903 Drug induced constipation: Secondary | ICD-10-CM | POA: Diagnosis present

## 2020-03-31 DIAGNOSIS — G8929 Other chronic pain: Secondary | ICD-10-CM | POA: Insufficient documentation

## 2020-03-31 DIAGNOSIS — M87052 Idiopathic aseptic necrosis of left femur: Secondary | ICD-10-CM | POA: Insufficient documentation

## 2020-03-31 DIAGNOSIS — Z96642 Presence of left artificial hip joint: Secondary | ICD-10-CM | POA: Diagnosis present

## 2020-03-31 MED ORDER — OXYCODONE HCL 5 MG PO TABS
5.0000 mg | ORAL_TABLET | Freq: Four times a day (QID) | ORAL | 0 refills | Status: DC | PRN
Start: 2020-05-04 — End: 2020-08-01

## 2020-03-31 MED ORDER — OXYCODONE HCL 5 MG PO TABS: 5.0000 mg | ORAL_TABLET | Freq: Four times a day (QID) | ORAL | 0 refills | Status: DC | PRN

## 2020-03-31 MED ORDER — LUBIPROSTONE 8 MCG PO CAPS: 8.0000 ug | ORAL_CAPSULE | Freq: Two times a day (BID) | ORAL | 2 refills | Status: AC

## 2020-03-31 NOTE — Progress Notes (Signed)
Nursing Pain Medication Assessment:  Safety precautions to be maintained throughout the outpatient stay will include: orient to surroundings, keep bed in low position, maintain call bell within reach at all times, provide assistance with transfer out of bed and ambulation.  Medication Inspection Compliance: Pill count conducted under aseptic conditions, in front of the patient. Neither the pills nor the bottle was removed from the patient's sight at any time. Once count was completed pills were immediately returned to the patient in their original bottle.  Medication: Oxycodone IR Pill/Patch Count: 15 of 120 pills remain Pill/Patch Appearance: Markings consistent with prescribed medication Bottle Appearance: Standard pharmacy container. Clearly labeled. Filled Date: 01 / 07 / 2022 Last Medication intake:  Yesterday

## 2020-03-31 NOTE — Progress Notes (Signed)
PROVIDER NOTE: Information contained herein reflects review and annotations entered in association with encounter. Interpretation of such information and data should be left to medically-trained personnel. Information provided to patient can be located elsewhere in the medical record under "Patient Instructions". Document created using STT-dictation technology, any transcriptional errors that may result from process are unintentional.    Patient: [name]  Service Category: E/M  Provider: Gaspar Cola, MD  DOB: [birth year]  DOS: 03/31/2020  Specialty: Interventional Pain Management  MRN: [id number]  Setting: Ambulatory outpatient  PCP: Bryson Corona, NP  Type: Established Patient    Referring Provider: Bryson Corona, NP  Location: Office  Delivery: Face-to-face     HPI  [name], a 80 y.o. year old female, is here today because of her Chronic pain syndrome [G89.4]. [name] primary complain today is Hip Pain (Left hip down the left leg.) Last encounter: My last encounter with her was on 12/22/2019. 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 Pain Assessment: Severity of Chronic pain is reported as a 3 /10. Location: Hip Left/left leg. Onset: More than a month ago. Quality: Aching. Timing: Intermittent. Modifying factor(s): medication, elevation, rest. Vitals:   height is 5' (1.524 m) and weight is 199 lb (90.3 kg). Her temporal temperature is 97.2 F (36.2 C) (abnormal). Her blood pressure is 135/74 and her pulse is 80. Her respiration is 16 and oxygen saturation is 100%.   Reason for encounter: medication management.   The patient indicates doing well with the current medication regimen. No adverse reactions or side effects reported to the medications except for the opioid-induced constipation.  Today I will be starting her on some low-dose Amitiza, 8 mcg p.o. twice daily with meals.  The patient was instructed to cut it down to once a day if she ends up with loose stools.  We talked about her pain medication and how she can slowly begin to taper it down should she feel that he does not need it.  She has been healing well from her hip replacement.  However, she refers that for the past 2 weeks she has needed the medication quite a bit.  I told her that were not in a hurry to have her come off of it, but I did provide her with some guidance as to how she can do that without having any type of withdrawals or severe side effects.  RTCB: 08/02/2020 Nonopioids transfer 12/22/2019: Flexeril and Lyrica  Pharmacotherapy Assessment   Analgesic: Oxycodone IR 5 mg 1 tablet p.o. every 6 hours (20 mg/day of oxycodone)  MME/day:78m/day.   Monitoring: [place] PMP: PDMP reviewed during this encounter.       Pharmacotherapy: No side-effects or adverse reactions reported. Compliance: No problems identified. Effectiveness: Clinically acceptable.  SHart Rochester RN  03/31/2020  1:43 PM  Sign when Signing Visit Nursing Pain Medication Assessment:  Safety precautions to be maintained throughout the outpatient stay will include: orient to surroundings, keep  bed in low position, maintain call bell within reach at all times, provide assistance with transfer out of bed and ambulation.  Medication Inspection Compliance: Pill count conducted under aseptic conditions, in front  of the patient. Neither the pills nor the bottle was removed from the patient's sight at any time. Once count was completed pills were immediately returned to the patient in their original bottle.  Medication: Oxycodone IR Pill/Patch Count: 15 of 120 pills remain Pill/Patch Appearance: Markings consistent with prescribed medication Bottle Appearance: Standard pharmacy container. Clearly labeled. Filled Date: 01 / 07 / 2022 Last Medication intake:  Yesterday    UDS:  Summary  Date Value Ref Range Status  07/17/2019 Note  Final    Comment:    ==================================================================== [employer] (MW) ==================================================================== Test                             Result       Flag       Units Drug Present and Declared for Prescription Verification   Oxycodone                      1677         EXPECTED   ng/mg creat   Oxymorphone                    1013         EXPECTED   ng/mg creat   Noroxycodone                   2315         EXPECTED   ng/mg creat   Noroxymorphone                 254          EXPECTED   ng/mg creat    Sources of oxycodone are scheduled prescription medications.    Oxymorphone, noroxycodone, and noroxymorphone are expected    metabolites of oxycodone. Oxymorphone is also available as a    scheduled prescription medication. Drug Absent but Declared for Prescription Verification   Lorazepam                      Not Detected UNEXPECTED ng/mg creat ==================================================================== Test                      Result    Flag   Units      Ref Range   Creatinine              194              mg/dL      >=20 ==================================================================== Declared Medications:  The flagging and interpretation on this report are based on the  following declared medications.  Unexpected results may arise from  inaccuracies in the declared  medications.  **Note: The testing scope of this panel includes these medications:  Lorazepam (Ativan)  Oxycodone  **Note: The testing scope of this panel does not include the  following reported medications:  Acetaminophen  Allopurinol  Apixaban  Cyclobenzaprine  Liothyronine (Cytomel)  Metoprolol (Lopressor)  Pregabalin (Lyrica)  Spironolactone  Thyroid: Liothyronine/Levothyroxine ([place]) ==================================================================== For clinical consultation, please call [phone]. ====================================================================      ROS  Constitutional: Denies any fever or chills Gastrointestinal: No reported hemesis, hematochezia, vomiting, or acute GI distress Musculoskeletal: Denies any acute onset  joint swelling, redness, loss of ROM, or weakness Neurological: No reported episodes of acute onset apraxia, aphasia, dysarthria, agnosia, amnesia, paralysis, loss of coordination, or loss of consciousness  Medication Review  acetaminophen, allopurinol, apixaban, cyclobenzaprine, liothyronine, lubiprostone, metoprolol tartrate, oxyCODONE, pregabalin, spironolactone, and thyroid  History Review  Allergy: [name] is allergic to contrast media [iodinated diagnostic agents]. Drug: [name]  reports no history of drug use. Alcohol:  reports no history of alcohol use. Tobacco:  reports that she has never smoked. She has never used smokeless tobacco. Social: [name]  reports that she has never smoked. She has never used smokeless tobacco. She reports that she does not drink alcohol and does not use drugs. Medical:  has a past medical history of Anxiety, Aortic insufficiency, Arthritis, Asthma, Bronchitis, Bursitis, hip, Claustrophobia, Claustrophobia, Dysrhythmia, GERD (gastroesophageal reflux disease), Gout, Hyperlipidemia, Hypertension, Hypothyroidism, Mitral regurgitation, Obesity, Panic attacks, Paroxysmal atrial fibrillation  ([place]), PTSD (post-traumatic stress disorder), Rhinitis, Sick sinus syndrome ([place]), and Sleep apnea. Surgical: [name]  has a past surgical history that includes Tonsillectomy; Pacemaker insertion; Status post child birth x1; Rotator cuff repair; Dilatation & curettage/hysteroscopy with myosure (N/A, 08/03/2015); Insert / replace / remove pacemaker; Total hip arthroplasty (Left, 12/22/2015); Cardiac catheterization; Total hip revision (Left, 03/01/2018); and PPM GENERATOR CHANGEOUT (N/A, 05/02/2019). Family: family history includes Alzheimer's disease in her father; Arthritis in her sister; Cancer in her brother and maternal grandfather; Heart disease in her mother; Hypertension in her sister; Stroke in her mother.  Laboratory Chemistry Profile   Renal Lab Results  Component Value Date   BUN 20 04/22/2019   CREATININE 1.11 (H) 04/22/2019   BCR 18 04/22/2019   GFR 53.86 (L) 11/07/2013   GFRAA 55 (L) 04/22/2019   GFRNONAA 47 (L) 04/22/2019     Hepatic Lab Results  Component Value Date   AST 21 11/19/2018   ALT 14 11/19/2018   ALBUMIN 3.7 11/19/2018   ALKPHOS 57 11/19/2018     Electrolytes Lab Results  Component Value Date   NA 140 04/22/2019   K 4.4 04/22/2019   CL 102 04/22/2019   CALCIUM 9.7 04/22/2019   MG 1.5 (L) 11/19/2018     Bone Lab Results  Component Value Date   VD25OH 42.4 11/19/2018   25OHVITD1 39 03/07/2017   25OHVITD2 5.6 03/07/2017   25OHVITD3 33 03/07/2017     Inflammation (CRP: Acute Phase) (ESR: Chronic Phase) Lab Results  Component Value Date   CRP <0.8 11/19/2018   ESRSEDRATE 28 11/19/2018       Note: Above Lab results reviewed.  Recent Imaging Review  CUP PACEART REMOTE DEVICE CHECK Scheduled remote reviewed. Normal device function.  R. Powers, LPN, CVRS  Next remote 91 days. Note: Reviewed        Physical Exam  General appearance: Well nourished, well developed, and well hydrated. In no apparent acute distress Mental status: Alert, oriented  x 3 (person, place, & time)       Respiratory: No evidence of acute respiratory distress Eyes: PERLA Vitals: BP 135/74 (BP Location: Left Arm, Patient Position: Sitting, Cuff Size: Normal)   Pulse 80   Temp (!) 97.2 F (36.2 C) (Temporal)   Resp 16   Ht 5' (1.524 m)   Wt 199 lb (90.3 kg)   SpO2 100%   BMI 38.86 kg/m  BMI: Estimated body mass index is 38.86 kg/m as calculated from the following:   Height as of this encounter: 5' (1.524 m).   Weight as of this  encounter: 199 lb (90.3 kg). Ideal: Ideal body weight: 45.5 kg (100 lb 4.9 oz) Adjusted ideal body weight: 63.4 kg (139 lb 12.6 oz)  Assessment   Status Diagnosis  Controlled Improving Resolved with replacement. 1. Chronic pain syndrome   2. Chronic hip pain (1ry area of Pain) (Left)   3. Avascular necrosis of left femoral head (HCC)   4. Chronic hip pain s/p THR  (Left)   5. Spinal stenosis of lumbar region with neurogenic claudication   6. Opioid-induced constipation ([facility])      Updated Problems: No problems updated.  Plan of Care  Problem-specific:  No problem-specific Assessment & Plan notes found for this encounter.  [name] has a current medication list which includes the following long-term medication(s): allopurinol, cyclobenzaprine, eliquis, lubiprostone, metoprolol tartrate, [START ON 05/04/2020] oxycodone, [START ON 06/03/2020] oxycodone, [START ON 07/03/2020] oxycodone, pregabalin, spironolactone, and thyroid.  Pharmacotherapy (Medications Ordered): Meds ordered this encounter  Medications  . oxyCODONE (OXY IR/ROXICODONE) 5 MG immediate release tablet    Sig: Take 1 tablet (5 mg total) by mouth every 6 (six) hours as needed for severe pain. Must last 30 days    Dispense:  120 tablet    Refill:  0    Chronic Pain: STOP Act (Not applicable) Fill 1 day early if closed on refill date. Avoid benzodiazepines within 8 hours of opioids  . oxyCODONE (OXY IR/ROXICODONE) 5 MG immediate release tablet    Sig:  Take 1 tablet (5 mg total) by mouth every 6 (six) hours as needed for severe pain. Must last 30 days    Dispense:  120 tablet    Refill:  0    Chronic Pain: STOP Act (Not applicable) Fill 1 day early if closed on refill date. Avoid benzodiazepines within 8 hours of opioids  . oxyCODONE (OXY IR/ROXICODONE) 5 MG immediate release tablet    Sig: Take 1 tablet (5 mg total) by mouth every 6 (six) hours as needed for severe pain. Must last 30 days    Dispense:  120 tablet    Refill:  0    Chronic Pain: STOP Act (Not applicable) Fill 1 day early if closed on refill date. Avoid benzodiazepines within 8 hours of opioids  . lubiprostone (AMITIZA) 8 MCG capsule    Sig: Take 1 capsule (8 mcg total) by mouth 2 (two) times daily with a meal. Swallow whole, do not break or chew.    Dispense:  60 capsule    Refill:  2    Fill one day early if pharmacy is closed on scheduled refill date. Generic permitted. Do not send renewal requests.   Orders:  No orders of the defined types were placed in this encounter.  Follow-up plan:   Return in about 4 months (around 08/02/2020) for (F2F), (Med Mgmt) to evaluate Amitiza trial.      Considering:   None at this time.    Palliative PRN treatment(s):   None at this time.     Recent Visits No visits were found meeting these conditions. Showing recent visits within past 90 days and meeting all other requirements Today's Visits Date Type Provider Dept  03/31/20 Office Visit Milinda Pointer, MD [facility]  Showing today's visits and meeting all other requirements Future Appointments No visits were found meeting these conditions. Showing future appointments within next 90 days and meeting all other requirements  I discussed the assessment and treatment plan with the patient. The patient was provided an opportunity to ask  questions and all were answered. The patient agreed with the plan and demonstrated an understanding of the instructions.  Patient  advised to call back or seek an in-person evaluation if the symptoms or condition worsens.  Duration of encounter: 41 minutes.  Note by: Gaspar Cola, MD Date: 03/31/2020; Time: 2:40 PM

## 2020-03-31 NOTE — Patient Instructions (Signed)
____________________________________________________________________________________________  Medication Rules  Purpose: To inform patients, and their family members, of our rules and regulations.  Applies to: All patients receiving prescriptions (written or electronic).  Pharmacy of record: Pharmacy where electronic prescriptions will be sent. If written prescriptions are taken to a different pharmacy, please inform the nursing staff. The pharmacy listed in the electronic medical record should be the one where you would like electronic prescriptions to be sent.  Electronic prescriptions: In compliance with the [place] Strengthen Opioid Misuse Prevention (STOP) Act of 2017 (Session Law 2017-74/H243), effective February 27, 2018, all controlled substances must be electronically prescribed. Calling prescriptions to the pharmacy will cease to exist.  Prescription refills: Only during scheduled appointments. Applies to all prescriptions.  NOTE: The following applies primarily to controlled substances (Opioid* Pain Medications).   Type of encounter (visit): For patients receiving controlled substances, face-to-face visits are required. (Not an option or up to the patient.)  Patient's responsibilities: 1. Pain Pills: Bring all pain pills to every appointment (except for procedure appointments). 2. Pill Bottles: Bring pills in original pharmacy bottle. Always bring the newest bottle. Bring bottle, even if empty. 3. Medication refills: You are responsible for knowing and keeping track of what medications you take and those you need refilled. The day before your appointment: write a list of all prescriptions that need to be refilled. The day of the appointment: give the list to the admitting nurse. Prescriptions will be written only during appointments. No prescriptions will be written on procedure days. If you forget a medication: it will not be "Called in", "Faxed", or "electronically sent".  You will need to get another appointment to get these prescribed. No early refills. Do not call asking to have your prescription filled early. 4. Prescription Accuracy: You are responsible for carefully inspecting your prescriptions before leaving our office. Have the discharge nurse carefully go over each prescription with you, before taking them home. Make sure that your name is accurately spelled, that your address is correct. Check the name and dose of your medication to make sure it is accurate. Check the number of pills, and the written instructions to make sure they are clear and accurate. Make sure that you are given enough medication to last until your next medication refill appointment. 5. Taking Medication: Take medication as prescribed. When it comes to controlled substances, taking less pills or less frequently than prescribed is permitted and encouraged. Never take more pills than instructed. Never take medication more frequently than prescribed.  6. Inform other Doctors: Always inform, all of your healthcare providers, of all the medications you take. 7. Pain Medication from other Providers: You are not allowed to accept any additional pain medication from any other Doctor or Healthcare provider. There are two exceptions to this rule. (see below) In the event that you require additional pain medication, you are responsible for notifying us, as stated below. 8. Cough Medicine: Often these contain an opioid, such as codeine or hydrocodone. Never accept or take cough medicine containing these opioids if you are already taking an opioid* medication. The combination may cause respiratory failure and death. 9. Medication Agreement: You are responsible for carefully reading and following our Medication Agreement. This must be signed before receiving any prescriptions from our practice. Safely store a copy of your signed Agreement. Violations to the Agreement will result in no further prescriptions.  (Additional copies of our Medication Agreement are available upon request.) 10. Laws, Rules, & Regulations: All patients are expected to follow all   Federal and State Laws, Statutes, Rules, & Regulations. Ignorance of the Laws does not constitute a valid excuse.  11. Illegal drugs and Controlled Substances: The use of illegal substances (including, but not limited to marijuana and its derivatives) and/or the illegal use of any controlled substances is strictly prohibited. Violation of this rule may result in the immediate and permanent discontinuation of any and all prescriptions being written by our practice. The use of any illegal substances is prohibited. 12. Adopted CDC guidelines & recommendations: Target dosing levels will be at or below 60 MME/day. Use of benzodiazepines** is not recommended.  Exceptions: There are only two exceptions to the rule of not receiving pain medications from other Healthcare Providers. 1. Exception #1 (Emergencies): In the event of an emergency (i.e.: accident requiring emergency care), you are allowed to receive additional pain medication. However, you are responsible for: As soon as you are able, call our office [phone], at any time of the day or night, and leave a message stating your name, the date and nature of the emergency, and the name and dose of the medication prescribed. In the event that your call is answered by a member of our staff, make sure to document and save the date, time, and the name of the person that took your information.  2. Exception #2 (Planned Surgery): In the event that you are scheduled by another doctor or dentist to have any type of surgery or procedure, you are allowed (for a period no longer than 30 days), to receive additional pain medication, for the acute post-op pain. However, in this case, you are responsible for picking up a copy of our "Post-op Pain Management for Surgeons" handout, and giving it to your surgeon or dentist. This  document is available at our office, and does not require an appointment to obtain it. Simply go to our office during business hours (Monday-Thursday from 8:00 AM to 4:00 PM) (Friday 8:00 AM to 12:00 Noon) or if you have a scheduled appointment with us, prior to your surgery, and ask for it by name. In addition, you are responsible for: calling our office [phone], at any time of the day or night, and leaving a message stating your name, name of your surgeon, type of surgery, and date of procedure or surgery. Failure to comply with your responsibilities may result in termination of therapy involving the controlled substances.  *Opioid medications include: morphine, codeine, oxycodone, oxymorphone, hydrocodone, hydromorphone, meperidine, tramadol, tapentadol, buprenorphine, fentanyl, methadone. **Benzodiazepine medications include: diazepam (Valium), alprazolam (Xanax), clonazepam (Klonopine), lorazepam (Ativan), clorazepate (Tranxene), chlordiazepoxide (Librium), estazolam (Prosom), oxazepam (Serax), temazepam (Restoril), triazolam (Halcion) (Last updated: 01/26/2020) ____________________________________________________________________________________________   ____________________________________________________________________________________________  Medication Recommendations and Reminders  Applies to: All patients receiving prescriptions (written and/or electronic).  Medication Rules & Regulations: These rules and regulations exist for your safety and that of others. They are not flexible and neither are we. Dismissing or ignoring them will be considered "non-compliance" with medication therapy, resulting in complete and irreversible termination of such therapy. (See document titled "Medication Rules" for more details.) In all conscience, because of safety reasons, we cannot continue providing a therapy where the patient does not follow instructions.  Pharmacy of record:   Definition:  This is the pharmacy where your electronic prescriptions will be sent.   We do not endorse any particular pharmacy, however, we have experienced problems with [facility] not securing enough medication supply for the community.  We do not restrict you in your choice of pharmacy. However,   once we write for your prescriptions, we will NOT be re-sending more prescriptions to fix restricted supply problems created by your pharmacy, or your insurance.   The pharmacy listed in the electronic medical record should be the one where you want electronic prescriptions to be sent.  If you choose to change pharmacy, simply notify our nursing staff.  Recommendations:  Keep all of your pain medications in a safe place, under lock and key, even if you live alone. We will NOT replace lost, stolen, or damaged medication.  After you fill your prescription, take 1 week's worth of pills and put them away in a safe place. You should keep a separate, properly labeled bottle for this purpose. The remainder should be kept in the original bottle. Use this as your primary supply, until it runs out. Once it's gone, then you know that you have 1 week's worth of medicine, and it is time to come in for a prescription refill. If you do this correctly, it is unlikely that you will ever run out of medicine.  To make sure that the above recommendation works, it is very important that you make sure your medication refill appointments are scheduled at least 1 week before you run out of medicine. To do this in an effective manner, make sure that you do not leave the office without scheduling your next medication management appointment. Always ask the nursing staff to show you in your prescription , when your medication will be running out. Then arrange for the receptionist to get you a return appointment, at least 7 days before you run out of medicine. Do not wait until you have 1 or 2 pills left, to come in. This is very poor planning and  does not take into consideration that we may need to cancel appointments due to bad weather, sickness, or emergencies affecting our staff.  DO NOT ACCEPT A "Partial Fill": If for any reason your pharmacy does not have enough pills/tablets to completely fill or refill your prescription, do not allow for a "partial fill". The law allows the pharmacy to complete that prescription within 72 hours, without requiring a new prescription. If they do not fill the rest of your prescription within those 72 hours, you will need a separate prescription to fill the remaining amount, which we will NOT provide. If the reason for the partial fill is your insurance, you will need to talk to the pharmacist about payment alternatives for the remaining tablets, but again, DO NOT ACCEPT A PARTIAL FILL, unless you can trust your pharmacist to obtain the remainder of the pills within 72 hours.  Prescription refills and/or changes in medication(s):   Prescription refills, and/or changes in dose or medication, will be conducted only during scheduled medication management appointments. (Applies to both, written and electronic prescriptions.)  No refills on procedure days. No medication will be changed or started on procedure days. No changes, adjustments, and/or refills will be conducted on a procedure day. Doing so will interfere with the diagnostic portion of the procedure.  No phone refills. No medications will be "called into the pharmacy".  No Fax refills.  No weekend refills.  No Holliday refills.  No after hours refills.  Remember:  Business hours are:  Monday to Thursday 8:00 AM to 4:00 PM Provider's Schedule: Calissa Swenor, MD - Appointments are:  Medication management: Monday and Wednesday 8:00 AM to 4:00 PM Procedure day: Tuesday and Thursday 7:30 AM to 4:00 PM Bilal Lateef, MD - Appointments are:    Medication management: Tuesday and Thursday 8:00 AM to 4:00 PM Procedure day: Monday and Wednesday  7:30 AM to 4:00 PM (Last update: 09/17/2019) ____________________________________________________________________________________________   ____________________________________________________________________________________________  CBD (cannabidiol) WARNING  Applicable to: All individuals currently taking or considering taking CBD (cannabidiol) and, more important, all patients taking opioid analgesic controlled substances (pain medication). (Example: oxycodone; oxymorphone; hydrocodone; hydromorphone; morphine; methadone; tramadol; tapentadol; fentanyl; buprenorphine; butorphanol; dextromethorphan; meperidine; codeine; etc.)  Legal status: CBD remains a Schedule I drug prohibited for any use. CBD is illegal with one exception. In the [place], CBD has a limited Food and Drug Administration (FDA) approval for the treatment of two specific types of epilepsy disorders. Only one CBD product has been approved by the FDA for this purpose: "Epidiolex". FDA is aware that some companies are marketing products containing cannabis and cannabis-derived compounds in ways that violate the Federal Food, Drug and Cosmetic Act (FD&C Act) and that may put the health and safety of consumers at risk. The FDA, a Federal agency, has not enforced the CBD status since 2018.   Legality: Some manufacturers ship CBD products nationally, which is illegal. Often such products are sold online and are therefore available throughout the country. CBD is openly sold in head shops and health food stores in some states where such sales have not been explicitly legalized. Selling unapproved products with unsubstantiated therapeutic claims is not only a violation of the law, but also can put patients at risk, as these products have not been proven to be safe or effective. Federal illegality makes it difficult to conduct research on CBD.  Reference: "FDA Regulation of Cannabis and Cannabis-Derived Products, Including Cannabidiol  (CBD)" - https://www.fda.gov/news-events/public-health-focus/fda-regulation-cannabis-and-cannabis-derived-products-including-cannabidiol-cbd  Warning: CBD is not FDA approved and has not undergo the same manufacturing controls as prescription drugs.  This means that the purity and safety of available CBD may be questionable. Most of the time, despite manufacturer's claims, it is contaminated with THC (delta-9-tetrahydrocannabinol - the chemical in marijuana responsible for the "HIGH").  When this is the case, the THC contaminant will trigger a positive urine drug screen (UDS) test for Marijuana (carboxy-THC). Because a positive UDS for any illicit substance is a violation of our medication agreement, your opioid analgesics (pain medicine) may be permanently discontinued.  MORE ABOUT CBD  General Information: CBD  is a derivative of the Marijuana (cannabis sativa) plant discovered in 1940. It is one of the 113 identified substances found in Marijuana. It accounts for up to 40% of the plant's extract. As of 2018, preliminary clinical studies on CBD included research for the treatment of anxiety, movement disorders, and pain. CBD is available and consumed in multiple forms, including inhalation of smoke or vapor, as an aerosol spray, and by mouth. It may be supplied as an oil containing CBD, capsules, dried cannabis, or as a liquid solution. CBD is thought not to be as psychoactive as THC (delta-9-tetrahydrocannabinol - the chemical in marijuana responsible for the "HIGH"). Studies suggest that CBD may interact with different biological target receptors in the body, including cannabinoid and other neurotransmitter receptors. As of 2018 the mechanism of action for its biological effects has not been determined.  Side-effects  Adverse reactions: Dry mouth, diarrhea, decreased appetite, fatigue, drowsiness, malaise, weakness, sleep disturbances, and others.  Drug interactions: CBC may interact with other  medications such as blood-thinners. (Last update: 10/04/2019) ____________________________________________________________________________________________    

## 2020-04-13 ENCOUNTER — Other Ambulatory Visit: Payer: Self-pay | Admitting: Cardiovascular Disease

## 2020-04-30 ENCOUNTER — Ambulatory Visit (INDEPENDENT_AMBULATORY_CARE_PROVIDER_SITE_OTHER): Payer: Medicare Other

## 2020-04-30 DIAGNOSIS — I495 Sick sinus syndrome: Secondary | ICD-10-CM | POA: Diagnosis not present

## 2020-05-03 LAB — CUP PACEART REMOTE DEVICE CHECK
Battery Remaining Longevity: 136 mo
Battery Remaining Percentage: 95.5 %
Battery Voltage: 3.02 V
Brady Statistic RV Percent Paced: 10 %
Date Time Interrogation Session: 20220304020013
Implantable Lead Implant Date: 20090317
Implantable Lead Implant Date: 20090317
Implantable Lead Location: 753859
Implantable Lead Location: 753860
Implantable Pulse Generator Implant Date: 20210305
Lead Channel Impedance Value: 460 Ohm
Lead Channel Pacing Threshold Amplitude: 0.5 V
Lead Channel Pacing Threshold Pulse Width: 0.4 ms
Lead Channel Sensing Intrinsic Amplitude: 11 mV
Lead Channel Setting Pacing Amplitude: 2.5 V
Lead Channel Setting Pacing Pulse Width: 0.4 ms
Lead Channel Setting Sensing Sensitivity: 2 mV
Pulse Gen Model: 2272
Pulse Gen Serial Number: 3802091

## 2020-05-11 NOTE — Progress Notes (Signed)
Remote pacemaker transmission.   

## 2020-05-27 ENCOUNTER — Ambulatory Visit: Payer: Medicare Other | Admitting: Cardiovascular Disease

## 2020-05-28 ENCOUNTER — Ambulatory Visit (INDEPENDENT_AMBULATORY_CARE_PROVIDER_SITE_OTHER): Payer: Medicare Other | Admitting: Family

## 2020-05-28 ENCOUNTER — Encounter: Payer: Self-pay | Admitting: Family

## 2020-05-28 ENCOUNTER — Other Ambulatory Visit: Payer: Self-pay

## 2020-05-28 VITALS — BP 118/80 | HR 80 | Ht 61.0 in | Wt 201.0 lb

## 2020-05-28 DIAGNOSIS — Z7901 Long term (current) use of anticoagulants: Secondary | ICD-10-CM

## 2020-05-28 DIAGNOSIS — I4821 Permanent atrial fibrillation: Secondary | ICD-10-CM | POA: Diagnosis not present

## 2020-05-28 DIAGNOSIS — Z950 Presence of cardiac pacemaker: Secondary | ICD-10-CM | POA: Diagnosis not present

## 2020-05-28 NOTE — Progress Notes (Signed)
Office Visit    Patient Name: [name] Date of Encounter: 05/28/2020  PCP:  Bryson Corona, NP   [place]  Cardiologist:  Kathlyn Sacramento, MD  Advanced Practice Provider:  No care team member to display Electrophysiologist:  Virl Axe, MD   Chief Complaint    [name] is a 80 y.o. female with a hx of chronic atrial fibrillation on anticoagulation, HTN, SSS s/p permanent pacemaker presents today for follow up of atrial fibrillation.    Past Medical History    Past Medical History:  Diagnosis Date  . Anxiety   . Aortic insufficiency    Trivial  . Arthritis   . Asthma   . Bronchitis   . Bursitis, hip   . Claustrophobia   . Claustrophobia   . Dysrhythmia    a fib  . GERD (gastroesophageal reflux disease)    history of  . Gout   . Hyperlipidemia   . Hypertension   . Hypothyroidism   . Mitral regurgitation   . Obesity   . Panic attacks   . Paroxysmal atrial fibrillation (HCC)   . PTSD (post-traumatic stress disorder)   . Rhinitis   . Sick sinus syndrome ([place])   . Sleep apnea    was ordered CPAP, but never fitted for one years ago, Dr has retired   Past Surgical History:  Procedure Laterality Date  . CARDIAC CATHETERIZATION    . DILATATION & CURETTAGE/HYSTEROSCOPY WITH MYOSURE N/A 08/03/2015   Procedure: DILATATION & CURETTAGE/HYSTEROSCOPY WITH MYOSURE;  Surgeon: Alden Hipp, MD;  Location: [facility];  Service: Gynecology;  Laterality: N/A;  Patient has an ICD   . INSERT / REPLACE / REMOVE PACEMAKER    . PACEMAKER INSERTION     Status post insertion  . PPM GENERATOR CHANGEOUT N/A 05/02/2019   Procedure: PPM GENERATOR CHANGEOUT;  Surgeon: Deboraha Sprang, MD;  Location: [facility];  Service: Cardiovascular;  Laterality: N/A;  . ROTATOR CUFF REPAIR     left  . Status post child birth x1    . TONSILLECTOMY    . TOTAL HIP ARTHROPLASTY Left 12/22/2015   Procedure: TOTAL HIP ARTHROPLASTY;  Surgeon: Dereck Leep, MD;   Location: [facility];  Service: Orthopedics;  Laterality: Left;  . TOTAL HIP REVISION Left 03/01/2018   Procedure: TOTAL HIP REVISION;  Surgeon: Dereck Leep, MD;  Location: [facility];  Service: Orthopedics;  Laterality: Left;    Allergies  Allergies  Allergen Reactions  . Contrast Media [Iodinated Diagnostic Agents] Rash    Contrast dye causes swelling    History of Present Illness    [name] is a 80 y.o. female with a hx of  chronic atrial fibrillation on anticoagulation, HTN, SSS s/p permanent pacemaker last seen 11/20/19 by Dr. Fletcher Anon.  She had pacemaker implanted in 2010 by Dr. Lovena Le for bradycardia in the context of permanent atrial fibrillation.  She underwent generator replacement March 2021.  She was previously anticoagulated with warfarin that was transitioned to Eliquis in 2020.  She presents today for follow-up with her spouse. Reports no shortness of breath nor dyspnea on exertion. Reports no chest pain, pressure, or tightness. No edema, orthopnea, PND. Reports no palpitations.  She shares with me that she has been feeling overall well.  She does try to stay active though has no formal exercise routine.  Endorses eating a low-sodium, heart healthy diet.  Reports no bleeding complications on Eliquis.  She much prefers Eliquis over  her previous warfarin.   EKGs/Labs/Other Studies Reviewed:   The following studies were reviewed today:   EKG:  EKG is  ordered today.  The ekg ordered today demonstrates rate controlled atrial fibrillation 70 BPM with no acute ST/T changes.  Recent Labs: No results found for requested labs within last 8760 hours.  Recent Lipid Panel    Component Value Date/Time   CHOL 208 (H) 11/07/2013 1424   TRIG 142.0 11/07/2013 1424   HDL 50.00 11/07/2013 1424   CHOLHDL 4 11/07/2013 1424   VLDL 28.4 11/07/2013 1424   LDLCALC 130 (H) 11/07/2013 1424    Home Medications   Current Meds  Medication Sig  . acetaminophen (TYLENOL) 500 MG tablet  Take 1,000 mg by mouth every 6 (six) hours as needed for headache.  . allopurinol (ZYLOPRIM) 100 MG tablet Take 100 mg by mouth 2 (two) times daily.   . cyclobenzaprine (FLEXERIL) 5 MG tablet Take 1 tablet (5 mg total) by mouth 2 (two) times daily as needed for muscle spasms.  Marland Kitchen ELIQUIS 5 MG TABS tablet TAKE 1 TABLET TWICE A DAY  . liothyronine (CYTOMEL) 5 MCG tablet Take 15 mcg by mouth daily.   Marland Kitchen lubiprostone (AMITIZA) 8 MCG capsule Take 1 capsule (8 mcg total) by mouth 2 (two) times daily with a meal. Swallow whole, do not break or chew.  . metoprolol tartrate (LOPRESSOR) 50 MG tablet TAKE ONE AND ONE-HALF TABLETS TWICE A DAY  . oxyCODONE (OXY IR/ROXICODONE) 5 MG immediate release tablet Take 1 tablet (5 mg total) by mouth every 6 (six) hours as needed for severe pain. Must last 30 days  . [START ON 06/03/2020] oxyCODONE (OXY IR/ROXICODONE) 5 MG immediate release tablet Take 1 tablet (5 mg total) by mouth every 6 (six) hours as needed for severe pain. Must last 30 days  . [START ON 07/03/2020] oxyCODONE (OXY IR/ROXICODONE) 5 MG immediate release tablet Take 1 tablet (5 mg total) by mouth every 6 (six) hours as needed for severe pain. Must last 30 days  . pregabalin (LYRICA) 25 MG capsule Take 1 capsule (25 mg total) by mouth 2 (two) times daily.  Marland Kitchen spironolactone (ALDACTONE) 100 MG tablet Take 100 mg by mouth daily.  Marland Kitchen thyroid (ARMOUR) 60 MG tablet Take 60 mg by mouth daily before breakfast.      Review of Systems  All other systems reviewed and are otherwise negative except as noted above.  Physical Exam    VS:  BP 118/80 (BP Location: Left Arm, Patient Position: Sitting, Cuff Size: Normal)   Pulse 80   Ht 5\' 1"  (1.549 m)   Wt 201 lb (91.2 kg)   SpO2 99%   BMI 37.98 kg/m  , BMI Body mass index is 37.98 kg/m.  Wt Readings from Last 3 Encounters:  05/28/20 201 lb (91.2 kg)  03/31/20 199 lb (90.3 kg)  12/22/19 208 lb (94.3 kg)     GEN: Well nourished, well developed, in no acute  distress. HEENT: normal. Neck: Supple, no JVD, carotid bruits, or masses. Cardiac: Irregularly irregular, no murmurs, rubs, or gallops. No clubbing, cyanosis, edema.  Radials/PT 2+ and equal bilaterally.  Respiratory:  Respirations regular and unlabored, clear to auscultation bilaterally. GI: Soft, nontender, nondistended. MS: No deformity or atrophy. Skin: Warm and dry, no rash. Neuro:  Strength and sensation are intact. Psych: Normal affect.  Assessment & Plan    1. Chronic atrial fib on Eliquis- CHADS2VASc of at least 4 (agex2, gender, HTN). Denies bleeding complications on  Eliquis 5 mg BID. Does not meet dose reduction criteria. Lab work for monitoring in a couple weeks with PCP. Rate well controlled by EKG today. Continue Lopressor 75mg  BID.   2. S/p PPM - Continue to follow with Dr. Caryl Comes.  3. HTN - BP well controlled. Continue current antihypertensive regimen.   Disposition: Follow up in July for in person device check with Dr. Caryl Comes and in November 2022 with Dr. Fletcher Anon.    Signed, Loel Dubonnet, NP 05/28/2020, 12:57 PM [place]

## 2020-05-28 NOTE — Patient Instructions (Addendum)
Medication Instructions:  Continue your current medications.   *If you need a refill on your cardiac medications before your next appointment, please call your pharmacy*   Lab Work: None ordered today.  Please request your primary care provider forward your lab work.   Testing/Procedures: Your EKG today shows rate controlled atrial fibrillation.   Follow-Up: At [facility], you and your health needs are our priority.  As part of our continuing mission to provide you with exceptional heart care, we have created designated Provider Care Teams.  These Care Teams include your primary Cardiologist (physician) and Advanced Practice Providers (APPs -  Physician Assistants and Nurse Practitioners) who all work together to provide you with the care you need, when you need it.  We recommend signing up for the patient portal called "MyChart".  Sign up information is provided on this After Visit Summary.  MyChart is used to connect with patients for Virtual Visits (Telemedicine).  Patients are able to view lab/test results, encounter notes, upcoming appointments, etc.  Non-urgent messages can be sent to your provider as well.   To learn more about what you can do with MyChart, go to [email or web address].    Your next appointment:   1) With Dr. Caryl Comes- next available (Device follow up)  2) With Dr. Fletcher Anon or APP in November 2022   Other Instructions  Heart Healthy Diet Recommendations: A low-salt diet is recommended. Meats should be grilled, baked, or boiled. Avoid fried foods. Focus on lean protein sources like fish or chicken with vegetables and fruits. The American Heart Association is a Microbiologist!  American Heart Association Diet and Lifeystyle Recommendations   Exercise recommendations: The American Heart Association recommends 150 minutes of moderate intensity exercise weekly. Try 30 minutes of moderate intensity exercise 4-5 times per week. This could include walking, jogging, or  swimming.

## 2020-06-15 ENCOUNTER — Other Ambulatory Visit: Payer: Self-pay | Admitting: Family Medicine

## 2020-06-15 DIAGNOSIS — E042 Nontoxic multinodular goiter: Secondary | ICD-10-CM

## 2020-06-28 ENCOUNTER — Encounter: Payer: Self-pay | Admitting: Internal Medicine

## 2020-06-29 ENCOUNTER — Other Ambulatory Visit: Payer: Self-pay

## 2020-06-29 ENCOUNTER — Ambulatory Visit
Admission: RE | Admit: 2020-06-29 | Discharge: 2020-06-29 | Disposition: A | Discharge status: Home / Self Care | Payer: Medicare Other | Source: Ambulatory Visit | Attending: Family Medicine | Admitting: Family Medicine

## 2020-06-29 DIAGNOSIS — E042 Nontoxic multinodular goiter: Secondary | ICD-10-CM | POA: Insufficient documentation

## 2020-07-07 ENCOUNTER — Other Ambulatory Visit: Payer: Self-pay | Admitting: Cardiovascular Disease

## 2020-07-30 ENCOUNTER — Ambulatory Visit (INDEPENDENT_AMBULATORY_CARE_PROVIDER_SITE_OTHER): Payer: Medicare Other

## 2020-07-30 DIAGNOSIS — I495 Sick sinus syndrome: Secondary | ICD-10-CM | POA: Diagnosis not present

## 2020-07-30 LAB — CUP PACEART REMOTE DEVICE CHECK
Battery Remaining Longevity: 133 mo
Battery Remaining Percentage: 95.5 %
Battery Voltage: 3.02 V
Brady Statistic RV Percent Paced: 10 %
Date Time Interrogation Session: 20220603020015
Implantable Lead Implant Date: 20090317
Implantable Lead Implant Date: 20090317
Implantable Lead Location: 753859
Implantable Lead Location: 753860
Implantable Pulse Generator Implant Date: 20210305
Lead Channel Impedance Value: 460 Ohm
Lead Channel Pacing Threshold Amplitude: 0.5 V
Lead Channel Pacing Threshold Pulse Width: 0.4 ms
Lead Channel Sensing Intrinsic Amplitude: 9.9 mV
Lead Channel Setting Pacing Amplitude: 2.5 V
Lead Channel Setting Pacing Pulse Width: 0.4 ms
Lead Channel Setting Sensing Sensitivity: 2 mV
Pulse Gen Model: 2272
Pulse Gen Serial Number: 3802091

## 2020-08-01 DIAGNOSIS — Z79891 Long term (current) use of opiate analgesic: Secondary | ICD-10-CM | POA: Insufficient documentation

## 2020-08-01 NOTE — Progress Notes (Signed)
PROVIDER NOTE: Information contained herein reflects review and annotations entered in association with encounter. Interpretation of such information and data should be left to medically-trained personnel. Information provided to patient can be located elsewhere in the medical record under "Patient Instructions". Document created using STT-dictation technology, any transcriptional errors that may result from process are unintentional.    Patient: [name]  Service Category: E/M  Provider: Gaspar Cola, MD  DOB: 01/27/1941  DOS: 08/02/2020  Specialty: Interventional Pain Management  MRN: [id number]  Setting: Ambulatory outpatient  PCP: Bryson Corona, NP  Type: Established Patient    Referring Provider: Bryson Corona, NP  Location: Office  Delivery: Face-to-face     HPI  Ms. Deer Creek, a 80 y.o. year old female, is here today because of her Chronic pain syndrome [G89.4]. Ms. Durango primary complain today is Hip Pain (bilateral) Last encounter: My last encounter with her was on 03/31/2020. Pertinent problems: Ms. 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 Pain Assessment: Severity of   is reported as a 3 /10. Location: Hip Right,Left/denies. Onset: More than a month ago. Quality: Aching,Constant. Timing: Constant. Modifying factor(s): medications. Vitals:  height is 5' 1"  (1.549 m) and weight  is 196 lb 4.8 oz (89 kg). Her temperature is 96.6 F (35.9 C) (abnormal). Her blood pressure is 130/65 and her pulse is 74. Her respiration is 18 and oxygen saturation is 100%.   Reason for encounter: medication management.  The patient indicates doing well with the current medication regimen. No adverse reactions or side effects reported to the medications.   RTCB: 10/31/2020 Nonopioids transfer 12/22/2019: Flexeril and Lyrica  Pharmacotherapy Assessment   Analgesic: Oxycodone IR 5 mg 1 tablet p.o. every 6 hours (20 mg/day of oxycodone)  MME/day:63m/day.   Monitoring: [place] PMP: PDMP reviewed during this encounter.       Pharmacotherapy: No side-effects or adverse reactions reported. Compliance: No problems identified. Effectiveness: Clinically acceptable.  TDewayne Shorter RN  08/02/2020  1:29 PM  Signed Nursing Pain Medication Assessment:  Safety precautions to be maintained throughout the outpatient stay will include: orient to surroundings, keep bed in low position, maintain call bell within reach at all times, provide assistance with transfer out of bed and ambulation.  Medication Inspection Compliance: Pill count conducted under aseptic conditions, in front of the patient. Neither the pills nor the bottle was removed from the patient's sight at any time. Once count was completed pills were immediately returned to the patient in their original bottle.  Medication: Oxycodone IR Pill/Patch Count: 6 of 120 pills remain Pill/Patch Appearance: Markings consistent with prescribed medication Bottle Appearance: Standard pharmacy container. Clearly labeled. Filled Date:05 / 07 / 2022 Last Medication intake:  Today    UDS:  Summary  Date Value Ref Range Status  07/17/2019 Note  Final    Comment:    ==================================================================== ToxASSU[employer] Select 13 (MW) ==================================================================== Test  Result       Flag       Units Drug Present and Declared for Prescription Verification   Oxycodone                      1677         EXPECTED   ng/mg creat   Oxymorphone                    1013         EXPECTED   ng/mg creat   Noroxycodone                   2315         EXPECTED   ng/mg creat   Noroxymorphone                 254          EXPECTED   ng/mg creat    Sources of oxycodone are scheduled prescription medications.    Oxymorphone, noroxycodone, and noroxymorphone are expected    metabolites of oxycodone. Oxymorphone is also available as a    scheduled prescription medication. Drug Absent but Declared for Prescription Verification   Lorazepam                      Not Detected UNEXPECTED ng/mg creat ==================================================================== Test                      Result    Flag   Units      Ref Range   Creatinine              194              mg/dL      >=20 ==================================================================== Declared Medications:  The flagging and interpretation on this report are based on the  following declared medications.  Unexpected results may arise from  inaccuracies in the declared medications.  **Note: The testing scope of this panel includes these medications:  Lorazepam (Ativan)  Oxycodone  **Note: The testing scope of this panel does not include the  following reported medications:  Acetaminophen  Allopurinol  Apixaban  Cyclobenzaprine  Liothyronine (Cytomel)  Metoprolol (Lopressor)  Pregabalin (Lyrica)  Spironolactone  Thyroid: Liothyronine/Levothyroxine ([place]) ==================================================================== For clinical consultation, please call [phone]. ====================================================================      ROS  Constitutional: Denies any fever or chills Gastrointestinal: No reported hemesis, hematochezia, vomiting, or acute GI distress Musculoskeletal:  Denies any acute onset joint swelling, redness, loss of ROM, or weakness Neurological: No reported episodes of acute onset apraxia, aphasia, dysarthria, agnosia, amnesia, paralysis, loss of coordination, or loss of consciousness  Medication Review  acetaminophen, allopurinol, apixaban, cyclobenzaprine, hydroxychloroquine, liothyronine, lubiprostone, metoprolol tartrate, oxyCODONE, pregabalin, spironolactone, and thyroid  History Review  Allergy: Ms. Bonus is allergic to contrast media [iodinated diagnostic agents]. Drug: Ms. Bambrick  reports no history of drug use. Alcohol:  reports no history of alcohol use. Tobacco:  reports that she has never smoked. She has never used smokeless tobacco. Social: Ms. Flanary  reports that she has never smoked. She has never used smokeless tobacco. She reports that she does not drink alcohol and does not use drugs. Medical:  has a past medical history of Anxiety, Aortic insufficiency, Arthritis, Asthma, Bronchitis, Bursitis, hip, Claustrophobia, Claustrophobia, Dysrhythmia, GERD (gastroesophageal reflux disease), Gout, Hyperlipidemia, Hypertension, Hypothyroidism, Mitral regurgitation, Obesity, Panic attacks, Paroxysmal atrial fibrillation ([place]), PTSD (post-traumatic stress disorder), Rhinitis,  Sick sinus syndrome ([place]), and Sleep apnea. Surgical: Ms. Fults  has a past surgical history that includes Tonsillectomy; Pacemaker insertion; Status post child birth x1; Rotator cuff repair; Dilatation & curettage/hysteroscopy with myosure (N/A, 08/03/2015); Insert / replace / remove pacemaker; Total hip arthroplasty (Left, 12/22/2015); Cardiac catheterization; Total hip revision (Left, 03/01/2018); and PPM GENERATOR CHANGEOUT (N/A, 05/02/2019). Family: family history includes Alzheimer's disease in her father; Arthritis in her sister; Cancer in her brother and maternal grandfather; Heart disease in her mother; Hypertension in her sister; Stroke in her mother.  Laboratory Chemistry  Profile   Renal Lab Results  Component Value Date   BUN 20 04/22/2019   CREATININE 1.11 (H) 04/22/2019   BCR 18 04/22/2019   GFR 53.86 (L) 11/07/2013   GFRAA 55 (L) 04/22/2019   GFRNONAA 47 (L) 04/22/2019     Hepatic Lab Results  Component Value Date   AST 21 11/19/2018   ALT 14 11/19/2018   ALBUMIN 3.7 11/19/2018   ALKPHOS 57 11/19/2018     Electrolytes Lab Results  Component Value Date   NA 140 04/22/2019   K 4.4 04/22/2019   CL 102 04/22/2019   CALCIUM 9.7 04/22/2019   MG 1.5 (L) 11/19/2018     Bone Lab Results  Component Value Date   VD25OH 42.4 11/19/2018   25OHVITD1 39 03/07/2017   25OHVITD2 5.6 03/07/2017   25OHVITD3 33 03/07/2017     Inflammation (CRP: Acute Phase) (ESR: Chronic Phase) Lab Results  Component Value Date   CRP <0.8 11/19/2018   ESRSEDRATE 28 11/19/2018       Note: Above Lab results reviewed.  Recent Imaging Review  CUP PACEART REMOTE DEVICE CHECK Scheduled remote reviewed. Normal device function.    Next remote 91 days. LH Note: Reviewed        Physical Exam  General appearance: Well nourished, well developed, and well hydrated. In no apparent acute distress Mental status: Alert, oriented x 3 (person, place, & time)       Respiratory: No evidence of acute respiratory distress Eyes: PERLA Vitals: BP 130/65   Pulse 74   Temp (!) 96.6 F (35.9 C)   Resp 18   Ht 5' 1"  (1.549 m)   Wt 196 lb 4.8 oz (89 kg)   SpO2 100%   BMI 37.09 kg/m  BMI: Estimated body mass index is 37.09 kg/m as calculated from the following:   Height as of this encounter: 5' 1"  (1.549 m).   Weight as of this encounter: 196 lb 4.8 oz (89 kg). Ideal: Ideal body weight: 47.8 kg (105 lb 6.1 oz) Adjusted ideal body weight: 64.3 kg (141 lb 12 oz)  Assessment   Status Diagnosis  Controlled Controlled Controlled 1. Chronic pain syndrome   2. Chronic hip pain (1ry area of Pain) (Left)   3. Chronic hip pain s/p THR  (Left)   4. Pharmacologic therapy    5. Chronic use of opiate for therapeutic purpose      Updated Problems: No problems updated.  Plan of Care  Problem-specific:  No problem-specific Assessment & Plan notes found for this encounter.  Ms. Asja Frommer has a current medication list which includes the following long-term medication(s): allopurinol, cyclobenzaprine, eliquis, lubiprostone, metoprolol tartrate, oxycodone, [START ON 09/01/2020] oxycodone, [START ON 10/01/2020] oxycodone, pregabalin, spironolactone, and thyroid.  Pharmacotherapy (Medications Ordered): Meds ordered this encounter  Medications  . oxyCODONE (OXY IR/ROXICODONE) 5 MG immediate release tablet    Sig: Take 1 tablet (5 mg total) by mouth every  6 (six) hours as needed for severe pain. Must last 30 days    Dispense:  120 tablet    Refill:  0    Not a duplicate. Do NOT delete! Dispense 1 day early if closed on refill date. Avoid benzodiazepines within 8 hours of opioids. Do not send refill requests.  Marland Kitchen oxyCODONE (OXY IR/ROXICODONE) 5 MG immediate release tablet    Sig: Take 1 tablet (5 mg total) by mouth every 6 (six) hours as needed for severe pain. Must last 30 days    Dispense:  120 tablet    Refill:  0    Not a duplicate. Do NOT delete! Dispense 1 day early if closed on refill date. Avoid benzodiazepines within 8 hours of opioids. Do not send refill requests.  Marland Kitchen oxyCODONE (OXY IR/ROXICODONE) 5 MG immediate release tablet    Sig: Take 1 tablet (5 mg total) by mouth every 6 (six) hours as needed for severe pain. Must last 30 days    Dispense:  120 tablet    Refill:  0    Not a duplicate. Do NOT delete! Dispense 1 day early if closed on refill date. Avoid benzodiazepines within 8 hours of opioids. Do not send refill requests.   Orders:  Orders Placed This Encounter  Procedures  . ToxASSURE Select 13 (MW), Urine    Volume: 30 ml(s). Minimum 3 ml of urine is needed. Document temperature of fresh sample. Indications: Long term (current) use of opiate  analgesic (B86.754)    Order Specific Question:   Release to patient    Answer:   Immediate   Follow-up plan:   Return in about 3 months (around 10/31/2020) for evaluation day (F2F) (MM).      Considering:   None at this time.    Palliative PRN treatment(s):   None at this time.      Recent Visits No visits were found meeting these conditions. Showing recent visits within past 90 days and meeting all other requirements Today's Visits Date Type Provider Dept  08/02/20 Office Visit Milinda Pointer, MD [facility]  Showing today's visits and meeting all other requirements Future Appointments No visits were found meeting these conditions. Showing future appointments within next 90 days and meeting all other requirements  I discussed the assessment and treatment plan with the patient. The patient was provided an opportunity to ask questions and all were answered. The patient agreed with the plan and demonstrated an understanding of the instructions.  Patient advised to call back or seek an in-person evaluation if the symptoms or condition worsens.  Duration of encounter: 30 minutes.  Note by: Gaspar Cola, MD Date: 08/02/2020; Time: 1:33 PM

## 2020-08-02 ENCOUNTER — Ambulatory Visit: Payer: Medicare Other | Attending: Pain Medicine | Admitting: Pain Medicine

## 2020-08-02 ENCOUNTER — Encounter: Payer: Self-pay | Admitting: Pain Medicine

## 2020-08-02 ENCOUNTER — Other Ambulatory Visit: Payer: Self-pay

## 2020-08-02 VITALS — BP 130/65 | HR 74 | Temp 96.6°F | Resp 18 | Ht 61.0 in | Wt 196.3 lb

## 2020-08-02 DIAGNOSIS — Z79891 Long term (current) use of opiate analgesic: Secondary | ICD-10-CM

## 2020-08-02 DIAGNOSIS — M25552 Pain in left hip: Secondary | ICD-10-CM | POA: Insufficient documentation

## 2020-08-02 DIAGNOSIS — Z96642 Presence of left artificial hip joint: Secondary | ICD-10-CM | POA: Diagnosis present

## 2020-08-02 DIAGNOSIS — Z79899 Other long term (current) drug therapy: Secondary | ICD-10-CM | POA: Diagnosis not present

## 2020-08-02 DIAGNOSIS — G8929 Other chronic pain: Secondary | ICD-10-CM | POA: Diagnosis present

## 2020-08-02 DIAGNOSIS — G894 Chronic pain syndrome: Secondary | ICD-10-CM | POA: Diagnosis not present

## 2020-08-02 MED ORDER — OXYCODONE HCL 5 MG PO TABS: 5.0000 mg | ORAL_TABLET | Freq: Four times a day (QID) | ORAL | 0 refills | Status: DC | PRN

## 2020-08-02 NOTE — Progress Notes (Signed)
Nursing Pain Medication Assessment:  Safety precautions to be maintained throughout the outpatient stay will include: orient to surroundings, keep bed in low position, maintain call bell within reach at all times, provide assistance with transfer out of bed and ambulation.  Medication Inspection Compliance: Pill count conducted under aseptic conditions, in front of the patient. Neither the pills nor the bottle was removed from the patient's sight at any time. Once count was completed pills were immediately returned to the patient in their original bottle.  Medication: Oxycodone IR Pill/Patch Count: 6 of 120 pills remain Pill/Patch Appearance: Markings consistent with prescribed medication Bottle Appearance: Standard pharmacy container. Clearly labeled. Filled Date:05 / 07 / 2022 Last Medication intake:  Today

## 2020-08-02 NOTE — Patient Instructions (Signed)
____________________________________________________________________________________________  Medication Rules  Purpose: To inform patients, and their family members, of our rules and regulations.  Applies to: All patients receiving prescriptions (written or electronic).  Pharmacy of record: Pharmacy where electronic prescriptions will be sent. If written prescriptions are taken to a different pharmacy, please inform the nursing staff. The pharmacy listed in the electronic medical record should be the one where you would like electronic prescriptions to be sent.  Electronic prescriptions: In compliance with the [place]io[place] Misuse Prevention (STOP) Act of 2017 (Session Law 2017-74/H243), effective February 27, 2018, all controlled substances must be electronically prescribed. Calling prescriptions to the pharmacy will cease to exist.  Prescription refills: Only during scheduled appointments. Applies to all prescriptions.  NOTE: The following applies primarily to controlled substances (Opioid* Pain Medications).   Type of encounter (visit): For patients receiving controlled substances, face-to-face visits are required. (Not an option or up to the patient.)  Patient's responsibilities: 1. Pain Pills: Bring all pain pills to every appointment (except for procedure appointments). 2. Pill Bottles: Bring pills in original pharmacy bottle. Always bring the newest bottle. Bring bottle, even if empty. 3. Medication refills: You are responsible for knowing and keeping track of what medications you take and those you need refilled. The day before your appointment: write a list of all prescriptions that need to be refilled. The day of the appointment: give the list to the admitting nurse. Prescriptions will be written only during appointments. No prescriptions will be written on procedure days. If you forget a medication: it will not be "Called in", "Faxed", or "electronically sent".  You will need to get another appointment to get these prescribed. No early refills. Do not call asking to have your prescription filled early. 4. Prescription Accuracy: You are responsible for carefully inspecting your prescriptions before leaving our office. Have the discharge nurse carefully go over each prescription with you, before taking them home. Make sure that your name is accurately spelled, that your address is correct. Check the name and dose of your medication to make sure it is accurate. Check the number of pills, and the written instructions to make sure they are clear and accurate. Make sure that you are given enough medication to last until your next medication refill appointment. 5. Taking Medication: Take medication as prescribed. When it comes to controlled substances, taking less pills or less frequently than prescribed is permitted and encouraged. Never take more pills than instructed. Never take medication more frequently than prescribed.  6. Inform other Doctors: Always inform, all of your healthcare providers, of all the medications you take. 7. Pain Medication from other Providers: You are not allowed to accept any additional pain medication from any other Doctor or Healthcare provider. There are two exceptions to this rule. (see below) In the event that you require additional pain medication, you are responsible for notifying us, as stated below. 8. Cough Medicine: Often these contain an opioid, such as codeine or hydrocodone. Never accept or take cough medicine containing these opioids if you are already taking an opioid* medication. The combination may cause respiratory failure and death. 9. Medication Agreement: You are responsible for carefully reading and following our Medication Agreement. This must be signed before receiving any prescriptions from our practice. Safely store a copy of your signed Agreement. Violations to the Agreement will result in no further prescriptions.  (Additional copies of our Medication Agreement are available upon request.) 10. Laws, Rules, & Regulations: All patients are expected to follow all   Federal and State Laws, Statutes, Rules, & Regulations. Ignorance of the Laws does not constitute a valid excuse.  11. Illegal drugs and Controlled Substances: The use of illegal substances (including, but not limited to marijuana and its derivatives) and/or the illegal use of any controlled substances is strictly prohibited. Violation of this rule may result in the immediate and permanent discontinuation of any and all prescriptions being written by our practice. The use of any illegal substances is prohibited. 12. Adopted CDC guidelines & recommendations: Target dosing levels will be at or below 60 MME/day. Use of benzodiazepines** is not recommended.  Exceptions: There are only two exceptions to the rule of not receiving pain medications from other Healthcare Providers. 1. Exception #1 (Emergencies): In the event of an emergency (i.e.: accident requiring emergency care), you are allowed to receive additional pain medication. However, you are responsible for: As soon as you are able, call our office [phone], at any time of the day or night, and leave a message stating your name, the date and nature of the emergency, and the name and dose of the medication prescribed. In the event that your call is answered by a member of our staff, make sure to document and save the date, time, and the name of the person that took your information.  2. Exception #2 (Planned Surgery): In the event that you are scheduled by another doctor or dentist to have any type of surgery or procedure, you are allowed (for a period no longer than 30 days), to receive additional pain medication, for the acute post-op pain. However, in this case, you are responsible for picking up a copy of our "Post-op Pain Management for Surgeons" handout, and giving it to your surgeon or dentist. This  document is available at our office, and does not require an appointment to obtain it. Simply go to our office during business hours (Monday-Thursday from 8:00 AM to 4:00 PM) (Friday 8:00 AM to 12:00 Noon) or if you have a scheduled appointment with us, prior to your surgery, and ask for it by name. In addition, you are responsible for: calling our office [phone], at any time of the day or night, and leaving a message stating your name, name of your surgeon, type of surgery, and date of procedure or surgery. Failure to comply with your responsibilities may result in termination of therapy involving the controlled substances.  *Opioid medications include: morphine, codeine, oxycodone, oxymorphone, hydrocodone, hydromorphone, meperidine, tramadol, tapentadol, buprenorphine, fentanyl, methadone. **Benzodiazepine medications include: diazepam (Valium), alprazolam (Xanax), clonazepam (Klonopine), lorazepam (Ativan), clorazepate (Tranxene), chlordiazepoxide (Librium), estazolam (Prosom), oxazepam (Serax), temazepam (Restoril), triazolam (Halcion) (Last updated: 01/26/2020) ____________________________________________________________________________________________   ____________________________________________________________________________________________  Medication Recommendations and Reminders  Applies to: All patients receiving prescriptions (written and/or electronic).  Medication Rules & Regulations: These rules and regulations exist for your safety and that of others. They are not flexible and neither are we. Dismissing or ignoring them will be considered "non-compliance" with medication therapy, resulting in complete and irreversible termination of such therapy. (See document titled "Medication Rules" for more details.) In all conscience, because of safety reasons, we cannot continue providing a therapy where the patient does not follow instructions.  Pharmacy of record:   Definition:  This is the pharmacy where your electronic prescriptions will be sent.   We do not endorse any particular pharmacy, however, we have experienced problems with [facility] not securing enough medication supply for the community.  We do not restrict you in your choice of pharmacy. However,   once we write for your prescriptions, we will NOT be re-sending more prescriptions to fix restricted supply problems created by your pharmacy, or your insurance.   The pharmacy listed in the electronic medical record should be the one where you want electronic prescriptions to be sent.  If you choose to change pharmacy, simply notify our nursing staff.  Recommendations:  Keep all of your pain medications in a safe place, under lock and key, even if you live alone. We will NOT replace lost, stolen, or damaged medication.  After you fill your prescription, take 1 week's worth of pills and put them away in a safe place. You should keep a separate, properly labeled bottle for this purpose. The remainder should be kept in the original bottle. Use this as your primary supply, until it runs out. Once it's gone, then you know that you have 1 week's worth of medicine, and it is time to come in for a prescription refill. If you do this correctly, it is unlikely that you will ever run out of medicine.  To make sure that the above recommendation works, it is very important that you make sure your medication refill appointments are scheduled at least 1 week before you run out of medicine. To do this in an effective manner, make sure that you do not leave the office without scheduling your next medication management appointment. Always ask the nursing staff to show you in your prescription , when your medication will be running out. Then arrange for the receptionist to get you a return appointment, at least 7 days before you run out of medicine. Do not wait until you have 1 or 2 pills left, to come in. This is very poor planning and  does not take into consideration that we may need to cancel appointments due to bad weather, sickness, or emergencies affecting our staff.  DO NOT ACCEPT A "Partial Fill": If for any reason your pharmacy does not have enough pills/tablets to completely fill or refill your prescription, do not allow for a "partial fill". The law allows the pharmacy to complete that prescription within 72 hours, without requiring a new prescription. If they do not fill the rest of your prescription within those 72 hours, you will need a separate prescription to fill the remaining amount, which we will NOT provide. If the reason for the partial fill is your insurance, you will need to talk to the pharmacist about payment alternatives for the remaining tablets, but again, DO NOT ACCEPT A PARTIAL FILL, unless you can trust your pharmacist to obtain the remainder of the pills within 72 hours.  Prescription refills and/or changes in medication(s):   Prescription refills, and/or changes in dose or medication, will be conducted only during scheduled medication management appointments. (Applies to both, written and electronic prescriptions.)  No refills on procedure days. No medication will be changed or started on procedure days. No changes, adjustments, and/or refills will be conducted on a procedure day. Doing so will interfere with the diagnostic portion of the procedure.  No phone refills. No medications will be "called into the pharmacy".  No Fax refills.  No weekend refills.  No Holliday refills.  No after hours refills.  Remember:  Business hours are:  Monday to Thursday 8:00 AM to 4:00 PM Provider's Schedule: Arlena Marsan, MD - Appointments are:  Medication management: Monday and Wednesday 8:00 AM to 4:00 PM Procedure day: Tuesday and Thursday 7:30 AM to 4:00 PM Bilal Lateef, MD - Appointments are:    Medication management: Tuesday and Thursday 8:00 AM to 4:00 PM Procedure day: Monday and Wednesday  7:30 AM to 4:00 PM (Last update: 09/17/2019) ____________________________________________________________________________________________   ____________________________________________________________________________________________  CBD (cannabidiol) WARNING  Applicable to: All individuals currently taking or considering taking CBD (cannabidiol) and, more important, all patients taking opioid analgesic controlled substances (pain medication). (Example: oxycodone; oxymorphone; hydrocodone; hydromorphone; morphine; methadone; tramadol; tapentadol; fentanyl; buprenorphine; butorphanol; dextromethorphan; meperidine; codeine; etc.)  Legal status: CBD remains a Schedule I drug prohibited for any use. CBD is illegal with one exception. In the [place], CBD has a limited Food and Drug Administration (FDA) approval for the treatment of two specific types of epilepsy disorders. Only one CBD product has been approved by the FDA for this purpose: "Epidiolex". FDA is aware that some companies are marketing products containing cannabis and cannabis-derived compounds in ways that violate the Federal Food, Drug and Cosmetic Act (FD&C Act) and that may put the health and safety of consumers at risk. The FDA, a Federal agency, has not enforced the CBD status since 2018.   Legality: Some manufacturers ship CBD products nationally, which is illegal. Often such products are sold online and are therefore available throughout the country. CBD is openly sold in head shops and health food stores in some states where such sales have not been explicitly legalized. Selling unapproved products with unsubstantiated therapeutic claims is not only a violation of the law, but also can put patients at risk, as these products have not been proven to be safe or effective. Federal illegality makes it difficult to conduct research on CBD.  Reference: "FDA Regulation of Cannabis and Cannabis-Derived Products, Including Cannabidiol  (CBD)" - https://www.fda.gov/news-events/public-health-focus/fda-regulation-cannabis-and-cannabis-derived-products-including-cannabidiol-cbd  Warning: CBD is not FDA approved and has not undergo the same manufacturing controls as prescription drugs.  This means that the purity and safety of available CBD may be questionable. Most of the time, despite manufacturer's claims, it is contaminated with THC (delta-9-tetrahydrocannabinol - the chemical in marijuana responsible for the "HIGH").  When this is the case, the THC contaminant will trigger a positive urine drug screen (UDS) test for Marijuana (carboxy-THC). Because a positive UDS for any illicit substance is a violation of our medication agreement, your opioid analgesics (pain medicine) may be permanently discontinued.  MORE ABOUT CBD  General Information: CBD  is a derivative of the Marijuana (cannabis sativa) plant discovered in 1940. It is one of the 113 identified substances found in Marijuana. It accounts for up to 40% of the plant's extract. As of 2018, preliminary clinical studies on CBD included research for the treatment of anxiety, movement disorders, and pain. CBD is available and consumed in multiple forms, including inhalation of smoke or vapor, as an aerosol spray, and by mouth. It may be supplied as an oil containing CBD, capsules, dried cannabis, or as a liquid solution. CBD is thought not to be as psychoactive as THC (delta-9-tetrahydrocannabinol - the chemical in marijuana responsible for the "HIGH"). Studies suggest that CBD may interact with different biological target receptors in the body, including cannabinoid and other neurotransmitter receptors. As of 2018 the mechanism of action for its biological effects has not been determined.  Side-effects  Adverse reactions: Dry mouth, diarrhea, decreased appetite, fatigue, drowsiness, malaise, weakness, sleep disturbances, and others.  Drug interactions: CBC may interact with other  medications such as blood-thinners. (Last update: 10/04/2019) ____________________________________________________________________________________________   ____________________________________________________________________________________________  Drug Holidays (Slow)  What is a "Drug Holiday"? Drug Holiday: is the name given to the period of time during   which a patient stops taking a medication(s) for the purpose of eliminating tolerance to the drug.  Benefits . Improved effectiveness of opioids. . Decreased opioid dose needed to achieve benefits. . Improved pain with lesser dose.  What is tolerance? Tolerance: is the progressive decreased in effectiveness of a drug due to its repetitive use. With repetitive use, the body gets use to the medication and as a consequence, it loses its effectiveness. This is a common problem seen with opioid pain medications. As a result, a larger dose of the drug is needed to achieve the same effect that used to be obtained with a smaller dose.  How long should a "Drug Holiday" last? You should stay off of the pain medicine for at least 14 consecutive days. (2 weeks)  Should I stop the medicine "cold turkey"? No. You should always coordinate with your Pain Specialist so that he/she can provide you with the correct medication dose to make the transition as smoothly as possible.  How do I stop the medicine? Slowly. You will be instructed to decrease the daily amount of pills that you take by one (1) pill every seven (7) days. This is called a "slow downward taper" of your dose. For example: if you normally take four (4) pills per day, you will be asked to drop this dose to three (3) pills per day for seven (7) days, then to two (2) pills per day for seven (7) days, then to one (1) per day for seven (7) days, and at the end of those last seven (7) days, this is when the "Drug Holiday" would start.   Will I have withdrawals? By doing a "slow downward  taper" like this one, it is unlikely that you will experience any significant withdrawal symptoms. Typically, what triggers withdrawals is the sudden stop of a high dose opioid therapy. Withdrawals can usually be avoided by slowly decreasing the dose over a prolonged period of time. If you do not follow these instructions and decide to stop your medication abruptly, withdrawals may be possible.  What are withdrawals? Withdrawals: refers to the wide range of symptoms that occur after stopping or dramatically reducing opiate drugs after heavy and prolonged use. Withdrawal symptoms do not occur to patients that use low dose opioids, or those who take the medication sporadically. Contrary to benzodiazepine (example: Valium, Xanax, etc.) or alcohol withdrawals ("Delirium Tremens"), opioid withdrawals are not lethal. Withdrawals are the physical manifestation of the body getting rid of the excess receptors.  Expected Symptoms Early symptoms of withdrawal may include: . Agitation . Anxiety . Muscle aches . Increased tearing . Insomnia . Runny nose . Sweating . Yawning  Late symptoms of withdrawal may include: . Abdominal cramping . Diarrhea . Dilated pupils . Goose bumps . Nausea . Vomiting  Will I experience withdrawals? Due to the slow nature of the taper, it is very unlikely that you will experience any.  What is a slow taper? Taper: refers to the gradual decrease in dose.  (Last update: 09/17/2019) ____________________________________________________________________________________________     

## 2020-08-07 LAB — TOXASSURE SELECT 13 (MW), URINE

## 2020-08-17 NOTE — Progress Notes (Signed)
Remote pacemaker transmission.   

## 2020-08-31 ENCOUNTER — Other Ambulatory Visit: Payer: Self-pay

## 2020-08-31 ENCOUNTER — Encounter: Payer: Self-pay | Admitting: Internal Medicine

## 2020-08-31 ENCOUNTER — Ambulatory Visit (INDEPENDENT_AMBULATORY_CARE_PROVIDER_SITE_OTHER): Payer: Medicare Other | Admitting: Internal Medicine

## 2020-08-31 VITALS — BP 142/80 | HR 75 | Ht 61.0 in | Wt 206.6 lb

## 2020-08-31 DIAGNOSIS — Z950 Presence of cardiac pacemaker: Secondary | ICD-10-CM

## 2020-08-31 DIAGNOSIS — I4821 Permanent atrial fibrillation: Secondary | ICD-10-CM

## 2020-08-31 DIAGNOSIS — I10 Essential (primary) hypertension: Secondary | ICD-10-CM | POA: Diagnosis not present

## 2020-08-31 DIAGNOSIS — R001 Bradycardia, unspecified: Secondary | ICD-10-CM | POA: Diagnosis not present

## 2020-08-31 MED ORDER — APIXABAN 5 MG PO TABS: 5.0000 mg | ORAL_TABLET | Freq: Two times a day (BID) | ORAL | 3 refills | Status: AC

## 2020-08-31 NOTE — Patient Instructions (Signed)

## 2020-08-31 NOTE — Progress Notes (Signed)
Patient ID: [name], female   DOB: November 05, 1940, 80 y.o.   MRN: [id number]     Patient Care Team: Bryson Corona, NP as PCP - General (Family Medicine) Wellington Hampshire, MD as PCP - Cardiology (Cardiology) Deboraha Sprang, MD as PCP - Electrophysiology (Cardiology)   HPI  [name] is a 80 y.o. female Seen in follow-up for pacemaker implanted 2010 by Dr. Elliot Cousin for bradycardia in the context of permanent atrial fibrillation.  Underwent generator replacement 3/21 anticoagulated with apixoban << warfarin ( 9/20)       Thromboembolic risk factors ( age  -2, HTN-1, Gender-1) for a CHADSVASc Score of >=4  Today, the patient denies chest pain, shortness of breath, nocturnal dyspnea, orthopnea or peripheral edema.  There have been no palpitations, lightheadedness or syncope.  Activity is limited   On her at home bp her systolic ranges approximately 120s  She notes she has a walking device but do not use it at home for walking   No bleeding on Apixoban  Date Cr  K Hgb  8/19 1.1  12.1  1/20 1.05 4.1 14.9  9/20 1.14 3.5   2/21 1.11 4.4 14.3  9/21 (scanned)  1.0 4.3 13.6  5/22 1.19 4.7 13.6    Past Medical History:  Diagnosis Date   Anxiety    Aortic insufficiency    Trivial   Arthritis    Asthma    Bronchitis    Bursitis, hip    Claustrophobia    Claustrophobia    Dysrhythmia    a fib   GERD (gastroesophageal reflux disease)    history of   Gout    Hyperlipidemia    Hypertension    Hypothyroidism    Mitral regurgitation    Obesity    Panic attacks    Paroxysmal atrial fibrillation (HCC)    PTSD (post-traumatic stress disorder)    Rhinitis    Sick sinus syndrome ([place])    Sleep apnea    was ordered CPAP, but never fitted for one years ago, Dr has retired    Past Surgical History:  Procedure Laterality Date   [place] N/A 08/03/2015   Procedure: [place];  Surgeon: Alden Hipp, MD;  Location: [facility];  Service: Gynecology;  Laterality: N/A;  Patient has an ICD    INSERT / REPLACE / REMOVE PACEMAKER     PACEMAKER INSERTION     Status post insertion   PPM GENERATOR CHANGEOUT N/A 05/02/2019   Procedure: PPM GENERATOR CHANGEOUT;  Surgeon: Deboraha Sprang, MD;  Location: [facility];  Service: Cardiovascular;  Laterality: N/A;   ROTATOR CUFF REPAIR     left   Status post child birth x1     TONSILLECTOMY     TOTAL HIP ARTHROPLASTY Left 12/22/2015   Procedure: TOTAL HIP ARTHROPLASTY;  Surgeon: Dereck Leep, MD;  Location: [facility];  Service: Orthopedics;  Laterality: Left;   TOTAL HIP REVISION Left 03/01/2018   Procedure: TOTAL HIP REVISION;  Surgeon: Dereck Leep, MD;  Location: [facility];  Service: Orthopedics;  Laterality: Left;    Current Meds  Medication Sig   acetaminophen (TYLENOL) 500 MG tablet Take 1,000 mg by mouth every 6 (six) hours as needed for headache.   allopurinol (ZYLOPRIM) 100 MG tablet Take 100 mg by mouth 2 (two) times daily.    ELIQUIS 5 MG TABS tablet TAKE 1 TABLET TWICE  A DAY   liothyronine (CYTOMEL) 5 MCG tablet Take 15 mcg by mouth daily.    metoprolol tartrate (LOPRESSOR) 50 MG tablet TAKE ONE AND ONE-HALF TABLETS TWICE A DAY   oxyCODONE (OXY IR/ROXICODONE) 5 MG immediate release tablet Take 1 tablet (5 mg total) by mouth every 6 (six) hours as needed for severe pain. Must last 30 days   [START ON 09/01/2020] oxyCODONE (OXY IR/ROXICODONE) 5 MG immediate release tablet Take 1 tablet (5 mg total) by mouth every 6 (six) hours as needed for severe pain. Must last 30 days   [START ON 10/01/2020] oxyCODONE (OXY IR/ROXICODONE) 5 MG immediate release tablet Take 1 tablet (5 mg total) by mouth every 6 (six) hours as needed for severe pain. Must last 30 days   PLAQUENIL 200 MG tablet Take 200 mg by mouth 2 (two) times daily.   spironolactone (ALDACTONE) 100 MG tablet Take 100 mg by mouth daily.   thyroid (ARMOUR)  60 MG tablet Take 60 mg by mouth daily before breakfast.     Allergies  Allergen Reactions   Contrast Media [Iodinated Diagnostic Agents] Rash    Contrast dye causes swelling    Review of Systems negative except from HPI and PMH  Physical Exam: BP (!) 142/80   Pulse 75   Ht 5\' 1"  (1.549 m)   Wt 206 lb 9.6 oz (93.7 kg)   SpO2 95%   BMI 39.04 kg/m  Well developed and Morbidly obese  in no acute distress HENT normal Neck supple with JVP-flat Lungs Clear Device pocket well healed; without hematoma or erythema.  There is no tethering  Regular rate and rhythm, no  gallop and  No / murmur Abd-soft with active BS No Clubbing cyanosis No edema Skin-warm and dry A & Oriented  Grossly normal sensory and motor function  DXI:PJASNK fib @ 75 -/10/41  Assessment and  Plan:  Atrial fibrillation-permanent  Bradycardia symptomatic  High Risk Medication Surveillance Spironolactone   Pacemaker St Jude    Atrial fibrillation permanent.  Rate control adequate.  10% ventricular pacing so we will not push up her metoprolol.  Continue Lopressor 75 twice daily.  No clinical bleeding.  We will continue her Eliquis at 5 mg twice daily; age and renal function appropriate for this dose  Blood pressure well controlled at home.  Continue her metoprolol 75 twice daily and her Aldactone 100 mg daily Surveillance laboratories for this drug are normal 5/22  I,Stephanie Williams,acting as a scribe for Virl Axe, MD.,have documented all relevant documentation on the behalf of Virl Axe, MD,as directed by  Virl Axe, MD while in the presence of Virl Axe, MD.   I, Virl Axe, MD, have reviewed all documentation for this visit. The documentation on 08/31/20 for the exam, diagnosis, procedures, and orders are all accurate and complete.

## 2020-09-22 ENCOUNTER — Telehealth: Payer: Self-pay

## 2020-09-22 NOTE — Telephone Encounter (Signed)
Called patient, instructed her to call her PCP to have [facility] refill. Dr Dossie Arbour only prescribing narcs Called patient. Advised patient of provider's approval for requested procedure, as well as any comments/instructions from provider.  Patient with understanding

## 2020-09-22 NOTE — Telephone Encounter (Signed)
[facility] Lubiprostone '8mg'$  2x a day

## 2020-10-25 NOTE — Progress Notes (Signed)
Patient: [name]  Service Category: E/M  Provider: Gaspar Cola, MD  DOB: [birth year]  DOS: 10/27/2020  Location: Office  MRN: [id number]  Setting: Ambulatory outpatient  Referring Provider: Bryson Corona, NP  Type: Established Patient  Specialty: Interventional Pain Management  PCP: Bryson Corona, NP  Location: Remote location  Delivery: TeleHealth     Virtual Encounter - Pain Management PROVIDER NOTE: Information contained herein reflects review and annotations entered in association with encounter. Interpretation of such information and data should be left to medically-trained personnel. Information provided to patient can be located elsewhere in the medical record under "Patient Instructions". Document created using STT-dictation technology, any transcriptional errors that may result from process are unintentional.    Contact & Pharmacy Preferred: [phone] Home: [phone] (home) Mobile: [phone] (mobile) E-mail: [email or web address]  [facility] - [place], [place] [address] Phone: [phone] Fax: [phone]  [facility] [place], [place] [address] [place] [address] Phone: [phone] Fax: [phone]   Pre-screening  [name] offered "in-person" vs "virtual" encounter. She indicated preferring virtual for this encounter.   Reason COVID-19*  Social distancing based on CDC and AMA recommendations.   I contacted Avonna Witthuhn on 10/27/2020 via telephone.      I clearly identified myself as Gaspar Cola, MD. I verified that I was speaking with the correct person using two identifiers (Name: [name], and date of birth: March 27, 1940).  Consent I sought verbal advanced consent from [name] for virtual visit interactions. I informed [name] of possible security and privacy concerns, risks, and limitations associated with  providing "not-in-person" medical evaluation and management services. I also informed [name] of the availability of "in-person" appointments. Finally, I informed her that there would be a charge for the virtual visit and that she could be  personally, fully or partially, financially responsible for it. [name] expressed understanding and agreed to proceed.   Historic Elements   [name] is a 80 y.o. year old, female patient evaluated today after our last contact on 08/02/2020. [name]  has a past medical history of Anxiety, Aortic insufficiency, Arthritis, Asthma, Bronchitis, Bursitis, hip, Claustrophobia, Claustrophobia, Dysrhythmia, GERD (gastroesophageal reflux disease), Gout, Hyperlipidemia, Hypertension, Hypothyroidism, Mitral regurgitation, Obesity, Panic attacks, Paroxysmal atrial fibrillation ([place]), PTSD (post-traumatic stress disorder), Rhinitis, Sick sinus syndrome ([place]), and Sleep apnea. She also  has a past surgical history that includes Tonsillectomy; Pacemaker insertion; Status post child birth x1; Rotator cuff repair; Dilatation & curettage/hysteroscopy with myosure (N/A, 08/03/2015); Insert / replace / remove pacemaker; Total hip arthroplasty (Left, 12/22/2015); Cardiac catheterization; Total hip revision (Left, 03/01/2018); and PPM GENERATOR CHANGEOUT (N/A, 05/02/2019). [name] has a current medication list which includes the following prescription(s): acetaminophen, allopurinol, apixaban, cyclobenzaprine, liothyronine, lubiprostone, metoprolol tartrate, [START ON 10/31/2020] oxycodone, plaquenil, pregabalin, spironolactone, and thyroid. She  reports that she has never smoked. She has never used smokeless tobacco. She reports that she does not drink alcohol and does not use drugs. [name] is allergic to contrast media [iodinated diagnostic agents].   HPI  Today, she is being contacted for medication management.  The patient indicates doing well with the current medication regimen. No  adverse reactions or side effects reported to the medications.   RTCB: 11/30/2020  Pharmacotherapy Assessment   Analgesic: Oxycodone IR 5 mg 1 tablet p.o. every 6 hours (20 mg/day of oxycodone)  MME/day:  30 mg/day.   Monitoring: [place] PMP: PDMP reviewed during this encounter.       Pharmacotherapy: No side-effects or adverse reactions reported. Compliance: No problems identified. Effectiveness: Clinically acceptable. Plan: Refer to "POC". UDS:  Summary  Date Value Ref Range Status  08/02/2020 Note  Final    Comment:    ==================================================================== [facility] Select 13 (MW) ==================================================================== Test                             Result       Flag       Units  Drug Present and Declared for Prescription Verification   Oxycodone                      219          EXPECTED   ng/mg creat   Oxymorphone                    1425         EXPECTED   ng/mg creat   Noroxycodone                   1244         EXPECTED   ng/mg creat   Noroxymorphone                 373          EXPECTED   ng/mg creat    Sources of oxycodone are scheduled prescription medications.    Oxymorphone, noroxycodone, and noroxymorphone are expected    metabolites of oxycodone. Oxymorphone is also available as a    scheduled prescription medication.  ==================================================================== Test                      Result    Flag   Units      Ref Range   Creatinine              121              mg/dL      >=20 ==================================================================== Declared Medications:  The flagging and interpretation on this report are based on the  following declared medications.  Unexpected results may arise from  inaccuracies in the declared medications.   **Note: The testing scope of this panel includes these medications:   Oxycodone   **Note: The testing scope of this panel does not  include the  following reported medications:   Acetaminophen (Tylenol)  Allopurinol (Zyloprim)  Apixaban (Eliquis)  Cyclobenzaprine (Flexeril)  Hydroxychloroquine (Plaquenil)  Liothyronine (Cytomel)  Lubiprostone (Amitiza)  Metoprolol (Lopressor)  Pregabalin (Lyrica)  Spironolactone (Aldactone)  Thyroid: Liothyronine/Levothyroxine (Armour) ==================================================================== For clinical consultation, please call [phone]. ====================================================================      Laboratory Chemistry Profile   Renal Lab Results  Component Value Date   BUN 20 04/22/2019   CREATININE 1.11 (H) 04/22/2019   BCR 18 04/22/2019   GFR 53.86 (L) 11/07/2013   GFRAA 55 (L) 04/22/2019   GFRNONAA 47 (L) 04/22/2019    Hepatic Lab Results  Component Value Date   AST 21 11/19/2018   ALT 14 11/19/2018   ALBUMIN 3.7 11/19/2018   ALKPHOS 57 11/19/2018    Electrolytes Lab Results  Component Value Date   NA 140 04/22/2019   K 4.4 04/22/2019   CL 102 04/22/2019   CALCIUM 9.7 04/22/2019   MG 1.5 (L) 11/19/2018  Bone Lab Results  Component Value Date   VD25OH 42.4 11/19/2018   25OHVITD1 39 03/07/2017   25OHVITD2 5.6 03/07/2017   25OHVITD3 33 03/07/2017    Inflammation (CRP: Acute Phase) (ESR: Chronic Phase) Lab Results  Component Value Date   CRP <0.8 11/19/2018   ESRSEDRATE 28 11/19/2018         Note: Above Lab results reviewed.  Imaging  CUP PACEART REMOTE DEVICE CHECK Scheduled remote reviewed. Normal device function.    Next remote 91 days. LH  Assessment  The primary encounter diagnosis was Chronic pain syndrome. Diagnoses of Chronic hip pain (1ry area of Pain) (Left), Chronic hip pain s/p THR  (Left), Spinal stenosis of lumbar region, unspecified whether neurogenic claudication present, DDD (degenerative disc disease), lumbosacral, Pharmacologic therapy, Chronic use of opiate for therapeutic purpose, and  Encounter for medication management were also pertinent to this visit.  Plan of Care  Problem-specific:  No problem-specific Assessment & Plan notes found for this encounter.  Ms. Virna Livengood has a current medication list which includes the following long-term medication(s): allopurinol, apixaban, cyclobenzaprine, lubiprostone, metoprolol tartrate, [START ON 10/31/2020] oxycodone, pregabalin, spironolactone, and thyroid.  Pharmacotherapy (Medications Ordered): Meds ordered this encounter  Medications   oxyCODONE (OXY IR/ROXICODONE) 5 MG immediate release tablet    Sig: Take 1 tablet (5 mg total) by mouth every 6 (six) hours as needed for severe pain. Must last 30 days    Dispense:  120 tablet    Refill:  0    Not a duplicate. Do NOT delete! Dispense 1 day early if closed on refill date. Avoid benzodiazepines within 8 hours of opioids. Do not send refill requests.    Orders:  No orders of the defined types were placed in this encounter.  Follow-up plan:   Return in about 5 weeks (around 11/30/2020) for Eval-day(M,W), (F2F), (MM).      Considering:   None at this time.    Palliative PRN treatment(s):   None at this time.       Recent Visits Date Type Provider Dept  08/02/20 Office Visit Milinda Pointer, MD [facility]  Showing recent visits within past 90 days and meeting all other requirements Today's Visits Date Type Provider Dept  10/27/20 Telemedicine Milinda Pointer, MD [facility]  Showing today's visits and meeting all other requirements Future Appointments Date Type Provider Dept  11/29/20 Appointment Milinda Pointer, MD [facility]  Showing future appointments within next 90 days and meeting all other requirements I discussed the assessment and treatment plan with the patient. The patient was provided an opportunity to ask questions and all were answered. The patient agreed with the plan and demonstrated an understanding of the  instructions.  Patient advised to call back or seek an in-person evaluation if the symptoms or condition worsens.  Duration of encounter: 12 minutes.  Note by: Gaspar Cola, MD Date: 10/27/2020; Time: 3:35 PM

## 2020-10-27 ENCOUNTER — Ambulatory Visit: Payer: Medicare Other | Attending: Pain Medicine | Admitting: Pain Medicine

## 2020-10-27 ENCOUNTER — Other Ambulatory Visit: Payer: Self-pay

## 2020-10-27 DIAGNOSIS — M5137 Other intervertebral disc degeneration, lumbosacral region: Secondary | ICD-10-CM

## 2020-10-27 DIAGNOSIS — Z79899 Other long term (current) drug therapy: Secondary | ICD-10-CM

## 2020-10-27 DIAGNOSIS — M25552 Pain in left hip: Secondary | ICD-10-CM

## 2020-10-27 DIAGNOSIS — Z79891 Long term (current) use of opiate analgesic: Secondary | ICD-10-CM

## 2020-10-27 DIAGNOSIS — M51379 Other intervertebral disc degeneration, lumbosacral region without mention of lumbar back pain or lower extremity pain: Secondary | ICD-10-CM

## 2020-10-27 DIAGNOSIS — G8929 Other chronic pain: Secondary | ICD-10-CM

## 2020-10-27 DIAGNOSIS — G894 Chronic pain syndrome: Secondary | ICD-10-CM

## 2020-10-27 DIAGNOSIS — M48061 Spinal stenosis, lumbar region without neurogenic claudication: Secondary | ICD-10-CM

## 2020-10-27 DIAGNOSIS — Z96642 Presence of left artificial hip joint: Secondary | ICD-10-CM

## 2020-10-27 MED ORDER — OXYCODONE HCL 5 MG PO TABS: 5.0000 mg | ORAL_TABLET | Freq: Four times a day (QID) | ORAL | 0 refills | Status: DC | PRN

## 2020-10-29 ENCOUNTER — Ambulatory Visit (INDEPENDENT_AMBULATORY_CARE_PROVIDER_SITE_OTHER): Payer: Medicare Other

## 2020-10-29 DIAGNOSIS — I495 Sick sinus syndrome: Secondary | ICD-10-CM | POA: Diagnosis not present

## 2020-10-29 DIAGNOSIS — I4821 Permanent atrial fibrillation: Secondary | ICD-10-CM

## 2020-11-02 LAB — CUP PACEART REMOTE DEVICE CHECK
Battery Remaining Longevity: 127 mo
Battery Remaining Percentage: 93 %
Battery Voltage: 3.02 V
Brady Statistic RV Percent Paced: 6.8 %
Date Time Interrogation Session: 20220902020013
Implantable Lead Implant Date: 20090317
Implantable Lead Implant Date: 20090317
Implantable Lead Location: 753859
Implantable Lead Location: 753860
Implantable Pulse Generator Implant Date: 20210305
Lead Channel Impedance Value: 480 Ohm
Lead Channel Pacing Threshold Amplitude: 0.75 V
Lead Channel Pacing Threshold Pulse Width: 0.4 ms
Lead Channel Sensing Intrinsic Amplitude: 10.8 mV
Lead Channel Setting Pacing Amplitude: 2.5 V
Lead Channel Setting Pacing Pulse Width: 0.4 ms
Lead Channel Setting Sensing Sensitivity: 2 mV
Pulse Gen Model: 2272
Pulse Gen Serial Number: 3802091

## 2020-11-03 ENCOUNTER — Encounter: Payer: Medicare Other | Admitting: Pain Medicine

## 2020-11-03 ENCOUNTER — Other Ambulatory Visit: Payer: Self-pay | Admitting: Pain Medicine

## 2020-11-03 DIAGNOSIS — G894 Chronic pain syndrome: Secondary | ICD-10-CM

## 2020-11-03 DIAGNOSIS — Z79891 Long term (current) use of opiate analgesic: Secondary | ICD-10-CM

## 2020-11-03 DIAGNOSIS — Z79899 Other long term (current) drug therapy: Secondary | ICD-10-CM

## 2020-11-09 NOTE — Progress Notes (Signed)
Remote pacemaker transmission.   

## 2020-11-26 NOTE — Progress Notes (Signed)
PROVIDER NOTE: Information contained herein reflects review and annotations entered in association with encounter. Interpretation of such information and data should be left to medically-trained personnel. Information provided to patient can be located elsewhere in the medical record under "Patient Instructions". Document created using STT-dictation technology, any transcriptional errors that may result from process are unintentional.    Patient: [name]  Service Category: E/M  Provider: Gaspar Cola, MD  DOB: 11-Jan-1941  DOS: 11/29/2020  Specialty: Interventional Pain Management  MRN: [id number]  Setting: Ambulatory outpatient  PCP: Bryson Corona, NP  Type: Established Patient    Referring Provider: Bryson Corona, NP  Location: Office  Delivery: Face-to-face     HPI  Ms. Kelyn Ponciano, a 80 y.o. year old female, is here today because of her Chronic left hip pain [M25.552, G89.29]. Ms. Starnes primary complain today is Joint Pain (Arms and legs) Last encounter: My last encounter with her was on 11/03/2020. Pertinent problems: Ms. Davern has Chronic hip pain (1ry area of Pain) (Left); Osteoarthritis of hip (Left); Trochanteric bursitis of hip (Left); Lumbar central spinal stenosis (L4-5); Lumbar facet arthropathy ([place]); Osteoarthritis of sacroiliac joint (with vacuum phenomena in) (Bilateral); Avascular necrosis of left femoral head ([place]); Spinal stenosis of lumbar region; S/P total hip arthroplasty; Chronic pain syndrome; S/P total hip replacement (Left); S/P revision of total hip; Chronic hip pain (Bilateral); Chronic hip pain s/p THR  (Left); Neurogenic pain; Chronic musculoskeletal pain; Muscle spasms of lower extremity (left hip); and DDD (degenerative disc disease), lumbosacral on their pertinent problem list. Pain Assessment: Severity of Chronic pain is reported as a 3 /10. Location: Leg (Joint pain, entire body, arthritis) Upper, Lower/Entire body. Onset: More than a month ago. Quality:  Aching, Throbbing. Timing: Intermittent (Worse in evening). Modifying factor(s): medication, heat. Vitals:  height is 5' 1"  (1.549 m) and weight is 188 lb (85.3 kg). Her temperature is 96.6 F (35.9 C) (abnormal). Her blood pressure is 138/80 and her pulse is 100. Her respiration is 18 and oxygen saturation is 100%.   Reason for encounter: medication management.   The patient indicates doing well with the current medication regimen. No adverse reactions or side effects reported to the medications.   RTCB: 02/28/2021 Nonopioids transfer 12/22/2019: Flexeril and Lyrica  Pharmacotherapy Assessment  Analgesic: Oxycodone IR 5 mg 1 tablet p.o. every 6 hours (20 mg/day of oxycodone)  MME/day: 30 mg/day.   Monitoring: [place] PMP: PDMP reviewed during this encounter.       Pharmacotherapy: No side-effects or adverse reactions reported. Compliance: No problems identified. Effectiveness: Clinically acceptable.  Ignatius Specking, RN  11/29/2020  2:10 PM  Sign when Signing Visit Nursing Pain Medication Assessment:  Safety precautions to be maintained throughout the outpatient stay will include: orient to surroundings, keep bed in low position, maintain call bell within reach at all times, provide assistance with transfer out of bed and ambulation.  Medication Inspection Compliance: Pill count conducted under aseptic conditions, in front of the patient. Neither the pills nor the bottle was removed from the patient's sight at any time. Once count was completed pills were immediately returned to the patient in their original bottle.  Medication: Oxycodone IR Pill/Patch Count:  20 of 120 pills remain Pill/Patch Appearance: Markings consistent with prescribed medication Bottle Appearance: Standard pharmacy container. Clearly labeled. Filled Date: 2 / 07 / 2022 Last Medication intake:  Today     UDS:  Summary  Date Value Ref Range Status  08/02/2020 Note  Final  Comment:     ==================================================================== [employer] 13 (MW) ==================================================================== Test                             Result       Flag       Units  Drug Present and Declared for Prescription Verification   Oxycodone                      219          EXPECTED   ng/mg creat   Oxymorphone                    1425         EXPECTED   ng/mg creat   Noroxycodone                   1244         EXPECTED   ng/mg creat   Noroxymorphone                 373          EXPECTED   ng/mg creat    Sources of oxycodone are scheduled prescription medications.    Oxymorphone, noroxycodone, and noroxymorphone are expected    metabolites of oxycodone. Oxymorphone is also available as a    scheduled prescription medication.  ==================================================================== Test                      Result    Flag   Units      Ref Range   Creatinine              121              mg/dL      >=20 ==================================================================== Declared Medications:  The flagging and interpretation on this report are based on the  following declared medications.  Unexpected results may arise from  inaccuracies in the declared medications.   **Note: The testing scope of this panel includes these medications:   Oxycodone   **Note: The testing scope of this panel does not include the  following reported medications:   Acetaminophen (Tylenol)  Allopurinol (Zyloprim)  Apixaban (Eliquis)  Cyclobenzaprine (Flexeril)  Hydroxychloroquine (Plaquenil)  Liothyronine (Cytomel)  Lubiprostone (Amitiza)  Metoprolol (Lopressor)  Pregabalin (Lyrica)  Spironolactone (Aldactone)  Thyroid: Liothyronine/Levothyroxine ([place]) ==================================================================== For clinical consultation, please call [phone]. ====================================================================      ROS  Constitutional: Denies any fever or chills Gastrointestinal: No reported hemesis, hematochezia, vomiting, or acute GI distress Musculoskeletal: Denies any acute onset joint swelling, redness, loss of ROM, or weakness Neurological: No reported episodes of acute onset apraxia, aphasia, dysarthria, agnosia, amnesia, paralysis, loss of coordination, or loss of consciousness  Medication Review  acetaminophen, allopurinol, apixaban, cyclobenzaprine, hydroxychloroquine, liothyronine, lubiprostone, metoprolol tartrate, oxyCODONE, pregabalin, spironolactone, and thyroid  History Review  Allergy: Ms. Sircy is allergic to contrast media [iodinated diagnostic agents]. Drug: Ms. Lathon  reports no history of drug use. Alcohol:  reports no history of alcohol use. Tobacco:  reports that she has never smoked. She has never used smokeless tobacco. Social: Ms. Swint  reports that she has never smoked. She has never used smokeless tobacco. She reports that she does not drink alcohol and does not use drugs. Medical:  has a past medical history of Anxiety, Aortic insufficiency, Arthritis, Asthma, Bronchitis, Bursitis, hip, Claustrophobia, Claustrophobia, Dysrhythmia, GERD (gastroesophageal reflux  disease), Gout, Hyperlipidemia, Hypertension, Hypothyroidism, Mitral regurgitation, Obesity, Panic attacks, Paroxysmal atrial fibrillation (HCC), PTSD (post-traumatic stress disorder), Rhinitis, Sick sinus syndrome ([place]), and Sleep apnea. Surgical: Ms. Connelly  has a past surgical history that includes Tonsillectomy; Pacemaker insertion; Status post child birth x1; Rotator cuff repair; Dilatation & curettage/hysteroscopy with myosure (N/A, 08/03/2015); Insert / replace / remove pacemaker; Total hip arthroplasty (Left, 12/22/2015); Cardiac catheterization; Total hip revision (Left, 03/01/2018); and PPM GENERATOR CHANGEOUT (N/A,  05/02/2019). Family: family history includes Alzheimer's disease in her father; Arthritis in her sister; Cancer in her brother and maternal grandfather; Heart disease in her mother; Hypertension in her sister; Stroke in her mother.  Laboratory Chemistry Profile   Renal Lab Results  Component Value Date   BUN 20 04/22/2019   CREATININE 1.11 (H) 04/22/2019   BCR 18 04/22/2019   GFR 53.86 (L) 11/07/2013   GFRAA 55 (L) 04/22/2019   GFRNONAA 47 (L) 04/22/2019    Hepatic Lab Results  Component Value Date   AST 21 11/19/2018   ALT 14 11/19/2018   ALBUMIN 3.7 11/19/2018   ALKPHOS 57 11/19/2018    Electrolytes Lab Results  Component Value Date   NA 140 04/22/2019   K 4.4 04/22/2019   CL 102 04/22/2019   CALCIUM 9.7 04/22/2019   MG 1.5 (L) 11/19/2018    Bone Lab Results  Component Value Date   VD25OH 42.4 11/19/2018   25OHVITD1 39 03/07/2017   25OHVITD2 5.6 03/07/2017   25OHVITD3 33 03/07/2017    Inflammation (CRP: Acute Phase) (ESR: Chronic Phase) Lab Results  Component Value Date   CRP <0.8 11/19/2018   ESRSEDRATE 28 11/19/2018         Note: Above Lab results reviewed.  Recent Imaging Review  CUP PACEART REMOTE DEVICE CHECK Scheduled remote reviewed. Normal device function.    Next remote 91 days. LR Note: Reviewed        Physical Exam  General appearance: Well nourished, well developed, and well hydrated. In no apparent acute distress Mental status: Alert, oriented x 3 (person, place, & time)       Respiratory: No evidence of acute respiratory distress Eyes: PERLA Vitals: BP 138/80   Pulse 100   Temp (!) 96.6 F (35.9 C)   Resp 18   Ht 5' 1"  (1.549 m)   Wt 188 lb (85.3 kg)   SpO2 100%   BMI 35.52 kg/m  BMI: Estimated body mass index is 35.52 kg/m as calculated from the following:   Height as of this encounter: 5' 1"  (1.549 m).   Weight as of this encounter: 188 lb (85.3 kg). Ideal: Ideal body weight: 47.8 kg (105 lb 6.1 oz) Adjusted ideal body  weight: 62.8 kg (138 lb 6.8 oz)  Assessment   Status Diagnosis  Controlled Controlled Controlled 1. Chronic hip pain (1ry area of Pain) (Left)   2. Chronic hip pain (Bilateral)   3. Chronic musculoskeletal pain   4. Chronic hip pain s/p THR  (Left)   5. Chronic pain syndrome   6. Pharmacologic therapy   7. Chronic use of opiate for therapeutic purpose   8. Encounter for chronic pain management   9. Encounter for medication management      Updated Problems: No problems updated.  Plan of Care  Problem-specific:  No problem-specific Assessment & Plan notes found for this encounter.  Ms. Amyah Clawson has a current medication list which includes the following long-term medication(s): allopurinol, apixaban, metoprolol tartrate, [START ON 11/30/2020] oxycodone, [START ON 12/30/2020]  oxycodone, [START ON 01/29/2021] oxycodone, spironolactone, thyroid, cyclobenzaprine, lubiprostone, and pregabalin.  Pharmacotherapy (Medications Ordered): Meds ordered this encounter  Medications   oxyCODONE (OXY IR/ROXICODONE) 5 MG immediate release tablet    Sig: Take 1 tablet (5 mg total) by mouth every 6 (six) hours as needed for severe pain. Must last 30 days    Dispense:  120 tablet    Refill:  0    Not a duplicate. Do NOT delete! Dispense 1 day early if closed on fill date. Warn: no CNS-depressants within 8 hrs of opioid. Do not send refill request. Renewal requires appointment. No partial fills allowed   oxyCODONE (OXY IR/ROXICODONE) 5 MG immediate release tablet    Sig: Take 1 tablet (5 mg total) by mouth every 6 (six) hours as needed for severe pain. Must last 30 days    Dispense:  120 tablet    Refill:  0    Not a duplicate. Do NOT delete! Dispense 1 day early if closed on fill date. Warn: no CNS-depressants within 8 hrs of opioid. Do not send refill request. Renewal requires appointment. No partial fills allowed   oxyCODONE (OXY IR/ROXICODONE) 5 MG immediate release tablet    Sig: Take 1  tablet (5 mg total) by mouth every 6 (six) hours as needed for severe pain. Must last 30 days    Dispense:  120 tablet    Refill:  0    Not a duplicate. Do NOT delete! Dispense 1 day early if closed on fill date. Warn: no CNS-depressants within 8 hrs of opioid. Do not send refill request. Renewal requires appointment. No partial fills allowed    Orders:  No orders of the defined types were placed in this encounter.  Follow-up plan:   Return in about 13 weeks (around 02/28/2021) for Eval-day (M,W), (F2F), (MM).     Considering:   None at this time.    Palliative PRN treatment(s):   None at this time.     Recent Visits Date Type Provider Dept  10/27/20 Telemedicine Milinda Pointer, MD [facility]  Showing recent visits within past 90 days and meeting all other requirements Today's Visits Date Type Provider Dept  11/29/20 Office Visit Milinda Pointer, MD [facility]  Showing today's visits and meeting all other requirements Future Appointments No visits were found meeting these conditions. Showing future appointments within next 90 days and meeting all other requirements I discussed the assessment and treatment plan with the patient. The patient was provided an opportunity to ask questions and all were answered. The patient agreed with the plan and demonstrated an understanding of the instructions.  Patient advised to call back or seek an in-person evaluation if the symptoms or condition worsens.  Duration of encounter: 30 minutes.  Note by: Gaspar Cola, MD Date: 11/29/2020; Time: 2:22 PM

## 2020-11-26 NOTE — Patient Instructions (Signed)

## 2020-11-29 ENCOUNTER — Other Ambulatory Visit: Payer: Self-pay

## 2020-11-29 ENCOUNTER — Ambulatory Visit: Payer: Medicare Other | Attending: Pain Medicine | Admitting: Pain Medicine

## 2020-11-29 VITALS — BP 138/80 | HR 100 | Temp 96.6°F | Resp 18 | Ht 61.0 in | Wt 188.0 lb

## 2020-11-29 DIAGNOSIS — Z96642 Presence of left artificial hip joint: Secondary | ICD-10-CM | POA: Insufficient documentation

## 2020-11-29 DIAGNOSIS — M25551 Pain in right hip: Secondary | ICD-10-CM | POA: Insufficient documentation

## 2020-11-29 DIAGNOSIS — G8929 Other chronic pain: Secondary | ICD-10-CM

## 2020-11-29 DIAGNOSIS — M25552 Pain in left hip: Secondary | ICD-10-CM | POA: Insufficient documentation

## 2020-11-29 DIAGNOSIS — M7918 Myalgia, other site: Secondary | ICD-10-CM | POA: Insufficient documentation

## 2020-11-29 DIAGNOSIS — Z79899 Other long term (current) drug therapy: Secondary | ICD-10-CM

## 2020-11-29 DIAGNOSIS — G894 Chronic pain syndrome: Secondary | ICD-10-CM

## 2020-11-29 DIAGNOSIS — Z79891 Long term (current) use of opiate analgesic: Secondary | ICD-10-CM

## 2020-11-29 MED ORDER — OXYCODONE HCL 5 MG PO TABS: 5.0000 mg | ORAL_TABLET | Freq: Four times a day (QID) | ORAL | 0 refills | Status: AC | PRN

## 2020-11-29 NOTE — Progress Notes (Signed)
Nursing Pain Medication Assessment:  Safety precautions to be maintained throughout the outpatient stay will include: orient to surroundings, keep bed in low position, maintain call bell within reach at all times, provide assistance with transfer out of bed and ambulation.  Medication Inspection Compliance: Pill count conducted under aseptic conditions, in front of the patient. Neither the pills nor the bottle was removed from the patient's sight at any time. Once count was completed pills were immediately returned to the patient in their original bottle.  Medication: Oxycodone IR Pill/Patch Count:  20 of 120 pills remain Pill/Patch Appearance: Markings consistent with prescribed medication Bottle Appearance: Standard pharmacy container. Clearly labeled. Filled Date: 28 / 07 / 2022 Last Medication intake:  Today

## 2021-01-04 ENCOUNTER — Telehealth: Payer: Self-pay | Admitting: Cardiovascular Disease

## 2021-01-04 NOTE — Telephone Encounter (Signed)
   Patient Name: [name]  DOB: [birth year] MRN: [id number]  Primary Cardiologist: Kathlyn [place], MD  Chart reviewed as part of pre-operative protocol coverage.   Simple (1-2 teeth) dental extractions are considered low risk procedures per guidelines and generally do not require any specific cardiac clearance. It is also generally accepted that for simple extractions and dental cleanings, there is no need to interrupt blood thinner therapy.  SBE prophylaxis is not required for the patient from a cardiac standpoint.  I will route this recommendation to the requesting party via Epic fax function and remove from pre-op pool.  Please call with questions.  [place], [place] 01/04/2021, 6:15 PM

## 2021-01-04 NOTE — Telephone Encounter (Signed)
   [facility] Pre-operative Risk Assessment    Patient Name: [name]  DOB: 1941-02-20 MRN: [id number]  HEARTCARE STAFF:  - IMPORTANT!!!!!! Under Visit Info/Reason for Call, type in Other and utilize the format Clearance MM/DD/YY or Clearance TBD. Do not use dashes or single digits. - Please review there is not already an duplicate clearance open for this procedure. - If request is for dental extraction, please clarify the # of teeth to be extracted. - If the patient is currently at the dentist's office, call Pre-Op Callback Staff (MA/nurse) to input urgent request.  - If the patient is not currently in the dentist office, please route to the Pre-Op pool.  Request for surgical clearance:  What type of surgery is being performed? 2 dental extractions   When is this surgery scheduled? TBD  What type of clearance is required (medical clearance vs. Pharmacy clearance to hold med vs. Both)? both  Are there any medications that need to be held prior to surgery and how long? Not listed, please advise  Practice name and name of physician performing surgery? [facility]  What is the office phone number? [phone]   7.   What is the office fax number? [phone]  8.   Anesthesia type (None, local, MAC, general) ? Not listed    Ace Gins 01/04/2021, 4:21 PM  _________________________________________________________________   (provider comments below)

## 2021-01-07 ENCOUNTER — Other Ambulatory Visit: Payer: Self-pay | Admitting: Cardiovascular Disease

## 2021-01-18 ENCOUNTER — Inpatient Hospital Stay
Admission: EM | Admit: 2021-01-18 | Discharge: 2021-02-27 | DRG: 208 | Disposition: E | Payer: Medicare Other | Attending: Internal Medicine | Admitting: Internal Medicine

## 2021-01-18 ENCOUNTER — Emergency Department: Payer: Medicare Other

## 2021-01-18 ENCOUNTER — Encounter: Payer: Self-pay | Admitting: Pulmonary Disease

## 2021-01-18 ENCOUNTER — Other Ambulatory Visit: Payer: Self-pay

## 2021-01-18 DIAGNOSIS — M109 Gout, unspecified: Secondary | ICD-10-CM | POA: Diagnosis present

## 2021-01-18 DIAGNOSIS — Z9181 History of falling: Secondary | ICD-10-CM

## 2021-01-18 DIAGNOSIS — J45909 Unspecified asthma, uncomplicated: Secondary | ICD-10-CM | POA: Diagnosis present

## 2021-01-18 DIAGNOSIS — Z515 Encounter for palliative care: Secondary | ICD-10-CM

## 2021-01-18 DIAGNOSIS — I480 Paroxysmal atrial fibrillation: Secondary | ICD-10-CM | POA: Diagnosis present

## 2021-01-18 DIAGNOSIS — R0603 Acute respiratory distress: Secondary | ICD-10-CM | POA: Diagnosis present

## 2021-01-18 DIAGNOSIS — K219 Gastro-esophageal reflux disease without esophagitis: Secondary | ICD-10-CM | POA: Diagnosis present

## 2021-01-18 DIAGNOSIS — G9341 Metabolic encephalopathy: Secondary | ICD-10-CM | POA: Diagnosis present

## 2021-01-18 DIAGNOSIS — Z8261 Family history of arthritis: Secondary | ICD-10-CM

## 2021-01-18 DIAGNOSIS — I080 Rheumatic disorders of both mitral and aortic valves: Secondary | ICD-10-CM | POA: Diagnosis present

## 2021-01-18 DIAGNOSIS — F4310 Post-traumatic stress disorder, unspecified: Secondary | ICD-10-CM | POA: Diagnosis present

## 2021-01-18 DIAGNOSIS — G894 Chronic pain syndrome: Secondary | ICD-10-CM | POA: Diagnosis present

## 2021-01-18 DIAGNOSIS — N1831 Chronic kidney disease, stage 3a: Secondary | ICD-10-CM | POA: Diagnosis present

## 2021-01-18 DIAGNOSIS — J9601 Acute respiratory failure with hypoxia: Secondary | ICD-10-CM

## 2021-01-18 DIAGNOSIS — I129 Hypertensive chronic kidney disease with stage 1 through stage 4 chronic kidney disease, or unspecified chronic kidney disease: Secondary | ICD-10-CM | POA: Diagnosis present

## 2021-01-18 DIAGNOSIS — Z96642 Presence of left artificial hip joint: Secondary | ICD-10-CM | POA: Diagnosis present

## 2021-01-18 DIAGNOSIS — K56600 Partial intestinal obstruction, unspecified as to cause: Secondary | ICD-10-CM | POA: Diagnosis not present

## 2021-01-18 DIAGNOSIS — Z66 Do not resuscitate: Secondary | ICD-10-CM | POA: Diagnosis present

## 2021-01-18 DIAGNOSIS — U071 COVID-19: Secondary | ICD-10-CM | POA: Diagnosis present

## 2021-01-18 DIAGNOSIS — E785 Hyperlipidemia, unspecified: Secondary | ICD-10-CM | POA: Diagnosis present

## 2021-01-18 DIAGNOSIS — Z6834 Body mass index (BMI) 34.0-34.9, adult: Secondary | ICD-10-CM | POA: Diagnosis not present

## 2021-01-18 DIAGNOSIS — Z7401 Bed confinement status: Secondary | ICD-10-CM

## 2021-01-18 DIAGNOSIS — J80 Acute respiratory distress syndrome: Secondary | ICD-10-CM | POA: Diagnosis present

## 2021-01-18 DIAGNOSIS — R630 Anorexia: Secondary | ICD-10-CM | POA: Diagnosis present

## 2021-01-18 DIAGNOSIS — Z7901 Long term (current) use of anticoagulants: Secondary | ICD-10-CM

## 2021-01-18 DIAGNOSIS — J9811 Atelectasis: Secondary | ICD-10-CM | POA: Diagnosis present

## 2021-01-18 DIAGNOSIS — J811 Chronic pulmonary edema: Secondary | ICD-10-CM

## 2021-01-18 DIAGNOSIS — E039 Hypothyroidism, unspecified: Secondary | ICD-10-CM | POA: Diagnosis present

## 2021-01-18 DIAGNOSIS — I495 Sick sinus syndrome: Secondary | ICD-10-CM | POA: Diagnosis present

## 2021-01-18 DIAGNOSIS — I482 Chronic atrial fibrillation, unspecified: Secondary | ICD-10-CM | POA: Diagnosis present

## 2021-01-18 DIAGNOSIS — F410 Panic disorder [episodic paroxysmal anxiety] without agoraphobia: Secondary | ICD-10-CM | POA: Diagnosis present

## 2021-01-18 DIAGNOSIS — G4733 Obstructive sleep apnea (adult) (pediatric): Secondary | ICD-10-CM | POA: Diagnosis present

## 2021-01-18 DIAGNOSIS — J9600 Acute respiratory failure, unspecified whether with hypoxia or hypercapnia: Secondary | ICD-10-CM | POA: Diagnosis present

## 2021-01-18 DIAGNOSIS — J939 Pneumothorax, unspecified: Secondary | ICD-10-CM

## 2021-01-18 DIAGNOSIS — F40240 Claustrophobia: Secondary | ICD-10-CM | POA: Diagnosis present

## 2021-01-18 DIAGNOSIS — Z79899 Other long term (current) drug therapy: Secondary | ICD-10-CM

## 2021-01-18 DIAGNOSIS — Z978 Presence of other specified devices: Secondary | ICD-10-CM

## 2021-01-18 DIAGNOSIS — J1282 Pneumonia due to coronavirus disease 2019: Secondary | ICD-10-CM | POA: Diagnosis present

## 2021-01-18 DIAGNOSIS — Z789 Other specified health status: Secondary | ICD-10-CM

## 2021-01-18 DIAGNOSIS — Z7189 Other specified counseling: Secondary | ICD-10-CM | POA: Diagnosis not present

## 2021-01-18 DIAGNOSIS — R0602 Shortness of breath: Secondary | ICD-10-CM | POA: Diagnosis not present

## 2021-01-18 DIAGNOSIS — Z950 Presence of cardiac pacemaker: Secondary | ICD-10-CM

## 2021-01-18 DIAGNOSIS — N179 Acute kidney failure, unspecified: Secondary | ICD-10-CM | POA: Diagnosis present

## 2021-01-18 DIAGNOSIS — Z01818 Encounter for other preprocedural examination: Secondary | ICD-10-CM

## 2021-01-18 DIAGNOSIS — Z91041 Radiographic dye allergy status: Secondary | ICD-10-CM

## 2021-01-18 DIAGNOSIS — Z809 Family history of malignant neoplasm, unspecified: Secondary | ICD-10-CM

## 2021-01-18 DIAGNOSIS — E669 Obesity, unspecified: Secondary | ICD-10-CM | POA: Diagnosis present

## 2021-01-18 DIAGNOSIS — Z8249 Family history of ischemic heart disease and other diseases of the circulatory system: Secondary | ICD-10-CM

## 2021-01-18 DIAGNOSIS — R140 Abdominal distension (gaseous): Secondary | ICD-10-CM

## 2021-01-18 DIAGNOSIS — Z820 Family history of epilepsy and other diseases of the nervous system: Secondary | ICD-10-CM

## 2021-01-18 DIAGNOSIS — Z79891 Long term (current) use of opiate analgesic: Secondary | ICD-10-CM

## 2021-01-18 DIAGNOSIS — Z823 Family history of stroke: Secondary | ICD-10-CM

## 2021-01-18 LAB — CBC WITH DIFFERENTIAL/PLATELET
Abs Immature Granulocytes: 0.08 10*3/uL — ABNORMAL HIGH (ref 0.00–0.07)
Basophils Absolute: 0 10*3/uL (ref 0.0–0.1)
Basophils Relative: 0 %
Eosinophils Absolute: 0 10*3/uL (ref 0.0–0.5)
Eosinophils Relative: 0 %
HCT: 40.4 % (ref 36.0–46.0)
Hemoglobin: 13.6 g/dL (ref 12.0–15.0)
Immature Granulocytes: 1 %
Lymphocytes Relative: 7 %
Lymphs Abs: 0.5 10*3/uL — ABNORMAL LOW (ref 0.7–4.0)
MCH: 31.9 pg (ref 26.0–34.0)
MCHC: 33.7 g/dL (ref 30.0–36.0)
MCV: 94.6 fL (ref 80.0–100.0)
Monocytes Absolute: 0.3 10*3/uL (ref 0.1–1.0)
Monocytes Relative: 5 %
Neutro Abs: 5.8 10*3/uL (ref 1.7–7.7)
Neutrophils Relative %: 87 %
Platelets: 146 10*3/uL — ABNORMAL LOW (ref 150–400)
RBC: 4.27 MIL/uL (ref 3.87–5.11)
RDW: 14.3 % (ref 11.5–15.5)
WBC: 6.7 10*3/uL (ref 4.0–10.5)
nRBC: 0 % (ref 0.0–0.2)

## 2021-01-18 LAB — COMPREHENSIVE METABOLIC PANEL
ALT: 21 U/L (ref 0–44)
AST: 33 U/L (ref 15–41)
Albumin: 2.5 g/dL — ABNORMAL LOW (ref 3.5–5.0)
Alkaline Phosphatase: 52 U/L (ref 38–126)
Anion gap: 7 (ref 5–15)
BUN: 46 mg/dL — ABNORMAL HIGH (ref 8–23)
CO2: 18 mmol/L — ABNORMAL LOW (ref 22–32)
Calcium: 7.8 mg/dL — ABNORMAL LOW (ref 8.9–10.3)
Chloride: 112 mmol/L — ABNORMAL HIGH (ref 98–111)
Creatinine, Ser: 1.19 mg/dL — ABNORMAL HIGH (ref 0.44–1.00)
GFR, Estimated: 46 mL/min — ABNORMAL LOW (ref >60)
Glucose, Bld: 100 mg/dL — ABNORMAL HIGH (ref 70–99)
Potassium: 4 mmol/L (ref 3.5–5.1)
Sodium: 137 mmol/L (ref 135–145)
Total Bilirubin: 1.2 mg/dL (ref 0.3–1.2)
Total Protein: 6 g/dL — ABNORMAL LOW (ref 6.5–8.1)

## 2021-01-18 LAB — GLUCOSE, CAPILLARY: Glucose-Capillary: 98 mg/dL (ref 70–99)

## 2021-01-18 LAB — BLOOD GAS, ARTERIAL
Acid-base deficit: 4.5 mmol/L — ABNORMAL HIGH (ref 0.0–2.0)
Bicarbonate: 19 mmol/L — ABNORMAL LOW (ref 20.0–28.0)
Delivery systems: POSITIVE
Expiratory PAP: 8
FIO2: 0.5
Inspiratory PAP: 15
O2 Saturation: 95.8 %
Patient temperature: 37
pCO2 arterial: 30 mmHg — ABNORMAL LOW (ref 32.0–48.0)
pH, Arterial: 7.41 (ref 7.350–7.450)
pO2, Arterial: 80 mmHg — ABNORMAL LOW (ref 83.0–108.0)

## 2021-01-18 LAB — BRAIN NATRIURETIC PEPTIDE: B Natriuretic Peptide: 142.9 pg/mL — ABNORMAL HIGH (ref 0.0–100.0)

## 2021-01-18 LAB — RESP PANEL BY RT-PCR (FLU A&B, COVID) ARPGX2
Influenza A by PCR: NEGATIVE
Influenza B by PCR: NEGATIVE
SARS Coronavirus 2 by RT PCR: POSITIVE — AB

## 2021-01-18 LAB — LACTIC ACID, PLASMA: Lactic Acid, Venous: 1.1 mmol/L (ref 0.5–1.9)

## 2021-01-18 LAB — TROPONIN I (HIGH SENSITIVITY): Troponin I (High Sensitivity): 34 ng/L — ABNORMAL HIGH (ref <18)

## 2021-01-18 LAB — PROCALCITONIN: Procalcitonin: 0.13 ng/mL

## 2021-01-18 MED ORDER — SODIUM CHLORIDE 0.9 % IV SOLN
100.0000 mg | Freq: Every day | INTRAVENOUS | Status: AC
  Administered 2021-01-19 – 2021-01-22 (×4): 100 mg via INTRAVENOUS
  Filled 2021-01-18: qty 20
  Filled 2021-01-18 (×3): qty 100

## 2021-01-18 MED ORDER — GUAIFENESIN-DM 100-10 MG/5ML PO SYRP: 10.0000 mL | ORAL_SOLUTION | ORAL | Status: DC | PRN

## 2021-01-18 MED ORDER — SODIUM CHLORIDE 0.9 % IV SOLN
200.0000 mg | Freq: Once | INTRAVENOUS | Status: AC
  Administered 2021-01-18: 200 mg via INTRAVENOUS
  Filled 2021-01-18: qty 200

## 2021-01-18 MED ORDER — POLYETHYLENE GLYCOL 3350 17 G PO PACK
17.0000 g | PACK | Freq: Every day | ORAL | Status: DC | PRN
  Administered 2021-01-20: 17 g via ORAL
  Filled 2021-01-18: qty 1

## 2021-01-18 MED ORDER — ZINC SULFATE 220 (50 ZN) MG PO CAPS
220.0000 mg | ORAL_CAPSULE | Freq: Every day | ORAL | Status: DC
  Administered 2021-01-19 – 2021-01-21 (×3): 220 mg via ORAL
  Filled 2021-01-18 (×3): qty 1

## 2021-01-18 MED ORDER — METHYLPREDNISOLONE SODIUM SUCC 125 MG IJ SOLR
125.0000 mg | Freq: Once | INTRAMUSCULAR | Status: AC
  Administered 2021-01-18: 125 mg via INTRAVENOUS
  Filled 2021-01-18: qty 2

## 2021-01-18 MED ORDER — HEPARIN SODIUM (PORCINE) 5000 UNIT/ML IJ SOLN
5000.0000 [IU] | Freq: Three times a day (TID) | INTRAMUSCULAR | Status: DC
  Administered 2021-01-19 – 2021-01-21 (×8): 5000 [IU] via SUBCUTANEOUS
  Filled 2021-01-18 (×8): qty 1

## 2021-01-18 MED ORDER — SODIUM CHLORIDE 0.9 % IV BOLUS
1000.0000 mL | Freq: Once | INTRAVENOUS | Status: AC
  Administered 2021-01-18: 1000 mL via INTRAVENOUS

## 2021-01-18 MED ORDER — DOCUSATE SODIUM 100 MG PO CAPS
100.0000 mg | ORAL_CAPSULE | Freq: Two times a day (BID) | ORAL | Status: DC | PRN
  Administered 2021-01-20: 100 mg via ORAL
  Filled 2021-01-18: qty 1

## 2021-01-18 MED ORDER — HYDROCOD POLST-CPM POLST ER 10-8 MG/5ML PO SUER: 5.0000 mL | Freq: Two times a day (BID) | ORAL | Status: DC | PRN

## 2021-01-18 MED ORDER — ALBUTEROL SULFATE HFA 108 (90 BASE) MCG/ACT IN AERS
2.0000 | INHALATION_SPRAY | Freq: Four times a day (QID) | RESPIRATORY_TRACT | Status: DC
  Administered 2021-01-20 – 2021-01-21 (×5): 2 via RESPIRATORY_TRACT
  Filled 2021-01-18 (×2): qty 6.7

## 2021-01-18 MED ORDER — ASCORBIC ACID 500 MG PO TABS
500.0000 mg | ORAL_TABLET | Freq: Every day | ORAL | Status: DC
  Administered 2021-01-19 – 2021-01-21 (×3): 500 mg via ORAL
  Filled 2021-01-18 (×3): qty 1

## 2021-01-18 NOTE — Progress Notes (Signed)
Patient arrived via EMS on a 100% NRB. Patient was tachypneic and in respiratory distress. History of COVID within the last two weeks. Placed patient on Bipap 15/8 r-12 50% for SpO2 of 94%. Will continue to monitor.

## 2021-01-18 NOTE — Progress Notes (Signed)
[place] Progress Note Patient Name: [name] DOB: 05-Sep-1940 MRN: [id number]   Date of Service  01/13/2021  HPI/Events of Note  COVID-19 (+); hypoxic  eICU Interventions  Alert; on nasal canula      Intervention Category Evaluation Type: New Patient Evaluation  [place] 01/25/2021, 11:50 PM

## 2021-01-18 NOTE — Consult Note (Signed)
PHARMACY CONSULT NOTE  Pharmacy Consult for Electrolyte Monitoring and Replacement   Recent Labs: Potassium (mmol/L)  Date Value  01/13/2021 4.0   Magnesium (mg/dL)  Date Value  11/19/2018 1.5 (L)   Calcium (mg/dL)  Date Value  01/21/2021 7.8 (L)   Albumin (g/dL)  Date Value  01/17/2021 2.5 (L)  03/07/2017 4.4   Sodium (mmol/L)  Date Value  12/31/2020 137  04/22/2019 140   Assessment: Patient is an 80 y/o F with medical history including hypothyroidism, HLD, obesity, HTN, sick sinus syndrome s/p pacemaker, GERD, Afib on Eliquis, CKD who is admitted with respiratory distress in setting of known COVID-19 infection diagnosed two weeks prior to presentation. Pharmacy consulted to assist with electrolyte monitoring and replacement as indicated.  Patient on BiPAP  Goal of Therapy:  Electrolytes within normal limits  Plan:  --No electrolyte replacement indicated at this time --Follow-up electrolytes with AM labs tomorrow  Benita Gutter 01/24/2021 10:39 PM

## 2021-01-18 NOTE — ED Triage Notes (Signed)
EMS arrived at pt house to find pt in resp distress. Pt had a 02 sat on RA of 54%. EMS placed pt on 15l/min via NRB and pt o2 sats came wnl. Pt was dx with COVID two weeks ago.

## 2021-01-18 NOTE — ED Notes (Signed)
RN called and notified RT that pt is being transferred to the ICU. RN explained to RT that pt can go on NRB at 15l/min, RT agreed. RT sts, they will bring BiPap up to ICU.

## 2021-01-18 NOTE — Consult Note (Signed)
Remdesivir - Pharmacy Brief Note   O:  ALT: 21 CXR: "Patchy airspace opacities primarily in the upper lobes which may be sequelae from the prior COVID infection" SpO2: Hypoxic requiring supplemental oxygen   A/P:  Inpatient COVID screening is pending. Per chart review, patient diagnosed with COVID two weeks prior to presentation  Remdesivir 200 mg IVPB once followed by 100 mg IVPB daily x 4 days.   Benita Gutter  01/23/2021 10:02 PM

## 2021-01-18 NOTE — H&P (Signed)
NAMENiquita Shaw, MRN:  [id number], DOB:  1940/05/25, LOS: 0 ADMISSION DATE:  01/21/2021, CONSULTATION DATE:  01/08/2021 REFERRING MD: Duffy Bruce, MD  CHIEF COMPLAINT:  SOB    HPI  80 y.o with significant PMH of chronic atrial fibrillation on Eliquis, sick sinus syndrome s/p pacemaker, HTN, HLD, CKD OSA not on CPAP, hypothyroidism and bilateral chronic hip pain who presented to the ED with chief complaints of progressive shortness of breath.  Patient developed fevers, chills, generalized weakness, cough, lethargy, and dyspnea several days before presenting to the ED. Patient and her husband took a home COVID test on 01/10/21 which came back positive. Per daughter, her primary medical doctor started her on Paxlovid, Azithromycin and Prednisone x 5 days with no improvement. Patient continued to have progressive dyspnea, refused po intake and meds due to lack of appetite and lethargy. Patient's symptoms worsened today prompting her daughter to call EMS. On EMS arrival, patient was found to be hypoxic with sats in the 50's on R.A. She was placed on 15L/min via NRB and transported to the ED.  ED Course: On arrival to the ED, her temperature was 98.71F, Blood pressure 112/75 mm Hg, heart rate of 116 beats per minute, respiratory rate of 27 per minute, and oxygen saturation of 92% on NRB.  Physical exam was significant for significant dyspnea and bilateral rales on auscultation of the lung fields bilaterally but was unremarkable otherwise. A chest X-ray was performed showing bilateral opacities throughout the lung fields with predominance of the upper lung lobes.  Pertinent Labs in Red/Diagnostics Findings: Na+/ K+: 137/4.0 Glucose: 100 BUN/Cr.: 46/1.19 Calcium: 7.8 AST/ALT: 33/21   WBC/ TMAX: 6.7/ afebrile Hgb/Hct: 13.6/40.4 Plts: 146 PCT: 0.13 Lactic acid: 1.1 COVID PCR: Positive   Troponin: 34 BNP:142.9 Arterial Blood Gas result:  pO2 80; pCO2 30; pH 7.41;  HCO3 19.0, %O2 Sat  95.8 EKG: Atrial fibrillation, VR 104. QRS 108, QTc 481. RSR'. No acute St elevations or depressions. No ischemia or infarct  Patient was placed on BiPAP due to significant dyspnea with isolation precautions for COVID-19. She was started on Remdesivir and steroids. PCCM consulted for admission and further management.  Past Medical History   DDD (degenerative disc disease), lumbosacral 10/27/2020  High serum vitamin B12 12/16/2018  CKD stage G3a/A1, GFR 45-59 and albumin  12/16/2018  Hypomagnesemia 12/16/2018  Sleep Apnea not on CPAP   Disorder of skeletal system 11/18/2018  Problems influencing health status 11/18/2018  Chronic hip pain (Bilateral) 11/18/2018  Chronic hip pain s/p THR  (Left) 11/18/2018  Neurogenic pain 11/18/2018  Chronic anticoagulation (Eliquis) 11/18/2018  S/P revision of total hip 03/01/2018  Hematuria 10/16/2017  Disorder of bone, unspecified 03/07/2017  Other specified health status 03/07/2017  CKD (chronic kidney disease) stage 3, GFR 30-59 ml/min (HCC) 05/11/2016  Anemia of chronic disease 05/11/2016  Hypocalcemia 05/11/2016  Chronic pain syndrome 02/14/2016  S/P total hip replacement (Left) 02/06/2016  S/P total hip arthroplasty 12/22/2015  Hypothyroidism (acquired) 12/01/2015  Long term (current) use of opiate analgesic 12/01/2015  Morbid obesity with BMI of 40.0-44.9, adult ([place]) 12/01/2015  PTSD (post-traumatic stress disorder) 12/01/2015  Spinal stenosis of lumbar region 12/01/2015  Opioid-induced constipation (OIC) 09/30/2015  Avascular necrosis of left femoral head ([place]) 08/17/2015  Chronic hip pain (1ry area of Pain) (Left) 08/11/2015  Osteoarthritis of hip (Left) 08/11/2015  Trochanteric bursitis of hip (Left) 08/11/2015  Lumbar central spinal stenosis (L4-5) 08/11/2015  Lumbar facet arthropathy ([place]) 08/11/2015  Osteoarthritis of sacroiliac joint (with vacuum phenomena  in) (Bilateral) 08/11/2015  Atrial fibrillation ([place]) 02/16/2015  GERD  (gastroesophageal reflux disease) 10/27/2010  Hypothyroidism 06/04/2008  HLD 06/04/2008  Essential hypertension 06/04/2008  Sick sinus syndrome ([place]) s/p Pacemaker 06/04/2008    Significant Hospital Events   11/22: Admitted to ICU with acute hypoxic respiratory failure secondary to COVID-pneumonia  Consults:  PCCM  Procedures:  None  Significant Diagnostic Tests:  11/22: Chest Xray>Patchy airspace opacities primarily in the upper lobes which may be sequelae from the prior COVID infection  Micro Data:  11/22: SARS-CoV-2 PCR> POSITIVE 11/22: Influenza PCR> negative 11/22: Blood culture x2> 11/22: Urine Culture> 11/22: MRSA PCR>>  11/22: Strep pneumo urinary antigen> 11/22: Legionella urinary antigen>  Antimicrobials:  Azithromycin 11/22> Ceftriaxone 11/22>  OBJECTIVE  Blood pressure (!) 109/58, pulse 98, temperature 98.7 F (37.1 C), resp. rate (!) 34, weight 81.6 kg, SpO2 91 %.    FiO2 (%):  [50 %] 50 %  No intake or output data in the 24 hours ending 01/20/2021 2319 Filed Weights   01/24/2021 1926  Weight: 81.6 kg   Physical Examination  GENERAL: 80 year-old critically ill patient lying in the bed on HHFNC EYES: Pupils equal, round, reactive to light and accommodation. No scleral icterus. Extraocular muscles intact.  HEENT: Head atraumatic, normocephalic. Oropharynx and nasopharynx clear.  NECK:  Supple, no jugular venous distention. No thyroid enlargement, no tenderness.  LUNGS: Decreased breath sounds bilaterally, no wheezing, Bilateral rales and rhonchi. No use of accessory muscles of respiration.  CARDIOVASCULAR: S1, S2 normal. No murmurs, rubs, or gallops.  ABDOMEN: Soft, nontender, nondistended. Bowel sounds present. No organomegaly or mass.  EXTREMITIES: Non pitting edema of lower extremities, cyanosis, or clubbing.  NEUROLOGIC: Cranial nerves II through XII are intact.  Muscle strength 5/5 in all extremities. Sensation intact. Gait not checked.  PSYCHIATRIC:  The patient is alert and oriented x 3.  SKIN: No obvious rash, lesion, or ulcer. Left leg bruising  Labs/imaging that I havepersonally reviewed  (right click and "Reselect all SmartList Selections" daily)     Labs   CBC: Recent Labs  Lab 01/07/2021 1926  WBC 6.7  NEUTROABS 5.8  HGB 13.6  HCT 40.4  MCV 94.6  PLT 146*    Basic Metabolic Panel: Recent Labs  Lab 01/05/2021 1926  NA 137  K 4.0  CL 112*  CO2 18*  GLUCOSE 100*  BUN 46*  CREATININE 1.19*  CALCIUM 7.8*   GFR: Estimated Creatinine Clearance: 35.9 mL/min (A) (by C-G formula based on SCr of 1.19 mg/dL (H)). Recent Labs  Lab 12/29/2020 1926  PROCALCITON 0.13  WBC 6.7  LATICACIDVEN 1.1    Liver Function Tests: Recent Labs  Lab 01/14/2021 1926  AST 33  ALT 21  ALKPHOS 52  BILITOT 1.2  PROT 6.0*  ALBUMIN 2.5*   No results for input(s): LIPASE, AMYLASE in the last 168 hours. No results for input(s): AMMONIA in the last 168 hours.  ABG    Component Value Date/Time   PHART 7.41 01/13/2021 2055   PCO2ART 30 (L) 01/24/2021 2055   PO2ART 80 (L) 01/24/2021 2055   HCO3 19.0 (L) 01/09/2021 2055   ACIDBASEDEF 4.5 (H) 12/31/2020 2055   O2SAT 95.8 01/19/2021 2055     Coagulation Profile: No results for input(s): INR, PROTIME in the last 168 hours.  Cardiac Enzymes: No results for input(s): CKTOTAL, CKMB, CKMBINDEX, TROPONINI in the last 168 hours.  HbA1C: No results found for: HGBA1C  CBG: No results for input(s): GLUCAP in the last 168 hours.  Review of Systems:   Review of Systems  Constitutional:  Positive for chills, fever and malaise/fatigue.  HENT:  Positive for congestion.   Eyes: Negative.   Respiratory:  Positive for cough and shortness of breath.   Cardiovascular:  Positive for leg swelling.  Gastrointestinal:  Negative for abdominal pain, blood in stool, constipation, diarrhea, heartburn, melena, nausea and vomiting.  Genitourinary: Negative.   Musculoskeletal:  Positive for back pain,  falls, joint pain, myalgias and neck pain.  Skin: Negative.   Neurological: Negative.   Endo/Heme/Allergies: Negative.   Psychiatric/Behavioral: Negative.      Past Medical History  She,  has a past medical history of Anxiety, Aortic insufficiency, Arthritis, Asthma, Bronchitis, Bursitis, hip, Claustrophobia, Claustrophobia, Dysrhythmia, GERD (gastroesophageal reflux disease), Gout, Hyperlipidemia, Hypertension, Hypothyroidism, Mitral regurgitation, Obesity, Panic attacks, Paroxysmal atrial fibrillation ([place]), PTSD (post-traumatic stress disorder), Rhinitis, Sick sinus syndrome ([place]), and Sleep apnea.   Surgical History    Past Surgical History:  Procedure Laterality Date   CARDIAC CATHETERIZATION     DILATATION & CURETTAGE/HYSTEROSCOPY WITH MYOSURE N/A 08/03/2015   Procedure: DILATATION & CURETTAGE/HYSTEROSCOPY WITH MYOSURE;  Surgeon: Alden Hipp, MD;  Location: [facility];  Service: Gynecology;  Laterality: N/A;  Patient has an ICD    INSERT / REPLACE / REMOVE PACEMAKER     PACEMAKER INSERTION     Status post insertion   PPM GENERATOR CHANGEOUT N/A 05/02/2019   Procedure: PPM GENERATOR CHANGEOUT;  Surgeon: Deboraha Sprang, MD;  Location: [facility];  Service: Cardiovascular;  Laterality: N/A;   ROTATOR CUFF REPAIR     left   Status post child birth x1     TONSILLECTOMY     TOTAL HIP ARTHROPLASTY Left 12/22/2015   Procedure: TOTAL HIP ARTHROPLASTY;  Surgeon: Dereck Leep, MD;  Location: [facility];  Service: Orthopedics;  Laterality: Left;   TOTAL HIP REVISION Left 03/01/2018   Procedure: TOTAL HIP REVISION;  Surgeon: Dereck Leep, MD;  Location: [facility];  Service: Orthopedics;  Laterality: Left;     Social History   reports that she has never smoked. She has never used smokeless tobacco. She reports that she does not drink alcohol and does not use drugs.   Family History   Her family history includes Alzheimer's disease in her father; Arthritis in her sister; Cancer in her  brother and maternal grandfather; Heart disease in her mother; Hypertension in her sister; Stroke in her mother.   Allergies Allergies  Allergen Reactions   Contrast Media [Iodinated Diagnostic Agents] Rash    Contrast dye causes swelling     Home Medications  Prior to Admission medications   Medication Sig Start Date End Date Taking? Authorizing Provider  acetaminophen (TYLENOL) 500 MG tablet Take 1,000 mg by mouth every 6 (six) hours as needed for headache.    [provider]  allopurinol (ZYLOPRIM) 100 MG tablet Take 100 mg by mouth 2 (two) times daily.  10/15/17   [provider]  apixaban (ELIQUIS) 5 MG TABS tablet Take 1 tablet (5 mg total) by mouth 2 (two) times daily. 08/31/20   Deboraha Sprang, MD  cyclobenzaprine (FLEXERIL) 5 MG tablet Take 1 tablet (5 mg total) by mouth 2 (two) times daily as needed for muscle spasms. 02/03/20 10/26/20  Milinda Pointer, MD  liothyronine (CYTOMEL) 5 MCG tablet Take 15 mcg by mouth daily.     [provider]  lubiprostone (AMITIZA) 8 MCG capsule Take 1 capsule (8 mcg total) by mouth 2 (two)  times daily with a meal. Swallow whole, do not break or chew. 03/31/20 10/26/20  Milinda Pointer, MD  metoprolol tartrate (LOPRESSOR) 50 MG tablet TAKE ONE AND ONE-HALF TABLETS TWICE A DAY 01/07/21   Wellington Hampshire, MD  oxyCODONE (OXY IR/ROXICODONE) 5 MG immediate release tablet Take 1 tablet (5 mg total) by mouth every 6 (six) hours as needed for severe pain. Must last 30 days 11/30/20 12/30/20  Milinda Pointer, MD  oxyCODONE (OXY IR/ROXICODONE) 5 MG immediate release tablet Take 1 tablet (5 mg total) by mouth every 6 (six) hours as needed for severe pain. Must last 30 days 12/30/20 01/29/21  Milinda Pointer, MD  oxyCODONE (OXY IR/ROXICODONE) 5 MG immediate release tablet Take 1 tablet (5 mg total) by mouth every 6 (six) hours as needed for severe pain. Must last 30 days 01/29/21 02/28/21  Milinda Pointer, MD  PLAQUENIL 200 MG tablet  Take 200 mg by mouth 2 (two) times daily. 07/19/20   [provider]  pregabalin (LYRICA) 25 MG capsule Take 1 capsule (25 mg total) by mouth 2 (two) times daily. 02/03/20 10/26/20  Milinda Pointer, MD  spironolactone (ALDACTONE) 100 MG tablet Take 100 mg by mouth daily.    [provider]  thyroid (ARMOUR) 60 MG tablet Take 60 mg by mouth daily before breakfast.     [provider]  Scheduled Meds:  albuterol  2 puff Inhalation Q6H   vitamin C  500 mg Oral Daily   heparin  5,000 Units Subcutaneous Q8H   zinc sulfate  220 mg Oral Daily   Continuous Infusions:  remdesivir 100 mg in NS 100 mL     PRN Meds:.chlorpheniramine-HYDROcodone, docusate sodium, guaiFENesin-dextromethorphan, polyethylene glycol  Assessment & Plan:  Acute Hypoxic Respiratory Failure secondary to COVID-19 Pneumonia  Tested + 11/14 treated with Paxlovid, Prednisone and Azithromycin x 5 days -Place on HHFNC, wean FiO2 as tolerated -BiPAP, wean as tolerated -Supplemental O2 as needed to maintain O2 saturations 88 to 92% -Follow intermittent ABG and chest x-ray as needed -Ensure adequate pulmonary hygiene  -F/u cultures, trend PCT, wbc, monitor fever curve -As needed bronchodilators -Start Empiric abx coverage given risk factors (Asthma, Bronchitis ) with Ceftriaxone + Azithromycin pending cultures, consider de-escalation if appropriate -Start Remdesivir x5 days  -Continue steroids. -Encourage OOB, IS, FV, and awake proning if able -Continue airborne, contact precautions for 21 days from positive testing. -Daily CRP, CMP, CBC, D-dimer -Heparin prophylactic dose.   AKI on CKD Stage III BUN/Cr.: 46/1.19 -Monitor I&O's / urinary output -Follow BMP -Ensure adequate renal perfusion -Avoid nephrotoxic agents as able -Replace electrolytes as indicated  Chronic Atrial Fibrlilation Hx: SSS s/p pacemaker Thromboembolic risk factors ( age  -2, HTN-1, Gender-1) for a CHADSVASc Score of  >=4 -Hold Eliquis until able to take po -Heparin for now -Metroprolol for rate control  Hypertension HLD  -Continuous cardiac monitoring -Maintain MAP greater than 65 -Continue metoprolol as BP permits -Hold Spironolactone for now -Repeat 2D Echocardiogram   Chronic Pain Syndrome -Continue home meds oxy, Pregabalin once able to take po  Hypothyroidism -Check TSH -Continue Armour   Best practice:  Diet:  NPO Pain/Anxiety/Delirium protocol (if indicated): No VAP protocol (if indicated): Not indicated DVT prophylaxis: Subcutaneous Heparin GI prophylaxis: H2B Glucose control:  SSI No Central venous access:  N/A Arterial line:  N/A Foley:  N/A Mobility:  bed rest  PT consulted: N/A Last date of multidisciplinary goals of care discussion [11/22] Code Status:  limited Disposition: ICU   = Goals of  Care = Code Status Order: LIMITED CODE  Primary Emergency Contact: [place], Home Phone: [phone] Discussed code status with patient's Husband and Duaghter Tammy. They confirm that patient wishes to pursue full aggressive treatment and intervention options, including CPR/ACLS Drugs and Defibrillation EXCEPT intubation, goals of care will be addressed on going with family if that should become necessary.  Family would like to be immediately contacted in such situations.  Critical care time: 45 minutes     Rufina Falco, DNP, CCRN, FNP-C, AGACNP-BC Acute Care Nurse Practitioner  [facility] Pager: [phone] [place] [facility]  .

## 2021-01-18 NOTE — ED Provider Notes (Signed)
[facility] Emergency Department Provider Note  ____________________________________________   Event Date/Time   First MD Initiated Contact with Patient 01/04/2021 1926     (approximate)  I have reviewed the triage vital signs and the nursing notes.   HISTORY  Chief Complaint Respiratory Distress    HPI [name] is a 80 y.o. female  with PMHx HTN, HLD, pAFib, here with hypoxia, resp dsitress. Pt arrives via EMS. Per report, she has had COVID x 2 weeks. Reports she initially felt al ittle better but has worsened again over the past 2-3 days. Reports she has had cough, SOB, sputum production. Dyspnea has become severe today. She has been weak, lightheaded, unable to walk on her own. Called EMS and EMS found her markedly hypoxic. Reports she has had poor appetite. Denies fevers. No chills. Has reportedly been taking prednisone and ivermectin from PCP.        Past Medical History:  Diagnosis Date   Anxiety    Aortic insufficiency    Trivial   Arthritis    Asthma    Bronchitis    Bursitis, hip    Claustrophobia    Claustrophobia    Dysrhythmia    a fib   GERD (gastroesophageal reflux disease)    history of   Gout    Hyperlipidemia    Hypertension    Hypothyroidism    Mitral regurgitation    Obesity    Panic attacks    Paroxysmal atrial fibrillation (HCC)    PTSD (post-traumatic stress disorder)    Rhinitis    Sick sinus syndrome (HCC)    Sleep apnea    was ordered CPAP, but never fitted for one years ago, Dr has retired    Patient Active Problem List   Diagnosis Date Noted   Acute respiratory failure due to COVID-19 ([place]) 01/09/2021   DDD (degenerative disc disease), lumbosacral 10/27/2020   Chronic use of opiate for therapeutic purpose 08/01/2020   Chronic musculoskeletal pain 12/16/2018   Muscle spasms of lower extremity (left hip) 12/16/2018   High serum vitamin B12 12/16/2018   CKD stage G3a/A1, GFR 45-59 and albumin creatinine  ratio <30 mg/g ([place]) 12/16/2018   Hypomagnesemia 12/16/2018   Pharmacologic therapy 11/18/2018   Disorder of skeletal system 11/18/2018   Problems influencing health status 11/18/2018   Chronic hip pain (Bilateral) 11/18/2018   Chronic hip pain s/p THR  (Left) 11/18/2018   Neurogenic pain 11/18/2018   Chronic anticoagulation (Eliquis) 11/18/2018   S/P revision of total hip 03/01/2018   Hematuria 10/16/2017   Disorder of bone, unspecified 03/07/2017   Other specified health status 03/07/2017   CKD (chronic kidney disease) stage 3, GFR 30-59 ml/min (HCC) 05/11/2016   Anemia of chronic disease 05/11/2016   Hypocalcemia 05/11/2016   Chronic pain syndrome 02/14/2016   S/P total hip replacement (Left) 02/06/2016   S/P total hip arthroplasty 12/22/2015   Hypothyroidism (acquired) 12/01/2015   Long term (current) use of opiate analgesic 12/01/2015   Morbid obesity with BMI of 40.0-44.9, adult ([place]) 12/01/2015   PTSD (post-traumatic stress disorder) 12/01/2015   Spinal stenosis of lumbar region 12/01/2015   Opioid-induced constipation (OIC) 09/30/2015   Avascular necrosis of left femoral head ([place]) 08/17/2015   Chronic hip pain (1ry area of Pain) (Left) 08/11/2015   Long term current use of opiate analgesic 08/11/2015   Long term prescription opiate use 08/11/2015   Opiate use (54 MME/Day) 08/11/2015   Encounter for therapeutic drug level monitoring 08/11/2015  Disturbance of skin sensation 08/11/2015   Osteoarthritis of hip (Left) 08/11/2015   Trochanteric bursitis of hip (Left) 08/11/2015   Lumbar central spinal stenosis (L4-5) 08/11/2015   Lumbar facet arthropathy ([place]) 08/11/2015   Osteoarthritis of sacroiliac joint (with vacuum phenomena in) (Bilateral) 08/11/2015   Atrial fibrillation ([place]) 02/16/2015   GERD (gastroesophageal reflux disease) 10/27/2010   Cough    Cardiac pacemaker in situ 06/05/2008   HYPOTHYROIDISM 06/04/2008   HYPERLIPIDEMIA 06/04/2008   OBESITY 06/04/2008    Essential hypertension 06/04/2008   SICK SINUS SYNDROME 06/04/2008   Sick sinus syndrome ([place]) 06/04/2008    Past Surgical History:  Procedure Laterality Date   CARDIAC CATHETERIZATION     DILATATION & CURETTAGE/HYSTEROSCOPY WITH MYOSURE N/A 08/03/2015   Procedure: DILATATION & CURETTAGE/HYSTEROSCOPY WITH MYOSURE;  Surgeon: Alden Hipp, MD;  Location: [facility];  Service: Gynecology;  Laterality: N/A;  Patient has an ICD    INSERT / REPLACE / REMOVE PACEMAKER     PACEMAKER INSERTION     Status post insertion   PPM GENERATOR CHANGEOUT N/A 05/02/2019   Procedure: PPM GENERATOR CHANGEOUT;  Surgeon: Deboraha Sprang, MD;  Location: [facility];  Service: Cardiovascular;  Laterality: N/A;   ROTATOR CUFF REPAIR     left   Status post child birth x1     TONSILLECTOMY     TOTAL HIP ARTHROPLASTY Left 12/22/2015   Procedure: TOTAL HIP ARTHROPLASTY;  Surgeon: Dereck Leep, MD;  Location: [facility];  Service: Orthopedics;  Laterality: Left;   TOTAL HIP REVISION Left 03/01/2018   Procedure: TOTAL HIP REVISION;  Surgeon: Dereck Leep, MD;  Location: [facility];  Service: Orthopedics;  Laterality: Left;    Prior to Admission medications   Medication Sig Start Date End Date Taking? Authorizing Provider  acetaminophen (TYLENOL) 500 MG tablet Take 1,000 mg by mouth every 6 (six) hours as needed for headache.    [provider]  allopurinol (ZYLOPRIM) 100 MG tablet Take 100 mg by mouth 2 (two) times daily.  10/15/17   [provider]  apixaban (ELIQUIS) 5 MG TABS tablet Take 1 tablet (5 mg total) by mouth 2 (two) times daily. 08/31/20   Deboraha Sprang, MD  cyclobenzaprine (FLEXERIL) 5 MG tablet Take 1 tablet (5 mg total) by mouth 2 (two) times daily as needed for muscle spasms. 02/03/20 10/26/20  Milinda Pointer, MD  liothyronine (CYTOMEL) 5 MCG tablet Take 15 mcg by mouth daily.     [provider]  lubiprostone (AMITIZA) 8 MCG capsule Take 1 capsule (8 mcg total) by  mouth 2 (two) times daily with a meal. Swallow whole, do not break or chew. 03/31/20 10/26/20  Milinda Pointer, MD  metoprolol tartrate (LOPRESSOR) 50 MG tablet TAKE ONE AND ONE-HALF TABLETS TWICE A DAY 01/07/21   Wellington [place], MD  oxyCODONE (OXY IR/ROXICODONE) 5 MG immediate release tablet Take 1 tablet (5 mg total) by mouth every 6 (six) hours as needed for severe pain. Must last 30 days 11/30/20 12/30/20  Milinda Pointer, MD  oxyCODONE (OXY IR/ROXICODONE) 5 MG immediate release tablet Take 1 tablet (5 mg total) by mouth every 6 (six) hours as needed for severe pain. Must last 30 days 12/30/20 01/29/21  Milinda Pointer, MD  oxyCODONE (OXY IR/ROXICODONE) 5 MG immediate release tablet Take 1 tablet (5 mg total) by mouth every 6 (six) hours as needed for severe pain. Must last 30 days 01/29/21 02/28/21  Milinda Pointer, MD  PLAQUENIL 200 MG tablet Take  200 mg by mouth 2 (two) times daily. 07/19/20   [provider]  pregabalin (LYRICA) 25 MG capsule Take 1 capsule (25 mg total) by mouth 2 (two) times daily. 02/03/20 10/26/20  Milinda Pointer, MD  spironolactone (ALDACTONE) 100 MG tablet Take 100 mg by mouth daily.    [provider]  thyroid (ARMOUR) 60 MG tablet Take 60 mg by mouth daily before breakfast.     [provider]    Allergies Contrast media [iodinated diagnostic agents]  Family History  Problem Relation Age of Onset   Stroke Mother    Heart disease Mother    Alzheimer's disease Father    Hypertension Sister    Cancer Brother    Arthritis Sister    Cancer Maternal Grandfather     Social History Social History   Tobacco Use   Smoking status: Never   Smokeless tobacco: Never  Vaping Use   Vaping Use: Never used  Substance Use Topics   Alcohol use: No    Alcohol/week: 0.0 standard drinks   Drug use: No    Review of Systems  Review of Systems  Constitutional:  Positive for chills, fatigue and fever.  HENT:  Negative for congestion and  sore throat.   Eyes:  Negative for visual disturbance.  Respiratory:  Positive for cough and shortness of breath.   Cardiovascular:  Negative for chest pain.  Gastrointestinal:  Positive for nausea. Negative for abdominal pain, diarrhea and vomiting.  Genitourinary:  Negative for flank pain.  Musculoskeletal:  Negative for back pain and neck pain.  Skin:  Negative for rash and wound.  Neurological:  Positive for weakness.  All other systems reviewed and are negative.   ____________________________________________  PHYSICAL EXAM:      VITAL SIGNS: ED Triage Vitals  Enc Vitals Group     BP 01/07/2021 1925 112/75     Pulse Rate 01/17/2021 1919 (!) 119     Resp 01/13/2021 1919 (!) 22     Temp 01/13/2021 1925 98.7 F (37.1 C)     Temp src --      SpO2 01/05/2021 1919 91 %     Weight 01/16/2021 1926 180 lb (81.6 kg)     Height --      Head Circumference --      Peak Flow --      Pain Score --      Pain Loc --      Pain Edu? --      Excl. in [place]? --      Physical Exam Vitals and nursing note reviewed.  Constitutional:      General: She is not in acute distress.    Appearance: She is well-developed. She is ill-appearing.  HENT:     Head: Normocephalic and atraumatic.  Eyes:     Conjunctiva/sclera: Conjunctivae normal.  Cardiovascular:     Rate and Rhythm: Regular rhythm. Tachycardia present.     Heart sounds: Normal heart sounds. No murmur heard.   No friction rub.  Pulmonary:     Effort: Tachypnea and respiratory distress present.     Breath sounds: Decreased air movement present. Examination of the right-upper field reveals rales. Examination of the left-upper field reveals rales. Examination of the right-middle field reveals rales. Examination of the left-middle field reveals rales. Examination of the right-lower field reveals rales. Examination of the left-lower field reveals rales. Decreased breath sounds and rales present. No wheezing.  Abdominal:     General: There is no  distension.     Palpations: Abdomen is soft.     Tenderness: There is no abdominal tenderness.  Musculoskeletal:     Cervical back: Neck supple.  Skin:    General: Skin is warm.     Capillary Refill: Capillary refill takes less than 2 seconds.  Neurological:     Mental Status: She is alert.     Motor: No abnormal muscle tone.      ____________________________________________   LABS (all labs ordered are listed, but only abnormal results are displayed)  Labs Reviewed  RESP PANEL BY RT-PCR (FLU A&B, COVID) ARPGX2 - Abnormal; Notable for the following components:      Result Value   SARS Coronavirus 2 by RT PCR POSITIVE (*)    All other components within normal limits  CBC WITH DIFFERENTIAL/PLATELET - Abnormal; Notable for the following components:   Platelets 146 (*)    Lymphs Abs 0.5 (*)    Abs Immature Granulocytes 0.08 (*)    All other components within normal limits  COMPREHENSIVE METABOLIC PANEL - Abnormal; Notable for the following components:   Chloride 112 (*)    CO2 18 (*)    Glucose, Bld 100 (*)    BUN 46 (*)    Creatinine, Ser 1.19 (*)    Calcium 7.8 (*)    Total Protein 6.0 (*)    Albumin 2.5 (*)    GFR, Estimated 46 (*)    All other components within normal limits  BRAIN NATRIURETIC PEPTIDE - Abnormal; Notable for the following components:   B Natriuretic Peptide 142.9 (*)    All other components within normal limits  BLOOD GAS, ARTERIAL - Abnormal; Notable for the following components:   pCO2 arterial 30 (*)    pO2, Arterial 80 (*)    Bicarbonate 19.0 (*)    Acid-base deficit 4.5 (*)    All other components within normal limits  TROPONIN I (HIGH SENSITIVITY) - Abnormal; Notable for the following components:   Troponin I (High Sensitivity) 34 (*)    All other components within normal limits  CULTURE, BLOOD (SINGLE)  CULTURE, BLOOD (ROUTINE X 2)  CULTURE, BLOOD (ROUTINE X 2)  URINE CULTURE  MRSA NEXT GEN BY PCR, NASAL  LACTIC ACID, PLASMA   PROCALCITONIN  LACTIC ACID, PLASMA  D-DIMER, QUANTITATIVE (NOT AT [facility])  CBC  LACTIC ACID, PLASMA  LACTIC ACID, PLASMA  STREP PNEUMONIAE URINARY ANTIGEN  LEGIONELLA PNEUMOPHILA SEROGP 1 UR AG  URINALYSIS, COMPLETE (UACMP) WITH MICROSCOPIC  APTT  PROTIME-INR  C-REACTIVE PROTEIN  FERRITIN  FIBRINOGEN  LACTATE DEHYDROGENASE  CBC WITH DIFFERENTIAL/PLATELET  COMPREHENSIVE METABOLIC PANEL  D-DIMER, QUANTITATIVE  C-REACTIVE PROTEIN  FERRITIN  MAGNESIUM  PHOSPHORUS  TYPE AND SCREEN  TROPONIN I (HIGH SENSITIVITY)    ____________________________________________  EKG: Atrial fibrillation, VR 104. QRS 108, QTc 481. RSR'. No acute St elevations or depressions. No ischemia or infarct. ________________________________________  RADIOLOGY All imaging, including plain films, CT scans, and ultrasounds, independently reviewed by me, and interpretations confirmed via formal radiology reads.  ED MD interpretation:   CXR: Patchy b/l opacities c/w COVID-19  Official radiology report(s): DG Chest Portable 1 View  Result Date: 01/05/2021 CLINICAL DATA:  Shortness of breath EXAM: PORTABLE CHEST 1 VIEW COMPARISON:  05/01/2014 FINDINGS: Cardiac shadow is stable. Pacing device is noted. Aortic calcifications are again seen. Patchy airspace disease is noted bilaterally predominately in the upper lobes which may be related to the recent COVID diagnosis. No sizable effusion is seen. No bony abnormality is noted.  IMPRESSION: Patchy airspace opacities primarily in the upper lobes which may be sequelae from the prior COVID infection. Electronically Signed   By: Inez Catalina M.D.   On: 01/26/2021 19:51    ____________________________________________  PROCEDURES   Procedure(s) performed (including Critical Care):  .Critical Care Performed by: Duffy Bruce, MD Authorized by: Duffy Bruce, MD   Critical care provider statement:    Critical care time (minutes):  30   Critical care was  necessary to treat or prevent imminent or life-threatening deterioration of the following conditions:  Circulatory failure, cardiac failure and respiratory failure   Critical care was time spent personally by me on the following activities:  Development of treatment plan with patient or surrogate, discussions with consultants, evaluation of patient's response to treatment, examination of patient, ordering and review of laboratory studies, ordering and review of radiographic studies, ordering and performing treatments and interventions, pulse oximetry, re-evaluation of patient's condition and review of old charts   I assumed direction of critical care for this patient from another provider in my specialty: no     Care discussed with: admitting provider    ____________________________________________  INITIAL IMPRESSION / MDM / [place] / ED COURSE  As part of my medical decision making, I reviewed the following data within the [place] notes reviewed and incorporated, Old chart reviewed, Notes from prior ED visits, and [place] Controlled Substance Database       *[name] was evaluated in Emergency Department on 12/29/2020 for the symptoms described in the history of present illness. She was evaluated in the context of the global COVID-19 pandemic, which necessitated consideration that the patient might be at risk for infection with the SARS-CoV-2 virus that causes COVID-19. Institutional protocols and algorithms that pertain to the evaluation of patients at risk for COVID-19 are in a state of rapid change based on information released by regulatory bodies including the CDC and federal and state organizations. These policies and algorithms were followed during the patient's care in the ED.  Some ED evaluations and interventions may be delayed as a result of limited staffing during the pandemic.*     Medical Decision Making:  80 yo female here with respiratory  distress.  Patient arrives significantly dyspneic requiring BiPAP.  She is COVID-positive.  Suspect acute hypoxia secondary to COVID-19.  She is on day 12 of illness consistent with second spike of severe symptoms.  Patient continued on BiPAP.  Lab work shows normal CBC.  CMP largely unremarkable, though should may be mildly dehydrated given BUN/creatinine ratio.  Procalcitonin is 0.13.  Lactic acid normal.  Blood culture sent.  Chest x-ray is consistent with COVID-19.  Patient given steroids, will place on remdesivir, and admit to the ICU.  She is not vaccinated.  Patient updated and is in agreement.  ____________________________________________  FINAL CLINICAL IMPRESSION(S) / ED DIAGNOSES  Final diagnoses:  COVID-19  Acute respiratory failure with hypoxemia ([place])     MEDICATIONS GIVEN DURING THIS VISIT:  Medications  remdesivir 200 mg in sodium chloride 0.9% 250 mL IVPB (has no administration in time range)    Followed by  remdesivir 100 mg in sodium chloride 0.9 % 100 mL IVPB (has no administration in time range)  docusate sodium (COLACE) capsule 100 mg (has no administration in time range)  polyethylene glycol (MIRALAX / GLYCOLAX) packet 17 g (has no administration in time range)  heparin injection 5,000 Units (has no administration in time range)  ascorbic acid (VITAMIN C)  tablet 500 mg (has no administration in time range)  zinc sulfate capsule 220 mg (has no administration in time range)  albuterol (VENTOLIN HFA) 108 (90 Base) MCG/ACT inhaler 2 puff (has no administration in time range)  guaiFENesin-dextromethorphan (ROBITUSSIN DM) 100-10 MG/5ML syrup 10 mL (has no administration in time range)  chlorpheniramine-HYDROcodone (TUSSIONEX) 10-8 MG/5ML suspension 5 mL (has no administration in time range)  methylPREDNISolone sodium succinate (SOLU-MEDROL) 125 mg/2 mL injection 125 mg (125 mg Intravenous Given 01/19/2021 1949)  sodium chloride 0.9 % bolus 1,000 mL (0 mLs Intravenous Stopped  01/05/2021 2222)     ED Discharge Orders     None        Note:  This document was prepared using Dragon voice recognition software and may include unintentional dictation errors.   Duffy Bruce, MD 12/30/2020 [phone]

## 2021-01-19 ENCOUNTER — Inpatient Hospital Stay: Payer: Medicare Other

## 2021-01-19 ENCOUNTER — Encounter: Payer: Self-pay | Admitting: Pulmonary Disease

## 2021-01-19 LAB — CBC
HCT: 40.3 % (ref 36.0–46.0)
Hemoglobin: 13.6 g/dL (ref 12.0–15.0)
MCH: 31.7 pg (ref 26.0–34.0)
MCHC: 33.7 g/dL (ref 30.0–36.0)
MCV: 93.9 fL (ref 80.0–100.0)
Platelets: 144 10*3/uL — ABNORMAL LOW (ref 150–400)
RBC: 4.29 MIL/uL (ref 3.87–5.11)
RDW: 14.3 % (ref 11.5–15.5)
WBC: 6.7 10*3/uL (ref 4.0–10.5)
nRBC: 0 % (ref 0.0–0.2)

## 2021-01-19 LAB — CBC WITH DIFFERENTIAL/PLATELET
Abs Immature Granulocytes: 0.04 10*3/uL (ref 0.00–0.07)
Basophils Absolute: 0 10*3/uL (ref 0.0–0.1)
Basophils Relative: 0 %
Eosinophils Absolute: 0 10*3/uL (ref 0.0–0.5)
Eosinophils Relative: 0 %
HCT: 39 % (ref 36.0–46.0)
Hemoglobin: 13.2 g/dL (ref 12.0–15.0)
Immature Granulocytes: 1 %
Lymphocytes Relative: 9 %
Lymphs Abs: 0.4 10*3/uL — ABNORMAL LOW (ref 0.7–4.0)
MCH: 32 pg (ref 26.0–34.0)
MCHC: 33.8 g/dL (ref 30.0–36.0)
MCV: 94.7 fL (ref 80.0–100.0)
Monocytes Absolute: 0.1 10*3/uL (ref 0.1–1.0)
Monocytes Relative: 1 %
Neutro Abs: 3.7 10*3/uL (ref 1.7–7.7)
Neutrophils Relative %: 89 %
Platelets: 142 10*3/uL — ABNORMAL LOW (ref 150–400)
RBC: 4.12 MIL/uL (ref 3.87–5.11)
RDW: 14.3 % (ref 11.5–15.5)
WBC: 4.2 10*3/uL (ref 4.0–10.5)
nRBC: 0 % (ref 0.0–0.2)

## 2021-01-19 LAB — COMPREHENSIVE METABOLIC PANEL
ALT: 21 U/L (ref 0–44)
AST: 36 U/L (ref 15–41)
Albumin: 2.4 g/dL — ABNORMAL LOW (ref 3.5–5.0)
Alkaline Phosphatase: 52 U/L (ref 38–126)
Anion gap: 7 (ref 5–15)
BUN: 47 mg/dL — ABNORMAL HIGH (ref 8–23)
CO2: 20 mmol/L — ABNORMAL LOW (ref 22–32)
Calcium: 8 mg/dL — ABNORMAL LOW (ref 8.9–10.3)
Chloride: 113 mmol/L — ABNORMAL HIGH (ref 98–111)
Creatinine, Ser: 1.16 mg/dL — ABNORMAL HIGH (ref 0.44–1.00)
GFR, Estimated: 47 mL/min — ABNORMAL LOW (ref >60)
Glucose, Bld: 134 mg/dL — ABNORMAL HIGH (ref 70–99)
Potassium: 4.4 mmol/L (ref 3.5–5.1)
Sodium: 140 mmol/L (ref 135–145)
Total Bilirubin: 0.9 mg/dL (ref 0.3–1.2)
Total Protein: 5.9 g/dL — ABNORMAL LOW (ref 6.5–8.1)

## 2021-01-19 LAB — C-REACTIVE PROTEIN
CRP: 22.4 mg/dL — ABNORMAL HIGH (ref <1.0)
CRP: 21.2 mg/dL — ABNORMAL HIGH (ref <1.0)

## 2021-01-19 LAB — TYPE AND SCREEN
ABO/RH(D): O POS
Antibody Screen: NEGATIVE

## 2021-01-19 LAB — FERRITIN
Ferritin: 1377 ng/mL — ABNORMAL HIGH (ref 11–307)
Ferritin: 1126 ng/mL — ABNORMAL HIGH (ref 11–307)

## 2021-01-19 LAB — MRSA NEXT GEN BY PCR, NASAL: MRSA by PCR Next Gen: DETECTED — AB

## 2021-01-19 LAB — FIBRINOGEN: Fibrinogen: 755 mg/dL — ABNORMAL HIGH (ref 210–475)

## 2021-01-19 LAB — LACTIC ACID, PLASMA
Lactic Acid, Venous: 1.4 mmol/L (ref 0.5–1.9)
Lactic Acid, Venous: 1.5 mmol/L (ref 0.5–1.9)
Lactic Acid, Venous: <0.3 mmol/L — ABNORMAL LOW (ref 0.5–1.9)

## 2021-01-19 LAB — PROTIME-INR
INR: 3.1 — ABNORMAL HIGH (ref 0.8–1.2)
Prothrombin Time: 31.6 seconds — ABNORMAL HIGH (ref 11.4–15.2)

## 2021-01-19 LAB — PHOSPHORUS: Phosphorus: 3.4 mg/dL (ref 2.5–4.6)

## 2021-01-19 LAB — TROPONIN I (HIGH SENSITIVITY): Troponin I (High Sensitivity): 27 ng/L — ABNORMAL HIGH (ref <18)

## 2021-01-19 LAB — TSH: TSH: 0.054 u[IU]/mL — ABNORMAL LOW (ref 0.350–4.500)

## 2021-01-19 LAB — D-DIMER, QUANTITATIVE
D-Dimer, Quant: 1.27 ug/mL-FEU — ABNORMAL HIGH (ref 0.00–0.50)
D-Dimer, Quant: 1.54 ug/mL-FEU — ABNORMAL HIGH (ref 0.00–0.50)

## 2021-01-19 LAB — APTT: aPTT: 33 seconds (ref 24–36)

## 2021-01-19 LAB — MAGNESIUM: Magnesium: 1.6 mg/dL — ABNORMAL LOW (ref 1.7–2.4)

## 2021-01-19 LAB — LACTATE DEHYDROGENASE: LDH: 370 U/L — ABNORMAL HIGH (ref 98–192)

## 2021-01-19 MED ORDER — PREDNISONE 10 MG PO TABS
50.0000 mg | ORAL_TABLET | Freq: Once | ORAL | Status: AC
  Administered 2021-01-19: 50 mg via ORAL
  Filled 2021-01-19: qty 5

## 2021-01-19 MED ORDER — DIPHENHYDRAMINE HCL 25 MG PO CAPS
50.0000 mg | ORAL_CAPSULE | Freq: Once | ORAL | Status: AC
  Administered 2021-01-19: 50 mg via ORAL
  Filled 2021-01-19 (×2): qty 1
  Filled 2021-01-19: qty 2
  Filled 2021-01-19: qty 1

## 2021-01-19 MED ORDER — CHLORHEXIDINE GLUCONATE CLOTH 2 % EX PADS
6.0000 | MEDICATED_PAD | Freq: Every day | CUTANEOUS | Status: DC
  Administered 2021-01-19 – 2021-01-22 (×4): 6 via TOPICAL

## 2021-01-19 MED ORDER — MAGNESIUM SULFATE IN D5W 1-5 GM/100ML-% IV SOLN
1.0000 g | Freq: Once | INTRAVENOUS | Status: AC
  Administered 2021-01-19: 1 g via INTRAVENOUS
  Filled 2021-01-19: qty 100

## 2021-01-19 MED ORDER — PREDNISONE 10 MG PO TABS
50.0000 mg | ORAL_TABLET | Freq: Once | ORAL | Status: AC
  Administered 2021-01-19: 50 mg via ORAL
  Filled 2021-01-19 (×2): qty 5

## 2021-01-19 MED ORDER — SODIUM CHLORIDE 0.9 % IV SOLN
500.0000 mg | INTRAVENOUS | Status: DC
  Administered 2021-01-19 – 2021-01-21 (×3): 500 mg via INTRAVENOUS
  Filled 2021-01-19 (×5): qty 500

## 2021-01-19 MED ORDER — HYDROXYCHLOROQUINE SULFATE 200 MG PO TABS
200.0000 mg | ORAL_TABLET | Freq: Two times a day (BID) | ORAL | Status: DC
  Administered 2021-01-19 – 2021-01-21 (×5): 200 mg via ORAL
  Filled 2021-01-19 (×7): qty 1

## 2021-01-19 MED ORDER — IOHEXOL 350 MG/ML SOLN
100.0000 mL | Freq: Once | INTRAVENOUS | Status: AC | PRN
  Administered 2021-01-20: 100 mL via INTRAVENOUS

## 2021-01-19 MED ORDER — OXYCODONE HCL 5 MG PO TABS
5.0000 mg | ORAL_TABLET | Freq: Four times a day (QID) | ORAL | Status: DC | PRN
  Administered 2021-01-19: 5 mg via ORAL
  Filled 2021-01-19 (×2): qty 1

## 2021-01-19 MED ORDER — LORAZEPAM 2 MG/ML IJ SOLN
1.0000 mg | Freq: Once | INTRAMUSCULAR | Status: AC
  Administered 2021-01-19: 1 mg via INTRAVENOUS
  Filled 2021-01-19: qty 1

## 2021-01-19 MED ORDER — VITAMIN D 25 MCG (1000 UNIT) PO TABS
2000.0000 [IU] | ORAL_TABLET | Freq: Every day | ORAL | Status: DC
  Administered 2021-01-19 – 2021-01-23 (×4): 2000 [IU] via ORAL
  Filled 2021-01-19 (×4): qty 2

## 2021-01-19 MED ORDER — SODIUM CHLORIDE 0.9 % IV SOLN
1.0000 g | INTRAVENOUS | Status: DC
  Administered 2021-01-19 – 2021-01-22 (×4): 1 g via INTRAVENOUS
  Filled 2021-01-19 (×5): qty 10

## 2021-01-19 MED ORDER — PREDNISONE 10 MG PO TABS
50.0000 mg | ORAL_TABLET | ORAL | Status: AC
  Administered 2021-01-19: 50 mg via ORAL
  Filled 2021-01-19: qty 5

## 2021-01-19 MED ORDER — PREGABALIN 25 MG PO CAPS
25.0000 mg | ORAL_CAPSULE | Freq: Two times a day (BID) | ORAL | Status: DC
  Administered 2021-01-19 – 2021-01-21 (×5): 25 mg via ORAL
  Filled 2021-01-19 (×5): qty 1

## 2021-01-19 NOTE — Consult Note (Signed)
PHARMACY CONSULT NOTE  Pharmacy Consult for Electrolyte Monitoring and Replacement   Recent Labs: Potassium (mmol/L)  Date Value  01/19/2021 4.4   Magnesium (mg/dL)  Date Value  01/19/2021 1.6 (L)   Calcium (mg/dL)  Date Value  01/19/2021 8.0 (L)   Albumin (g/dL)  Date Value  01/19/2021 2.4 (L)  03/07/2017 4.4   Phosphorus (mg/dL)  Date Value  01/19/2021 3.4   Sodium (mmol/L)  Date Value  01/19/2021 140  04/22/2019 140   Assessment: Patient is an 80 y/o F with medical history including hypothyroidism, HLD, obesity, HTN, sick sinus syndrome s/p pacemaker, GERD, Afib on Eliquis, CKD who is admitted with respiratory distress in setting of known COVID-19 infection diagnosed two weeks prior to presentation. Pharmacy consulted to assist with electrolyte monitoring and replacement as indicated.  Patient on BiPAP  Goal of Therapy:  Electrolytes within normal limits  Plan:  Given h/o Afib will replete Mg 1.6 w/ MgSO4 2g IV x1.  Other Lytes Wnl, not req'ing replacement at this time --Follow-up electrolytes with AM labs tomorrow  Lorna Dibble 01/19/2021 11:01 AM

## 2021-01-19 NOTE — Progress Notes (Signed)
Chaplain Maggie attempted to speak with patient over the phone, but there was no answer. Chaplain will follow up.

## 2021-01-19 NOTE — Progress Notes (Signed)
Chaplain Maggie spoke with patient's daughter on her cell phone. She is currently in the room with patient. Daughter expressed it is best to wait to speak with pt until after staff has tended to making her more comfortable. A UTI has caused discomfort and they are waiting for relief. Chaplain expects to follow up.

## 2021-01-20 LAB — CBC WITH DIFFERENTIAL/PLATELET
Abs Immature Granulocytes: 0.05 10*3/uL (ref 0.00–0.07)
Basophils Absolute: 0 10*3/uL (ref 0.0–0.1)
Basophils Relative: 0 %
Eosinophils Absolute: 0 10*3/uL (ref 0.0–0.5)
Eosinophils Relative: 0 %
HCT: 41.4 % (ref 36.0–46.0)
Hemoglobin: 14 g/dL (ref 12.0–15.0)
Immature Granulocytes: 1 %
Lymphocytes Relative: 8 %
Lymphs Abs: 0.7 10*3/uL (ref 0.7–4.0)
MCH: 32 pg (ref 26.0–34.0)
MCHC: 33.8 g/dL (ref 30.0–36.0)
MCV: 94.5 fL (ref 80.0–100.0)
Monocytes Absolute: 0.4 10*3/uL (ref 0.1–1.0)
Monocytes Relative: 4 %
Neutro Abs: 7.9 10*3/uL — ABNORMAL HIGH (ref 1.7–7.7)
Neutrophils Relative %: 87 %
Platelets: 164 10*3/uL (ref 150–400)
RBC: 4.38 MIL/uL (ref 3.87–5.11)
RDW: 14.3 % (ref 11.5–15.5)
WBC: 9 10*3/uL (ref 4.0–10.5)
nRBC: 0 % (ref 0.0–0.2)

## 2021-01-20 LAB — COMPREHENSIVE METABOLIC PANEL
ALT: 23 U/L (ref 0–44)
AST: 37 U/L (ref 15–41)
Albumin: 2.7 g/dL — ABNORMAL LOW (ref 3.5–5.0)
Alkaline Phosphatase: 60 U/L (ref 38–126)
Anion gap: 7 (ref 5–15)
BUN: 54 mg/dL — ABNORMAL HIGH (ref 8–23)
CO2: 23 mmol/L (ref 22–32)
Calcium: 8.6 mg/dL — ABNORMAL LOW (ref 8.9–10.3)
Chloride: 112 mmol/L — ABNORMAL HIGH (ref 98–111)
Creatinine, Ser: 1 mg/dL (ref 0.44–1.00)
GFR, Estimated: 57 mL/min — ABNORMAL LOW (ref >60)
Glucose, Bld: 154 mg/dL — ABNORMAL HIGH (ref 70–99)
Potassium: 4.2 mmol/L (ref 3.5–5.1)
Sodium: 142 mmol/L (ref 135–145)
Total Bilirubin: 0.9 mg/dL (ref 0.3–1.2)
Total Protein: 6.6 g/dL (ref 6.5–8.1)

## 2021-01-20 LAB — PHOSPHORUS: Phosphorus: 3.2 mg/dL (ref 2.5–4.6)

## 2021-01-20 LAB — MAGNESIUM: Magnesium: 2.2 mg/dL (ref 1.7–2.4)

## 2021-01-20 LAB — C-REACTIVE PROTEIN: CRP: 19.2 mg/dL — ABNORMAL HIGH (ref <1.0)

## 2021-01-20 LAB — FERRITIN: Ferritin: 1047 ng/mL — ABNORMAL HIGH (ref 11–307)

## 2021-01-20 LAB — D-DIMER, QUANTITATIVE: D-Dimer, Quant: 1.27 ug/mL-FEU — ABNORMAL HIGH (ref 0.00–0.50)

## 2021-01-20 MED ORDER — LORAZEPAM 2 MG/ML IJ SOLN
1.0000 mg | Freq: Once | INTRAMUSCULAR | Status: AC
  Administered 2021-01-20: 1 mg via INTRAVENOUS
  Filled 2021-01-20: qty 1

## 2021-01-20 MED ORDER — HYDROXYZINE HCL 25 MG PO TABS
25.0000 mg | ORAL_TABLET | Freq: Once | ORAL | Status: AC
  Administered 2021-01-20: 25 mg via ORAL
  Filled 2021-01-20 (×2): qty 1

## 2021-01-20 MED ORDER — BISACODYL 10 MG RE SUPP
10.0000 mg | Freq: Once | RECTAL | Status: AC
  Administered 2021-01-20: 10 mg via RECTAL
  Filled 2021-01-20: qty 1

## 2021-01-20 NOTE — Progress Notes (Signed)
Pt was placed on Bipap for transport to CT because of high oxygen demand. She tolerated Bipap well and returned to ICU without incident. Pt was then placed back on 15L HFNC with NRB per pt request.

## 2021-01-20 NOTE — Consult Note (Signed)
PHARMACY CONSULT NOTE  Pharmacy Consult for Electrolyte Monitoring and Replacement   Recent Labs: Potassium (mmol/L)  Date Value  01/20/2021 4.2   Magnesium (mg/dL)  Date Value  01/20/2021 2.2   Calcium (mg/dL)  Date Value  01/20/2021 8.6 (L)   Albumin (g/dL)  Date Value  01/20/2021 2.7 (L)  03/07/2017 4.4   Phosphorus (mg/dL)  Date Value  01/20/2021 3.2   Sodium (mmol/L)  Date Value  01/20/2021 142  04/22/2019 140   Assessment: Patient is an 80 y/o F with medical history including hypothyroidism, HLD, obesity, HTN, sick sinus syndrome s/p pacemaker, GERD, Afib on Eliquis, CKD who is admitted with respiratory distress in setting of known COVID-19 infection diagnosed two weeks prior to presentation. Pharmacy consulted to assist with electrolyte monitoring and replacement as indicated.  Goal of Therapy:  Electrolytes within normal limits  Plan:  No replacement required.  --Follow-up electrolytes with AM labs tomorrow  Oswald Hillock 01/20/2021 8:06 AM

## 2021-01-20 NOTE — Progress Notes (Signed)
CRITICAL CARE PROGRESS NOTE    Name: [name] MRN: [id number] DOB: 30-Jul-1940     LOS: 2   SUBJECTIVE FINDINGS & SIGNIFICANT EVENTS    Patient description:  80 y.o with significant PMH of chronic atrial fibrillation on Eliquis, sick sinus syndrome s/p pacemaker, HTN, HLD, CKD OSA not on CPAP, hypothyroidism and bilateral chronic hip pain who presented to the ED with chief complaints of progressive shortness of breath.   Patient developed fevers, chills, generalized weakness, cough, lethargy, and dyspnea several days before presenting to the ED. Patient and her husband took a home COVID test on 01/10/21 which came back positive. Per daughter, her primary medical doctor started her on Paxlovid, Azithromycin and Prednisone x 5 days with no improvement. Patient continued to have progressive dyspnea, refused po intake and meds due to lack of appetite and lethargy. Patient's symptoms worsened today prompting her daughter to call EMS. On EMS arrival, patient was found to be hypoxic with sats in the 50's on R.A. She was placed on 15L/min via NRB and transported to the ED.  Patient was placed on BiPAP due to significant dyspnea with isolation precautions for COVID-19. She was started on Remdesivir and steroids. PCCM consulted for admission and further management.  01/20/21- patient s/p CTPE no PE but does show bilateral GGO infiltrates consistent with COVID19   Lines/tubes : External Urinary Catheter (Active)  Collection Container Dedicated Suction Canister 01/20/21 0800  Suction (Verified suction is between 40-80 mmHg) Yes 01/20/21 0800  Securement Method Leg strap 01/19/21 0700  Site Assessment Clean;Intact 01/20/21 0800  Intervention Female External Urinary Catheter Replaced 01/20/21 0800  Output (mL) 150 mL 01/20/21  1218    Microbiology/Sepsis markers: Results for orders placed or performed during the hospital encounter of 01/23/2021  Blood culture (single)     Status: None (Preliminary result)   Collection Time: 12/30/2020  7:26 PM   Specimen: BLOOD  Result Value Ref Range Status   Specimen Description BLOOD RIGHT ANTECUBITAL  Final   Special Requests   Final    BOTTLES DRAWN AEROBIC AND ANAEROBIC Blood Culture adequate volume   Culture   Final    NO GROWTH 2 DAYS Performed at [facility], [address], [place], [place] [address]    Report Status PENDING  Incomplete  Resp Panel by RT-PCR (Flu A&B, Covid) Nasopharyngeal Swab     Status: Abnormal   Collection Time: 12/29/2020  8:52 PM   Specimen: Nasopharyngeal Swab; Nasopharyngeal(NP) swabs in vial transport medium  Result Value Ref Range Status   SARS Coronavirus 2 by RT PCR POSITIVE (A) NEGATIVE Final    Comment: RESULT CALLED TO, READ BACK BY AND VERIFIED WITHToni Arthurs RN 2252 01/04/2021 HNM (NOTE) SARS-CoV-2 target nucleic acids are DETECTED.  The SARS-CoV-2 RNA is generally detectable in upper respiratory specimens during the acute phase of infection. Positive results are indicative of the presence of the identified virus, but do not rule out bacterial infection or co-infection with other pathogens not detected by the test. Clinical correlation with patient history and other diagnostic information is necessary to determine patient infection status. The expected result is Negative.  Fact Sheet for Patients: EntrepreneurPulse.com.au  Fact Sheet for Healthcare Providers: IncredibleEmployment.be  This test is not yet approved or cleared by the [place] FDA and  has been authorized for detection and/or diagnosis of SARS-CoV-2 by FDA under an Emergency Use Authorization (EUA).  This EUA will remain in effect (meaning this test can b e used)  for the duration of  the COVID-19  declaration under Section 564(b)(1) of the Act, 21 U.S.C. section 360bbb-3(b)(1), unless the authorization is terminated or revoked sooner.     Influenza A by PCR NEGATIVE NEGATIVE Final   Influenza B by PCR NEGATIVE NEGATIVE Final    Comment: (NOTE) The Xpert Xpress SARS-CoV-2/FLU/RSV plus assay is intended as an aid in the diagnosis of influenza from Nasopharyngeal swab specimens and should not be used as a sole basis for treatment. Nasal washings and aspirates are unacceptable for Xpert Xpress SARS-CoV-2/FLU/RSV testing.  Fact Sheet for Patients: EntrepreneurPulse.com.au  Fact Sheet for Healthcare Providers: IncredibleEmployment.be  This test is not yet approved or cleared by the [place] FDA and has been authorized for detection and/or diagnosis of SARS-CoV-2 by FDA under an Emergency Use Authorization (EUA). This EUA will remain in effect (meaning this test can be used) for the duration of the COVID-19 declaration under Section 564(b)(1) of the Act, 21 U.S.C. section 360bbb-3(b)(1), unless the authorization is terminated or revoked.  Performed at [facility], [address] [place], [place] [address]   MRSA Next Gen by PCR, Nasal     Status: Abnormal   Collection Time: 01/01/2021 11:14 PM   Specimen: Nasal Mucosa; Nasal Swab  Result Value Ref Range Status   MRSA by PCR Next Gen DETECTED (A) NOT DETECTED Final    Comment: RESULT CALLED TO, READ BACK BY AND VERIFIED WITH: JUSTIN ODELL @0139  ON 01/19/21 SKL (NOTE) The GeneXpert MRSA Assay (FDA approved for NASAL specimens only), is one component of a comprehensive MRSA colonization surveillance program. It is not intended to diagnose MRSA infection nor to guide or monitor treatment for MRSA infections. Test performance is not FDA approved in patients less than 46 years old. Performed at [facility], [address] [place], [place] [address]   Culture, blood  (routine x 2)     Status: None (Preliminary result)   Collection Time: 01/15/2021 11:51 PM   Specimen: BLOOD LEFT FOREARM  Result Value Ref Range Status   Specimen Description BLOOD LEFT FOREARM  Final   Special Requests   Final    BOTTLES DRAWN AEROBIC AND ANAEROBIC Blood Culture adequate volume   Culture   Final    NO GROWTH 1 DAY Performed at [facility], [address], [place],  [address]    Report Status PENDING  Incomplete    Anti-infectives:  Anti-infectives (From admission, onward)    Start     Dose/Rate Route Frequency Ordered Stop   01/19/21 1315  hydroxychloroquine (PLAQUENIL) tablet 200 mg        200 mg Oral 2 times daily 01/19/21 1215     01/19/21 1000  remdesivir 100 mg in sodium chloride 0.9 % 100 mL IVPB       See Hyperspace for full Linked Orders Report.   100 mg 200 mL/hr over 30 Minutes Intravenous Daily 01/14/2021 2202 01/23/21 0959   01/19/21 0200  cefTRIAXone (ROCEPHIN) 1 g in sodium chloride 0.9 % 100 mL IVPB        1 g 200 mL/hr over 30 Minutes Intravenous Every 24 hours 01/19/21 0100     01/19/21 0200  azithromycin (ZITHROMAX) 500 mg in sodium chloride 0.9 % 250 mL IVPB        500 mg 250 mL/hr over 60 Minutes Intravenous Every 24 hours 01/19/21 0100     01/19/2021 2215  remdesivir 200 mg in sodium chloride 0.9% 250 mL IVPB  See Hyperspace for full Linked Orders Report.   200 mg 580 mL/hr over 30 Minutes Intravenous Once 01/10/2021 2202 01/19/21 0700        PAST MEDICAL HISTORY   Past Medical History:  Diagnosis Date   Anxiety    Aortic insufficiency    Trivial   Arthritis    Asthma    Bronchitis    Bursitis, hip    Claustrophobia    Claustrophobia    Dysrhythmia    a fib   GERD (gastroesophageal reflux disease)    history of   Gout    Hyperlipidemia    Hypertension    Hypothyroidism    Mitral regurgitation    Obesity    Panic attacks    Paroxysmal atrial fibrillation (HCC)    PTSD (post-traumatic stress disorder)     Rhinitis    Sick sinus syndrome ([place])    Sleep apnea    was ordered CPAP, but never fitted for one years ago, Dr has retired     SURGICAL HISTORY   Past Surgical History:  Procedure Laterality Date   [place] N/A 08/03/2015   Procedure: [place];  Surgeon: Alden Hipp, MD;  Location: [facility];  Service: Gynecology;  Laterality: N/A;  Patient has an ICD    INSERT / REPLACE / REMOVE PACEMAKER     PACEMAKER INSERTION     Status post insertion   PPM GENERATOR CHANGEOUT N/A 05/02/2019   Procedure: PPM GENERATOR CHANGEOUT;  Surgeon: Deboraha Sprang, MD;  Location: [facility];  Service: Cardiovascular;  Laterality: N/A;   ROTATOR CUFF REPAIR     left   Status post child birth x1     TONSILLECTOMY     TOTAL HIP ARTHROPLASTY Left 12/22/2015   Procedure: TOTAL HIP ARTHROPLASTY;  Surgeon: Dereck Leep, MD;  Location: [facility];  Service: Orthopedics;  Laterality: Left;   TOTAL HIP REVISION Left 03/01/2018   Procedure: TOTAL HIP REVISION;  Surgeon: Dereck Leep, MD;  Location: [facility];  Service: Orthopedics;  Laterality: Left;     FAMILY HISTORY   Family History  Problem Relation Age of Onset   Stroke Mother    Heart disease Mother    Alzheimer's disease Father    Hypertension Sister    Cancer Brother    Arthritis Sister    Cancer Maternal Grandfather      SOCIAL HISTORY   Social History   Tobacco Use   Smoking status: Never   Smokeless tobacco: Never  Vaping Use   Vaping Use: Never used  Substance Use Topics   Alcohol use: No    Alcohol/week: 0.0 standard drinks   Drug use: No     MEDICATIONS   Current Medication:  Current Facility-Administered Medications:    albuterol (VENTOLIN HFA) 108 (90 Base) MCG/ACT inhaler 2 puff, 2 puff, Inhalation, Q6H, Ouma, Bing Neighbors, NP, 2 puff at 01/20/21 1304   ascorbic acid (VITAMIN C) tablet 500 mg, 500  mg, Oral, Daily, Ouma, Bing Neighbors, NP, 500 mg at 01/20/21 1023   azithromycin (ZITHROMAX) 500 mg in sodium chloride 0.9 % 250 mL IVPB, 500 mg, Intravenous, Q24H, Ouma, Bing Neighbors, NP, Stopped at 01/20/21 0446   cefTRIAXone (ROCEPHIN) 1 g in sodium chloride 0.9 % 100 mL IVPB, 1 g, Intravenous, Q24H, Ouma, Bing Neighbors, NP, Stopped at 01/20/21 0330   Chlorhexidine Gluconate Cloth 2 % PADS 6 each, 6 each, Topical,  Daily, Ottie Glazier, MD, 6 each at 01/20/21 1023   chlorpheniramine-HYDROcodone (TUSSIONEX) 10-8 MG/5ML suspension 5 mL, 5 mL, Oral, Q12H PRN, Lang Snow, NP   cholecalciferol (VITAMIN D3) tablet 2,000 Units, 2,000 Units, Oral, Daily, Lorna Dibble, RPH, 2,000 Units at 01/20/21 1023   docusate sodium (COLACE) capsule 100 mg, 100 mg, Oral, BID PRN, Lang Snow, NP   guaiFENesin-dextromethorphan (ROBITUSSIN DM) 100-10 MG/5ML syrup 10 mL, 10 mL, Oral, Q4H PRN, Lang Snow, NP   heparin injection 5,000 Units, 5,000 Units, Subcutaneous, Q8H, Lang Snow, NP, 5,000 Units at 01/20/21 1304   hydroxychloroquine (PLAQUENIL) tablet 200 mg, 200 mg, Oral, BID, Beers, Shanon Brow, RPH, 200 mg at 01/20/21 1023   oxyCODONE (Oxy IR/ROXICODONE) immediate release tablet 5 mg, 5 mg, Oral, Q6H PRN, Beers, Shanon Brow, RPH, 5 mg at 01/19/21 1255   polyethylene glycol (MIRALAX / GLYCOLAX) packet 17 g, 17 g, Oral, Daily PRN, Lang Snow, NP, 17 g at 01/20/21 1304   pregabalin (LYRICA) capsule 25 mg, 25 mg, Oral, BID, Beers, Shanon Brow, RPH, 25 mg at 01/20/21 1023   [COMPLETED] remdesivir 200 mg in sodium chloride 0.9% 250 mL IVPB, 200 mg, Intravenous, Once, Stopping Infusion hung by another clincian at 01/19/21 0700 **FOLLOWED BY** remdesivir 100 mg in sodium chloride 0.9 % 100 mL IVPB, 100 mg, Intravenous, Daily, Benita Gutter, RPH, Stopped at 01/20/21 1059   zinc sulfate capsule 220 mg, 220 mg, Oral, Daily, Lang Snow,  NP, 220 mg at 01/20/21 1028    ALLERGIES   Contrast media [iodinated diagnostic agents]    REVIEW OF SYSTEMS     10 point ROS negative except asper subj findings  PHYSICAL EXAMINATION   Vital Signs: Temp:  [97.6 F (36.4 C)-98.4 F (36.9 C)] 97.6 F (36.4 C) (11/24 1200) Pulse Rate:  [82-122] 93 (11/24 1200) Resp:  [16-28] 27 (11/24 1200) BP: (96-128)/(38-95) 101/55 (11/24 1200) SpO2:  [89 %-94 %] 90 % (11/24 1200) FiO2 (%):  [100 %] 100 % (11/24 0801)  GENERAL:mild distress due to COVID19 and abd discomfort HEAD: Normocephalic, atraumatic.  EYES: Pupils equal, round, reactive to light.  No scleral icterus.  MOUTH: Moist mucosal membrane. NECK: Supple. No thyromegaly. No nodules. No JVD.  PULMONARY: mild ronchi CARDIOVASCULAR: S1 and S2. Regular rate and rhythm. No murmurs, rubs, or gallops.  GASTROINTESTINAL: Soft, nontender, non-distended. No masses. Positive bowel sounds. No hepatosplenomegaly.  MUSCULOSKELETAL: No swelling, clubbing, or edema.  NEUROLOGIC: Mild distress due to acute illness SKIN:intact,warm,dry   PERTINENT DATA     Infusions:  azithromycin Stopped (01/20/21 0446)   cefTRIAXone (ROCEPHIN)  IV Stopped (01/20/21 0330)   remdesivir 100 mg in NS 100 mL Stopped (01/20/21 1059)   Scheduled Medications:  albuterol  2 puff Inhalation Q6H   vitamin C  500 mg Oral Daily   Chlorhexidine Gluconate Cloth  6 each Topical Daily   cholecalciferol  2,000 Units Oral Daily   heparin  5,000 Units Subcutaneous Q8H   hydroxychloroquine  200 mg Oral BID   pregabalin  25 mg Oral BID   zinc sulfate  220 mg Oral Daily   PRN Medications: chlorpheniramine-HYDROcodone, docusate sodium, guaiFENesin-dextromethorphan, oxyCODONE, polyethylene glycol Hemodynamic parameters:   Intake/Output: 11/23 0701 - 11/24 0700 In: 579.9 [P.O.:30; IV Piggyback:549.9] Out: 420 [Urine:420]  Ventilator  Settings: FiO2 (%):  [100 %] 100 %   LAB RESULTS:  Basic Metabolic  Panel: Recent Labs  Lab 01/08/2021 1926 01/19/21 0313 01/20/21 0323  NA 137  140 142  K 4.0 4.4 4.2  CL 112* 113* 112*  CO2 18* 20* 23  GLUCOSE 100* 134* 154*  BUN 46* 47* 54*  CREATININE 1.19* 1.16* 1.00  CALCIUM 7.8* 8.0* 8.6*  MG  --  1.6* 2.2  PHOS  --  3.4 3.2   Liver Function Tests: Recent Labs  Lab 01/15/2021 1926 01/19/21 0313 01/20/21 0323  AST 33 36 37  ALT 21 21 23   ALKPHOS 52 52 60  BILITOT 1.2 0.9 0.9  PROT 6.0* 5.9* 6.6  ALBUMIN 2.5* 2.4* 2.7*   No results for input(s): LIPASE, AMYLASE in the last 168 hours. No results for input(s): AMMONIA in the last 168 hours. CBC: Recent Labs  Lab 01/03/2021 1926 01/20/2021 2351 01/19/21 0313 01/20/21 0323  WBC 6.7 6.7 4.2 9.0  NEUTROABS 5.8  --  3.7 7.9*  HGB 13.6 13.6 13.2 14.0  HCT 40.4 40.3 39.0 41.4  MCV 94.6 93.9 94.7 94.5  PLT 146* 144* 142* 164   Cardiac Enzymes: No results for input(s): CKTOTAL, CKMB, CKMBINDEX, TROPONINI in the last 168 hours. BNP: Invalid input(s): POCBNP CBG: Recent Labs  Lab 01/17/2021 2323  GLUCAP 31       IMAGING RESULTS:  Imaging: CT Angio Chest Pulmonary Embolism (PE) W or WO Contrast  Result Date: 01/20/2021 CLINICAL DATA:  Chest pain and shortness of breath with abdominal distension EXAM: CT ANGIOGRAPHY CHEST CT ABDOMEN AND PELVIS WITH CONTRAST TECHNIQUE: Multidetector CT imaging of the chest was performed using the standard protocol during bolus administration of intravenous contrast. Multiplanar CT image reconstructions and MIPs were obtained to evaluate the vascular anatomy. Multidetector CT imaging of the abdomen and pelvis was performed using the standard protocol during bolus administration of intravenous contrast. CONTRAST:  174mL OMNIPAQUE IOHEXOL 350 MG/ML SOLN COMPARISON:  01/13/2021 FINDINGS: CTA CHEST FINDINGS Cardiovascular: Atherosclerotic calcifications of the thoracic aorta are noted. No aneurysmal dilatation or dissection is noted. No cardiac enlargement is  seen. Coronary calcifications are noted. Pulmonary artery shows a normal branching pattern without evidence of filling defect to suggest pulmonary embolism. Mediastinum/Nodes: Thoracic inlet is within normal limits. No sizable hilar or mediastinal adenopathy is noted. The esophagus as visualized is within normal limits. Lungs/Pleura: Lungs are well aerated bilaterally. Patchy airspace opacity is noted throughout both lung similar to that seen on prior chest x-ray consistent with multifocal pneumonia. These changes may represent sequelae from the prior COVID infection. No sizable effusion is noted. Musculoskeletal: Degenerative changes of the thoracic spine are noted. No acute rib abnormality is noted. Review of the MIP images confirms the above findings. CT ABDOMEN and PELVIS FINDINGS Hepatobiliary: No focal liver abnormality is seen. No gallstones, gallbladder wall thickening, or biliary dilatation. Pancreas: Unremarkable. No pancreatic ductal dilatation or surrounding inflammatory changes. Spleen: Normal in size without focal abnormality. Adrenals/Urinary Tract: Adrenal glands are within normal limits. Kidneys demonstrate a normal enhancement pattern bilaterally. Small 1 cm cyst is noted within the left kidney. No obstructive changes are seen. The bladder is well distended. Stomach/Bowel: Fecal material is noted within the rectal vault consistent with a degree of constipation. No obstructive changes are seen. The appendix is not well visualized. No inflammatory changes to suggest appendicitis are noted. Small bowel and stomach are unremarkable. Vascular/Lymphatic: Aortic atherosclerosis. No enlarged abdominal or pelvic lymph nodes. Reproductive: Uterus and bilateral adnexa are unremarkable. Other: No abdominal wall hernia or abnormality. No abdominopelvic ascites. Musculoskeletal: Left hip prosthesis is noted. Degenerative changes of lumbar spine are noted. Review of the MIP  images confirms the above findings.  IMPRESSION: CTA of the chest: No acute pulmonary embolism is noted. Patchy airspace opacities are noted similar to that seen on recent chest x-ray which may be of sequelae from the recent COVID infection. No sizable effusion is seen. CT of the abdomen and pelvis: Prominent fecal material within the rectal vault which may represent a degree of impaction. No acute abdominal abnormality is seen. Aortic Atherosclerosis (ICD10-I70.0). Electronically Signed   By: Inez Catalina M.D.   On: 01/20/2021 00:28   CT ABDOMEN PELVIS W CONTRAST  Result Date: 01/20/2021 CLINICAL DATA:  Chest pain and shortness of breath with abdominal distension EXAM: CT ANGIOGRAPHY CHEST CT ABDOMEN AND PELVIS WITH CONTRAST TECHNIQUE: Multidetector CT imaging of the chest was performed using the standard protocol during bolus administration of intravenous contrast. Multiplanar CT image reconstructions and MIPs were obtained to evaluate the vascular anatomy. Multidetector CT imaging of the abdomen and pelvis was performed using the standard protocol during bolus administration of intravenous contrast. CONTRAST:  147mL OMNIPAQUE IOHEXOL 350 MG/ML SOLN COMPARISON:  01/17/2021 FINDINGS: CTA CHEST FINDINGS Cardiovascular: Atherosclerotic calcifications of the thoracic aorta are noted. No aneurysmal dilatation or dissection is noted. No cardiac enlargement is seen. Coronary calcifications are noted. Pulmonary artery shows a normal branching pattern without evidence of filling defect to suggest pulmonary embolism. Mediastinum/Nodes: Thoracic inlet is within normal limits. No sizable hilar or mediastinal adenopathy is noted. The esophagus as visualized is within normal limits. Lungs/Pleura: Lungs are well aerated bilaterally. Patchy airspace opacity is noted throughout both lung similar to that seen on prior chest x-ray consistent with multifocal pneumonia. These changes may represent sequelae from the prior COVID infection. No sizable effusion is  noted. Musculoskeletal: Degenerative changes of the thoracic spine are noted. No acute rib abnormality is noted. Review of the MIP images confirms the above findings. CT ABDOMEN and PELVIS FINDINGS Hepatobiliary: No focal liver abnormality is seen. No gallstones, gallbladder wall thickening, or biliary dilatation. Pancreas: Unremarkable. No pancreatic ductal dilatation or surrounding inflammatory changes. Spleen: Normal in size without focal abnormality. Adrenals/Urinary Tract: Adrenal glands are within normal limits. Kidneys demonstrate a normal enhancement pattern bilaterally. Small 1 cm cyst is noted within the left kidney. No obstructive changes are seen. The bladder is well distended. Stomach/Bowel: Fecal material is noted within the rectal vault consistent with a degree of constipation. No obstructive changes are seen. The appendix is not well visualized. No inflammatory changes to suggest appendicitis are noted. Small bowel and stomach are unremarkable. Vascular/Lymphatic: Aortic atherosclerosis. No enlarged abdominal or pelvic lymph nodes. Reproductive: Uterus and bilateral adnexa are unremarkable. Other: No abdominal wall hernia or abnormality. No abdominopelvic ascites. Musculoskeletal: Left hip prosthesis is noted. Degenerative changes of lumbar spine are noted. Review of the MIP images confirms the above findings. IMPRESSION: CTA of the chest: No acute pulmonary embolism is noted. Patchy airspace opacities are noted similar to that seen on recent chest x-ray which may be of sequelae from the recent COVID infection. No sizable effusion is seen. CT of the abdomen and pelvis: Prominent fecal material within the rectal vault which may represent a degree of impaction. No acute abdominal abnormality is seen. Aortic Atherosclerosis (ICD10-I70.0). Electronically Signed   By: Inez Catalina M.D.   On: 01/20/2021 00:28   DG Chest Portable 1 View  Result Date: 01/26/2021 CLINICAL DATA:  Shortness of breath EXAM:  PORTABLE CHEST 1 VIEW COMPARISON:  05/01/2014 FINDINGS: Cardiac shadow is stable. Pacing device is noted. Aortic calcifications are again  seen. Patchy airspace disease is noted bilaterally predominately in the upper lobes which may be related to the recent COVID diagnosis. No sizable effusion is seen. No bony abnormality is noted. IMPRESSION: Patchy airspace opacities primarily in the upper lobes which may be sequelae from the prior COVID infection. Electronically Signed   By: Inez Catalina M.D.   On: 12/28/2020 19:51   @PROBHOSP @ CT Angio Chest Pulmonary Embolism (PE) W or WO Contrast  Result Date: 01/20/2021 CLINICAL DATA:  Chest pain and shortness of breath with abdominal distension EXAM: CT ANGIOGRAPHY CHEST CT ABDOMEN AND PELVIS WITH CONTRAST TECHNIQUE: Multidetector CT imaging of the chest was performed using the standard protocol during bolus administration of intravenous contrast. Multiplanar CT image reconstructions and MIPs were obtained to evaluate the vascular anatomy. Multidetector CT imaging of the abdomen and pelvis was performed using the standard protocol during bolus administration of intravenous contrast. CONTRAST:  157mL OMNIPAQUE IOHEXOL 350 MG/ML SOLN COMPARISON:  01/19/2021 FINDINGS: CTA CHEST FINDINGS Cardiovascular: Atherosclerotic calcifications of the thoracic aorta are noted. No aneurysmal dilatation or dissection is noted. No cardiac enlargement is seen. Coronary calcifications are noted. Pulmonary artery shows a normal branching pattern without evidence of filling defect to suggest pulmonary embolism. Mediastinum/Nodes: Thoracic inlet is within normal limits. No sizable hilar or mediastinal adenopathy is noted. The esophagus as visualized is within normal limits. Lungs/Pleura: Lungs are well aerated bilaterally. Patchy airspace opacity is noted throughout both lung similar to that seen on prior chest x-ray consistent with multifocal pneumonia. These changes may represent sequelae  from the prior COVID infection. No sizable effusion is noted. Musculoskeletal: Degenerative changes of the thoracic spine are noted. No acute rib abnormality is noted. Review of the MIP images confirms the above findings. CT ABDOMEN and PELVIS FINDINGS Hepatobiliary: No focal liver abnormality is seen. No gallstones, gallbladder wall thickening, or biliary dilatation. Pancreas: Unremarkable. No pancreatic ductal dilatation or surrounding inflammatory changes. Spleen: Normal in size without focal abnormality. Adrenals/Urinary Tract: Adrenal glands are within normal limits. Kidneys demonstrate a normal enhancement pattern bilaterally. Small 1 cm cyst is noted within the left kidney. No obstructive changes are seen. The bladder is well distended. Stomach/Bowel: Fecal material is noted within the rectal vault consistent with a degree of constipation. No obstructive changes are seen. The appendix is not well visualized. No inflammatory changes to suggest appendicitis are noted. Small bowel and stomach are unremarkable. Vascular/Lymphatic: Aortic atherosclerosis. No enlarged abdominal or pelvic lymph nodes. Reproductive: Uterus and bilateral adnexa are unremarkable. Other: No abdominal wall hernia or abnormality. No abdominopelvic ascites. Musculoskeletal: Left hip prosthesis is noted. Degenerative changes of lumbar spine are noted. Review of the MIP images confirms the above findings. IMPRESSION: CTA of the chest: No acute pulmonary embolism is noted. Patchy airspace opacities are noted similar to that seen on recent chest x-ray which may be of sequelae from the recent COVID infection. No sizable effusion is seen. CT of the abdomen and pelvis: Prominent fecal material within the rectal vault which may represent a degree of impaction. No acute abdominal abnormality is seen. Aortic Atherosclerosis (ICD10-I70.0). Electronically Signed   By: Inez Catalina M.D.   On: 01/20/2021 00:28   CT ABDOMEN PELVIS W CONTRAST  Result  Date: 01/20/2021 CLINICAL DATA:  Chest pain and shortness of breath with abdominal distension EXAM: CT ANGIOGRAPHY CHEST CT ABDOMEN AND PELVIS WITH CONTRAST TECHNIQUE: Multidetector CT imaging of the chest was performed using the standard protocol during bolus administration of intravenous contrast. Multiplanar CT image reconstructions and  MIPs were obtained to evaluate the vascular anatomy. Multidetector CT imaging of the abdomen and pelvis was performed using the standard protocol during bolus administration of intravenous contrast. CONTRAST:  152mL OMNIPAQUE IOHEXOL 350 MG/ML SOLN COMPARISON:  01/01/2021 FINDINGS: CTA CHEST FINDINGS Cardiovascular: Atherosclerotic calcifications of the thoracic aorta are noted. No aneurysmal dilatation or dissection is noted. No cardiac enlargement is seen. Coronary calcifications are noted. Pulmonary artery shows a normal branching pattern without evidence of filling defect to suggest pulmonary embolism. Mediastinum/Nodes: Thoracic inlet is within normal limits. No sizable hilar or mediastinal adenopathy is noted. The esophagus as visualized is within normal limits. Lungs/Pleura: Lungs are well aerated bilaterally. Patchy airspace opacity is noted throughout both lung similar to that seen on prior chest x-ray consistent with multifocal pneumonia. These changes may represent sequelae from the prior COVID infection. No sizable effusion is noted. Musculoskeletal: Degenerative changes of the thoracic spine are noted. No acute rib abnormality is noted. Review of the MIP images confirms the above findings. CT ABDOMEN and PELVIS FINDINGS Hepatobiliary: No focal liver abnormality is seen. No gallstones, gallbladder wall thickening, or biliary dilatation. Pancreas: Unremarkable. No pancreatic ductal dilatation or surrounding inflammatory changes. Spleen: Normal in size without focal abnormality. Adrenals/Urinary Tract: Adrenal glands are within normal limits. Kidneys demonstrate a  normal enhancement pattern bilaterally. Small 1 cm cyst is noted within the left kidney. No obstructive changes are seen. The bladder is well distended. Stomach/Bowel: Fecal material is noted within the rectal vault consistent with a degree of constipation. No obstructive changes are seen. The appendix is not well visualized. No inflammatory changes to suggest appendicitis are noted. Small bowel and stomach are unremarkable. Vascular/Lymphatic: Aortic atherosclerosis. No enlarged abdominal or pelvic lymph nodes. Reproductive: Uterus and bilateral adnexa are unremarkable. Other: No abdominal wall hernia or abnormality. No abdominopelvic ascites. Musculoskeletal: Left hip prosthesis is noted. Degenerative changes of lumbar spine are noted. Review of the MIP images confirms the above findings. IMPRESSION: CTA of the chest: No acute pulmonary embolism is noted. Patchy airspace opacities are noted similar to that seen on recent chest x-ray which may be of sequelae from the recent COVID infection. No sizable effusion is seen. CT of the abdomen and pelvis: Prominent fecal material within the rectal vault which may represent a degree of impaction. No acute abdominal abnormality is seen. Aortic Atherosclerosis (ICD10-I70.0). Electronically Signed   By: Inez Catalina M.D.   On: 01/20/2021 00:28         ASSESSMENT AND PLAN   Acute Hypoxic Respiratory Failure secondary to COVID-19 Pneumonia  Tested + 11/14 treated with Paxlovid, Prednisone and Azithromycin x 5 days -Place on HHFNC, wean FiO2 as tolerated -BiPAP, wean as tolerated -Supplemental O2 as needed to maintain O2 saturations 88 to 92% -Follow intermittent ABG and chest x-ray as needed -Ensure adequate pulmonary hygiene  -F/u cultures, trend PCT, wbc, monitor fever curve -As needed bronchodilators -Start Empiric abx coverage given risk factors (Asthma, Bronchitis ) with Ceftriaxone + Azithromycin pending cultures, consider de-escalation if  appropriate -Start Remdesivir x5 days  -Continue steroids. -Encourage OOB, IS, FV, and awake proning if able -Continue airborne, contact precautions for 21 days from positive testing. -Daily CRP, CMP, CBC, D-dimer -Heparin prophylactic dose.     Partial SBO    - CT abd with dialated loops and Fecal material is noted within the rectal vault consistent with a degree of constipation  AKI on CKD Stage III BUN/Cr.: abnormal -Monitor I&O's / urinary output -Follow BMP -Ensure adequate renal perfusion -  Avoid nephrotoxic agents as able -Replace electrolytes as indicated   Chronic Atrial Fibrlilation Hx: SSS s/p pacemaker Thromboembolic risk factors ( age  -2, HTN-1, Gender-1) for a CHADSVASc Score of >=4 -Hold Eliquis until able to take po -Heparin for now -Metroprolol for rate control   Hypertension HLD  -Continuous cardiac monitoring -Maintain MAP greater than 65 -Continue metoprolol as BP permits -Hold Spironolactone for now -Repeat 2D Echocardiogram   Chronic Pain Syndrome -Continue home meds oxy, Pregabalin once able to take po   Hypothyroidism -Check TSH -Continue Armour     Best practice:  Diet:  NPO Pain/Anxiety/Delirium protocol (if indicated): No VAP protocol (if indicated): Not indicated DVT prophylaxis: Subcutaneous Heparin GI prophylaxis: H2B Glucose control:  SSI No Central venous access:  N/A Arterial line:  N/A Foley:  N/A Mobility:  bed rest  PT consulted: N/A Daily multidisciplinary goals of care discussion  Code Status:  limited Disposition: ICU     = Goals of Care = Code Status Order: LIMITED CODE  Primary Emergency Contact: [place], Home Phone: [phone] Discussed code status with patient's Husband and Duaghter Tammy. They confirm that patient wishes to pursue full aggressive treatment and intervention options, including CPR/ACLS Drugs and Defibrillation EXCEPT intubation, goals of care will be addressed on going with family if that  should become necessary.  Family would like to be immediately contacted in such situations.   Critical care provider statement:   Total critical care time: 33 minutes   Performed by: Lanney Gins MD   Critical care time was exclusive of separately billable procedures and treating other patients.   Critical care was necessary to treat or prevent imminent or life-threatening deterioration.   Critical care was time spent personally by me on the following activities: development of treatment plan with patient and/or surrogate as well as nursing, discussions with consultants, evaluation of patient's response to treatment, examination of patient, obtaining history from patient or surrogate, ordering and performing treatments and interventions, ordering and review of laboratory studies, ordering and review of radiographic studies, pulse oximetry and re-evaluation of patient's condition.    Ottie Glazier, M.D.  Pulmonary & Critical Care Medicine

## 2021-01-21 LAB — BLOOD GAS, ARTERIAL
Acid-base deficit: 7.1 mmol/L — ABNORMAL HIGH (ref 0.0–2.0)
Bicarbonate: 18.7 mmol/L — ABNORMAL LOW (ref 20.0–28.0)
Delivery systems: POSITIVE
Expiratory PAP: 5
FIO2: 1
Inspiratory PAP: 10
O2 Saturation: 94.3 %
Patient temperature: 37
pCO2 arterial: 38 mmHg (ref 32.0–48.0)
pH, Arterial: 7.3 — ABNORMAL LOW (ref 7.350–7.450)
pO2, Arterial: 80 mmHg — ABNORMAL LOW (ref 83.0–108.0)

## 2021-01-21 LAB — CBC WITH DIFFERENTIAL/PLATELET
Abs Immature Granulocytes: 0.09 10*3/uL — ABNORMAL HIGH (ref 0.00–0.07)
Basophils Absolute: 0 10*3/uL (ref 0.0–0.1)
Basophils Relative: 0 %
Eosinophils Absolute: 0 10*3/uL (ref 0.0–0.5)
Eosinophils Relative: 0 %
HCT: 42.7 % (ref 36.0–46.0)
Hemoglobin: 14.5 g/dL (ref 12.0–15.0)
Immature Granulocytes: 1 %
Lymphocytes Relative: 4 %
Lymphs Abs: 0.5 10*3/uL — ABNORMAL LOW (ref 0.7–4.0)
MCH: 32 pg (ref 26.0–34.0)
MCHC: 34 g/dL (ref 30.0–36.0)
MCV: 94.3 fL (ref 80.0–100.0)
Monocytes Absolute: 0.3 10*3/uL (ref 0.1–1.0)
Monocytes Relative: 3 %
Neutro Abs: 9.7 10*3/uL — ABNORMAL HIGH (ref 1.7–7.7)
Neutrophils Relative %: 92 %
Platelets: 140 10*3/uL — ABNORMAL LOW (ref 150–400)
RBC: 4.53 MIL/uL (ref 3.87–5.11)
RDW: 14.5 % (ref 11.5–15.5)
WBC: 10.5 10*3/uL (ref 4.0–10.5)
nRBC: 0 % (ref 0.0–0.2)

## 2021-01-21 LAB — COMPREHENSIVE METABOLIC PANEL
ALT: 22 U/L (ref 0–44)
AST: 47 U/L — ABNORMAL HIGH (ref 15–41)
Albumin: 2.5 g/dL — ABNORMAL LOW (ref 3.5–5.0)
Alkaline Phosphatase: 65 U/L (ref 38–126)
Anion gap: 8 (ref 5–15)
BUN: 49 mg/dL — ABNORMAL HIGH (ref 8–23)
CO2: 18 mmol/L — ABNORMAL LOW (ref 22–32)
Calcium: 8.3 mg/dL — ABNORMAL LOW (ref 8.9–10.3)
Chloride: 116 mmol/L — ABNORMAL HIGH (ref 98–111)
Creatinine, Ser: 0.97 mg/dL (ref 0.44–1.00)
GFR, Estimated: 59 mL/min — ABNORMAL LOW (ref >60)
Glucose, Bld: 110 mg/dL — ABNORMAL HIGH (ref 70–99)
Potassium: 4.5 mmol/L (ref 3.5–5.1)
Sodium: 142 mmol/L (ref 135–145)
Total Bilirubin: 1 mg/dL (ref 0.3–1.2)
Total Protein: 5.9 g/dL — ABNORMAL LOW (ref 6.5–8.1)

## 2021-01-21 LAB — D-DIMER, QUANTITATIVE: D-Dimer, Quant: 1.56 ug/mL-FEU — ABNORMAL HIGH (ref 0.00–0.50)

## 2021-01-21 LAB — C-REACTIVE PROTEIN: CRP: 12.1 mg/dL — ABNORMAL HIGH (ref <1.0)

## 2021-01-21 LAB — PHOSPHORUS: Phosphorus: 2.8 mg/dL (ref 2.5–4.6)

## 2021-01-21 LAB — FERRITIN: Ferritin: 930 ng/mL — ABNORMAL HIGH (ref 11–307)

## 2021-01-21 LAB — MAGNESIUM: Magnesium: 1.9 mg/dL (ref 1.7–2.4)

## 2021-01-21 MED ORDER — METOPROLOL TARTRATE 5 MG/5ML IV SOLN: 2.5000 mg | INTRAVENOUS | Status: DC | PRN

## 2021-01-21 MED ORDER — ADULT MULTIVITAMIN W/MINERALS CH: 1.0000 | ORAL_TABLET | Freq: Every day | ORAL | Status: DC

## 2021-01-21 MED ORDER — MIDAZOLAM HCL 2 MG/2ML IJ SOLN
INTRAMUSCULAR | Status: AC
  Filled 2021-01-21: qty 2

## 2021-01-21 MED ORDER — ENSURE ENLIVE PO LIQD
237.0000 mL | Freq: Three times a day (TID) | ORAL | Status: DC
  Administered 2021-01-21: 237 mL via ORAL

## 2021-01-21 MED ORDER — MORPHINE SULFATE (PF) 2 MG/ML IV SOLN
1.0000 mg | INTRAVENOUS | Status: DC | PRN
Start: 2021-01-21 — End: 2021-01-22
  Administered 2021-01-21 – 2021-01-22 (×2): 2 mg via INTRAVENOUS
  Filled 2021-01-21 (×2): qty 1

## 2021-01-21 MED ORDER — SODIUM CHLORIDE 0.9% FLUSH
10.0000 mL | Freq: Two times a day (BID) | INTRAVENOUS | Status: DC
  Administered 2021-01-21 – 2021-01-23 (×4): 10 mL

## 2021-01-21 MED ORDER — DEXMEDETOMIDINE HCL IN NACL 400 MCG/100ML IV SOLN
0.4000 ug/kg/h | INTRAVENOUS | Status: DC
  Administered 2021-01-21: 0.4 ug/kg/h via INTRAVENOUS
  Administered 2021-01-21: 1.02 ug/kg/h via INTRAVENOUS
  Administered 2021-01-22: 0.7 ug/kg/h via INTRAVENOUS
  Filled 2021-01-21 (×4): qty 100

## 2021-01-21 MED ORDER — SODIUM CHLORIDE 0.9% FLUSH: 10.0000 mL | INTRAVENOUS | Status: DC | PRN

## 2021-01-21 MED ORDER — TRAZODONE HCL 100 MG PO TABS: 100.0000 mg | ORAL_TABLET | Freq: Every day | ORAL | Status: DC

## 2021-01-21 MED ORDER — MIDAZOLAM HCL 2 MG/2ML IJ SOLN: 1.0000 mg | Freq: Once | INTRAMUSCULAR | Status: DC

## 2021-01-21 MED ORDER — FUROSEMIDE 10 MG/ML IJ SOLN
40.0000 mg | Freq: Every day | INTRAMUSCULAR | Status: DC
  Administered 2021-01-21: 40 mg via INTRAVENOUS
  Filled 2021-01-21: qty 4

## 2021-01-21 MED ORDER — SODIUM CHLORIDE 0.9 % IV SOLN
250.0000 mL | INTRAVENOUS | Status: DC
  Administered 2021-01-21: 250 mL via INTRAVENOUS

## 2021-01-21 MED ORDER — FUROSEMIDE 10 MG/ML IJ SOLN: 40.0000 mg | Freq: Every day | INTRAMUSCULAR | Status: DC

## 2021-01-21 MED ORDER — ALPRAZOLAM 0.5 MG PO TABS
0.5000 mg | ORAL_TABLET | Freq: Three times a day (TID) | ORAL | Status: DC | PRN
  Administered 2021-01-21: 0.5 mg via ORAL
  Filled 2021-01-21: qty 1

## 2021-01-21 MED ORDER — METHYLPREDNISOLONE SODIUM SUCC 125 MG IJ SOLR
0.5000 mg/kg | Freq: Two times a day (BID) | INTRAMUSCULAR | Status: DC
  Administered 2021-01-21 – 2021-01-22 (×2): 41.875 mg via INTRAVENOUS
  Filled 2021-01-21 (×2): qty 2

## 2021-01-21 MED ORDER — SODIUM CHLORIDE 0.9 % IV SOLN
100.0000 mg | Freq: Two times a day (BID) | INTRAVENOUS | Status: DC
  Administered 2021-01-21 – 2021-01-22 (×3): 100 mg via INTRAVENOUS
  Filled 2021-01-21 (×5): qty 100

## 2021-01-21 MED ORDER — APIXABAN 5 MG PO TABS
5.0000 mg | ORAL_TABLET | Freq: Two times a day (BID) | ORAL | Status: DC
  Administered 2021-01-21: 5 mg via ORAL
  Filled 2021-01-21: qty 1

## 2021-01-21 MED ORDER — MELATONIN 5 MG PO TABS: 5.0000 mg | ORAL_TABLET | Freq: Every day | ORAL | Status: DC

## 2021-01-21 MED ORDER — MIDODRINE HCL 5 MG PO TABS: 10.0000 mg | ORAL_TABLET | Freq: Three times a day (TID) | ORAL | Status: DC

## 2021-01-21 MED ORDER — NOREPINEPHRINE 4 MG/250ML-% IV SOLN
2.0000 ug/min | INTRAVENOUS | Status: DC
  Administered 2021-01-22: 2 ug/min via INTRAVENOUS
  Filled 2021-01-21: qty 250

## 2021-01-21 MED ORDER — ALBUTEROL SULFATE (2.5 MG/3ML) 0.083% IN NEBU
2.5000 mg | INHALATION_SOLUTION | Freq: Four times a day (QID) | RESPIRATORY_TRACT | Status: DC
  Administered 2021-01-21 – 2021-01-25 (×14): 2.5 mg via RESPIRATORY_TRACT
  Filled 2021-01-21 (×14): qty 3

## 2021-01-21 MED ORDER — DIGOXIN 125 MCG PO TABS
0.1250 mg | ORAL_TABLET | Freq: Every day | ORAL | Status: DC
Start: 2021-01-22 — End: 2021-01-22
  Filled 2021-01-21: qty 1

## 2021-01-21 MED ORDER — PREDNISONE 10 MG PO TABS: 50.0000 mg | ORAL_TABLET | Freq: Every day | ORAL | Status: DC

## 2021-01-21 MED ORDER — METOPROLOL TARTRATE 5 MG/5ML IV SOLN
2.5000 mg | INTRAVENOUS | Status: DC | PRN
  Administered 2021-01-21 (×2): 2.5 mg via INTRAVENOUS
  Filled 2021-01-21 (×2): qty 5

## 2021-01-21 MED ORDER — DIGOXIN 0.25 MG/ML IJ SOLN
0.5000 mg | Freq: Once | INTRAMUSCULAR | Status: AC
  Administered 2021-01-21: 0.5 mg via INTRAVENOUS
  Filled 2021-01-21: qty 2

## 2021-01-21 NOTE — Progress Notes (Signed)
CRITICAL CARE PROGRESS NOTE    Name: [name] MRN: [id number] DOB: [birth year]     LOS: 3   SUBJECTIVE FINDINGS & SIGNIFICANT EVENTS    Patient description:  80 y.o with significant PMH of chronic atrial fibrillation on Eliquis, sick sinus syndrome s/p pacemaker, HTN, HLD, CKD OSA not on CPAP, hypothyroidism and bilateral chronic hip pain who presented to the ED with chief complaints of progressive shortness of breath.   Patient developed fevers, chills, generalized weakness, cough, lethargy, and dyspnea several days before presenting to the ED. Patient and her husband took a home COVID test on 01/10/21 which came back positive. Per daughter, her primary medical doctor started her on Paxlovid, Azithromycin and Prednisone x 5 days with no improvement. Patient continued to have progressive dyspnea, refused po intake and meds due to lack of appetite and lethargy. Patient's symptoms worsened today prompting her daughter to call EMS. On EMS arrival, patient was found to be hypoxic with sats in the 50's on R.A. She was placed on 15L/min via NRB and transported to the ED.  Patient was placed on BiPAP due to significant dyspnea with isolation precautions for COVID-19. She was started on Remdesivir and steroids. PCCM consulted for admission and further management.  01/20/21- patient s/p CTPE no PE but does show bilateral GGO infiltrates consistent with COVID19 01/21/21- patient with severe anxiety, she has been bedridden since admission with atelectasis. Has not slept in 2 days she states. I spoke with daughter. PT/OT and OOB to chair today. Shes higher risk for falls ordered 1:1.  We started BIPAP today with precedex. Updated daughter in person.   Lines/tubes : External Urinary Catheter (Active)  Collection Container  Dedicated Suction Canister 01/20/21 0800  Suction (Verified suction is between 40-80 mmHg) Yes 01/20/21 0800  Securement Method Leg strap 01/19/21 0700  Site Assessment Clean;Intact 01/20/21 0800  Intervention Female External Urinary Catheter Replaced 01/20/21 0800  Output (mL) 150 mL 01/20/21 1218    Microbiology/Sepsis markers: Results for orders placed or performed during the hospital encounter of 01/02/2021  Blood culture (single)     Status: None (Preliminary result)   Collection Time: 01/17/2021  7:26 PM   Specimen: BLOOD  Result Value Ref Range Status   Specimen Description BLOOD RIGHT ANTECUBITAL  Final   Special Requests   Final    BOTTLES DRAWN AEROBIC AND ANAEROBIC Blood Culture adequate volume   Culture   Final    NO GROWTH 3 DAYS Performed at [facility], [address], [place], [place] [address]    Report Status PENDING  Incomplete  Resp Panel by RT-PCR (Flu A&B, Covid) Nasopharyngeal Swab     Status: Abnormal   Collection Time: 12/30/2020  8:52 PM   Specimen: Nasopharyngeal Swab; Nasopharyngeal(NP) swabs in vial transport medium  Result Value Ref Range Status   SARS Coronavirus 2 by RT PCR POSITIVE (A) NEGATIVE Final    Comment: RESULT CALLED TO, READ BACK BY AND VERIFIED WITHToni Arthurs RN 2252 01/22/2021 HNM (NOTE) SARS-CoV-2 target nucleic acids are DETECTED.  The SARS-CoV-2 RNA is generally detectable in upper respiratory specimens during the acute phase of infection. Positive results are indicative of the presence of the identified virus, but do not rule out bacterial infection or co-infection with other pathogens not detected by the test. Clinical correlation with patient history and other diagnostic information is necessary to determine patient infection status. The expected result is Negative.  Fact Sheet for Patients: EntrepreneurPulse.com.au  Fact Sheet for Healthcare  Providers: IncredibleEmployment.be  This test is not yet approved or cleared by the [place] and  has been authorized for detection and/or diagnosis of SARS-CoV-2 by FDA under an Emergency Use Authorization (EUA).  This EUA will remain in effect (meaning this test can b e used) for the duration of  the COVID-19 declaration under Section 564(b)(1) of the Act, 21 U.S.C. section 360bbb-3(b)(1), unless the authorization is terminated or revoked sooner.     Influenza A by PCR NEGATIVE NEGATIVE Final   Influenza B by PCR NEGATIVE NEGATIVE Final    Comment: (NOTE) The Xpert Xpress SARS-CoV-2/FLU/RSV plus assay is intended as an aid in the diagnosis of influenza from Nasopharyngeal swab specimens and should not be used as a sole basis for treatment. Nasal washings and aspirates are unacceptable for Xpert Xpress SARS-CoV-2/FLU/RSV testing.  Fact Sheet for Patients: EntrepreneurPulse.com.au  Fact Sheet for Healthcare Providers: IncredibleEmployment.be  This test is not yet approved or cleared by the [place] FDA and has been authorized for detection and/or diagnosis of SARS-CoV-2 by FDA under an Emergency Use Authorization (EUA). This EUA will remain in effect (meaning this test can be used) for the duration of the COVID-19 declaration under Section 564(b)(1) of the Act, 21 U.S.C. section 360bbb-3(b)(1), unless the authorization is terminated or revoked.  Performed at [facility], [address] [place], [place] [address]   MRSA Next Gen by PCR, Nasal     Status: Abnormal   Collection Time: 01/08/2021 11:14 PM   Specimen: Nasal Mucosa; Nasal Swab  Result Value Ref Range Status   MRSA by PCR Next Gen DETECTED (A) NOT DETECTED Final    Comment: RESULT CALLED TO, READ BACK BY AND VERIFIED WITH: JUSTIN ODELL @0139  ON 01/19/21 SKL (NOTE) The GeneXpert MRSA Assay (FDA approved for NASAL specimens only), is one  component of a comprehensive MRSA colonization surveillance program. It is not intended to diagnose MRSA infection nor to guide or monitor treatment for MRSA infections. Test performance is not FDA approved in patients less than 29 years old. Performed at [facility], [address] [place], [place] [address]   Culture, blood (routine x 2)     Status: None (Preliminary result)   Collection Time: 01/13/2021 11:51 PM   Specimen: BLOOD LEFT FOREARM  Result Value Ref Range Status   Specimen Description BLOOD LEFT FOREARM  Final   Special Requests   Final    BOTTLES DRAWN AEROBIC AND ANAEROBIC Blood Culture adequate volume   Culture   Final    NO GROWTH 2 DAYS Performed at [facility], [address], [place], [place] [address]    Report Status PENDING  Incomplete    Anti-infectives:  Anti-infectives (From admission, onward)    Start     Dose/Rate Route Frequency Ordered Stop   01/19/21 1315  hydroxychloroquine (PLAQUENIL) tablet 200 mg        200 mg Oral 2 times daily 01/19/21 1215     01/19/21 1000  remdesivir 100 mg in sodium chloride 0.9 % 100 mL IVPB       See Hyperspace for full Linked Orders Report.   100 mg 200 mL/hr over 30 Minutes Intravenous Daily 01/21/2021 2202 01/23/21 0959   01/19/21 0200  cefTRIAXone (ROCEPHIN) 1 g in sodium chloride 0.9 % 100 mL IVPB        1 g 200 mL/hr over 30 Minutes Intravenous Every 24 hours 01/19/21 0100     01/19/21 0200  azithromycin (ZITHROMAX) 500  mg in sodium chloride 0.9 % 250 mL IVPB        500 mg 250 mL/hr over 60 Minutes Intravenous Every 24 hours 01/19/21 0100     01/04/2021 2215  remdesivir 200 mg in sodium chloride 0.9% 250 mL IVPB       See Hyperspace for full Linked Orders Report.   200 mg 580 mL/hr over 30 Minutes Intravenous Once 01/16/2021 2202 01/19/21 0700        PAST MEDICAL HISTORY   Past Medical History:  Diagnosis Date   Anxiety    Aortic insufficiency    Trivial   Arthritis    Asthma     Bronchitis    Bursitis, hip    Claustrophobia    Claustrophobia    Dysrhythmia    a fib   GERD (gastroesophageal reflux disease)    history of   Gout    Hyperlipidemia    Hypertension    Hypothyroidism    Mitral regurgitation    Obesity    Panic attacks    Paroxysmal atrial fibrillation (HCC)    PTSD (post-traumatic stress disorder)    Rhinitis    Sick sinus syndrome ([place])    Sleep apnea    was ordered CPAP, but never fitted for one years ago, Dr has retired     SURGICAL HISTORY   Past Surgical History:  Procedure Laterality Date   [place] N/A 08/03/2015   Procedure: [place];  Surgeon: Alden Hipp, MD;  Location: [facility];  Service: Gynecology;  Laterality: N/A;  Patient has an ICD    INSERT / REPLACE / REMOVE PACEMAKER     PACEMAKER INSERTION     Status post insertion   PPM GENERATOR CHANGEOUT N/A 05/02/2019   Procedure: PPM GENERATOR CHANGEOUT;  Surgeon: Deboraha Sprang, MD;  Location: [facility];  Service: Cardiovascular;  Laterality: N/A;   ROTATOR CUFF REPAIR     left   Status post child birth x1     TONSILLECTOMY     TOTAL HIP ARTHROPLASTY Left 12/22/2015   Procedure: TOTAL HIP ARTHROPLASTY;  Surgeon: Dereck Leep, MD;  Location: [facility];  Service: Orthopedics;  Laterality: Left;   TOTAL HIP REVISION Left 03/01/2018   Procedure: TOTAL HIP REVISION;  Surgeon: Dereck Leep, MD;  Location: [facility];  Service: Orthopedics;  Laterality: Left;     FAMILY HISTORY   Family History  Problem Relation Age of Onset   Stroke Mother    Heart disease Mother    Alzheimer's disease Father    Hypertension Sister    Cancer Brother    Arthritis Sister    Cancer Maternal Grandfather      SOCIAL HISTORY   Social History   Tobacco Use   Smoking status: Never   Smokeless tobacco: Never  Vaping Use   Vaping Use: Never used  Substance Use Topics    Alcohol use: No    Alcohol/week: 0.0 standard drinks   Drug use: No     MEDICATIONS   Current Medication:  Current Facility-Administered Medications:    albuterol (VENTOLIN HFA) 108 (90 Base) MCG/ACT inhaler 2 puff, 2 puff, Inhalation, Q6H, [place], Bing Neighbors, NP, 2 puff at 01/20/21 2226   ascorbic acid (VITAMIN C) tablet 500 mg, 500 mg, Oral, Daily, [place], Bing Neighbors, NP, 500 mg at 01/21/21 0758   azithromycin (ZITHROMAX) 500 mg in sodium chloride 0.9 % 250 mL  IVPB, 500 mg, Intravenous, Q24H, [place], Bing Neighbors, NP, Last Rate: 250 mL/hr at 01/21/21 0217, 500 mg at 01/21/21 0217   cefTRIAXone (ROCEPHIN) 1 g in sodium chloride 0.9 % 100 mL IVPB, 1 g, Intravenous, Q24H, [place], Bing Neighbors, NP, Last Rate: 200 mL/hr at 01/21/21 0139, 1 g at 01/21/21 0139   Chlorhexidine Gluconate Cloth 2 % PADS 6 each, 6 each, Topical, Daily, Ottie Glazier, MD, 6 each at 01/20/21 1023   chlorpheniramine-HYDROcodone (TUSSIONEX) 10-8 MG/5ML suspension 5 mL, 5 mL, Oral, Q12H PRN, Lang Snow, NP   cholecalciferol (VITAMIN D3) tablet 2,000 Units, 2,000 Units, Oral, Daily, Lorna Dibble, RPH, 2,000 Units at 01/21/21 0757   docusate sodium (COLACE) capsule 100 mg, 100 mg, Oral, BID PRN, Lang Snow, NP, 100 mg at 01/20/21 1704   guaiFENesin-dextromethorphan (ROBITUSSIN DM) 100-10 MG/5ML syrup 10 mL, 10 mL, Oral, Q4H PRN, [place], Bing Neighbors, NP   heparin injection 5,000 Units, 5,000 Units, Subcutaneous, Q8H, [place], Bing Neighbors, NP, 5,000 Units at 01/21/21 8242   hydroxychloroquine (PLAQUENIL) tablet 200 mg, 200 mg, Oral, BID, Beers, Shanon Brow, RPH, 200 mg at 01/21/21 0759   metoprolol tartrate (LOPRESSOR) injection 2.5 mg, 2.5 mg, Intravenous, Q1H PRN, Ottie Glazier, MD   oxyCODONE (Oxy IR/ROXICODONE) immediate release tablet 5 mg, 5 mg, Oral, Q6H PRN, Beers, Shanon Brow, RPH, 5 mg at 01/19/21 1255   polyethylene glycol (MIRALAX / GLYCOLAX) packet 17 g, 17 g,  Oral, Daily PRN, Lang Snow, NP, 17 g at 01/20/21 1304   pregabalin (LYRICA) capsule 25 mg, 25 mg, Oral, BID, Beers, Shanon Brow, RPH, 25 mg at 01/21/21 0758   [COMPLETED] remdesivir 200 mg in sodium chloride 0.9% 250 mL IVPB, 200 mg, Intravenous, Once, Stopping Infusion hung by another clincian at 01/19/21 0700 **FOLLOWED BY** remdesivir 100 mg in sodium chloride 0.9 % 100 mL IVPB, 100 mg, Intravenous, Daily, Benita Gutter, RPH, Last Rate: 200 mL/hr at 01/21/21 0903, 100 mg at 01/21/21 3536   zinc sulfate capsule 220 mg, 220 mg, Oral, Daily, Lang Snow, NP, 220 mg at 01/21/21 0805    ALLERGIES   Contrast media [iodinated diagnostic agents]    REVIEW OF SYSTEMS     10 point ROS negative except asper subj findings  PHYSICAL EXAMINATION   Vital Signs: Temp:  [97.6 F (36.4 C)-98 F (36.7 C)] 97.6 F (36.4 C) (11/25 0435) Pulse Rate:  [38-128] 118 (11/25 0700) Resp:  [18-37] 27 (11/25 0700) BP: (79-127)/(38-111) 97/69 (11/25 0700) SpO2:  [76 %-100 %] 88 % (11/25 0700)  GENERAL:mild distress due to COVID19 and severe anxiety HEAD: Normocephalic, atraumatic.  EYES: Pupils equal, round, reactive to light.  No scleral icterus.  MOUTH: Moist mucosal membrane. NECK: Supple. No thyromegaly. No nodules. No JVD.  PULMONARY: mild ronchi CARDIOVASCULAR: S1 and S2. Regular rate and rhythm. No murmurs, rubs, or gallops.  GASTROINTESTINAL: Soft, nontender, non-distended. No masses. Positive bowel sounds. No hepatosplenomegaly.  MUSCULOSKELETAL: No swelling, clubbing, or edema.  NEUROLOGIC: Mild distress due to acute illness SKIN:intact,warm,dry   PERTINENT DATA     Infusions:  azithromycin 500 mg (01/21/21 0217)   cefTRIAXone (ROCEPHIN)  IV 1 g (01/21/21 0139)   remdesivir 100 mg in NS 100 mL 100 mg (01/21/21 0903)   Scheduled Medications:  albuterol  2 puff Inhalation Q6H   vitamin C  500 mg Oral Daily   Chlorhexidine Gluconate Cloth  6 each Topical  Daily   cholecalciferol  2,000 Units Oral Daily   heparin  5,000 Units Subcutaneous Q8H   hydroxychloroquine  200 mg Oral BID   pregabalin  25 mg Oral BID   zinc sulfate  220 mg Oral Daily   PRN Medications: chlorpheniramine-HYDROcodone, docusate sodium, guaiFENesin-dextromethorphan, metoprolol tartrate, oxyCODONE, polyethylene glycol Hemodynamic parameters:   Intake/Output: 11/24 0701 - 11/25 0700 In: 160 [P.O.:60; IV Piggyback:100] Out: 500 [Urine:500]  Ventilator  Settings:     LAB RESULTS:  Basic Metabolic Panel: Recent Labs  Lab 01/02/2021 1926 01/19/21 0313 01/20/21 0323 01/21/21 0547  NA 137 140 142 142  K 4.0 4.4 4.2 4.5  CL 112* 113* 112* 116*  CO2 18* 20* 23 18*  GLUCOSE 100* 134* 154* 110*  BUN 46* 47* 54* 49*  CREATININE 1.19* 1.16* 1.00 0.97  CALCIUM 7.8* 8.0* 8.6* 8.3*  MG  --  1.6* 2.2 1.9  PHOS  --  3.4 3.2 2.8    Liver Function Tests: Recent Labs  Lab 01/21/2021 1926 01/19/21 0313 01/20/21 0323 01/21/21 0547  AST 33 36 37 47*  ALT 21 21 23 22   ALKPHOS 52 52 60 65  BILITOT 1.2 0.9 0.9 1.0  PROT 6.0* 5.9* 6.6 5.9*  ALBUMIN 2.5* 2.4* 2.7* 2.5*    No results for input(s): LIPASE, AMYLASE in the last 168 hours. No results for input(s): AMMONIA in the last 168 hours. CBC: Recent Labs  Lab 01/10/2021 1926 01/17/2021 2351 01/19/21 0313 01/20/21 0323 01/21/21 0547  WBC 6.7 6.7 4.2 9.0 10.5  NEUTROABS 5.8  --  3.7 7.9* 9.7*  HGB 13.6 13.6 13.2 14.0 14.5  HCT 40.4 40.3 39.0 41.4 42.7  MCV 94.6 93.9 94.7 94.5 94.3  PLT 146* 144* 142* 164 140*    Cardiac Enzymes: No results for input(s): CKTOTAL, CKMB, CKMBINDEX, TROPONINI in the last 168 hours. BNP: Invalid input(s): POCBNP CBG: Recent Labs  Lab 01/13/2021 2323  GLUCAP 5        IMAGING RESULTS:  Imaging: CT Angio Chest Pulmonary Embolism (PE) W or WO Contrast  Result Date: 01/20/2021 CLINICAL DATA:  Chest pain and shortness of breath with abdominal distension EXAM: CT  ANGIOGRAPHY CHEST CT ABDOMEN AND PELVIS WITH CONTRAST TECHNIQUE: Multidetector CT imaging of the chest was performed using the standard protocol during bolus administration of intravenous contrast. Multiplanar CT image reconstructions and MIPs were obtained to evaluate the vascular anatomy. Multidetector CT imaging of the abdomen and pelvis was performed using the standard protocol during bolus administration of intravenous contrast. CONTRAST:  127mL OMNIPAQUE IOHEXOL 350 MG/ML SOLN COMPARISON:  01/17/2021 FINDINGS: CTA CHEST FINDINGS Cardiovascular: Atherosclerotic calcifications of the thoracic aorta are noted. No aneurysmal dilatation or dissection is noted. No cardiac enlargement is seen. Coronary calcifications are noted. Pulmonary artery shows a normal branching pattern without evidence of filling defect to suggest pulmonary embolism. Mediastinum/Nodes: Thoracic inlet is within normal limits. No sizable hilar or mediastinal adenopathy is noted. The esophagus as visualized is within normal limits. Lungs/Pleura: Lungs are well aerated bilaterally. Patchy airspace opacity is noted throughout both lung similar to that seen on prior chest x-ray consistent with multifocal pneumonia. These changes may represent sequelae from the prior COVID infection. No sizable effusion is noted. Musculoskeletal: Degenerative changes of the thoracic spine are noted. No acute rib abnormality is noted. Review of the MIP images confirms the above findings. CT ABDOMEN and PELVIS FINDINGS Hepatobiliary: No focal liver abnormality is seen. No gallstones, gallbladder wall thickening, or biliary dilatation. Pancreas: Unremarkable. No pancreatic ductal dilatation or surrounding inflammatory changes. Spleen: Normal in size without focal abnormality.  Adrenals/Urinary Tract: Adrenal glands are within normal limits. Kidneys demonstrate a normal enhancement pattern bilaterally. Small 1 cm cyst is noted within the left kidney. No obstructive  changes are seen. The bladder is well distended. Stomach/Bowel: Fecal material is noted within the rectal vault consistent with a degree of constipation. No obstructive changes are seen. The appendix is not well visualized. No inflammatory changes to suggest appendicitis are noted. Small bowel and stomach are unremarkable. Vascular/Lymphatic: Aortic atherosclerosis. No enlarged abdominal or pelvic lymph nodes. Reproductive: Uterus and bilateral adnexa are unremarkable. Other: No abdominal wall hernia or abnormality. No abdominopelvic ascites. Musculoskeletal: Left hip prosthesis is noted. Degenerative changes of lumbar spine are noted. Review of the MIP images confirms the above findings. IMPRESSION: CTA of the chest: No acute pulmonary embolism is noted. Patchy airspace opacities are noted similar to that seen on recent chest x-ray which may be of sequelae from the recent COVID infection. No sizable effusion is seen. CT of the abdomen and pelvis: Prominent fecal material within the rectal vault which may represent a degree of impaction. No acute abdominal abnormality is seen. Aortic Atherosclerosis (ICD10-I70.0). Electronically Signed   By: Inez Catalina M.D.   On: 01/20/2021 00:28   CT ABDOMEN PELVIS W CONTRAST  Result Date: 01/20/2021 CLINICAL DATA:  Chest pain and shortness of breath with abdominal distension EXAM: CT ANGIOGRAPHY CHEST CT ABDOMEN AND PELVIS WITH CONTRAST TECHNIQUE: Multidetector CT imaging of the chest was performed using the standard protocol during bolus administration of intravenous contrast. Multiplanar CT image reconstructions and MIPs were obtained to evaluate the vascular anatomy. Multidetector CT imaging of the abdomen and pelvis was performed using the standard protocol during bolus administration of intravenous contrast. CONTRAST:  176mL OMNIPAQUE IOHEXOL 350 MG/ML SOLN COMPARISON:  01/06/2021 FINDINGS: CTA CHEST FINDINGS Cardiovascular: Atherosclerotic calcifications of the  thoracic aorta are noted. No aneurysmal dilatation or dissection is noted. No cardiac enlargement is seen. Coronary calcifications are noted. Pulmonary artery shows a normal branching pattern without evidence of filling defect to suggest pulmonary embolism. Mediastinum/Nodes: Thoracic inlet is within normal limits. No sizable hilar or mediastinal adenopathy is noted. The esophagus as visualized is within normal limits. Lungs/Pleura: Lungs are well aerated bilaterally. Patchy airspace opacity is noted throughout both lung similar to that seen on prior chest x-ray consistent with multifocal pneumonia. These changes may represent sequelae from the prior COVID infection. No sizable effusion is noted. Musculoskeletal: Degenerative changes of the thoracic spine are noted. No acute rib abnormality is noted. Review of the MIP images confirms the above findings. CT ABDOMEN and PELVIS FINDINGS Hepatobiliary: No focal liver abnormality is seen. No gallstones, gallbladder wall thickening, or biliary dilatation. Pancreas: Unremarkable. No pancreatic ductal dilatation or surrounding inflammatory changes. Spleen: Normal in size without focal abnormality. Adrenals/Urinary Tract: Adrenal glands are within normal limits. Kidneys demonstrate a normal enhancement pattern bilaterally. Small 1 cm cyst is noted within the left kidney. No obstructive changes are seen. The bladder is well distended. Stomach/Bowel: Fecal material is noted within the rectal vault consistent with a degree of constipation. No obstructive changes are seen. The appendix is not well visualized. No inflammatory changes to suggest appendicitis are noted. Small bowel and stomach are unremarkable. Vascular/Lymphatic: Aortic atherosclerosis. No enlarged abdominal or pelvic lymph nodes. Reproductive: Uterus and bilateral adnexa are unremarkable. Other: No abdominal wall hernia or abnormality. No abdominopelvic ascites. Musculoskeletal: Left hip prosthesis is noted.  Degenerative changes of lumbar spine are noted. Review of the MIP images confirms the above findings. IMPRESSION:  CTA of the chest: No acute pulmonary embolism is noted. Patchy airspace opacities are noted similar to that seen on recent chest x-ray which may be of sequelae from the recent COVID infection. No sizable effusion is seen. CT of the abdomen and pelvis: Prominent fecal material within the rectal vault which may represent a degree of impaction. No acute abdominal abnormality is seen. Aortic Atherosclerosis (ICD10-I70.0). Electronically Signed   By: Inez Catalina M.D.   On: 01/20/2021 00:28   @PROBHOSP @ No results found.       ASSESSMENT AND PLAN   Acute Hypoxic Respiratory Failure secondary to COVID-19 Pneumonia  Tested + 11/14 treated with Paxlovid, Prednisone and Azithromycin x 5 days -Place on HHFNC, wean FiO2 as tolerated -BiPAP, wean as tolerated -Supplemental O2 as needed to maintain O2 saturations 88 to 92% -Follow intermittent ABG and chest x-ray as needed -Ensure adequate pulmonary hygiene  -F/u cultures, trend PCT, wbc, monitor fever curve -As needed bronchodilators -Start Empiric abx coverage given risk factors (Asthma, Bronchitis ) with Ceftriaxone + Azithromycin pending cultures, consider de-escalation if appropriate -Start Remdesivir x5 days  -Continue steroids. -Encourage OOB, IS, FV, and awake proning if able -Continue airborne, contact precautions for 21 days from positive testing. -Daily CRP, CMP, CBC, D-dimer -Heparin prophylactic dose.     Partial SBO- RESOLVED    - CT abd with dialated loops and Fecal material is noted within the rectal vault consistent with a degree of constipation - PATIENT HAD 2BMS - 01/21/21  AKI on CKD Stage III BUN/Cr.: abnormal -Monitor I&O's / urinary output -Follow BMP -Ensure adequate renal perfusion -Avoid nephrotoxic agents as able -Replace electrolytes as indicated   Chronic Atrial Fibrlilation Hx: SSS s/p  pacemaker Thromboembolic risk factors ( age  -2, HTN-1, Gender-1) for a CHADSVASc Score of >=4 -Eliquis >>Heparin>>eiquis -Metroprolol 2.5 -DIGOXIN 500MCG X 1 AND 125 DAILY    Hypertension HLD  -Continuous cardiac monitoring -Maintain MAP greater than 65 -Continue metoprolol as BP permits -Hold Spironolactone for now -Repeat 2D Echocardiogram   Chronic Pain Syndrome -Continue home meds oxy, Pregabalin once able to take po   Hypothyroidism -Check TSH -Continue Armour     Best practice:  Diet:  NPO Pain/Anxiety/Delirium protocol (if indicated): No VAP protocol (if indicated): Not indicated DVT prophylaxis: Subcutaneous Heparin GI prophylaxis: H2B Glucose control:  SSI No Central venous access:  N/A Arterial line:  N/A Foley:  N/A Mobility:  bed rest  PT consulted: N/A Daily multidisciplinary goals of care discussion  Code Status:  limited Disposition: ICU     = Goals of Care = Code Status Order: LIMITED CODE  Primary Emergency Contact: Wallace Ridge, Home Phone: [phone] Discussed code status with patient's Husband and Duaghter Tammy. They confirm that patient wishes to pursue full aggressive treatment and intervention options, including CPR/ACLS Drugs and Defibrillation EXCEPT intubation, goals of care will be addressed on going with family if that should become necessary.  Family would like to be immediately contacted in such situations.   Critical care provider statement:   Total critical care time: 33 minutes   Performed by: Lanney Gins MD   Critical care time was exclusive of separately billable procedures and treating other patients.   Critical care was necessary to treat or prevent imminent or life-threatening deterioration.   Critical care was time spent personally by me on the following activities: development of treatment plan with patient and/or surrogate as well as nursing, discussions with consultants, evaluation of patient's response to treatment,  examination of patient, obtaining  history from patient or surrogate, ordering and performing treatments and interventions, ordering and review of laboratory studies, ordering and review of radiographic studies, pulse oximetry and re-evaluation of patient's condition.    Ottie Glazier, M.D.  Pulmonary & Critical Care Medicine

## 2021-01-21 NOTE — Progress Notes (Signed)
L basilic midline placed per request. 20g X 1.88  PIV placed L/anterior forearm with ultrasound for potential pressor use.;site marked and instruction for iwatch given to nurse at bedside.

## 2021-01-21 NOTE — Evaluation (Signed)
Physical Therapy Evaluation Patient Details Name: [name] MRN: [id number] DOB: [birth year] Today's Date: 01/21/2021  History of Present Illness  presented to ER secondary to fever, chills, generalized weakness; admitted for management of acute hypoxic respiratory for COVID-19 PNA.  Clinical Impression  Patient resting in bed upon arrival to room; alert and oriented, eager for attempts at [place] (endorsing generalized discomfort in bed).  Nasal cannula and NRB in place at rest, mild SOB with conversation; slightly anxious due to respiratory status.  Generally weak and deconditioned throughout all extremities; no focal weakness appreciated.  Currently requiring mod assist for bed mobility; min assist for unsupported sitting edge of bed.  Tolerates sitting only 30-45 seconds; requiring return to supine due to onset of nausea, significant SOB with hypoxia (sats 71%) and global anxiety.  Did require extended time and repositioning in bed for recovery >85% on [place] and NRB.   Did transition to chair position in bed to optimize upright tolerance and promote pulmonary hygiene; patient tolerating well end of session.  will continue to progress towards OOB as appropriate in subsequent sessions   Of note, per discussion with respiratory end of session, planning for transition to [facility] later this date; will plan to reassess respiratory status and mobility next session once patient on different O2 support.  May additionally benefit from anxiolytics prior to mobility efforts if appropriate; will discuss with RN next session. Would benefit from skilled PT to address above deficits and promote optimal return to PLOF.; recommend transition to STR upon discharge from acute hospitalization.      Recommendations for follow up therapy are one component of a multi-disciplinary discharge planning process, led by the attending physician.  Recommendations may be updated based on patient status, additional functional criteria  and insurance authorization.  Follow Up Recommendations Skilled nursing-short term rehab (<3 hours/day)    Assistance Recommended at Discharge Frequent or constant Supervision/Assistance  Functional Status Assessment Patient has had a recent decline in their functional status and demonstrates the ability to make significant improvements in function in a reasonable and predictable amount of time.  Equipment Recommendations  Rolling walker (2 wheels);BSC/3in1    Recommendations for Other Services       Precautions / Restrictions Precautions Precautions: Fall Restrictions Weight Bearing Restrictions: No      Mobility  Bed Mobility Overal bed mobility: Needs Assistance Bed Mobility: Supine to Sit;Sit to Supine     Supine to sit: Mod assist Sit to supine: Mod assist   General bed mobility comments: additional assist provided for energy conservation    Transfers                   General transfer comment: unsafe/unable to tolerate due to extreme SOB with unsupported sitting    Ambulation/Gait               General Gait Details: unsafe/unable to tolerate due to extreme SOB with unsupported sitting  Stairs            Wheelchair Mobility    Modified Rankin (Stroke Patients Only)       Balance Overall balance assessment: Needs assistance Sitting-balance support: No upper extremity supported;Feet supported Sitting balance-Leahy Scale: Fair         Standing balance comment: unsafe/unable to tolerate due to extreme SOB with unsupported sitting                             Pertinent Vitals/Pain Pain  Assessment: No/denies pain    Home Living Family/patient expects to be discharged to:: Private residence Living Arrangements: Children Available Help at Discharge: Family Type of Home: House Home Access: Stairs to enter Entrance Stairs-Rails: Psychiatric nurse of Steps: 3   Home Layout: Multi-level;Able to live on  main level with bedroom/bathroom Home Equipment: Kasandra Knudsen - single point      Prior Function Prior Level of Function : Independent/Modified Independent             Mobility Comments: Indep for ADLs, household distances without assist device; intermittent use of SPC outside of the home.  Does endorse 3 falls in previous six months (going down stairs of home).  Intermittent O2 at times.  Caregiver for husband, taking responsibility for all household chores, driving, errands (minimal/no physical assist to husband)       Hand Dominance        Extremity/Trunk Assessment   Upper Extremity Assessment Upper Extremity Assessment: Generalized weakness    Lower Extremity Assessment Lower Extremity Assessment: Generalized weakness (grossly at least 4-/5 throughout)       Communication   Communication: No difficulties  Cognition Arousal/Alertness: Awake/alert Behavior During Therapy: WFL for tasks assessed/performed Overall Cognitive Status: Within Functional Limits for tasks assessed                                          General Comments      Exercises Other Exercises Other Exercises: Educated in role of PT and progressive mobility, pursed lip breathing and relaxation techniques; patient voiced understanding.  Constantly expressing desire to go home and "be back to a normal life"; provided with support and encouragement as appropriate. Other Exercises: Did transition to chair position in bed to optimize upright tolerance and promote pulmonary hygiene; patient tolerating well end of session.  will continue to progress towards OOB as appropriate in subsequent sessions   Assessment/Plan    PT Assessment Patient needs continued PT services  PT Problem List Decreased strength;Decreased activity tolerance;Decreased balance;Decreased mobility;Decreased knowledge of use of DME;Decreased safety awareness;Decreased knowledge of precautions;Cardiopulmonary status limiting  activity       PT Treatment Interventions DME instruction;Gait training;Stair training;Functional mobility training;Therapeutic activities;Therapeutic exercise;Balance training;Patient/family education    PT Goals (Current goals can be found in the Care Plan section)  Acute Rehab PT Goals Patient Stated Goal: to go home PT Goal Formulation: With patient Time For Goal Achievement: 02/04/21 Potential to Achieve Goals: Fair Additional Goals Additional Goal #1: Assess and establish goals for OOB and gait as appropriate.    Frequency Min 2X/week   Barriers to discharge Decreased caregiver support      Co-evaluation               AM-PAC PT "6 Clicks" Mobility  Outcome Measure Help needed turning from your back to your side while in a flat bed without using bedrails?: A Little Help needed moving from lying on your back to sitting on the side of a flat bed without using bedrails?: A Lot Help needed moving to and from a bed to a chair (including a wheelchair)?: Total Help needed standing up from a chair using your arms (e.g., wheelchair or bedside chair)?: Total Help needed to walk in hospital room?: Total Help needed climbing 3-5 steps with a railing? : Total 6 Click Score: 9    End of Session Equipment Utilized During Treatment: Oxygen  Activity Tolerance: Treatment limited secondary to medical complications (Comment) (significant SOB, hypoxia with minimal activity) Patient left: in bed;with call bell/phone within reach (chair position)   PT Visit Diagnosis: Muscle weakness (generalized) (M62.81);Difficulty in walking, not elsewhere classified (R26.2)    Time: 9024-0973 PT Time Calculation (min) (ACUTE ONLY): 31 min   Charges:   PT Evaluation $PT Eval High Complexity: 1 High PT Treatments $Therapeutic Activity: 8-22 mins      Milli Woolridge H. Owens Shark, PT, DPT, NCS 01/21/21, 1:30 PM [phone]

## 2021-01-21 NOTE — Consult Note (Signed)
PHARMACY CONSULT NOTE  Pharmacy Consult for Electrolyte Monitoring and Replacement   Recent Labs: Potassium (mmol/L)  Date Value  01/21/2021 4.5   Magnesium (mg/dL)  Date Value  01/21/2021 1.9   Calcium (mg/dL)  Date Value  01/21/2021 8.3 (L)   Albumin (g/dL)  Date Value  01/21/2021 2.5 (L)  03/07/2017 4.4   Phosphorus (mg/dL)  Date Value  01/21/2021 2.8   Sodium (mmol/L)  Date Value  01/21/2021 142  04/22/2019 140   Assessment: Patient is an 80 y/o F with medical history including hypothyroidism, HLD, obesity, HTN, sick sinus syndrome s/p pacemaker, GERD, Afib on Eliquis, CKD who is admitted with respiratory distress in setting of known COVID-19 infection diagnosed two weeks prior to presentation. Pharmacy consulted to assist with electrolyte monitoring and replacement as indicated.  Goal of Therapy:  Electrolytes within normal limits  Plan:  No replacement required.  --Follow-up electrolytes with AM labs tomorrow  Lorna Dibble 01/21/2021 10:28 AM

## 2021-01-21 NOTE — Progress Notes (Signed)
At bedside to place midline and patient is very agitated and trying to get out of bed you cannot redirect her. She is pulling away and we cannot hold still long enough to place a line even with assistant. It is unsafe to place midline at this time and no site found for PIV. Spoke Dr. Lanney Gins and he is aware of the situation. No further orders for more sedation at this time.

## 2021-01-21 NOTE — Evaluation (Signed)
Occupational Therapy Evaluation Patient Details Name: [name] MRN: [id number] DOB: 03-16-1940 Today's Date: 01/21/2021   History of Present Illness Pt is an 80 y/o F with PMH: Afib on Eliquis, SSS s/p PPM, HTN, HLD, CKD and OSA not on CPAP; who presented with fever, chills, and generalized weakness. Tested + at home for COVID on 11/14 and started on oral meds by PCP with no improvement. Adm for tx of acute hypoxic respiratory failure.   Clinical Impression   Pt seen for OT evaluation this date in setting of acute hospitalization d/t respiratory failure. Pt reports being INDEP at baseline including caring for her spouse. She presents this date with decreased fxl activity tolerance, weakness and general deconditioning. She currently requires: SETUP to MIN A with seated UB ADLs, MAX A for seated/bed level LB ADLs.  MOD A for rolling bed level. Not appropriate to attempt standing this date d/t being so deconditioned with spO2 levels de-saturating on HHFNC even with attempts to go from laying to long sitting in bed with HOB elevated (from 97% to 78% with questionable pleth, but HR (up to 110bpm) and RR (up to 35) increased as well. Will continue to follow acutely. At this time, d/t significantly decreased fxl activity tolerance, currently anticipating that pt will require f/u OT services in STR setting. RN updated on session contents. Pt's daughter present throughout. Pt stable end of session, in bed with all needs met.     Recommendations for follow up therapy are one component of a multi-disciplinary discharge planning process, led by the attending physician.  Recommendations may be updated based on patient status, additional functional criteria and insurance authorization.   Follow Up Recommendations  Skilled nursing-short term rehab (<3 hours/day)    Assistance Recommended at Discharge Frequent or constant Supervision/Assistance  Functional Status Assessment  Patient has had a recent decline  in their functional status and demonstrates the ability to make significant improvements in function in a reasonable and predictable amount of time.  Equipment Recommendations  Other (comment) (defer to next level of care)    Recommendations for Other Services       Precautions / Restrictions Precautions Precautions: Fall Restrictions Weight Bearing Restrictions: No      Mobility Bed Mobility Overal bed mobility: Needs Assistance Bed Mobility: Rolling Rolling: Mod assist;+2 for safety/equipment   Supine to sit: Mod assist Sit to supine: Mod assist   General bed mobility comments: +2 for line mgt with rolling to adjust chucks, cues for use of bed rails    Transfers                   General transfer comment: unsafe/unable to tolerate due to extreme SOB with unsupported sitting      Balance Overall balance assessment: Needs assistance Sitting-balance support: No upper extremity supported;Feet supported Sitting balance-Leahy Scale: Fair Sitting balance - Comments: deferred       Standing balance comment: deferred, unsafe                           ADL either performed or assessed with clinical judgement   ADL                                         General ADL Comments: Requires SETUP to MIN A with seated UB ADLs, MAX A for seated/bed level LB ADLs.  MOD A for rolling bed level.     Vision Patient Visual Report: No change from baseline       Perception     Praxis      Pertinent Vitals/Pain Pain Assessment: No/denies pain     Hand Dominance     Extremity/Trunk Assessment Upper Extremity Assessment Upper Extremity Assessment: Generalized weakness   Lower Extremity Assessment Lower Extremity Assessment: Generalized weakness (limited hip ROM including rotation, impacting LB ADLs. She endorses this is a struggle at baseline, but much worse now that she's gotte weaker/sicker)       Communication  Communication Communication: No difficulties   Cognition Arousal/Alertness: Awake/alert Behavior During Therapy: WFL for tasks assessed/performed Overall Cognitive Status: Within Functional Limits for tasks assessed                                 General Comments: Alert and appropriate conversationally, somewhat disoirented to situation. Provides some incorrect PLOF information that her daughter corrects (ex: states she has O2 tanks at home, but her daughter corrects her reminding her that "you used those a year or two ago, the company has picked them up since then because you didn't need them any more")     General Comments       Exercises Other Exercises Other Exercises: Educated pt and dtr re: role of OT, PLB, importance of movement to reduce atrophy, importance of core strength to support lung structures. Pt with MIN/MOD reception as she is somewhat anxious throughout session, but her daughter demos good understanding Other Exercises: Did transition to chair position in bed to optimize upright tolerance and promote pulmonary hygiene; patient tolerating well end of session.  will continue to progress towards OOB as appropriate in subsequent sessions   Shoulder Instructions      Home Living Family/patient expects to be discharged to:: Private residence Living Arrangements: Children Available Help at Discharge: Family Type of Home: House Home Access: Stairs to enter [employer] of Steps: 3 Entrance Stairs-Rails: Right;Left Home Layout: Multi-level;Able to live on main level with bedroom/bathroom               Home Equipment: Kasandra Knudsen - single point          Prior Functioning/Environment Prior Level of Function : Independent/Modified Independent             Mobility Comments: Indep for ADLs, household distances without assist device; intermittent use of SPC outside of the home.  Does endorse 3 falls in previous six months (going down stairs of  home).  Intermittent O2 at times.  Caregiver for husband, taking responsibility for all household chores, driving, errands (minimal/no physical assist to husband) ADLs Comments: INDEP with self care and caring for her spouse per pt and her daughter.        OT Problem List: Decreased strength;Decreased activity tolerance;Decreased safety awareness;Decreased knowledge of use of DME or AE;Cardiopulmonary status limiting activity      OT Treatment/Interventions: Self-care/ADL training;Therapeutic exercise;DME and/or AE instruction;Therapeutic activities;Patient/family education;Balance training    OT Goals(Current goals can be found in the care plan section) Acute Rehab OT Goals Patient Stated Goal: to go home OT Goal Formulation: With patient Time For Goal Achievement: 02/04/21 Potential to Achieve Goals: Good ADL Goals Pt Will Perform Grooming: with set-up;sitting (to complete 1-2 g/h tasks, with <10% cues for pacing/EC as needed) Pt Will Perform Upper Body Dressing: with supervision;sitting;with set-up (with modifications as needed for  EC (ex: button or zip front clothing to reduce need for shoulder flexion).) Pt Will Perform Lower Body Dressing: with min assist;with mod assist;with adaptive equipment;sitting/lateral leans (with AE for EC PRN) Pt Will Transfer to Toilet: with min assist;stand pivot transfer;bedside commode Pt Will Perform Toileting - Clothing Manipulation and hygiene: with min guard assist;sit to/from stand  OT Frequency: Min 2X/week   Barriers to D/C:            Co-evaluation              AM-PAC OT "6 Clicks" Daily Activity     Outcome Measure Help from another person eating meals?: A Little Help from another person taking care of personal grooming?: A Little Help from another person toileting, which includes using toliet, bedpan, or urinal?: A Lot Help from another person bathing (including washing, rinsing, drying)?: A Lot Help from another person to put  on and taking off regular upper body clothing?: A Little Help from another person to put on and taking off regular lower body clothing?: A Lot 6 Click Score: 15   End of Session Nurse Communication: Mobility status  Activity Tolerance: Patient tolerated treatment well Patient left: in bed  OT Visit Diagnosis: Unsteadiness on feet (R26.81);Muscle weakness (generalized) (M62.81)                Time: 8569-4370 OT Time Calculation (min): 40 min Charges:  OT General Charges $OT Visit: 1 Visit OT Evaluation $OT Eval Moderate Complexity: 1 Mod OT Treatments $Self Care/Home Management : 8-22 mins $Therapeutic Activity: 8-22 mins  Gerrianne Scale, MS, OTR/L ascom [phone] 01/21/21, 3:55 PM

## 2021-01-21 NOTE — Progress Notes (Signed)
Patient confused throughout 12 hour shift. Patient had increased work of breathing transitioned to heated high flow and then bi-pap. Heart rate low 100's to 140's. Patient received xanax this afternoon. Became increasingly agitated. ABG ordered, precedex started, metoprolol given and lasix. Sitter order placed per Dr. Loni Muse. Daughter at bedside throughout the day and updated. Continue to monitor.

## 2021-01-22 ENCOUNTER — Inpatient Hospital Stay: Payer: Medicare Other

## 2021-01-22 LAB — BLOOD GAS, ARTERIAL
Acid-base deficit: 4.4 mmol/L — ABNORMAL HIGH (ref 0.0–2.0)
Bicarbonate: 23.7 mmol/L (ref 20.0–28.0)
FIO2: 1
MECHVT: 380 mL
Mechanical Rate: 20
O2 Saturation: 90.2 %
PEEP: 5 cmH2O
Patient temperature: 37
RATE: 20 resp/min
pCO2 arterial: 54 mmHg — ABNORMAL HIGH (ref 32.0–48.0)
pH, Arterial: 7.25 — ABNORMAL LOW (ref 7.350–7.450)
pO2, Arterial: 69 mmHg — ABNORMAL LOW (ref 83.0–108.0)

## 2021-01-22 LAB — CBC WITH DIFFERENTIAL/PLATELET
Abs Immature Granulocytes: 0.15 10*3/uL — ABNORMAL HIGH (ref 0.00–0.07)
Basophils Absolute: 0 10*3/uL (ref 0.0–0.1)
Basophils Relative: 0 %
Eosinophils Absolute: 0 10*3/uL (ref 0.0–0.5)
Eosinophils Relative: 0 %
HCT: 48.3 % — ABNORMAL HIGH (ref 36.0–46.0)
Hemoglobin: 15.9 g/dL — ABNORMAL HIGH (ref 12.0–15.0)
Immature Granulocytes: 1 %
Lymphocytes Relative: 5 %
Lymphs Abs: 0.6 10*3/uL — ABNORMAL LOW (ref 0.7–4.0)
MCH: 31.5 pg (ref 26.0–34.0)
MCHC: 32.9 g/dL (ref 30.0–36.0)
MCV: 95.6 fL (ref 80.0–100.0)
Monocytes Absolute: 0.2 10*3/uL (ref 0.1–1.0)
Monocytes Relative: 2 %
Neutro Abs: 12.2 10*3/uL — ABNORMAL HIGH (ref 1.7–7.7)
Neutrophils Relative %: 92 %
Platelets: 122 10*3/uL — ABNORMAL LOW (ref 150–400)
RBC: 5.05 MIL/uL (ref 3.87–5.11)
RDW: 14.4 % (ref 11.5–15.5)
WBC: 13.3 10*3/uL — ABNORMAL HIGH (ref 4.0–10.5)
nRBC: 0 % (ref 0.0–0.2)

## 2021-01-22 LAB — COMPREHENSIVE METABOLIC PANEL
ALT: 31 U/L (ref 0–44)
AST: 53 U/L — ABNORMAL HIGH (ref 15–41)
Albumin: 2.7 g/dL — ABNORMAL LOW (ref 3.5–5.0)
Alkaline Phosphatase: 81 U/L (ref 38–126)
Anion gap: 12 (ref 5–15)
BUN: 51 mg/dL — ABNORMAL HIGH (ref 8–23)
CO2: 22 mmol/L (ref 22–32)
Calcium: 8.6 mg/dL — ABNORMAL LOW (ref 8.9–10.3)
Chloride: 113 mmol/L — ABNORMAL HIGH (ref 98–111)
Creatinine, Ser: 1.16 mg/dL — ABNORMAL HIGH (ref 0.44–1.00)
GFR, Estimated: 47 mL/min — ABNORMAL LOW (ref >60)
Glucose, Bld: 119 mg/dL — ABNORMAL HIGH (ref 70–99)
Potassium: 4.2 mmol/L (ref 3.5–5.1)
Sodium: 147 mmol/L — ABNORMAL HIGH (ref 135–145)
Total Bilirubin: 1.4 mg/dL — ABNORMAL HIGH (ref 0.3–1.2)
Total Protein: 6.9 g/dL (ref 6.5–8.1)

## 2021-01-22 LAB — D-DIMER, QUANTITATIVE: D-Dimer, Quant: 2.65 ug/mL-FEU — ABNORMAL HIGH (ref 0.00–0.50)

## 2021-01-22 LAB — DIGOXIN LEVEL: Digoxin Level: 1.1 ng/mL (ref 0.8–2.0)

## 2021-01-22 LAB — GLUCOSE, CAPILLARY: Glucose-Capillary: 142 mg/dL — ABNORMAL HIGH (ref 70–99)

## 2021-01-22 LAB — PHOSPHORUS: Phosphorus: 4.8 mg/dL — ABNORMAL HIGH (ref 2.5–4.6)

## 2021-01-22 LAB — C-REACTIVE PROTEIN: CRP: 28.8 mg/dL — ABNORMAL HIGH (ref <1.0)

## 2021-01-22 LAB — FERRITIN: Ferritin: 1072 ng/mL — ABNORMAL HIGH (ref 11–307)

## 2021-01-22 LAB — MAGNESIUM: Magnesium: 2.1 mg/dL (ref 1.7–2.4)

## 2021-01-22 MED ORDER — FENTANYL 2500MCG IN NS 250ML (10MCG/ML) PREMIX INFUSION
INTRAVENOUS | Status: AC
  Filled 2021-01-22: qty 250

## 2021-01-22 MED ORDER — PHENYLEPHRINE HCL-NACL 20-0.9 MG/250ML-% IV SOLN: 0.0000 ug/min | INTRAVENOUS | Status: DC

## 2021-01-22 MED ORDER — DOXYCYCLINE HYCLATE 100 MG PO TABS: 100.0000 mg | ORAL_TABLET | Freq: Two times a day (BID) | ORAL | Status: DC

## 2021-01-22 MED ORDER — FENTANYL CITRATE (PF) 100 MCG/2ML IJ SOLN
INTRAMUSCULAR | Status: AC
  Administered 2021-01-22: 100 ug
  Filled 2021-01-22: qty 2

## 2021-01-22 MED ORDER — SODIUM CHLORIDE 0.9 % IV SOLN
0.5000 mg/kg/h | INTRAVENOUS | Status: DC
  Administered 2021-01-22 – 2021-01-23 (×3): 0.5 mg/kg/h via INTRAVENOUS
  Filled 2021-01-22 (×7): qty 5

## 2021-01-22 MED ORDER — FUROSEMIDE 10 MG/ML IJ SOLN
40.0000 mg | Freq: Two times a day (BID) | INTRAMUSCULAR | Status: DC
  Administered 2021-01-22 – 2021-01-23 (×4): 40 mg via INTRAVENOUS
  Filled 2021-01-22 (×4): qty 4

## 2021-01-22 MED ORDER — ALBUMIN HUMAN 25 % IV SOLN
12.5000 g | Freq: Two times a day (BID) | INTRAVENOUS | Status: DC
  Administered 2021-01-22 – 2021-01-23 (×2): 12.5 g via INTRAVENOUS
  Filled 2021-01-22 (×3): qty 50

## 2021-01-22 MED ORDER — ROCURONIUM BROMIDE 10 MG/ML (PF) SYRINGE
PREFILLED_SYRINGE | INTRAVENOUS | Status: AC
  Administered 2021-01-22: 50 mg
  Filled 2021-01-22: qty 10

## 2021-01-22 MED ORDER — DEXAMETHASONE SODIUM PHOSPHATE 4 MG/ML IJ SOLN: 4.0000 mg | Freq: Two times a day (BID) | INTRAMUSCULAR | Status: DC

## 2021-01-22 MED ORDER — PHENYLEPHRINE HCL-NACL 20-0.9 MG/250ML-% IV SOLN
25.0000 ug/min | INTRAVENOUS | Status: DC
  Administered 2021-01-22 – 2021-01-23 (×2): 50 ug/min via INTRAVENOUS
  Administered 2021-01-23: 11:00:00 30 ug/min via INTRAVENOUS
  Filled 2021-01-22 (×6): qty 250

## 2021-01-22 MED ORDER — ENOXAPARIN SODIUM 100 MG/ML IJ SOSY
85.0000 mg | PREFILLED_SYRINGE | Freq: Two times a day (BID) | INTRAMUSCULAR | Status: DC
  Administered 2021-01-22 – 2021-01-23 (×3): 85 mg via SUBCUTANEOUS
  Filled 2021-01-22 (×4): qty 0.85

## 2021-01-22 MED ORDER — DOXYCYCLINE HYCLATE 100 MG IV SOLR
100.0000 mg | Freq: Two times a day (BID) | INTRAVENOUS | Status: DC
  Administered 2021-01-22 – 2021-01-23 (×3): 100 mg via INTRAVENOUS
  Filled 2021-01-22 (×4): qty 100

## 2021-01-22 MED ORDER — MIDAZOLAM HCL 2 MG/2ML IJ SOLN
2.0000 mg | INTRAMUSCULAR | Status: DC | PRN
  Administered 2021-01-22 (×2): 2 mg via INTRAVENOUS
  Filled 2021-01-22 (×2): qty 2

## 2021-01-22 MED ORDER — MIDAZOLAM HCL 2 MG/2ML IJ SOLN
INTRAMUSCULAR | Status: AC
  Administered 2021-01-22: 4 mg
  Filled 2021-01-22: qty 4

## 2021-01-22 MED ORDER — SODIUM CHLORIDE 0.9 % IV SOLN
250.0000 mL | INTRAVENOUS | Status: DC
  Administered 2021-01-22: 250 mL via INTRAVENOUS

## 2021-01-22 MED ORDER — DIGOXIN 0.25 MG/ML IJ SOLN
0.1250 mg | Freq: Every day | INTRAMUSCULAR | Status: DC
  Administered 2021-01-22 – 2021-01-23 (×2): 0.125 mg via INTRAVENOUS
  Filled 2021-01-22 (×2): qty 2

## 2021-01-22 MED ORDER — CHLORHEXIDINE GLUCONATE 0.12% ORAL RINSE (MEDLINE KIT)
15.0000 mL | Freq: Two times a day (BID) | OROMUCOSAL | Status: DC
  Administered 2021-01-22 – 2021-01-23 (×3): 15 mL via OROMUCOSAL

## 2021-01-22 MED ORDER — FENTANYL 2500MCG IN NS 250ML (10MCG/ML) PREMIX INFUSION
25.0000 ug/h | INTRAVENOUS | Status: DC
  Administered 2021-01-23: 04:00:00 150 ug/h via INTRAVENOUS
  Filled 2021-01-22: qty 250

## 2021-01-22 MED ORDER — FENTANYL BOLUS VIA INFUSION
25.0000 ug | INTRAVENOUS | Status: DC | PRN
  Filled 2021-01-22: qty 100

## 2021-01-22 MED ORDER — FENTANYL CITRATE PF 50 MCG/ML IJ SOSY: 25.0000 ug | PREFILLED_SYRINGE | Freq: Once | INTRAMUSCULAR | Status: DC

## 2021-01-22 MED ORDER — ORAL CARE MOUTH RINSE
15.0000 mL | OROMUCOSAL | Status: DC
  Administered 2021-01-22 – 2021-01-26 (×31): 15 mL via OROMUCOSAL

## 2021-01-22 MED ORDER — DOCUSATE SODIUM 50 MG/5ML PO LIQD
100.0000 mg | Freq: Two times a day (BID) | ORAL | Status: DC
  Administered 2021-01-23: 10:00:00 100 mg
  Filled 2021-01-22: qty 10

## 2021-01-22 MED ORDER — DEXAMETHASONE SODIUM PHOSPHATE 10 MG/ML IJ SOLN
8.0000 mg | Freq: Two times a day (BID) | INTRAMUSCULAR | Status: DC
Start: 2021-01-22 — End: 2021-01-23
  Administered 2021-01-22 – 2021-01-23 (×2): 8 mg via INTRAVENOUS
  Filled 2021-01-22 (×3): qty 0.8

## 2021-01-22 MED ORDER — POLYETHYLENE GLYCOL 3350 17 G PO PACK
17.0000 g | PACK | Freq: Every day | ORAL | Status: DC
  Administered 2021-01-23: 10:00:00 17 g
  Filled 2021-01-22: qty 1

## 2021-01-22 NOTE — Consult Note (Signed)
PHARMACY CONSULT NOTE  Pharmacy Consult for Electrolyte Monitoring and Replacement   Recent Labs: Potassium (mmol/L)  Date Value  01/22/2021 4.2   Magnesium (mg/dL)  Date Value  01/22/2021 2.1   Calcium (mg/dL)  Date Value  01/22/2021 8.6 (L)   Albumin (g/dL)  Date Value  01/22/2021 2.7 (L)  03/07/2017 4.4   Phosphorus (mg/dL)  Date Value  01/22/2021 4.8 (H)   Sodium (mmol/L)  Date Value  01/22/2021 147 (H)  04/22/2019 140   Assessment: Patient is an 80 y/o F with medical history including hypothyroidism, HLD, obesity, HTN, sick sinus syndrome s/p pacemaker, GERD, Afib on Eliquis, CKD who is admitted with respiratory distress in setting of known COVID-19 infection diagnosed two weeks prior to presentation. Pharmacy consulted to assist with electrolyte monitoring and replacement as indicated.  Goal of Therapy:  Electrolytes within normal limits  Plan:  No replacement required.  --Follow-up electrolytes with AM labs tomorrow  [name] 01/22/2021 7:32 AM

## 2021-01-22 NOTE — Progress Notes (Signed)
Goals of Care  The Clinical status was relayed to daughter [name] in detail. Updated and notified of patients medical condition.    Patient with increased WOB and using accessory muscles to breathe due to COVID-19 infection. Patient was intubated today and is now on mechanical ventilatory support but has not improved.   Explained to family course of therapy and the modalities  with very low chance of meaningful recovery despite all aggressive and optimal medical therapy. Patient is in the Dying  Process associated with Suffering.  Family understands the situation.  They have consented and agreed to DNR/DNI, will continue with the rest of the plan of care at this time.   Family are satisfied with Plan of action and management. All questions answered    Domingo Pulse Rust[place], AGACNP-BC [facility]    Please see Amion for pager details.

## 2021-01-22 NOTE — Procedures (Signed)
Endotracheal Intubation: Patient required placement of an artificial airway secondary to Respiratory Failure  Consent: Emergent.   Hand washing performed prior to starting the procedure.   Medications administered for sedation prior to procedure:  Midazolam 4 mg IV,  Rocuronium 40 mg IV, Fentanyl 100 mcg IV.    A time out procedure was called and correct patient, name, & ID confirmed. Needed supplies and equipment were assembled and checked to include ETT, 10 ml syringe, Glidescope, Mac and Miller blades, suction, oxygen and bag mask valve, end tidal CO2 monitor.   Patient was positioned to align the mouth and pharynx to facilitate visualization of the glottis.   Heart rate, SpO2 and blood pressure was continuously monitored during the procedure. Pre-oxygenation was conducted prior to intubation and endotracheal tube was placed through the vocal cords into the trachea.     The artificial airway was placed under direct visualization via glidescope route using a 7.5 ETT on the first attempt.  ETT was secured at 23 cm mark.  Placement was confirmed by auscuitation of lungs with good breath sounds bilaterally and no stomach sounds.  Condensation was noted on endotracheal tube.   Pulse ox 98%.  CO2 detector in place with appropriate color change.   Complications: None .     Chest radiograph ordered and pending.   Comments: OGT placed via glidescope.   Ottie Glazier, M.D.  [facility] [place]

## 2021-01-22 NOTE — Progress Notes (Signed)
ANTICOAGULATION CONSULT NOTE - Initial Consult  Pharmacy Consult for Lovenox  Indication: atrial fibrillation  Allergies  Allergen Reactions   Contrast Media [Iodinated Diagnostic Agents] Rash    Contrast dye causes swelling    Patient Measurements: Height: 5\' 1"  (154.9 cm) Weight: 83.9 kg (184 lb 15.5 oz) IBW/kg (Calculated) : 47.8 Heparin Dosing Weight:   Vital Signs: Temp: 98 F (36.7 C) (11/26 0040) Temp Source: Axillary (11/26 0040) BP: 116/77 (11/26 0200) Pulse Rate: 94 (11/26 0247)  Labs: Recent Labs    01/20/21 0323 01/21/21 0547 01/22/21 0500  HGB 14.0 14.5 15.9*  HCT 41.4 42.7 48.3*  PLT 164 140* 122*  CREATININE 1.00 0.97  --     Estimated Creatinine Clearance: 44.7 mL/min (by C-G formula based on SCr of 0.97 mg/dL).   Medical History: Past Medical History:  Diagnosis Date   Anxiety    Aortic insufficiency    Trivial   Arthritis    Asthma    Bronchitis    Bursitis, hip    Claustrophobia    Claustrophobia    Dysrhythmia    a fib   GERD (gastroesophageal reflux disease)    history of   Gout    Hyperlipidemia    Hypertension    Hypothyroidism    Mitral regurgitation    Obesity    Panic attacks    Paroxysmal atrial fibrillation (HCC)    PTSD (post-traumatic stress disorder)    Rhinitis    Sick sinus syndrome (HCC)    Sleep apnea    was ordered CPAP, but never fitted for one years ago, Dr has retired    Medications:  Medications Prior to Admission  Medication Sig Dispense Refill Last Dose   allopurinol (ZYLOPRIM) 100 MG tablet Take 100 mg by mouth 2 (two) times daily.    01/19/2021 at AM   apixaban (ELIQUIS) 5 MG TABS tablet Take 1 tablet (5 mg total) by mouth 2 (two) times daily. 180 tablet 3 12/28/2020 at AM   cyclobenzaprine (FLEXERIL) 5 MG tablet Take 1 tablet (5 mg total) by mouth 2 (two) times daily as needed for muscle spasms. 60 tablet 2 unk at unk   liothyronine (CYTOMEL) 5 MCG tablet Take 15 mcg by mouth daily.    01/16/2021  at AM   metoprolol tartrate (LOPRESSOR) 50 MG tablet TAKE ONE AND ONE-HALF TABLETS TWICE A DAY (Patient taking differently: 50 mg 2 (two) times daily.) 270 tablet 0 01/09/2021 at AM   oxyCODONE (OXY IR/ROXICODONE) 5 MG immediate release tablet Take 1 tablet (5 mg total) by mouth every 6 (six) hours as needed for severe pain. Must last 30 days 120 tablet 0    oxyCODONE (OXY IR/ROXICODONE) 5 MG immediate release tablet Take 1 tablet (5 mg total) by mouth every 6 (six) hours as needed for severe pain. Must last 30 days 120 tablet 0 unk at unk   [START ON 01/29/2021] oxyCODONE (OXY IR/ROXICODONE) 5 MG immediate release tablet Take 1 tablet (5 mg total) by mouth every 6 (six) hours as needed for severe pain. Must last 30 days 120 tablet 0 unk at unk   PLAQUENIL 200 MG tablet Take 200 mg by mouth 2 (two) times daily.      pregabalin (LYRICA) 25 MG capsule Take 1 capsule (25 mg total) by mouth 2 (two) times daily. 60 capsule 2 01/05/2021 at AM   spironolactone (ALDACTONE) 100 MG tablet Take 100 mg by mouth daily.   01/26/2021 at AM   thyroid (ARMOUR) 60  MG tablet Take 60 mg by mouth daily before breakfast.    01/26/2021 at AM   acetaminophen (TYLENOL) 500 MG tablet Take 1,000 mg by mouth every 6 (six) hours as needed for headache.   unk at unk   lubiprostone (AMITIZA) 8 MCG capsule Take 1 capsule (8 mcg total) by mouth 2 (two) times daily with a meal. Swallow whole, do not break or chew. 60 capsule 2     Assessment: Pharmacy consulted to dose lovenox in this 80 year old female with AFib.  Pt was on Eliquis 5 mg PO BID previous but is on BiPAP now so can no longer take PO.  Last dose of Eliquis was on 11/25 @ 1344. CrCl = 44.7 ml/min  TBW = 83.9 kg  Goal of Therapy:  Prevention of thromboembolism Monitor platelets by anticoagulation protocol: Yes   Plan:  Lovenox 85 mg SQ Q12H ordered to start 11/26 @ 0600. Will check CBC daily.   [name] D 01/22/2021,5:51 AM

## 2021-01-22 NOTE — Progress Notes (Signed)
Called to provide support for family after patient condition changed. Met a very upset grandson outside of room. He eventually shared his emotions and reasons with me, with his wife who was trying to keep him calm. Relayed to them I am available throughout the night if and when needed.

## 2021-01-22 NOTE — Progress Notes (Signed)
IV Team consult vasopressor protocol generated by pharmacy. Pt has US guided forearm IV already started by IV Team. No other access needed at this time per ICU RN.

## 2021-01-22 NOTE — Progress Notes (Signed)
CRITICAL CARE PROGRESS NOTE    Name: [name] MRN: [id number] DOB: 06-28-1940     LOS: 4   SUBJECTIVE FINDINGS & SIGNIFICANT EVENTS    Patient description:  80 y.o with significant PMH of chronic atrial fibrillation on Eliquis, sick sinus syndrome s/p pacemaker, HTN, HLD, CKD OSA not on CPAP, hypothyroidism and bilateral chronic hip pain who presented to the ED with chief complaints of progressive shortness of breath.   Patient developed fevers, chills, generalized weakness, cough, lethargy, and dyspnea several days before presenting to the ED. Patient and her husband took a home COVID test on 01/10/21 which came back positive. Per daughter, her primary medical doctor started her on Paxlovid, Azithromycin and Prednisone x 5 days with no improvement. Patient continued to have progressive dyspnea, refused po intake and meds due to lack of appetite and lethargy. Patient's symptoms worsened today prompting her daughter to call EMS. On EMS arrival, patient was found to be hypoxic with sats in the 50's on R.A. She was placed on 15L/min via NRB and transported to the ED.  Patient was placed on BiPAP due to significant dyspnea with isolation precautions for COVID-19. She was started on Remdesivir and steroids. PCCM consulted for admission and further management.  01/20/21- patient s/p CTPE no PE but does show bilateral GGO infiltrates consistent with COVID19 01/21/21- patient with severe anxiety, she has been bedridden since admission with atelectasis. Has not slept in 2 days she states. I spoke with daughter. PT/OT and OOB to chair today. Shes higher risk for falls ordered 1:1.  We started BIPAP today with precedex. Updated daughter in person.  01/22/21- patient had respiratory distress and became unresponsive with spO2<60%.   We spoke to daughter immediately and Tammy reviewed goals of care highlighted full code measures and instructed to proceed with ETT and MV temporarily since patient clearly stated she does not whish to die and is not ready to give up medically.  RN Jonelle Sidle Fairing confirmed codes status during [place] conference. AF is rate controlled.    Lines/tubes : External Urinary Catheter (Active)  Collection Container Dedicated Suction Canister 01/20/21 0800  Suction (Verified suction is between 40-80 mmHg) Yes 01/20/21 0800  Securement Method Leg strap 01/19/21 0700  Site Assessment Clean;Intact 01/20/21 0800  Intervention Female External Urinary Catheter Replaced 01/20/21 0800  Output (mL) 150 mL 01/20/21 1218    Microbiology/Sepsis markers: Results for orders placed or performed during the hospital encounter of 01/09/2021  Blood culture (single)     Status: None (Preliminary result)   Collection Time: 01/13/2021  7:26 PM   Specimen: BLOOD  Result Value Ref Range Status   Specimen Description BLOOD RIGHT ANTECUBITAL  Final   Special Requests   Final    BOTTLES DRAWN AEROBIC AND ANAEROBIC Blood Culture adequate volume   Culture   Final    NO GROWTH 4 DAYS Performed at [facility], [address], [place], [place] [address]    Report Status PENDING  Incomplete  Resp Panel by RT-PCR (Flu A&B, Covid) Nasopharyngeal Swab     Status: Abnormal   Collection Time: 01/25/2021  8:52 PM   Specimen: Nasopharyngeal Swab; Nasopharyngeal(NP) swabs in vial transport medium  Result Value Ref Range Status   SARS Coronavirus 2 by RT PCR POSITIVE (A) NEGATIVE Final    Comment: RESULT CALLED TO, READ BACK BY AND VERIFIED WITHToni Arthurs RN 2252 01/23/2021 HNM (NOTE) SARS-CoV-2 target nucleic acids are DETECTED.  The SARS-CoV-2 RNA is generally detectable in  upper respiratory specimens during the acute phase of infection. Positive results are indicative of the presence of the identified virus, but do not  rule out bacterial infection or co-infection with other pathogens not detected by the test. Clinical correlation with patient history and other diagnostic information is necessary to determine patient infection status. The expected result is Negative.  Fact Sheet for Patients: EntrepreneurPulse.com.au  Fact Sheet for Healthcare Providers: IncredibleEmployment.be  This test is not yet approved or cleared by the [place] FDA and  has been authorized for detection and/or diagnosis of SARS-CoV-2 by FDA under an Emergency Use Authorization (EUA).  This EUA will remain in effect (meaning this test can b e used) for the duration of  the COVID-19 declaration under Section 564(b)(1) of the Act, 21 U.S.C. section 360bbb-3(b)(1), unless the authorization is terminated or revoked sooner.     Influenza A by PCR NEGATIVE NEGATIVE Final   Influenza B by PCR NEGATIVE NEGATIVE Final    Comment: (NOTE) The Xpert Xpress SARS-CoV-2/FLU/RSV plus assay is intended as an aid in the diagnosis of influenza from Nasopharyngeal swab specimens and should not be used as a sole basis for treatment. Nasal washings and aspirates are unacceptable for Xpert Xpress SARS-CoV-2/FLU/RSV testing.  Fact Sheet for Patients: EntrepreneurPulse.com.au  Fact Sheet for Healthcare Providers: IncredibleEmployment.be  This test is not yet approved or cleared by the [place] FDA and has been authorized for detection and/or diagnosis of SARS-CoV-2 by FDA under an Emergency Use Authorization (EUA). This EUA will remain in effect (meaning this test can be used) for the duration of the COVID-19 declaration under Section 564(b)(1) of the Act, 21 U.S.C. section 360bbb-3(b)(1), unless the authorization is terminated or revoked.  Performed at [facility] [address] [place], [place] [address]   MRSA Next Gen by PCR, Nasal      Status: Abnormal   Collection Time: 01/05/2021 11:14 PM   Specimen: Nasal Mucosa; Nasal Swab  Result Value Ref Range Status   MRSA by PCR Next Gen DETECTED (A) NOT DETECTED Final    Comment: RESULT CALLED TO, READ BACK BY AND VERIFIED WITH: JUSTIN ODELL @0139  ON 01/19/21 SKL (NOTE) The GeneXpert MRSA Assay (FDA approved for NASAL specimens only), is one component of a comprehensive MRSA colonization surveillance program. It is not intended to diagnose MRSA infection nor to guide or monitor treatment for MRSA infections. Test performance is not FDA approved in patients less than 22 years old. Performed at [facility], [address] [place], [place] [address]   Culture, blood (routine x 2)     Status: None (Preliminary result)   Collection Time: 01/14/2021 11:51 PM   Specimen: BLOOD LEFT FOREARM  Result Value Ref Range Status   Specimen Description BLOOD LEFT FOREARM  Final   Special Requests   Final    BOTTLES DRAWN AEROBIC AND ANAEROBIC Blood Culture adequate volume   Culture   Final    NO GROWTH 3 DAYS Performed at [facility], [address] [place], [place] [address]    Report Status PENDING  Incomplete    Anti-infectives:  Anti-infectives (From admission, onward)    Start     Dose/Rate Route Frequency Ordered Stop   01/21/21 1530  doxycycline (VIBRAMYCIN) 100 mg in sodium chloride 0.9 % 250 mL IVPB        100 mg 125 mL/hr over 120 Minutes Intravenous 2 times daily 01/21/21 1440     01/19/21 1315  hydroxychloroquine (PLAQUENIL) tablet 200  mg        200 mg Oral 2 times daily 01/19/21 1215     01/19/21 1000  remdesivir 100 mg in sodium chloride 0.9 % 100 mL IVPB       See Hyperspace for full Linked Orders Report.   100 mg 200 mL/hr over 30 Minutes Intravenous Daily 12/29/2020 2202 01/23/21 0959   01/19/21 0200  cefTRIAXone (ROCEPHIN) 1 g in sodium chloride 0.9 % 100 mL IVPB        1 g 200 mL/hr over 30 Minutes Intravenous Every 24 hours 01/19/21 0100      01/19/21 0200  azithromycin (ZITHROMAX) 500 mg in sodium chloride 0.9 % 250 mL IVPB  Status:  Discontinued        500 mg 250 mL/hr over 60 Minutes Intravenous Every 24 hours 01/19/21 0100 01/21/21 1440   01/08/2021 2215  remdesivir 200 mg in sodium chloride 0.9% 250 mL IVPB       See Hyperspace for full Linked Orders Report.   200 mg 580 mL/hr over 30 Minutes Intravenous Once 01/10/2021 2202 01/19/21 0700        PAST MEDICAL HISTORY   Past Medical History:  Diagnosis Date   Anxiety    Aortic insufficiency    Trivial   Arthritis    Asthma    Bronchitis    Bursitis, hip    Claustrophobia    Claustrophobia    Dysrhythmia    a fib   GERD (gastroesophageal reflux disease)    history of   Gout    Hyperlipidemia    Hypertension    Hypothyroidism    Mitral regurgitation    Obesity    Panic attacks    Paroxysmal atrial fibrillation (HCC)    PTSD (post-traumatic stress disorder)    Rhinitis    Sick sinus syndrome ([place])    Sleep apnea    was ordered CPAP, but never fitted for one years ago, Dr has retired     SURGICAL HISTORY   Past Surgical History:  Procedure Laterality Date   [place] N/A 08/03/2015   Procedure: [place];  Surgeon: Alden Hipp, MD;  Location: [facility];  Service: Gynecology;  Laterality: N/A;  Patient has an ICD    INSERT / REPLACE / REMOVE PACEMAKER     PACEMAKER INSERTION     Status post insertion   PPM GENERATOR CHANGEOUT N/A 05/02/2019   Procedure: PPM GENERATOR CHANGEOUT;  Surgeon: Deboraha Sprang, MD;  Location: [facility];  Service: Cardiovascular;  Laterality: N/A;   ROTATOR CUFF REPAIR     left   Status post child birth x1     TONSILLECTOMY     TOTAL HIP ARTHROPLASTY Left 12/22/2015   Procedure: TOTAL HIP ARTHROPLASTY;  Surgeon: Dereck Leep, MD;  Location: [facility];  Service: Orthopedics;  Laterality: Left;   TOTAL HIP  REVISION Left 03/01/2018   Procedure: TOTAL HIP REVISION;  Surgeon: Dereck Leep, MD;  Location: [facility];  Service: Orthopedics;  Laterality: Left;     FAMILY HISTORY   Family History  Problem Relation Age of Onset   Stroke Mother    Heart disease Mother    Alzheimer's disease Father    Hypertension Sister    Cancer Brother    Arthritis Sister    Cancer Maternal Grandfather      SOCIAL HISTORY   Social History   Tobacco Use  Smoking status: Never   Smokeless tobacco: Never  Vaping Use   Vaping Use: Never used  Substance Use Topics   Alcohol use: No    Alcohol/week: 0.0 standard drinks   Drug use: No     MEDICATIONS   Current Medication:  Current Facility-Administered Medications:    0.9 %  sodium chloride infusion, 250 mL, Intravenous, Continuous, Rust-Chester, Britton L, NP, Last Rate: 10 mL/hr at 01/21/21 2242, 250 mL at 01/21/21 2242   albuterol (PROVENTIL) (2.5 MG/3ML) 0.083% nebulizer solution 2.5 mg, 2.5 mg, Nebulization, Q6H, Rust-Chester, Toribio Harbour L, NP, 2.5 mg at 01/22/21 0092   ascorbic acid (VITAMIN C) tablet 500 mg, 500 mg, Oral, Daily, Ouma, Bing Neighbors, NP, 500 mg at 01/21/21 0758   cefTRIAXone (ROCEPHIN) 1 g in sodium chloride 0.9 % 100 mL IVPB, 1 g, Intravenous, Q24H, Ouma, Bing Neighbors, NP, Last Rate: 200 mL/hr at 01/22/21 0125, 1 g at 01/22/21 0125   Chlorhexidine Gluconate Cloth 2 % PADS 6 each, 6 each, Topical, Daily, Ottie Glazier, MD, 6 each at 01/21/21 2055   cholecalciferol (VITAMIN D3) tablet 2,000 Units, 2,000 Units, Oral, Daily, Lorna Dibble, RPH, 2,000 Units at 01/21/21 0757   dexmedetomidine (PRECEDEX) 400 MCG/100ML (4 mcg/mL) infusion, 0.4-1.2 mcg/kg/hr, Intravenous, Titrated, Ottie Glazier, MD, Last Rate: 14.68 mL/hr at 01/22/21 0124, 0.7 mcg/kg/hr at 01/22/21 0124   digoxin (LANOXIN) 0.25 MG/ML injection 0.125 mg, 0.125 mg, Intravenous, Daily, Rust-Chester, Toribio Harbour L, NP   docusate sodium (COLACE) capsule 100 mg, 100  mg, Oral, BID PRN, Lang Snow, NP, 100 mg at 01/20/21 1704   doxycycline (VIBRAMYCIN) 100 mg in sodium chloride 0.9 % 250 mL IVPB, 100 mg, Intravenous, BID, Beers, Shanon Brow, RPH, Last Rate: 125 mL/hr at 01/21/21 2239, 100 mg at 01/21/21 2239   enoxaparin (LOVENOX) injection 85 mg, 85 mg, Subcutaneous, Q12H, Ottie Glazier, MD, 85 mg at 01/22/21 0657   feeding supplement (ENSURE ENLIVE / ENSURE PLUS) liquid 237 mL, 237 mL, Oral, TID BM, Lanney Gins, Sydna Brodowski, MD, 237 mL at 01/21/21 1345   fentaNYL (SUBLIMAZE) 100 MCG/2ML injection, , , ,    fentaNYL 10 mcg/ml infusion, , , ,    furosemide (LASIX) injection 40 mg, 40 mg, Intravenous, BID, Dink Creps, MD   guaiFENesin-dextromethorphan (ROBITUSSIN DM) 100-10 MG/5ML syrup 10 mL, 10 mL, Oral, Q4H PRN, Ouma, Bing Neighbors, NP   hydroxychloroquine (PLAQUENIL) tablet 200 mg, 200 mg, Oral, BID, Beers, Shanon Brow, RPH, 200 mg at 01/21/21 0759   ketamine (KETALAR) 500 mg in sodium chloride 0.9 % 100 mL (5 mg/mL) infusion, 0.5 mg/kg/hr, Intravenous, Continuous, Glema Takaki, MD   methylPREDNISolone sodium succinate (SOLU-MEDROL) 125 mg/2 mL injection 41.875 mg, 0.5 mg/kg, Intravenous, Q12H, 41.875 mg at 01/22/21 0759 **FOLLOWED BY** [START ON 01/24/2021] predniSONE (DELTASONE) tablet 50 mg, 50 mg, Oral, Daily, Rust-Chester, Toribio Harbour L, NP   metoprolol tartrate (LOPRESSOR) injection 2.5 mg, 2.5 mg, Intravenous, Q1H PRN, Ottie Glazier, MD, 2.5 mg at 01/21/21 1715   midazolam (VERSED) 2 MG/2ML injection, , , ,    midodrine (PROAMATINE) tablet 10 mg, 10 mg, Oral, TID WC, Loyce Flaming, MD   morphine 2 MG/ML injection 1-2 mg, 1-2 mg, Intravenous, Q4H PRN, Rust-Chester, Britton L, NP, 2 mg at 01/22/21 0502   multivitamin with minerals tablet 1 tablet, 1 tablet, Oral, Daily, Tarshia Kot, MD   norepinephrine (LEVOPHED) 4mg  in 230mL premix infusion, 2-10 mcg/min, Intravenous, Titrated, Rust-Chester, Britton L, NP   oxyCODONE (Oxy IR/ROXICODONE)  immediate release tablet 5 mg, 5 mg, Oral,  Q6H PRN, Lorna Dibble, RPH, 5 mg at 01/19/21 1255   polyethylene glycol (MIRALAX / GLYCOLAX) packet 17 g, 17 g, Oral, Daily PRN, Lang Snow, NP, 17 g at 01/20/21 1304   pregabalin (LYRICA) capsule 25 mg, 25 mg, Oral, BID, Beers, Shanon Brow, RPH, 25 mg at 01/21/21 0758   [COMPLETED] remdesivir 200 mg in sodium chloride 0.9% 250 mL IVPB, 200 mg, Intravenous, Once, Stopping Infusion hung by another clincian at 01/19/21 0700 **FOLLOWED BY** remdesivir 100 mg in sodium chloride 0.9 % 100 mL IVPB, 100 mg, Intravenous, Daily, Benita Gutter, RPH, Last Rate: 200 mL/hr at 01/21/21 0903, 100 mg at 01/21/21 0903   rocuronium bromide 100 MG/10ML SOSY, , , ,    sodium chloride flush (NS) 0.9 % injection 10-40 mL, 10-40 mL, Intracatheter, Q12H, Ottie Glazier, MD, 10 mL at 01/21/21 2120   sodium chloride flush (NS) 0.9 % injection 10-40 mL, 10-40 mL, Intracatheter, PRN, Ottie Glazier, MD   zinc sulfate capsule 220 mg, 220 mg, Oral, Daily, Lang Snow, NP, 220 mg at 01/21/21 0805    ALLERGIES   Contrast media [iodinated diagnostic agents]    REVIEW OF SYSTEMS     10 point ROS negative except asper subj findings  PHYSICAL EXAMINATION   Vital Signs: Temp:  [96 F (35.6 C)-98.5 F (36.9 C)] 96 F (35.6 C) (11/26 0800) Pulse Rate:  [40-119] 90 (11/26 0726) Resp:  [21-39] 36 (11/26 0726) BP: (89-169)/(42-125) 107/75 (11/26 0600) SpO2:  [63 %-98 %] 65 % (11/26 0830) FiO2 (%):  [100 %] 100 % (11/26 0800)  GENERAL distress due to COVID19 and severe anxiety HEAD: Normocephalic, atraumatic.  EYES: Pupils equal, round, reactive to light.  No scleral icterus.  MOUTH: Moist mucosal membrane. NECK: Supple. No thyromegaly. No nodules. No JVD.  PULMONARY: mild ronchi CARDIOVASCULAR: S1 and S2. Regular rate and rhythm. No murmurs, rubs, or gallops.  GASTROINTESTINAL: Soft, nontender, non-distended. No masses. Positive bowel  sounds. No hepatosplenomegaly.  MUSCULOSKELETAL: No swelling, clubbing, or edema.  NEUROLOGIC: GCS6 SKIN:intact,warm,dry   PERTINENT DATA     Infusions:  sodium chloride 250 mL (01/21/21 2242)   cefTRIAXone (ROCEPHIN)  IV 1 g (01/22/21 0125)   dexmedetomidine (PRECEDEX) IV infusion 0.7 mcg/kg/hr (01/22/21 0124)   doxycycline (VIBRAMYCIN) IV 100 mg (01/21/21 2239)   fentaNYL     ketamine (KETALAR) Adult IV Infusion     norepinephrine (LEVOPHED) Adult infusion     remdesivir 100 mg in NS 100 mL 100 mg (01/21/21 0903)   Scheduled Medications:  albuterol  2.5 mg Nebulization Q6H   vitamin C  500 mg Oral Daily   Chlorhexidine Gluconate Cloth  6 each Topical Daily   cholecalciferol  2,000 Units Oral Daily   digoxin  0.125 mg Intravenous Daily   enoxaparin (LOVENOX) injection  85 mg Subcutaneous Q12H   feeding supplement  237 mL Oral TID BM   fentaNYL       furosemide  40 mg Intravenous BID   hydroxychloroquine  200 mg Oral BID   methylPREDNISolone (SOLU-MEDROL) injection  0.5 mg/kg Intravenous Q12H   Followed by   Derrill Memo ON 01/24/2021] predniSONE  50 mg Oral Daily   midazolam       midodrine  10 mg Oral TID WC   multivitamin with minerals  1 tablet Oral Daily   pregabalin  25 mg Oral BID   rocuronium bromide       sodium chloride flush  10-40 mL Intracatheter Q12H   zinc  sulfate  220 mg Oral Daily   PRN Medications: docusate sodium, guaiFENesin-dextromethorphan, metoprolol tartrate, morphine injection, oxyCODONE, polyethylene glycol, sodium chloride flush Hemodynamic parameters:   Intake/Output: 11/25 0701 - 11/26 0700 In: 10 [I.V.:10] Out: 1450 [Urine:1450]  Ventilator  Settings: FiO2 (%):  [100 %] 100 %   LAB RESULTS:  Basic Metabolic Panel: Recent Labs  Lab 12/28/2020 1926 01/19/21 0313 01/20/21 0323 01/21/21 0547 01/22/21 0500  NA 137 140 142 142 147*  K 4.0 4.4 4.2 4.5 4.2  CL 112* 113* 112* 116* 113*  CO2 18* 20* 23 18* 22  GLUCOSE 100* 134* 154* 110*  119*  BUN 46* 47* 54* 49* 51*  CREATININE 1.19* 1.16* 1.00 0.97 1.16*  CALCIUM 7.8* 8.0* 8.6* 8.3* 8.6*  MG  --  1.6* 2.2 1.9 2.1  PHOS  --  3.4 3.2 2.8 4.8*    Liver Function Tests: Recent Labs  Lab 01/17/2021 1926 01/19/21 0313 01/20/21 0323 01/21/21 0547 01/22/21 0500  AST 33 36 37 47* 53*  ALT 21 21 23 22 31   ALKPHOS 52 52 60 65 81  BILITOT 1.2 0.9 0.9 1.0 1.4*  PROT 6.0* 5.9* 6.6 5.9* 6.9  ALBUMIN 2.5* 2.4* 2.7* 2.5* 2.7*    No results for input(s): LIPASE, AMYLASE in the last 168 hours. No results for input(s): AMMONIA in the last 168 hours. CBC: Recent Labs  Lab 01/04/2021 1926 01/16/2021 2351 01/19/21 0313 01/20/21 0323 01/21/21 0547 01/22/21 0500  WBC 6.7 6.7 4.2 9.0 10.5 13.3*  NEUTROABS 5.8  --  3.7 7.9* 9.7* 12.2*  HGB 13.6 13.6 13.2 14.0 14.5 15.9*  HCT 40.4 40.3 39.0 41.4 42.7 48.3*  MCV 94.6 93.9 94.7 94.5 94.3 95.6  PLT 146* 144* 142* 164 140* 122*    Cardiac Enzymes: No results for input(s): CKTOTAL, CKMB, CKMBINDEX, TROPONINI in the last 168 hours. BNP: Invalid input(s): POCBNP CBG: Recent Labs  Lab 01/21/2021 2323  GLUCAP 98        IMAGING RESULTS:  Imaging: No results found. @PROBHOSP @ No results found.       ASSESSMENT AND PLAN   Acute Hypoxic Respiratory Failure secondary to COVID-19 Pneumonia  Tested + 11/14 treated with Paxlovid, Prednisone and Azithromycin x 5 days -Place on HHFNC, wean FiO2 as tolerated -BiPAP, wean as tolerated -Supplemental O2 as needed to maintain O2 saturations 88 to 92% -Follow intermittent ABG and chest x-ray as needed -Ensure adequate pulmonary hygiene  -F/u cultures, trend PCT, wbc, monitor fever curve -As needed bronchodilators -steroids - Dexamethasone 4 bid -Start Empiric abx coverage given risk factors (Asthma, Bronchitis ) with Ceftriaxone + Azithromycin pending cultures, consider de-escalation if appropriate -Start Remdesivir x5 days  -Continue steroids. -Encourage OOB, IS, FV, and  awake proning if able -Continue airborne, contact precautions for 21 days from positive testing. -Daily CRP, CMP, CBC, D-dimer -Lovenox theraputic due to AF    Partial SBO- RESOLVED    - CT abd with dialated loops and Fecal material is noted within the rectal vault consistent with a degree of constipation - PATIENT HAD 2BMS - 01/21/21  AKI on CKD Stage III BUN/Cr.: abnormal -Monitor I&O's / urinary output -Follow BMP -Ensure adequate renal perfusion -Avoid nephrotoxic agents as able -Replace electrolytes as indicated   Chronic Atrial Fibrlilation       -on full lovenox Hx: SSS s/p pacemaker Thromboembolic risk factors ( age  -2, HTN-1, Gender-1) for a CHADSVASc Score of >=4 -Eliquis >>Heparin>>eiquis -Metroprolol 2.5 -DIGOXIN 500MCG X 1 AND 125 DAILY-pharnD monitoring levels  Hypertension HLD  -Continuous cardiac monitoring -Maintain MAP greater than 65 -Continue metoprolol as BP permits -Hold Spironolactone for now -Repeat 2D Echocardiogram   Chronic Pain Syndrome -Continue home meds oxy, Pregabalin once able to take po   Hypothyroidism -Check TSH -Continue Armour     Best practice:  Diet:  NPO Pain/Anxiety/Delirium protocol (if indicated): No VAP protocol (if indicated): Not indicated DVT prophylaxis: Subcutaneous Heparin GI prophylaxis: H2B Glucose control:  SSI No Central venous access:  N/A Arterial line:  N/A Foley:  N/A Mobility:  bed rest  PT consulted: N/A Daily multidisciplinary goals of care discussion  Code Status:  limited Disposition: ICU     = Goals of Care = Code Status Order: LIMITED CODE  Primary Emergency Contact: [place], Home Phone: [phone] Discussed code status with patient's Husband and Duaghter Tammy. They confirm that patient wishes to pursue full aggressive treatment and intervention options, including CPR/ACLS Drugs and Defibrillation EXCEPT intubation, goals of care will be addressed on going with family if that  should become necessary.  Family would like to be immediately contacted in such situations.   Critical care provider statement:   Total critical care time: 109 minutes   Performed by: Lanney Gins MD   Critical care time was exclusive of separately billable procedures and treating other patients.   Critical care was necessary to treat or prevent imminent or life-threatening deterioration.   Critical care was time spent personally by me on the following activities: development of treatment plan with patient and/or surrogate as well as nursing, discussions with consultants, evaluation of patient's response to treatment, examination of patient, obtaining history from patient or surrogate, ordering and performing treatments and interventions, ordering and review of laboratory studies, ordering and review of radiographic studies, pulse oximetry and re-evaluation of patient's condition.    Ottie Glazier, M.D.  Pulmonary & Critical Care Medicine

## 2021-01-22 NOTE — TOC Initial Note (Signed)
Transition of Care [facility]) - Initial/Assessment Note    Patient Details  Name: [name] MRN: [id number] Date of Birth: May 02, 1940  Transition of Care [facility]) CM/SW Contact:    Shelbie Hutching, RN Phone Number: 01/22/2021, 1:33 PM  Clinical Narrative:                 Patient admitted to the hospital for COVID 19 with acute respiratory failure.  Patient required intubation this morning and is on the ventilator and sedated.   Patient is from home with her husband.  Before intubation PT worked with patient and recommended SNF.  TOC will follow up with patient and family once her respiratory status is stable and she is no longer on the vent.    Expected Discharge Plan: Skilled Nursing Facility Barriers to Discharge: Continued Medical Work up   Patient Goals and CMS Choice Patient states their goals for this hospitalization and ongoing recovery are:: Patient is intubated on the ventilator      Expected Discharge Plan and Services Expected Discharge Plan: [place] arrangements for the past 2 months: Single Family Home                 DME Arranged: N/A DME Agency: NA                  Prior Living Arrangements/Services Living arrangements for the past 2 months: Single Family Home Lives with:: Spouse Patient language and need for interpreter reviewed:: Yes        Need for Family Participation in Patient Care: Yes (Comment) (COVID) Care giver support system in place?: Yes (comment) (husband and daughter)   Criminal Activity/Legal Involvement Pertinent to Current Situation/Hospitalization: No - Comment as needed  Activities of Daily Living Home Assistive Devices/Equipment: Cane (specify quad or straight) ADL Screening (condition at time of admission) Patient's cognitive ability adequate to safely complete daily activities?: Yes Is the patient deaf or have difficulty hearing?: No Does the patient have difficulty seeing, even when wearing  glasses/contacts?: Yes Does the patient have difficulty concentrating, remembering, or making decisions?: No Patient able to express need for assistance with ADLs?: Yes Does the patient have difficulty dressing or bathing?: Yes Independently performs ADLs?: No Does the patient have difficulty walking or climbing stairs?: Yes Weakness of Legs: Both Weakness of Arms/Hands: Both  Permission Sought/Granted                  Emotional Assessment   Attitude/Demeanor/Rapport: Unable to Assess Affect (typically observed): Unable to Assess   Alcohol / Substance Use: Not Applicable Psych Involvement: No (comment)  Admission diagnosis:  Acute respiratory failure with hypoxemia (HCC) [J96.01] Acute respiratory failure due to COVID-19 ([place]) [U07.1, J96.00] COVID-19 [U07.1] Patient Active Problem List   Diagnosis Date Noted   Acute respiratory failure due to COVID-19 ([place]) 01/19/2021   DDD (degenerative disc disease), lumbosacral 10/27/2020   Chronic use of opiate for therapeutic purpose 08/01/2020   Chronic musculoskeletal pain 12/16/2018   Muscle spasms of lower extremity (left hip) 12/16/2018   High serum vitamin B12 12/16/2018   CKD stage G3a/A1, GFR 45-59 and albumin creatinine ratio <30 mg/g (HCC) 12/16/2018   Hypomagnesemia 12/16/2018   Pharmacologic therapy 11/18/2018   Disorder of skeletal system 11/18/2018   Problems influencing health status 11/18/2018   Chronic hip pain (Bilateral) 11/18/2018   Chronic hip pain s/p THR  (Left) 11/18/2018   Neurogenic pain 11/18/2018   Chronic anticoagulation (Eliquis)  11/18/2018   S/P revision of total hip 03/01/2018   Hematuria 10/16/2017   Disorder of bone, unspecified 03/07/2017   Other specified health status 03/07/2017   CKD (chronic kidney disease) stage 3, GFR 30-59 ml/min (HCC) 05/11/2016   Anemia of chronic disease 05/11/2016   Hypocalcemia 05/11/2016   Chronic pain syndrome 02/14/2016   S/P total hip replacement (Left)  02/06/2016   S/P total hip arthroplasty 12/22/2015   Hypothyroidism (acquired) 12/01/2015   Long term (current) use of opiate analgesic 12/01/2015   Morbid obesity with BMI of 40.0-44.9, adult ([place]) 12/01/2015   PTSD (post-traumatic stress disorder) 12/01/2015   Spinal stenosis of lumbar region 12/01/2015   Opioid-induced constipation (OIC) 09/30/2015   Avascular necrosis of left femoral head ([place]) 08/17/2015   Chronic hip pain (1ry area of Pain) (Left) 08/11/2015   Long term current use of opiate analgesic 08/11/2015   Long term prescription opiate use 08/11/2015   Opiate use (54 MME/Day) 08/11/2015   Encounter for therapeutic drug level monitoring 08/11/2015   Disturbance of skin sensation 08/11/2015   Osteoarthritis of hip (Left) 08/11/2015   Trochanteric bursitis of hip (Left) 08/11/2015   Lumbar central spinal stenosis (L4-5) 08/11/2015   Lumbar facet arthropathy ([place]) 08/11/2015   Osteoarthritis of sacroiliac joint (with vacuum phenomena in) (Bilateral) 08/11/2015   Atrial fibrillation ([place]) 02/16/2015   GERD (gastroesophageal reflux disease) 10/27/2010   Cough    Cardiac pacemaker in situ 06/05/2008   HYPOTHYROIDISM 06/04/2008   HYPERLIPIDEMIA 06/04/2008   OBESITY 06/04/2008   Essential hypertension 06/04/2008   SICK SINUS SYNDROME 06/04/2008   Sick sinus syndrome ([place]) 06/04/2008   PCP:  Bryson Corona, NP Pharmacy:   [place], [place] - [address] [place] [address] Phone: [phone] Fax: [phone]  [facility] [place], [place] [address] [place] [address] Phone: [phone] Fax: [phone]     Social Determinants of Health ([place]) Interventions    Readmission Risk Interventions Readmission Risk Prevention Plan 01/22/2021  Transportation Screening Complete  PCP or Specialist Appt within 3-5 Days Complete  HRI or [place] Not Complete  HRI or  Home Care Consult comments patient on vent and PT recommended SNF  Social Work Consult for [place] Planning/Counseling Complete  Medication Review Press photographer) Complete  Some recent data might be hidden

## 2021-01-23 ENCOUNTER — Inpatient Hospital Stay: Payer: Medicare Other

## 2021-01-23 ENCOUNTER — Encounter: Payer: Self-pay | Admitting: Pulmonary Disease

## 2021-01-23 LAB — CBC WITH DIFFERENTIAL/PLATELET
Abs Immature Granulocytes: 0.2 10*3/uL — ABNORMAL HIGH (ref 0.00–0.07)
Basophils Absolute: 0 10*3/uL (ref 0.0–0.1)
Basophils Relative: 0 %
Eosinophils Absolute: 0 10*3/uL (ref 0.0–0.5)
Eosinophils Relative: 0 %
HCT: 44.2 % (ref 36.0–46.0)
Hemoglobin: 14.2 g/dL (ref 12.0–15.0)
Immature Granulocytes: 1 %
Lymphocytes Relative: 2 %
Lymphs Abs: 0.5 10*3/uL — ABNORMAL LOW (ref 0.7–4.0)
MCH: 31.8 pg (ref 26.0–34.0)
MCHC: 32.1 g/dL (ref 30.0–36.0)
MCV: 98.9 fL (ref 80.0–100.0)
Monocytes Absolute: 0.5 10*3/uL (ref 0.1–1.0)
Monocytes Relative: 2 %
Neutro Abs: 20.1 10*3/uL — ABNORMAL HIGH (ref 1.7–7.7)
Neutrophils Relative %: 95 %
Platelets: 142 10*3/uL — ABNORMAL LOW (ref 150–400)
RBC: 4.47 MIL/uL (ref 3.87–5.11)
RDW: 14.9 % (ref 11.5–15.5)
WBC: 21.3 10*3/uL — ABNORMAL HIGH (ref 4.0–10.5)
nRBC: 0 % (ref 0.0–0.2)

## 2021-01-23 LAB — D-DIMER, QUANTITATIVE: D-Dimer, Quant: 1.53 ug/mL-FEU — ABNORMAL HIGH (ref 0.00–0.50)

## 2021-01-23 LAB — GLUCOSE, CAPILLARY
Glucose-Capillary: 117 mg/dL — ABNORMAL HIGH (ref 70–99)
Glucose-Capillary: 145 mg/dL — ABNORMAL HIGH (ref 70–99)
Glucose-Capillary: 128 mg/dL — ABNORMAL HIGH (ref 70–99)
Glucose-Capillary: 123 mg/dL — ABNORMAL HIGH (ref 70–99)
Glucose-Capillary: 125 mg/dL — ABNORMAL HIGH (ref 70–99)
Glucose-Capillary: 144 mg/dL — ABNORMAL HIGH (ref 70–99)
Glucose-Capillary: 160 mg/dL — ABNORMAL HIGH (ref 70–99)

## 2021-01-23 LAB — COMPREHENSIVE METABOLIC PANEL
ALT: 25 U/L (ref 0–44)
AST: 26 U/L (ref 15–41)
Albumin: 2.7 g/dL — ABNORMAL LOW (ref 3.5–5.0)
Alkaline Phosphatase: 66 U/L (ref 38–126)
Anion gap: 8 (ref 5–15)
BUN: 63 mg/dL — ABNORMAL HIGH (ref 8–23)
CO2: 24 mmol/L (ref 22–32)
Calcium: 8.1 mg/dL — ABNORMAL LOW (ref 8.9–10.3)
Chloride: 117 mmol/L — ABNORMAL HIGH (ref 98–111)
Creatinine, Ser: 1.48 mg/dL — ABNORMAL HIGH (ref 0.44–1.00)
GFR, Estimated: 35 mL/min — ABNORMAL LOW (ref >60)
Glucose, Bld: 132 mg/dL — ABNORMAL HIGH (ref 70–99)
Potassium: 4.2 mmol/L (ref 3.5–5.1)
Sodium: 149 mmol/L — ABNORMAL HIGH (ref 135–145)
Total Bilirubin: 1 mg/dL (ref 0.3–1.2)
Total Protein: 6.2 g/dL — ABNORMAL LOW (ref 6.5–8.1)

## 2021-01-23 LAB — BLOOD GAS, ARTERIAL
Acid-base deficit: 3.9 mmol/L — ABNORMAL HIGH (ref 0.0–2.0)
Bicarbonate: 22.2 mmol/L (ref 20.0–28.0)
FIO2: 100
MECHVT: 380 mL
Mechanical Rate: 20
O2 Saturation: 94.1 %
PEEP: 5 cmH2O
Patient temperature: 37
pCO2 arterial: 43 mmHg (ref 32.0–48.0)
pH, Arterial: 7.32 — ABNORMAL LOW (ref 7.350–7.450)
pO2, Arterial: 77 mmHg — ABNORMAL LOW (ref 83.0–108.0)

## 2021-01-23 LAB — SEDIMENTATION RATE: Sed Rate: 52 mm/hr — ABNORMAL HIGH (ref 0–30)

## 2021-01-23 LAB — CULTURE, BLOOD (SINGLE)
Culture: NO GROWTH
Special Requests: ADEQUATE

## 2021-01-23 LAB — MAGNESIUM: Magnesium: 2.2 mg/dL (ref 1.7–2.4)

## 2021-01-23 LAB — C-REACTIVE PROTEIN: CRP: 20.6 mg/dL — ABNORMAL HIGH (ref <1.0)

## 2021-01-23 LAB — PHOSPHORUS: Phosphorus: 5.1 mg/dL — ABNORMAL HIGH (ref 2.5–4.6)

## 2021-01-23 LAB — FERRITIN: Ferritin: 850 ng/mL — ABNORMAL HIGH (ref 11–307)

## 2021-01-23 MED ORDER — MORPHINE SULFATE (PF) 2 MG/ML IV SOLN
INTRAVENOUS | Status: AC
  Administered 2021-01-23: 21:00:00 2 mg via INTRAVENOUS
  Filled 2021-01-23: qty 1

## 2021-01-23 MED ORDER — MORPHINE BOLUS VIA INFUSION
1.0000 mg | INTRAVENOUS | Status: DC | PRN
  Filled 2021-01-23: qty 1

## 2021-01-23 MED ORDER — ENOXAPARIN SODIUM 80 MG/0.8ML IJ SOSY
1.0000 mg/kg | PREFILLED_SYRINGE | INTRAMUSCULAR | Status: DC
Start: 2021-01-24 — End: 2021-01-23

## 2021-01-23 MED ORDER — MORPHINE SULFATE (PF) 2 MG/ML IV SOLN
1.0000 mg | Freq: Once | INTRAVENOUS | Status: AC
  Administered 2021-01-23: 1 mg via INTRAVENOUS

## 2021-01-23 MED ORDER — DEXMEDETOMIDINE HCL IN NACL 400 MCG/100ML IV SOLN
0.4000 ug/kg/h | INTRAVENOUS | Status: DC
  Administered 2021-01-23: 16:00:00 0.4 ug/kg/h via INTRAVENOUS
  Administered 2021-01-23: 20:00:00 0.8 ug/kg/h via INTRAVENOUS
  Administered 2021-01-23 – 2021-01-26 (×11): 1.2 ug/kg/h via INTRAVENOUS
  Filled 2021-01-23 (×16): qty 100

## 2021-01-23 MED ORDER — MORPHINE BOLUS VIA INFUSION: 2.0000 mg | INTRAVENOUS | Status: DC | PRN

## 2021-01-23 MED ORDER — DEXAMETHASONE SODIUM PHOSPHATE 10 MG/ML IJ SOLN
6.0000 mg | Freq: Two times a day (BID) | INTRAMUSCULAR | Status: DC
  Administered 2021-01-23: 23:00:00 6 mg via INTRAVENOUS
  Filled 2021-01-23 (×2): qty 0.6

## 2021-01-23 MED ORDER — MORPHINE SULFATE (PF) 2 MG/ML IV SOLN
2.0000 mg | INTRAVENOUS | Status: DC | PRN
  Filled 2021-01-23: qty 1

## 2021-01-23 MED ORDER — MORPHINE 100MG IN NS 100ML (1MG/ML) PREMIX INFUSION
1.0000 mg/h | INTRAVENOUS | Status: DC
  Administered 2021-01-24: 01:00:00 1 mg/h via INTRAVENOUS
  Filled 2021-01-23: qty 100

## 2021-01-23 NOTE — Progress Notes (Signed)
1445 patient extubated patient daughter at bedside agreeable to place pt on HL for 02 needs agreeable for no reintubation for that was the patients wishes.  1545 Dex started for patient resp labored and and increase in HR daughter at bedside agreeable with plan of care

## 2021-01-23 NOTE — Progress Notes (Signed)
Goals of Care  The Clinical status was relayed to the patient's daughter, [name], in detail. Updated her of patients respiratory deterioration with increased agitation, work of breathing & hypoxia despite continuous & PRN medication.  Explained to family course of therapy and the modalities  Patient with a very low chance of meaningful recovery despite all aggressive and optimal medical therapy. Patient is in the Dying  Process associated with Suffering.  Family understands the situation.  They have consented and agreed to DNR/DNI and would like to proceed with Comfort care measures.  Family are satisfied with Plan of action and management. All questions answered  Additional CC time 32 mins   Domingo Pulse [place], AGACNP-BC [facility]    Please see Amion for pager details.

## 2021-01-23 NOTE — Consult Note (Signed)
PHARMACY CONSULT NOTE  Pharmacy Consult for Electrolyte Monitoring and Replacement   Recent Labs: Potassium (mmol/L)  Date Value  01/22/2021 4.2   Magnesium (mg/dL)  Date Value  01/22/2021 2.1   Calcium (mg/dL)  Date Value  01/22/2021 8.6 (L)   Albumin (g/dL)  Date Value  01/22/2021 2.7 (L)  03/07/2017 4.4   Phosphorus (mg/dL)  Date Value  01/22/2021 4.8 (H)   Sodium (mmol/L)  Date Value  01/22/2021 147 (H)  04/22/2019 140   Assessment: Patient is an 80 y/o F with medical history including hypothyroidism, HLD, obesity, HTN, sick sinus syndrome s/p pacemaker, GERD, Afib on Eliquis, CKD who is admitted with respiratory distress in setting of known COVID-19 infection diagnosed two weeks prior to presentation. Pharmacy consulted to assist with electrolyte monitoring and replacement as indicated.  Goal of Therapy:  Electrolytes within normal limits  Plan:  No replacement required.  --Follow-up electrolytes with AM labs tomorrow  Oswald Hillock 01/23/2021 8:32 AM

## 2021-01-23 NOTE — Progress Notes (Signed)
CRITICAL CARE PROGRESS NOTE    Name: [name] MRN: [id number] DOB: [birth year]     LOS: 5   SUBJECTIVE FINDINGS & SIGNIFICANT EVENTS    Patient description:  80 y.o with significant PMH of chronic atrial fibrillation on Eliquis, sick sinus syndrome s/p pacemaker, HTN, HLD, CKD OSA not on CPAP, hypothyroidism and bilateral chronic hip pain who presented to the ED with chief complaints of progressive shortness of breath.   Patient developed fevers, chills, generalized weakness, cough, lethargy, and dyspnea several days before presenting to the ED. Patient and her husband took a home COVID test on 01/10/21 which came back positive. Per daughter, her primary medical doctor started her on Paxlovid, Azithromycin and Prednisone x 5 days with no improvement. Patient continued to have progressive dyspnea, refused po intake and meds due to lack of appetite and lethargy. Patient's symptoms worsened today prompting her daughter to call EMS. On EMS arrival, patient was found to be hypoxic with sats in the 50's on R.A. She was placed on 15L/min via NRB and transported to the ED.  Patient was placed on BiPAP due to significant dyspnea with isolation precautions for COVID-19. She was started on Remdesivir and steroids. PCCM consulted for admission and further management.  01/20/21- patient s/p CTPE no PE but does show bilateral GGO infiltrates consistent with COVID19 01/21/21- patient with severe anxiety, she has been bedridden since admission with atelectasis. Has not slept in 2 days she states. I spoke with daughter. PT/OT and OOB to chair today. Shes higher risk for falls ordered 1:1.  We started BIPAP today with precedex. Updated daughter in person.  01/22/21- patient had respiratory distress and became unresponsive with spO2<60%.   We spoke to daughter immediately and Tammy reviewed goals of care highlighted full code measures and instructed to proceed with ETT and MV temporarily since patient clearly stated she does not whish to die and is not ready to give up medically.  RN Jonelle Sidle Fairing confirmed codes status during [place] conference. AF is rate controlled.  01/23/21-  had meeting with daughter today, plan for extubation without re-intubation as per patients wishes. She is doing ok on HFNC and precedex with spo2 >88% and non-tachycardic.    Lines/tubes : External Urinary Catheter (Active)  Collection Container Dedicated Suction Canister 01/20/21 0800  Suction (Verified suction is between 40-80 mmHg) Yes 01/20/21 0800  Securement Method Leg strap 01/19/21 0700  Site Assessment Clean;Intact 01/20/21 0800  Intervention Female External Urinary Catheter Replaced 01/20/21 0800  Output (mL) 150 mL 01/20/21 1218    Microbiology/Sepsis markers: Results for orders placed or performed during the hospital encounter of 01/26/2021  Blood culture (single)     Status: None   Collection Time: 01/17/2021  7:26 PM   Specimen: BLOOD  Result Value Ref Range Status   Specimen Description BLOOD RIGHT ANTECUBITAL  Final   Special Requests   Final    BOTTLES DRAWN AEROBIC AND ANAEROBIC Blood Culture adequate volume   Culture   Final    NO GROWTH 5 DAYS Performed at [facility], [address] [place], [place] [address]    Report Status 01/23/2021 FINAL  Final  Resp Panel by RT-PCR (Flu A&B, Covid) Nasopharyngeal Swab     Status: Abnormal   Collection Time: 01/22/2021  8:52 PM   Specimen: Nasopharyngeal Swab; Nasopharyngeal(NP) swabs in vial transport medium  Result Value Ref Range Status   SARS Coronavirus 2 by RT PCR POSITIVE (A) NEGATIVE Final    Comment: RESULT  CALLED TO, READ BACK BY AND VERIFIED WITH: Toni Arthurs RN 2252 01/16/2021 HNM (NOTE) SARS-CoV-2 target nucleic acids are DETECTED.  The SARS-CoV-2 RNA is  generally detectable in upper respiratory specimens during the acute phase of infection. Positive results are indicative of the presence of the identified virus, but do not rule out bacterial infection or co-infection with other pathogens not detected by the test. Clinical correlation with patient history and other diagnostic information is necessary to determine patient infection status. The expected result is Negative.  Fact Sheet for Patients: EntrepreneurPulse.com.au  Fact Sheet for Healthcare Providers: IncredibleEmployment.be  This test is not yet approved or cleared by the [place] FDA and  has been authorized for detection and/or diagnosis of SARS-CoV-2 by FDA under an Emergency Use Authorization (EUA).  This EUA will remain in effect (meaning this test can b e used) for the duration of  the COVID-19 declaration under Section 564(b)(1) of the Act, 21 U.S.C. section 360bbb-3(b)(1), unless the authorization is terminated or revoked sooner.     Influenza A by PCR NEGATIVE NEGATIVE Final   Influenza B by PCR NEGATIVE NEGATIVE Final    Comment: (NOTE) The Xpert Xpress SARS-CoV-2/FLU/RSV plus assay is intended as an aid in the diagnosis of influenza from Nasopharyngeal swab specimens and should not be used as a sole basis for treatment. Nasal washings and aspirates are unacceptable for Xpert Xpress SARS-CoV-2/FLU/RSV testing.  Fact Sheet for Patients: EntrepreneurPulse.com.au  Fact Sheet for Healthcare Providers: IncredibleEmployment.be  This test is not yet approved or cleared by the [place] FDA and has been authorized for detection and/or diagnosis of SARS-CoV-2 by FDA under an Emergency Use Authorization (EUA). This EUA will remain in effect (meaning this test can be used) for the duration of the COVID-19 declaration under Section 564(b)(1) of the Act, 21 U.S.C. section 360bbb-3(b)(1),  unless the authorization is terminated or revoked.  Performed at [facility], [address] [place], [place] [address]   MRSA Next Gen by PCR, Nasal     Status: Abnormal   Collection Time: 01/14/2021 11:14 PM   Specimen: Nasal Mucosa; Nasal Swab  Result Value Ref Range Status   MRSA by PCR Next Gen DETECTED (A) NOT DETECTED Final    Comment: RESULT CALLED TO, READ BACK BY AND VERIFIED WITH: JUSTIN ODELL _0  ON 01/19/21 SKL (NOTE) The GeneXpert MRSA Assay (FDA approved for NASAL specimens only), is one component of a comprehensive MRSA colonization surveillance program. It is not intended to diagnose MRSA infection nor to guide or monitor treatment for MRSA infections. Test performance is not FDA approved in patients less than 75 years old. Performed at [facility], [address] [place], [place] [address]   Culture, blood (routine x 2)     Status: None (Preliminary result)   Collection Time: 12/30/2020 11:51 PM   Specimen: BLOOD LEFT FOREARM  Result Value Ref Range Status   Specimen Description BLOOD LEFT FOREARM  Final   Special Requests   Final    BOTTLES DRAWN AEROBIC AND ANAEROBIC Blood Culture adequate volume   Culture   Final    NO GROWTH 4 DAYS Performed at [facility], [address], [place],  [address]    Report Status PENDING  Incomplete    Anti-infectives:  Anti-infectives (From admission, onward)    Start     Dose/Rate Route Frequency Ordered Stop   01/22/21 2300  doxycycline (VIBRAMYCIN) 100 mg in sodium chloride 0.9 % 250 mL IVPB  100 mg 125 mL/hr over 120 Minutes Intravenous Every 12 hours 01/22/21 2221     01/22/21 2200  doxycycline (VIBRA-TABS) tablet 100 mg  Status:  Discontinued        100 mg Per Tube Every 12 hours 01/22/21 1553 01/22/21 2221   01/21/21 1530  doxycycline (VIBRAMYCIN) 100 mg in sodium chloride 0.9 % 250 mL IVPB  Status:  Discontinued        100 mg 125 mL/hr over 120 Minutes Intravenous  2 times daily 01/21/21 1440 01/22/21 1553   01/19/21 1315  hydroxychloroquine (PLAQUENIL) tablet 200 mg  Status:  Discontinued        200 mg Oral 2 times daily 01/19/21 1215 01/22/21 0913   01/19/21 1000  remdesivir 100 mg in sodium chloride 0.9 % 100 mL IVPB       See Hyperspace for full Linked Orders Report.   100 mg 200 mL/hr over 30 Minutes Intravenous Daily 01/10/2021 2202 01/23/21 0717   01/19/21 0200  cefTRIAXone (ROCEPHIN) 1 g in sodium chloride 0.9 % 100 mL IVPB  Status:  Discontinued        1 g 200 mL/hr over 30 Minutes Intravenous Every 24 hours 01/19/21 0100 01/22/21 0913   01/19/21 0200  azithromycin (ZITHROMAX) 500 mg in sodium chloride 0.9 % 250 mL IVPB  Status:  Discontinued        500 mg 250 mL/hr over 60 Minutes Intravenous Every 24 hours 01/19/21 0100 01/21/21 1440   01/01/2021 2215  remdesivir 200 mg in sodium chloride 0.9% 250 mL IVPB       See Hyperspace for full Linked Orders Report.   200 mg 580 mL/hr over 30 Minutes Intravenous Once 01/24/2021 2202 01/19/21 0700        PAST MEDICAL HISTORY   Past Medical History:  Diagnosis Date   Anxiety    Aortic insufficiency    Trivial   Arthritis    Asthma    Bronchitis    Bursitis, hip    Claustrophobia    Claustrophobia    Dysrhythmia    a fib   GERD (gastroesophageal reflux disease)    history of   Gout    Hyperlipidemia    Hypertension    Hypothyroidism    Mitral regurgitation    Obesity    Panic attacks    Paroxysmal atrial fibrillation (HCC)    PTSD (post-traumatic stress disorder)    Rhinitis    Sick sinus syndrome ([place])    Sleep apnea    was ordered CPAP, but never fitted for one years ago, Dr has retired     SURGICAL HISTORY   Past Surgical History:  Procedure Laterality Date   [place] N/A 08/03/2015   Procedure: [place];  Surgeon: Alden Hipp, MD;  Location: [facility];  Service:  Gynecology;  Laterality: N/A;  Patient has an ICD    INSERT / REPLACE / REMOVE PACEMAKER     PACEMAKER INSERTION     Status post insertion   PPM GENERATOR CHANGEOUT N/A 05/02/2019   Procedure: PPM GENERATOR CHANGEOUT;  Surgeon: Deboraha Sprang, MD;  Location: [facility];  Service: Cardiovascular;  Laterality: N/A;   ROTATOR CUFF REPAIR     left   Status post child birth x1     TONSILLECTOMY     TOTAL HIP ARTHROPLASTY Left 12/22/2015   Procedure: TOTAL HIP ARTHROPLASTY;  Surgeon: Dereck Leep, MD;  Location: [facility];  Service: Orthopedics;  Laterality: Left;   TOTAL HIP REVISION Left 03/01/2018   Procedure: TOTAL HIP REVISION;  Surgeon: Dereck Leep, MD;  Location: [facility];  Service: Orthopedics;  Laterality: Left;     FAMILY HISTORY   Family History  Problem Relation Age of Onset   Stroke Mother    Heart disease Mother    Alzheimer's disease Father    Hypertension Sister    Cancer Brother    Arthritis Sister    Cancer Maternal Grandfather      SOCIAL HISTORY   Social History   Tobacco Use   Smoking status: Never   Smokeless tobacco: Never  Vaping Use   Vaping Use: Never used  Substance Use Topics   Alcohol use: No    Alcohol/week: 0.0 standard drinks   Drug use: No     MEDICATIONS   Current Medication:  Current Facility-Administered Medications:    0.9 %  sodium chloride infusion, 250 mL, Intravenous, Continuous, Rust-Chester, Britton L, NP, Last Rate: 10 mL/hr at 01/21/21 2242, 250 mL at 01/21/21 2242   0.9 %  sodium chloride infusion, 250 mL, Intravenous, Continuous, Rauer, Forde Dandy, RPH, Last Rate: 10 mL/hr at 01/23/21 0800, Infusion Verify at 01/23/21 0800   albumin human 25 % solution 12.5 g, 12.5 g, Intravenous, BID, Ottie Glazier, MD, Last Rate: 60 mL/hr at 01/23/21 1012, 12.5 g at 01/23/21 1012   albuterol (PROVENTIL) (2.5 MG/3ML) 0.083% nebulizer solution 2.5 mg, 2.5 mg, Nebulization, Q6H, Rust-Chester, Britton L, NP, 2.5 mg at 01/23/21  0751   chlorhexidine gluconate (MEDLINE KIT) (PERIDEX) 0.12 % solution 15 mL, 15 mL, Mouth Rinse, BID, Lanney Gins, Coni Homesley, MD, 15 mL at 01/23/21 0800   Chlorhexidine Gluconate Cloth 2 % PADS 6 each, 6 each, Topical, Daily, Ottie Glazier, MD, 6 each at 01/22/21 1049   cholecalciferol (VITAMIN D3) tablet 2,000 Units, 2,000 Units, Oral, Daily, Beers, Shanon Brow, RPH, 2,000 Units at 01/23/21 1010   dexamethasone (DECADRON) injection 8 mg, 8 mg, Intravenous, Q12H, Latash Nouri, MD, 8 mg at 01/23/21 1016   digoxin (LANOXIN) 0.25 MG/ML injection 0.125 mg, 0.125 mg, Intravenous, Daily, Rust-Chester, Britton L, NP, 0.125 mg at 01/23/21 1020   docusate (COLACE) 50 MG/5ML liquid 100 mg, 100 mg, Per Tube, BID, Rust-Chester, Toribio Harbour L, NP, 100 mg at 01/23/21 1011   docusate sodium (COLACE) capsule 100 mg, 100 mg, Oral, BID PRN, Lang Snow, NP, 100 mg at 01/20/21 1704   doxycycline (VIBRAMYCIN) 100 mg in sodium chloride 0.9 % 250 mL IVPB, 100 mg, Intravenous, Q12H, Rust-Chester, Britton L, NP, Last Rate: 125 mL/hr at 01/23/21 1051, 100 mg at 01/23/21 1051   enoxaparin (LOVENOX) injection 85 mg, 85 mg, Subcutaneous, Q12H, Lanney Gins, Kinan Safley, MD, 85 mg at 01/23/21 0545   fentaNYL (SUBLIMAZE) bolus via infusion 25-100 mcg, 25-100 mcg, Intravenous, Q15 min PRN, Rust-Chester, Toribio Harbour L, NP   fentaNYL (SUBLIMAZE) injection 25 mcg, 25 mcg, Intravenous, Once, Rust-Chester, Britton L, NP   fentaNYL 256mg in NS 2573m(1059mml) infusion-PREMIX, 25-200 mcg/hr, Intravenous, Continuous, Rust-Chester, Britton L, NP, Last Rate: 12.5 mL/hr at 01/23/21 1046, 125 mcg/hr at 01/23/21 1046   furosemide (LASIX) injection 40 mg, 40 mg, Intravenous, BID, AleLanney Ginsuad, MD, 40 mg at 01/23/21 1018   ketamine (KETALAR) 500 mg in sodium chloride 0.9 % 100 mL (5 mg/mL) infusion, 0.5 mg/kg/hr, Intravenous, Continuous, Caron Tardif, MD, Last Rate: 8.39 mL/hr at 01/23/21 1008, 0.5 mg/kg/hr at 01/23/21 1008   MEDLINE mouth rinse,  15  mL, Mouth Rinse, 10 times per day, Ottie Glazier, MD, 15 mL at 01/23/21 1021   metoprolol tartrate (LOPRESSOR) injection 2.5 mg, 2.5 mg, Intravenous, Q1H PRN, Ottie Glazier, MD, 2.5 mg at 01/21/21 1715   midazolam (VERSED) injection 2 mg, 2 mg, Intravenous, Q2H PRN, Rust-Chester, Huel Cote, NP, 2 mg at 01/22/21 2056   phenylephrine (NEO-SYNEPHRINE) 65m/NS 2550mpremix infusion, 25-200 mcg/min, Intravenous, Titrated, Rauer, Samantha O, RPH, Last Rate: 22.5 mL/hr at 01/23/21 1049, 30 mcg/min at 01/23/21 1049   polyethylene glycol (MIRALAX / GLYCOLAX) packet 17 g, 17 g, Oral, Daily PRN, OuLang SnowNP, 17 g at 01/20/21 1304   polyethylene glycol (MIRALAX / GLYCOLAX) packet 17 g, 17 g, Per Tube, Daily, Rust-Chester, Britton L, NP, 17 g at 01/23/21 1011   sodium chloride flush (NS) 0.9 % injection 10-40 mL, 10-40 mL, Intracatheter, Q12H, AlLanney GinsFuad, MD, 10 mL at 01/23/21 1023   sodium chloride flush (NS) 0.9 % injection 10-40 mL, 10-40 mL, Intracatheter, PRN, AlOttie GlazierMD    ALLERGIES   Contrast media [iodinated diagnostic agents]    REVIEW OF SYSTEMS     10 point ROS negative except asper subj findings  PHYSICAL EXAMINATION   Vital Signs: Temp:  [96.5 F (35.8 C)-99.6 F (37.6 C)] 99.6 F (37.6 C) (11/27 0800) Pulse Rate:  [88-143] 100 (11/27 1030) Resp:  [0-30] 11 (11/27 1030) BP: (63-136)/(26-94) 104/73 (11/27 1000) SpO2:  [87 %-99 %] 88 % (11/27 1030) FiO2 (%):  [50 %-100 %] 50 % (11/27 1030) Weight:  [81.7 kg] 81.7 kg (11/27 0500)  GENERAL distress due to COVID19 and severe anxiety HEAD: Normocephalic, atraumatic.  EYES: Pupils equal, round, reactive to light.  No scleral icterus.  MOUTH: Moist mucosal membrane. NECK: Supple. No thyromegaly. No nodules. No JVD.  PULMONARY: mild ronchi CARDIOVASCULAR: S1 and S2. Regular rate and rhythm. No murmurs, rubs, or gallops.  GASTROINTESTINAL: Soft, nontender, non-distended. No masses. Positive bowel  sounds. No hepatosplenomegaly.  MUSCULOSKELETAL: No swelling, clubbing, or edema.  NEUROLOGIC: GCS6 SKIN:intact,warm,dry   PERTINENT DATA     Infusions:  sodium chloride 250 mL (01/21/21 2242)   sodium chloride 10 mL/hr at 01/23/21 0800   albumin human 12.5 g (01/23/21 1012)   doxycycline (VIBRAMYCIN) IV 100 mg (01/23/21 1051)   fentaNYL infusion INTRAVENOUS 125 mcg/hr (01/23/21 1046)   ketamine (KETALAR) Adult IV Infusion 0.5 mg/kg/hr (01/23/21 1008)   phenylephrine (NEO-SYNEPHRINE) Adult infusion 30 mcg/min (01/23/21 1049)   Scheduled Medications:  albuterol  2.5 mg Nebulization Q6H   chlorhexidine gluconate (MEDLINE KIT)  15 mL Mouth Rinse BID   Chlorhexidine Gluconate Cloth  6 each Topical Daily   cholecalciferol  2,000 Units Oral Daily   dexamethasone (DECADRON) injection  8 mg Intravenous Q12H   digoxin  0.125 mg Intravenous Daily   docusate  100 mg Per Tube BID   enoxaparin (LOVENOX) injection  85 mg Subcutaneous Q12H   fentaNYL (SUBLIMAZE) injection  25 mcg Intravenous Once   furosemide  40 mg Intravenous BID   mouth rinse  15 mL Mouth Rinse 10 times per day   polyethylene glycol  17 g Per Tube Daily   sodium chloride flush  10-40 mL Intracatheter Q12H   PRN Medications: docusate sodium, fentaNYL, metoprolol tartrate, midazolam, polyethylene glycol, sodium chloride flush Hemodynamic parameters:   Intake/Output: 11/26 0701 - 11/27 0700 In: 1775.4 [I.V.:1458; NG/GT:30; IV Piggyback:287.3] Out: 2300 [Urine:2050; Emesis/NG output:250]  Ventilator  Settings: Vent Mode: PCV FiO2 (%):  [50 %-100 %] 50 %  Set Rate:  [0 bmp-18 bmp] 18 bmp Vt Set:  [380 mL] 380 mL PEEP:  [5 cmH20] 5 cmH20   LAB RESULTS:  Basic Metabolic Panel: Recent Labs  Lab 01/19/21 0313 01/20/21 0323 01/21/21 0547 01/22/21 0500 01/23/21 0849  NA 140 142 142 147* 149*  K 4.4 4.2 4.5 4.2 4.2  CL 113* 112* 116* 113* 117*  CO2 20* 23 18* 22 24  GLUCOSE 134* 154* 110* 119* 132*  BUN 47*  54* 49* 51* 63*  CREATININE 1.16* 1.00 0.97 1.16* 1.48*  CALCIUM 8.0* 8.6* 8.3* 8.6* 8.1*  MG 1.6* 2.2 1.9 2.1 2.2  PHOS 3.4 3.2 2.8 4.8* 5.1*    Liver Function Tests: Recent Labs  Lab 01/19/21 0313 01/20/21 0323 01/21/21 0547 01/22/21 0500 01/23/21 0849  AST 36 37 47* 53* 26  ALT _0 ALKPHOS 52 60 65 81 66  BILITOT 0.9 0.9 1.0 1.4* 1.0  PROT 5.9* 6.6 5.9* 6.9 6.2*  ALBUMIN 2.4* 2.7* 2.5* 2.7* 2.7*    No results for input(s): LIPASE, AMYLASE in the last 168 hours. No results for input(s): AMMONIA in the last 168 hours. CBC: Recent Labs  Lab 01/19/21 0313 01/20/21 0323 01/21/21 0547 01/22/21 0500 01/23/21 0849  WBC 4.2 9.0 10.5 13.3* 21.3*  NEUTROABS 3.7 7.9* 9.7* 12.2* 20.1*  HGB 13.2 14.0 14.5 15.9* 14.2  HCT 39.0 41.4 42.7 48.3* 44.2  MCV 94.7 94.5 94.3 95.6 98.9  PLT 142* 164 140* 122* 142*    Cardiac Enzymes: No results for input(s): CKTOTAL, CKMB, CKMBINDEX, TROPONINI in the last 168 hours. BNP: Invalid input(s): POCBNP CBG: Recent Labs  Lab 01/03/2021 2323 01/22/21 2255 01/23/21 0135 01/23/21 0318 01/23/21 0753  GLUCAP 98 142* 117* 145* 128*        IMAGING RESULTS:  Imaging: DG Chest Port 1 View  Result Date: 01/23/2021 CLINICAL DATA:  Difficulty breathing EXAM: PORTABLE CHEST 1 VIEW COMPARISON:  Previous studies including the examination of 01/22/2021 FINDINGS: Transverse diameter of heart is slightly increased. Tip of endotracheal tube is 3.5 cm above the carina. Tip NG tube is seen in the fundus of the stomach. Left hemidiaphragm is elevated. There is increase in size of left apical pneumothorax. There is decrease in right apical pneumothorax. There is interval decrease in subcutaneous emphysema. Small amount of pneumomediastinum is seen. Pacemaker battery is seen in the left infraclavicular region. There is slight improvement in aeration in both lungs with slight decrease in extensive infiltrates in both lungs. Lateral CP angles  are indistinct. IMPRESSION: There is increase in size of left apical pneumothorax and decrease in size of small right apical pneumothorax. There is interval improvement in the aeration in both lungs suggesting resolving pneumonia. There is interval decrease in subcutaneous emphysema. Electronically Signed   By: Elmer Picker M.D.   On: 01/23/2021 08:11   DG Chest Port 1 View  Result Date: 01/22/2021 CLINICAL DATA:  80 year old female with endotracheal and endotracheal tube placements. EXAM: PORTABLE CHEST 1 VIEW COMPARISON:  01/20/2021 CT prior studies FINDINGS: An endotracheal tube with tip 2.7 cm above the carina and NG tube with tip overlying the proximal to mid stomach noted. Very small biapical pneumothoraces, pneumomediastinum and subcutaneous emphysema are now noted. Increased conspicuity of the LEFT heart border likely represents a LEFT basilar pneumothorax as well. Stable to slightly increasing diffuse bilateral airspace opacities are noted. A LEFT sided pacemaker is again identified. IMPRESSION: 1. Very small biapical pneumothoraces, pneumomediastinum and subcutaneous emphysema. Probable LEFT basilar  pneumothorax. 2. Diffuse bilateral airspace opacities, stable to slightly increasing. 3. Endotracheal tube and NG tube as described. Critical Value/emergent results were called by telephone at the time of interpretation on 01/22/2021 at 10:02 am to provider Tiffany, nurse for this patient, who verbally acknowledged these results. Electronically Signed   By: Margarette Canada M.D.   On: 01/22/2021 10:00   _0 @ DG Chest Port 1 View  Result Date: 01/23/2021 CLINICAL DATA:  Difficulty breathing EXAM: PORTABLE CHEST 1 VIEW COMPARISON:  Previous studies including the examination of 01/22/2021 FINDINGS: Transverse diameter of heart is slightly increased. Tip of endotracheal tube is 3.5 cm above the carina. Tip NG tube is seen in the fundus of the stomach. Left hemidiaphragm is elevated. There is  increase in size of left apical pneumothorax. There is decrease in right apical pneumothorax. There is interval decrease in subcutaneous emphysema. Small amount of pneumomediastinum is seen. Pacemaker battery is seen in the left infraclavicular region. There is slight improvement in aeration in both lungs with slight decrease in extensive infiltrates in both lungs. Lateral CP angles are indistinct. IMPRESSION: There is increase in size of left apical pneumothorax and decrease in size of small right apical pneumothorax. There is interval improvement in the aeration in both lungs suggesting resolving pneumonia. There is interval decrease in subcutaneous emphysema. Electronically Signed   By: Elmer Picker M.D.   On: 01/23/2021 08:11         ASSESSMENT AND PLAN   Acute Hypoxic Respiratory Failure secondary to COVID-19 Pneumonia  Tested + 11/14 treated with Paxlovid, Prednisone and Azithromycin x 5 days -Place on HHFNC, wean FiO2 as tolerated -extubated 11/27 to HFNC -Supplemental O2 as needed to maintain O2 saturations 88 to 92% -Follow intermittent ABG and chest x-ray as needed -Ensure adequate pulmonary hygiene  -F/u cultures, trend PCT, wbc, monitor fever curve -As needed bronchodilators -steroids - Dexamethasone 4 bid -Start Empiric abx coverage given risk factors (Asthma, Bronchitis ) with Ceftriaxone + Azithromycin pending cultures, consider de-escalation if appropriate -Start Remdesivir x5 days  -Continue steroids. -Encourage OOB, IS, FV, and awake proning if able -Continue airborne, contact precautions for 21 days from positive testing. -Daily CRP, CMP, CBC, D-dimer -Lovenox theraputic due to AF    Partial SBO- RESOLVED    - CT abd with dialated loops and Fecal material is noted within the rectal vault consistent with a degree of constipation - PATIENT HAD 2BMS - 01/21/21  AKI on CKD Stage III BUN/Cr.: abnormal -Monitor I&O's / urinary output -Follow BMP -Ensure  adequate renal perfusion -Avoid nephrotoxic agents as able -Replace electrolytes as indicated   Chronic Atrial Fibrlilation       -on full lovenox Hx: SSS s/p pacemaker Thromboembolic risk factors ( age  -2, HTN-1, Gender-1) for a CHADSVASc Score of >=4 -Eliquis >>Heparin>>eiquis -Metroprolol 2.5 -DIGOXIN 500MCG X 1 AND 125 DAILY-pharnD monitoring levels    Hypertension HLD  -Continuous cardiac monitoring -Maintain MAP greater than 65 -Continue metoprolol as BP permits -Hold Spironolactone for now -Repeat 2D Echocardiogram   Chronic Pain Syndrome -Continue home meds oxy, Pregabalin once able to take po   Hypothyroidism -Check TSH -Continue Armour     Best practice:  Diet:  NPO Pain/Anxiety/Delirium protocol (if indicated): No VAP protocol (if indicated): Not indicated DVT prophylaxis: Subcutaneous Heparin GI prophylaxis: H2B Glucose control:  SSI No Central venous access:  N/A Arterial line:  N/A Foley:  N/A Mobility:  bed rest  PT consulted: N/A Daily multidisciplinary goals of care discussion  Code Status:  limited Disposition: ICU     = Goals of Care = Code Status Order: LIMITED CODE  Primary Emergency Contact: Kindig,Lonnie, Home Phone: [phone] Discussed code status with patient's Husband and Duaghter Tammy. They confirm that patient wishes to pursue full aggressive treatment and intervention options, including CPR/ACLS Drugs and Defibrillation EXCEPT intubation, goals of care will be addressed on going with family if that should become necessary.  Family would like to be immediately contacted in such situations.   Critical care provider statement:   Total critical care time: 109 minutes   Performed by: Lanney Gins MD   Critical care time was exclusive of separately billable procedures and treating other patients.   Critical care was necessary to treat or prevent imminent or life-threatening deterioration.   Critical care was time spent personally  by me on the following activities: development of treatment plan with patient and/or surrogate as well as nursing, discussions with consultants, evaluation of patient's response to treatment, examination of patient, obtaining history from patient or surrogate, ordering and performing treatments and interventions, ordering and review of laboratory studies, ordering and review of radiographic studies, pulse oximetry and re-evaluation of patient's condition.    Ottie Glazier, M.D.  Pulmonary & Critical Care Medicine

## 2021-01-23 NOTE — Plan of Care (Signed)
  Problem: Education: Goal: Knowledge of General Education information will improve Description: Including pain rating scale, medication(s)/side effects and non-pharmacologic comfort measures Outcome: Not Progressing   Problem: Clinical Measurements: Goal: Ability to maintain clinical measurements within normal limits will improve Outcome: Not Progressing Goal: Will remain free from infection Outcome: Not Progressing Goal: Diagnostic test results will improve Outcome: Not Progressing Goal: Respiratory complications will improve Outcome: Not Progressing Goal: Cardiovascular complication will be avoided Outcome: Not Progressing   Problem: Activity: Goal: Risk for activity intolerance will decrease Outcome: Not Progressing   Problem: Nutrition: Goal: Adequate nutrition will be maintained Outcome: Not Progressing   Problem: Coping: Goal: Level of anxiety will decrease Outcome: Not Progressing   Problem: Elimination: Goal: Will not experience complications related to bowel motility Outcome: Not Progressing Goal: Will not experience complications related to urinary retention Outcome: Not Progressing   Problem: Pain Managment: Goal: General experience of comfort will improve Outcome: Not Progressing   Problem: Safety: Goal: Ability to remain free from injury will improve Outcome: Not Progressing   Problem: Skin Integrity: Goal: Risk for impaired skin integrity will decrease Outcome: Not Progressing Patient total care unable to make all needs known unable to have oral nutrition unable to swallow

## 2021-01-23 NOTE — Progress Notes (Signed)
Patient extubated to [facility] of 100% @ 60L per verbal request of MD. Patient saturations are 86-88% on [place] at this time, MD aware.

## 2021-01-23 NOTE — Consult Note (Signed)
ANTICOAGULATION CONSULT NOTE  Pharmacy Consult for Lovenox Indication: atrial fibrillation  Patient Measurements: Height: 5\' 1"  (154.9 cm) Weight: 81.7 kg (180 lb 1.9 oz) IBW/kg (Calculated) : 47.8  Labs: Recent Labs    01/21/21 0547 01/22/21 0500 01/23/21 0849  HGB 14.5 15.9* 14.2  HCT 42.7 48.3* 44.2  PLT 140* 122* 142*  CREATININE 0.97 1.16* 1.48*    Estimated Creatinine Clearance: 28.9 mL/min (A) (by C-G formula based on SCr of 1.48 mg/dL (H)).   Medical History: Past Medical History:  Diagnosis Date   Anxiety    Aortic insufficiency    Trivial   Arthritis    Asthma    Bronchitis    Bursitis, hip    Claustrophobia    Claustrophobia    Dysrhythmia    a fib   GERD (gastroesophageal reflux disease)    history of   Gout    Hyperlipidemia    Hypertension    Hypothyroidism    Mitral regurgitation    Obesity    Panic attacks    Paroxysmal atrial fibrillation (HCC)    PTSD (post-traumatic stress disorder)    Rhinitis    Sick sinus syndrome (HCC)    Sleep apnea    was ordered CPAP, but never fitted for one years ago, Dr has retired    Medications:  Apixaban 5 mg BID prior to admission (last patient reported dose 11/22 AM)  Assessment: Patient is an 80 y/o F with medical history including Afib on Eliquis who is admitted with acute respiratory failure secondary to COVID-19 pneumonia and AKI on CKD. Patient is currently intubated, sedated, and on mechanical ventilation in the ICU. Home Eliquis on hold. Pharmacy consulted for Lovenox dosing for Afib.   Goal of Therapy:  Anti-Xa level 0.6-1 units/ml 4hrs after LMWH dose given Monitor platelets by anticoagulation protocol: Yes   Plan:  --Scr worsening (~ 1 >> 1.48) and now CrCl < 30 mL/min. 2050 UOP documented yesterday --Will adjust Lovenox to 82.5 mg (1 mg/kg) [place] q24h --CBC at least every 3 days per protocol --Continue to monitor renal function and adjust dosing as indicated  Benita Gutter 01/23/2021,1:44 PM

## 2021-01-24 DIAGNOSIS — U071 COVID-19: Secondary | ICD-10-CM | POA: Diagnosis not present

## 2021-01-24 DIAGNOSIS — J9600 Acute respiratory failure, unspecified whether with hypoxia or hypercapnia: Secondary | ICD-10-CM | POA: Diagnosis not present

## 2021-01-24 LAB — BASIC METABOLIC PANEL
Anion gap: 10 (ref 5–15)
BUN: 69 mg/dL — ABNORMAL HIGH (ref 8–23)
CO2: 26 mmol/L (ref 22–32)
Calcium: 8.4 mg/dL — ABNORMAL LOW (ref 8.9–10.3)
Chloride: 113 mmol/L — ABNORMAL HIGH (ref 98–111)
Creatinine, Ser: 1.38 mg/dL — ABNORMAL HIGH (ref 0.44–1.00)
GFR, Estimated: 38 mL/min — ABNORMAL LOW (ref >60)
Glucose, Bld: 190 mg/dL — ABNORMAL HIGH (ref 70–99)
Potassium: 3.6 mmol/L (ref 3.5–5.1)
Sodium: 149 mmol/L — ABNORMAL HIGH (ref 135–145)

## 2021-01-24 LAB — CBC
HCT: 42.5 % (ref 36.0–46.0)
Hemoglobin: 14.2 g/dL (ref 12.0–15.0)
MCH: 31.7 pg (ref 26.0–34.0)
MCHC: 33.4 g/dL (ref 30.0–36.0)
MCV: 94.9 fL (ref 80.0–100.0)
Platelets: 94 10*3/uL — ABNORMAL LOW (ref 150–400)
RBC: 4.48 MIL/uL (ref 3.87–5.11)
RDW: 14.3 % (ref 11.5–15.5)
WBC: 10.2 10*3/uL (ref 4.0–10.5)
nRBC: 0 % (ref 0.0–0.2)

## 2021-01-24 LAB — CULTURE, BLOOD (ROUTINE X 2)
Culture: NO GROWTH
Special Requests: ADEQUATE

## 2021-01-24 LAB — MAGNESIUM: Magnesium: 2.4 mg/dL (ref 1.7–2.4)

## 2021-01-24 LAB — C-REACTIVE PROTEIN: CRP: 12.4 mg/dL — ABNORMAL HIGH (ref <1.0)

## 2021-01-24 LAB — GLUCOSE, CAPILLARY
Glucose-Capillary: 171 mg/dL — ABNORMAL HIGH (ref 70–99)
Glucose-Capillary: 146 mg/dL — ABNORMAL HIGH (ref 70–99)

## 2021-01-24 LAB — FERRITIN: Ferritin: 703 ng/mL — ABNORMAL HIGH (ref 11–307)

## 2021-01-24 LAB — PHOSPHORUS: Phosphorus: 3.7 mg/dL (ref 2.5–4.6)

## 2021-01-24 MED ORDER — VITAMIN D 25 MCG (1000 UNIT) PO TABS
2000.0000 [IU] | ORAL_TABLET | Freq: Every day | ORAL | Status: DC
  Filled 2021-01-24: qty 2

## 2021-01-24 MED ORDER — DOCUSATE SODIUM 100 MG PO CAPS: 100.0000 mg | ORAL_CAPSULE | Freq: Two times a day (BID) | ORAL | Status: DC | PRN

## 2021-01-24 MED ORDER — DEXAMETHASONE SODIUM PHOSPHATE 10 MG/ML IJ SOLN
6.0000 mg | Freq: Two times a day (BID) | INTRAMUSCULAR | Status: DC
  Administered 2021-01-24 – 2021-01-26 (×5): 6 mg via INTRAVENOUS
  Filled 2021-01-24 (×7): qty 0.6

## 2021-01-24 MED ORDER — MORPHINE SULFATE (PF) 2 MG/ML IV SOLN
2.0000 mg | INTRAVENOUS | Status: DC | PRN
  Administered 2021-01-24 – 2021-01-25 (×6): 2 mg via INTRAVENOUS
  Filled 2021-01-24 (×8): qty 1

## 2021-01-24 MED ORDER — ENOXAPARIN SODIUM 80 MG/0.8ML IJ SOSY
1.0000 mg/kg | PREFILLED_SYRINGE | INTRAMUSCULAR | Status: DC
  Administered 2021-01-24: 08:00:00 82.5 mg via SUBCUTANEOUS
  Filled 2021-01-24: qty 1.6

## 2021-01-24 MED ORDER — ENOXAPARIN SODIUM 80 MG/0.8ML IJ SOSY
1.0000 mg/kg | PREFILLED_SYRINGE | Freq: Two times a day (BID) | INTRAMUSCULAR | Status: DC
  Administered 2021-01-24 – 2021-01-26 (×3): 82.5 mg via SUBCUTANEOUS
  Filled 2021-01-24 (×3): qty 1.6

## 2021-01-24 MED ORDER — DIGOXIN 0.25 MG/ML IJ SOLN
0.1250 mg | Freq: Every day | INTRAMUSCULAR | Status: DC
  Administered 2021-01-24 – 2021-01-25 (×2): 0.125 mg via INTRAVENOUS
  Filled 2021-01-24 (×3): qty 2

## 2021-01-24 MED ORDER — POLYETHYLENE GLYCOL 3350 17 G PO PACK: 17.0000 g | PACK | Freq: Every day | ORAL | Status: DC | PRN

## 2021-01-24 MED ORDER — SODIUM CHLORIDE 0.9 % IV SOLN
100.0000 mg | Freq: Two times a day (BID) | INTRAVENOUS | Status: DC
  Administered 2021-01-24 – 2021-01-26 (×5): 100 mg via INTRAVENOUS
  Filled 2021-01-24 (×7): qty 100

## 2021-01-24 NOTE — Consult Note (Signed)
ANTICOAGULATION CONSULT NOTE  Pharmacy Consult for Lovenox Indication: atrial fibrillation  Patient Measurements: Height: 5\' 1"  (154.9 cm) Weight: 81.7 kg (180 lb 1.9 oz) IBW/kg (Calculated) : 47.8  Labs: Recent Labs    01/22/21 0500 01/23/21 0849 01/24/21 0750  HGB 15.9* 14.2  --   HCT 48.3* 44.2  --   PLT 122* 142*  --   CREATININE 1.16* 1.48* 1.38*     Estimated Creatinine Clearance: 31 mL/min (A) (by C-G formula based on SCr of 1.38 mg/dL (H)).   Medical History: Past Medical History:  Diagnosis Date   Anxiety    Aortic insufficiency    Trivial   Arthritis    Asthma    Bronchitis    Bursitis, hip    Claustrophobia    Claustrophobia    Dysrhythmia    a fib   GERD (gastroesophageal reflux disease)    history of   Gout    Hyperlipidemia    Hypertension    Hypothyroidism    Mitral regurgitation    Obesity    Panic attacks    Paroxysmal atrial fibrillation (HCC)    PTSD (post-traumatic stress disorder)    Rhinitis    Sick sinus syndrome (HCC)    Sleep apnea    was ordered CPAP, but never fitted for one years ago, Dr has retired    Medications:  Apixaban 5 mg BID prior to admission (last patient reported dose 11/22 AM)  Assessment: Patient is an 80 y/o F with medical history including Afib on Eliquis who is admitted with acute respiratory failure secondary to COVID-19 pneumonia and AKI on CKD. Patient is currently intubated, sedated, and on mechanical ventilation in the ICU. Home Eliquis on hold. Pharmacy consulted for Lovenox dosing for Afib.     Plan:  --Lovenox 82.5 mg (1 mg/kg) q12h --CBC at least every 3 days per protocol --Continue to monitor renal function and adjust dosing as indicated  Tawnya Crook, PharmD, BCPS Clinical Pharmacist 01/24/2021 9:26 AM

## 2021-01-24 NOTE — Consult Note (Signed)
PHARMACY CONSULT NOTE  Pharmacy Consult for Electrolyte Monitoring and Replacement   Recent Labs: Potassium (mmol/L)  Date Value  01/24/2021 3.6   Magnesium (mg/dL)  Date Value  01/24/2021 2.4   Calcium (mg/dL)  Date Value  01/24/2021 8.4 (L)   Albumin (g/dL)  Date Value  01/23/2021 2.7 (L)  03/07/2017 4.4   Phosphorus (mg/dL)  Date Value  01/24/2021 3.7   Sodium (mmol/L)  Date Value  01/24/2021 149 (H)  04/22/2019 140   Assessment: Patient is an 80 y/o F with medical history including hypothyroidism, HLD, obesity, HTN, sick sinus syndrome s/p pacemaker, GERD, Afib on Eliquis, CKD who is admitted with respiratory distress in setting of known COVID-19 infection diagnosed two weeks prior to presentation. Pharmacy consulted to assist with electrolyte monitoring and replacement as indicated.  Goal of Therapy:  Electrolytes within normal limits  Plan:  --No replacement indicated --Continue to follow along with team  Tawnya Crook, PharmD, BCPS Clinical Pharmacist 01/24/2021 9:48 AM

## 2021-01-24 NOTE — Progress Notes (Signed)
PT Cancellation Note  Patient Details Name: [name] MRN: [id number] DOB: 30-Aug-1940   Cancelled Treatment:    Reason Eval/Treat Not Completed: Medical issues which prohibited therapy.  Upon chart review this AM, noted that pt was intubated on 11/26 and subsequently extubated on 11/27. Also noted that OT order was completed by critical care NP as pt had transitioned to comfort measures last night 11/27. As of this AM (11/28) it appears pt has transitioned back to full scope of care while maintaining DNR/DNI.  Should the care team determine that pt is appropriate to resume therapy services, will require new order. Thank you.  Yulissa Needham H. Owens Shark, PT, DPT, NCS 01/24/21, 9:08 AM [phone]

## 2021-01-24 NOTE — Progress Notes (Signed)
GOALS OF CARE DISCUSSION  The Clinical status was relayed to family in detail. Daughter over the phone   Updated and notified of patients medical condition.  Patient remains unresponsive and will not open eyes to command.   Patient is having a weak cough and struggling to remove secretions.   Patient with increased WOB and using accessory muscles to breathe Explained to family course of therapy and the modalities   Patient with Progressive multiorgan failure with a very high probablity of a very minimal chance of meaningful recovery despite all aggressive and optimal medical therapy.   Patient is suffering and suffocating and struggling to breathe Daughter understands and will visit  tonight  Patient remains DNR/DNI status  Family are satisfied with Plan of action and management. All questions answered  Additional CC time 35 mins   Armond Cuthrell Patricia Pesa, M.D.  [facility]  Medical Director [place] Director [facility] Cardio-Pulmonary Department

## 2021-01-24 NOTE — Progress Notes (Signed)
Dr. Lanney Gins discussed the plan of care with the patient's daughter, [name], and [name] confirmed she would like to terminate Comfort measures and transition back to DNR/DNI and reassume previous plan of care.  Orders adjusted. All questions and concerns answered at this time.    Domingo Pulse [place], AGACNP-BC Acute Care Nurse Practitioner [place] [facility]   [phone] / [phone] Please see Amion for pager details.

## 2021-01-24 NOTE — Progress Notes (Signed)
OT Cancellation Note  Patient Details Name: [name] MRN: [id number] DOB: 1940/12/12   Cancelled Treatment:    Reason Eval/Treat Not Completed: Other (comment) Upon chart review this AM, noted that pt was intubated on 11/26 and subsequently extubated on 11/27. Also noted that OT order was completed by critical care NP as pt had transitioned to comfort measures last night 11/27. As of this AM (11/28) it appears pt has transitioned back to full scope of care while maintaining DNR/DNI.  Should the care team determine that pt is appropriate to resume therapy services, will require new order. Thank you.  Gerrianne Scale, [place], OTR/L ascom [phone] 01/24/21, 8:35 AM

## 2021-01-24 NOTE — Progress Notes (Signed)
CRITICAL CARE PROGRESS NOTE    Patient description:  80 y.o with significant PMH of chronic atrial fibrillation on Eliquis, sick sinus syndrome s/p pacemaker, HTN, HLD, CKD OSA not on CPAP, hypothyroidism and bilateral chronic hip pain who presented to the ED with chief complaints of progressive shortness of breath.   Patient developed fevers, chills, generalized weakness, cough, lethargy, and dyspnea several days before presenting to the ED. Patient and her husband took a home COVID test on 01/10/21 which came back positive. Per daughter, her primary medical doctor started her on Paxlovid, Azithromycin and Prednisone x 5 days with no improvement. Patient continued to have progressive dyspnea, refused po intake and meds due to lack of appetite and lethargy. Patient's symptoms worsened  prompting her daughter to call EMS. On EMS arrival, patient was found to be hypoxic with sats in the 50's on R.A. She was placed on 15L/min via NRB and transported to the ED.  Patient was placed on BiPAP due to significant dyspnea with isolation precautions for COVID-19. She was started on Remdesivir and steroids. PCCM consulted for admission and further management.  01/20/21- patient s/p CTPE no PE but does show bilateral GGO infiltrates consistent with COVID19 01/21/21- patient with severe anxiety, she has been bedridden since admission with atelectasis. Has not slept in 2 days she states. I spoke with daughter. PT/OT and OOB to chair today. Shes higher risk for falls ordered 1:1.  We started BIPAP today with precedex. Updated daughter in person.  01/22/21- patient had respiratory distress and became unresponsive with spO2<60%.  We spoke to daughter immediately and Tammy reviewed goals of care highlighted full code measures and instructed to proceed  with ETT and MV temporarily since patient clearly stated she does not whish to die and is not ready to give up medically.  RN Jonelle Sidle Fairing confirmed codes status during [place] conference. AF is rate controlled.   01/23/21-  had meeting with daughter today, plan for extubation without re-intubation as per patients wishes. She is doing ok on HFNC and precedex with spo2 >88% and non-tachycardic.   11/28 severe encephalopathy and hypoxia, Limited CODE status   Lines/tubes : External Urinary Catheter (Active)  Collection Container Dedicated Suction Canister 01/20/21 0800  Suction (Verified suction is between 40-80 mmHg) Yes 01/20/21 0800  Securement Method Leg strap 01/19/21 0700  Site Assessment Clean;Intact 01/20/21 0800  Intervention Female External Urinary Catheter Replaced 01/20/21 0800  Output (mL) 150 mL 01/20/21 1218    Microbiology/Sepsis markers: Results for orders placed or performed during the hospital encounter of 01/25/2021  Blood culture (single)     Status: None   Collection Time: 01/07/2021  7:26 PM   Specimen: BLOOD  Result Value Ref Range Status   Specimen Description BLOOD RIGHT ANTECUBITAL  Final   Special Requests   Final    BOTTLES DRAWN AEROBIC AND ANAEROBIC Blood Culture adequate volume   Culture   Final    NO GROWTH 5 DAYS Performed at [facility], [address] [place], [place] [address]    Report Status 01/23/2021 FINAL  Final  Resp Panel by RT-PCR (Flu A&B, Covid) Nasopharyngeal Swab     Status: Abnormal   Collection Time: 01/21/2021  8:52 PM   Specimen: Nasopharyngeal Swab; Nasopharyngeal(NP) swabs in vial transport medium  Result Value Ref Range Status   SARS Coronavirus 2 by RT PCR POSITIVE (A) NEGATIVE Final    Comment: RESULT CALLED TO, READ BACK BY AND VERIFIED WITHToni Arthurs RN 2252 01/17/2021  HNM (NOTE) SARS-CoV-2 target nucleic acids are DETECTED.  The SARS-CoV-2 RNA is generally detectable in upper respiratory specimens during the  acute phase of infection. Positive results are indicative of the presence of the identified virus, but do not rule out bacterial infection or co-infection with other pathogens not detected by the test. Clinical correlation with patient history and other diagnostic information is necessary to determine patient infection status. The expected result is Negative.  Fact Sheet for Patients: EntrepreneurPulse.com.au  Fact Sheet for Healthcare Providers: IncredibleEmployment.be  This test is not yet approved or cleared by the [place] FDA and  has been authorized for detection and/or diagnosis of SARS-CoV-2 by FDA under an Emergency Use Authorization (EUA).  This EUA will remain in effect (meaning this test can b e used) for the duration of  the COVID-19 declaration under Section 564(b)(1) of the Act, 21 U.S.C. section 360bbb-3(b)(1), unless the authorization is terminated or revoked sooner.     Influenza A by PCR NEGATIVE NEGATIVE Final   Influenza B by PCR NEGATIVE NEGATIVE Final    Comment: (NOTE) The Xpert Xpress SARS-CoV-2/FLU/RSV plus assay is intended as an aid in the diagnosis of influenza from Nasopharyngeal swab specimens and should not be used as a sole basis for treatment. Nasal washings and aspirates are unacceptable for Xpert Xpress SARS-CoV-2/FLU/RSV testing.  Fact Sheet for Patients: EntrepreneurPulse.com.au  Fact Sheet for Healthcare Providers: IncredibleEmployment.be  This test is not yet approved or cleared by the [place] FDA and has been authorized for detection and/or diagnosis of SARS-CoV-2 by FDA under an Emergency Use Authorization (EUA). This EUA will remain in effect (meaning this test can be used) for the duration of the COVID-19 declaration under Section 564(b)(1) of the Act, 21 U.S.C. section 360bbb-3(b)(1), unless the authorization is terminated or revoked.  Performed at  [facility], [address] [place], [place] [address]   MRSA Next Gen by PCR, Nasal     Status: Abnormal   Collection Time: 01/23/2021 11:14 PM   Specimen: Nasal Mucosa; Nasal Swab  Result Value Ref Range Status   MRSA by PCR Next Gen DETECTED (A) NOT DETECTED Final    Comment: RESULT CALLED TO, READ BACK BY AND VERIFIED WITH: JUSTIN ODELL @0139  ON 01/19/21 SKL (NOTE) The GeneXpert MRSA Assay (FDA approved for NASAL specimens only), is one component of a comprehensive MRSA colonization surveillance program. It is not intended to diagnose MRSA infection nor to guide or monitor treatment for MRSA infections. Test performance is not FDA approved in patients less than 25 years old. Performed at [facility], [address] [place], [place] [address]   Culture, blood (routine x 2)     Status: None   Collection Time: 01/21/2021 11:51 PM   Specimen: BLOOD LEFT FOREARM  Result Value Ref Range Status   Specimen Description BLOOD LEFT FOREARM  Final   Special Requests   Final    BOTTLES DRAWN AEROBIC AND ANAEROBIC Blood Culture adequate volume   Culture   Final    NO GROWTH 5 DAYS Performed at [facility], [address], [place], [place] [address]    Report Status 01/24/2021 FINAL  Final  Culture, Respiratory w Gram Stain     Status: None (Preliminary result)   Collection Time: 01/23/21  7:59 AM   Specimen: Tracheal Aspirate; Respiratory  Result Value Ref Range Status   Specimen Description   Final    TRACHEAL ASPIRATE Performed at [facility], [address],  [place], [place]  [address]    Special Requests   Final    NONE Performed at [facility], [address] [place], [place] [address]    Gram Stain   Final    ABUNDANT WBC PRESENT,BOTH PMN AND MONONUCLEAR RARE YEAST    Culture   Final    CULTURE REINCUBATED FOR BETTER GROWTH Performed at [facility], [place] [address] [place], [place] [address]    Report Status  PENDING  Incomplete    Anti-infectives:  Anti-infectives (From admission, onward)    Start     Dose/Rate Route Frequency Ordered Stop   01/24/21 1000  doxycycline (VIBRAMYCIN) 100 mg in sodium chloride 0.9 % 250 mL IVPB        100 mg 125 mL/hr over 120 Minutes Intravenous Every 12 hours 01/24/21 0655 01/28/21 0959   01/22/21 2300  doxycycline (VIBRAMYCIN) 100 mg in sodium chloride 0.9 % 250 mL IVPB  Status:  Discontinued        100 mg 125 mL/hr over 120 Minutes Intravenous Every 12 hours 01/22/21 2221 01/23/21 2354   01/22/21 2200  doxycycline (VIBRA-TABS) tablet 100 mg  Status:  Discontinued        100 mg Per Tube Every 12 hours 01/22/21 1553 01/22/21 2221   01/21/21 1530  doxycycline (VIBRAMYCIN) 100 mg in sodium chloride 0.9 % 250 mL IVPB  Status:  Discontinued        100 mg 125 mL/hr over 120 Minutes Intravenous 2 times daily 01/21/21 1440 01/22/21 1553   01/19/21 1315  hydroxychloroquine (PLAQUENIL) tablet 200 mg  Status:  Discontinued        200 mg Oral 2 times daily 01/19/21 1215 01/22/21 0913   01/19/21 1000  remdesivir 100 mg in sodium chloride 0.9 % 100 mL IVPB       See Hyperspace for full Linked Orders Report.   100 mg 200 mL/hr over 30 Minutes Intravenous Daily 01/06/2021 2202 01/23/21 0717   01/19/21 0200  cefTRIAXone (ROCEPHIN) 1 g in sodium chloride 0.9 % 100 mL IVPB  Status:  Discontinued        1 g 200 mL/hr over 30 Minutes Intravenous Every 24 hours 01/19/21 0100 01/22/21 0913   01/19/21 0200  azithromycin (ZITHROMAX) 500 mg in sodium chloride 0.9 % 250 mL IVPB  Status:  Discontinued        500 mg 250 mL/hr over 60 Minutes Intravenous Every 24 hours 01/19/21 0100 01/21/21 1440   01/04/2021 2215  remdesivir 200 mg in sodium chloride 0.9% 250 mL IVPB       See Hyperspace for full Linked Orders Report.   200 mg 580 mL/hr over 30 Minutes Intravenous Once 01/09/2021 2202 01/19/21 0700         MEDICATIONS   Current Medication:  Current Facility-Administered  Medications:    albuterol (PROVENTIL) (2.5 MG/3ML) 0.083% nebulizer solution 2.5 mg, 2.5 mg, Nebulization, Q6H, Rust-Chester, Britton L, NP, 2.5 mg at 01/24/21 9147   cholecalciferol (VITAMIN D3) tablet 2,000 Units, 2,000 Units, Oral, Daily, Rust-Chester, Britton L, NP   dexamethasone (DECADRON) injection 6 mg, 6 mg, Intravenous, Q12H, Rust-Chester, Britton L, NP, 6 mg at 01/24/21 1018   dexmedetomidine (PRECEDEX) 400 MCG/100ML (4 mcg/mL) infusion, 0.4-1.2 mcg/kg/hr, Intravenous, Titrated, Aleskerov, Fuad, MD, Last Rate: 24.5 mL/hr at 01/24/21 0716, 1.2 mcg/kg/hr at 01/24/21 0716   digoxin (LANOXIN) 0.25 MG/ML injection 0.125 mg, 0.125 mg, Intravenous, Daily, Rust-Chester, Britton L, NP, 0.125 mg at 01/24/21 1016   docusate sodium (COLACE)  capsule 100 mg, 100 mg, Oral, BID PRN, Rust-Chester, Toribio Harbour L, NP   doxycycline (VIBRAMYCIN) 100 mg in sodium chloride 0.9 % 250 mL IVPB, 100 mg, Intravenous, Q12H, Rust-Chester, Britton L, NP, Last Rate: 125 mL/hr at 01/24/21 1033, 100 mg at 01/24/21 1033   enoxaparin (LOVENOX) injection 82.5 mg, 1 mg/kg, Subcutaneous, Q12H, Ellington, Abby K, RPH   MEDLINE mouth rinse, 15 mL, Mouth Rinse, 10 times per day, Ottie Glazier, MD, 15 mL at 01/24/21 1019   morphine 2 MG/ML injection 2 mg, 2 mg, Intravenous, Q2H PRN, Rust-Chester, Toribio Harbour L, NP, 2 mg at 01/24/21 1021   polyethylene glycol (MIRALAX / GLYCOLAX) packet 17 g, 17 g, Oral, Daily PRN, Rust-Chester, Toribio Harbour L, NP    ALLERGIES   Contrast media [iodinated diagnostic agents]    PHYSICAL EXAMINATION   Vital Signs: Temp:  [98.6 F (37 C)-99.2 F (37.3 C)] 98.7 F (37.1 C) (11/27 2000) Pulse Rate:  [65-127] 70 (11/28 0500) Resp:  [12-31] 14 (11/28 0500) BP: (61-155)/(39-98) 134/78 (11/28 0400) SpO2:  [86 %-98 %] 95 % (11/28 0748) FiO2 (%):  [50 %-100 %] 100 % (11/28 0748)    REVIEW OF SYSTEMS  PATIENT IS UNABLE TO PROVIDE COMPLETE REVIEW OF SYSTEMS DUE TO SEVERE CRITICAL ILLNESS AND TOXIC  METABOLIC ENCEPHALOPATHY    PHYSICAL EXAMINATION:  GENERAL:critically ill appearing, +resp distress EYES: Pupils equal, round, reactive to light.  No scleral icterus.  MOUTH: Moist mucosal membrane. INTUBATED NECK: Supple.  PULMONARY: +rhonchi,  CARDIOVASCULAR: S1 and S2.  No murmurs  GASTROINTESTINAL: Soft, nontender, -distended. Positive bowel sounds.  MUSCULOSKELETAL: No swelling, clubbing, or edema.  NEUROLOGIC: obtunded SKIN:intact,warm,dry     PERTINENT DATA     Infusions:  dexmedetomidine (PRECEDEX) IV infusion 1.2 mcg/kg/hr (01/24/21 0716)   doxycycline (VIBRAMYCIN) IV 100 mg (01/24/21 1033)   Scheduled Medications:  albuterol  2.5 mg Nebulization Q6H   cholecalciferol  2,000 Units Oral Daily   dexamethasone (DECADRON) injection  6 mg Intravenous Q12H   digoxin  0.125 mg Intravenous Daily   enoxaparin (LOVENOX) injection  1 mg/kg Subcutaneous Q12H   mouth rinse  15 mL Mouth Rinse 10 times per day   PRN Medications: docusate sodium, morphine injection, polyethylene glycol Hemodynamic parameters:   Intake/Output: 11/27 0701 - 11/28 0700 In: 1921 [I.V.:1089.6; NG/GT:60; IV Piggyback:446.4] Out: 2375 [Urine:2375]  Ventilator  Settings: Vent Mode: PCV FiO2 (%):  [50 %-100 %] 100 % Set Rate:  [18 bmp] 18 bmp PEEP:  [5 cmH20] 5 cmH20 Pressure Support:  [8 cmH20] 8 cmH20   LAB RESULTS:  Basic Metabolic Panel: Recent Labs  Lab 01/20/21 0323 01/21/21 0547 01/22/21 0500 01/23/21 0849 01/24/21 0750  NA 142 142 147* 149* 149*  K 4.2 4.5 4.2 4.2 3.6  CL 112* 116* 113* 117* 113*  CO2 23 18* 22 24 26   GLUCOSE 154* 110* 119* 132* 190*  BUN 54* 49* 51* 63* 69*  CREATININE 1.00 0.97 1.16* 1.48* 1.38*  CALCIUM 8.6* 8.3* 8.6* 8.1* 8.4*  MG 2.2 1.9 2.1 2.2 2.4  PHOS 3.2 2.8 4.8* 5.1* 3.7    Liver Function Tests: Recent Labs  Lab 01/19/21 0313 01/20/21 0323 01/21/21 0547 01/22/21 0500 01/23/21 0849  AST 36 37 47* 53* 26  ALT 21 23 22 31 25   ALKPHOS  52 60 65 81 66  BILITOT 0.9 0.9 1.0 1.4* 1.0  PROT 5.9* 6.6 5.9* 6.9 6.2*  ALBUMIN 2.4* 2.7* 2.5* 2.7* 2.7*    No results for input(s): LIPASE, AMYLASE in the  last 168 hours. No results for input(s): AMMONIA in the last 168 hours. CBC: Recent Labs  Lab 01/19/21 0313 01/20/21 0323 01/21/21 0547 01/22/21 0500 01/23/21 0849 01/24/21 0750  WBC 4.2 9.0 10.5 13.3* 21.3* 10.2  NEUTROABS 3.7 7.9* 9.7* 12.2* 20.1*  --   HGB 13.2 14.0 14.5 15.9* 14.2 14.2  HCT 39.0 41.4 42.7 48.3* 44.2 42.5  MCV 94.7 94.5 94.3 95.6 98.9 94.9  PLT 142* 164 140* 122* 142* 94*    Cardiac Enzymes: No results for input(s): CKTOTAL, CKMB, CKMBINDEX, TROPONINI in the last 168 hours. BNP: Invalid input(s): POCBNP CBG: Recent Labs  Lab 01/23/21 1134 01/23/21 1541 01/23/21 2009 01/23/21 2328 01/24/21 0806  GLUCAP 123* 125* 144* 160* 171*        IMAGING RESULTS:  Imaging: DG Chest Port 1 View  Result Date: 01/23/2021 CLINICAL DATA:  Follow-up respiratory distress. Reported pulmonary edema and COVID-19 positive. EXAM: PORTABLE CHEST 1 VIEW COMPARISON:  01/23/2021 at 4:42 a.m., and older exams. FINDINGS: Left pneumothorax, estimated at 15%, without change from the earlier exam. Minimal right apical pneumothorax is less well-defined on the current study, but without convincing change. Bilateral interstitial and hazy airspace lung opacities are unchanged. Since the prior study the endotracheal tube and nasal/orogastric tube have been removed. IMPRESSION: 1. Status post removal of the endotracheal tube and nasal/orogastric tube. 2. Persistent small left and minimal right apical pneumothoraces, unchanged from the exam obtained earlier today. 3. No change in the bilateral interstitial and airspace lung opacities since the most recent prior study. Electronically Signed   By: Lajean Manes M.D.   On: 01/23/2021 17:18   DG Chest Port 1 View  Result Date: 01/23/2021 CLINICAL DATA:  Difficulty breathing EXAM:  PORTABLE CHEST 1 VIEW COMPARISON:  Previous studies including the examination of 01/22/2021 FINDINGS: Transverse diameter of heart is slightly increased. Tip of endotracheal tube is 3.5 cm above the carina. Tip NG tube is seen in the fundus of the stomach. Left hemidiaphragm is elevated. There is increase in size of left apical pneumothorax. There is decrease in right apical pneumothorax. There is interval decrease in subcutaneous emphysema. Small amount of pneumomediastinum is seen. Pacemaker battery is seen in the left infraclavicular region. There is slight improvement in aeration in both lungs with slight decrease in extensive infiltrates in both lungs. Lateral CP angles are indistinct. IMPRESSION: There is increase in size of left apical pneumothorax and decrease in size of small right apical pneumothorax. There is interval improvement in the aeration in both lungs suggesting resolving pneumonia. There is interval decrease in subcutaneous emphysema. Electronically Signed   By: Elmer Picker M.D.   On: 01/23/2021 08:11   @PROBHOSP @ DG Chest Port 1 View  Result Date: 01/23/2021 CLINICAL DATA:  Follow-up respiratory distress. Reported pulmonary edema and COVID-19 positive. EXAM: PORTABLE CHEST 1 VIEW COMPARISON:  01/23/2021 at 4:42 a.m., and older exams. FINDINGS: Left pneumothorax, estimated at 15%, without change from the earlier exam. Minimal right apical pneumothorax is less well-defined on the current study, but without convincing change. Bilateral interstitial and hazy airspace lung opacities are unchanged. Since the prior study the endotracheal tube and nasal/orogastric tube have been removed. IMPRESSION: 1. Status post removal of the endotracheal tube and nasal/orogastric tube. 2. Persistent small left and minimal right apical pneumothoraces, unchanged from the exam obtained earlier today. 3. No change in the bilateral interstitial and airspace lung opacities since the most recent prior study.  Electronically Signed   By: Dedra Skeens.D.  On: 01/23/2021 17:18         ASSESSMENT AND PLAN   80 yo white female admittedf or severe ARDS and COVID 19 infection and pneumonia with severe hypoxia and encephalopathy with acute cardiac strain and afib with RVR with AKI   Severe COVID-19 infection, ARDS and pneumonia/pneumonitis Tested + 11/14 treated with Paxlovid, Prednisone and Azithromycin x 5 days -Place on HHFNC, wean FiO2 as tolerated -extubated 11/27 to HFNC Continue IV steroids  Continue IV remdisivir Continue IV ABX Aggressive pulm toilet recommended Continue proning as tolerated due to severe hypoxia   Maintain airborne and contact precautions  As needed bronchodilators (MDI) Vitamin C and zinc Antitussives High risk for death   Partial SBO- RESOLVED    - CT abd with dialated loops and Fecal material is noted within the rectal vault consistent with a degree of constipation - PATIENT HAD 2BMS - 01/21/21  ACUTE KIDNEY INJURY/Renal Failure AKI on CKD Stage III -continue Foley Catheter-assess need -Avoid nephrotoxic agents -Follow urine output, BMP -Ensure adequate renal perfusion, optimize oxygenation -Renal dose medications   Intake/Output Summary (Last 24 hours) at 01/24/2021 1102 Last data filed at 01/24/2021 0516 Gross per 24 hour  Intake 1719.87 ml  Output 2175 ml  Net -455.13 ml       Chronic Atrial Fibrlilation       -on full lovenox Hx: SSS s/p pacemaker Thromboembolic risk factors ( age  -2, HTN-1, Gender-1) for a CHADSVASc Score of >=4       Best practice:  Diet:  NPO Pain/Anxiety/Delirium protocol (if indicated): No VAP protocol (if indicated): Not indicated DVT prophylaxis: Subcutaneous Heparin GI prophylaxis: H2B Glucose control:  SSI No Central venous access:  N/A Arterial line:  N/A Foley:  N/A Mobility:  bed rest  PT consulted: N/A Daily multidisciplinary goals of care discussion  Code Status:  limited Disposition:  ICU     = Goals of Care = Code Status Order: LIMITED CODE  Primary Emergency Contact: [place], Home Phone: [phone] Discussed code status with patient's Husband and Duaghter Tammy. They confirm that patient wishes to pursue full aggressive treatment and intervention options, including CPR/ACLS Drugs and Defibrillation EXCEPT intubation, goals of care will be addressed on going with family if that should become necessary.  Family would like to be immediately contacted in such situations.     DVT/GI PRX  assessed I Assessed the need for Labs I Assessed the need for Foley I Assessed the need for Central Venous Line Family Discussion when available I Assessed the need for Mobilization I made an Assessment of medications to be adjusted accordingly Safety Risk assessment completed  CASE DISCUSSED IN MULTIDISCIPLINARY ROUNDS WITH ICU TEAM     Critical Care Time devoted to patient care services described in this note is 55  minutes.  Critical care was necessary to treat /prevent imminent and life-threatening deterioration. Overall, patient is critically ill, prognosis is guarded.  Patient with Multiorgan failure and at high risk for cardiac arrest and death.    Corrin Parker, M.D.  [facility]  Medical Director [facility] Cardio-P[facility] Department

## 2021-01-25 DIAGNOSIS — J9601 Acute respiratory failure with hypoxia: Secondary | ICD-10-CM

## 2021-01-25 DIAGNOSIS — R0602 Shortness of breath: Secondary | ICD-10-CM

## 2021-01-25 DIAGNOSIS — Z515 Encounter for palliative care: Secondary | ICD-10-CM

## 2021-01-25 DIAGNOSIS — U071 COVID-19: Secondary | ICD-10-CM | POA: Diagnosis not present

## 2021-01-25 DIAGNOSIS — Z66 Do not resuscitate: Secondary | ICD-10-CM

## 2021-01-25 DIAGNOSIS — J80 Acute respiratory distress syndrome: Secondary | ICD-10-CM

## 2021-01-25 DIAGNOSIS — Z7189 Other specified counseling: Secondary | ICD-10-CM

## 2021-01-25 LAB — CBC
HCT: 46.1 % — ABNORMAL HIGH (ref 36.0–46.0)
Hemoglobin: 15.4 g/dL — ABNORMAL HIGH (ref 12.0–15.0)
MCH: 31.4 pg (ref 26.0–34.0)
MCHC: 33.4 g/dL (ref 30.0–36.0)
MCV: 94.1 fL (ref 80.0–100.0)
Platelets: 96 10*3/uL — ABNORMAL LOW (ref 150–400)
RBC: 4.9 MIL/uL (ref 3.87–5.11)
RDW: 14.5 % (ref 11.5–15.5)
WBC: 13.6 10*3/uL — ABNORMAL HIGH (ref 4.0–10.5)
nRBC: 0 % (ref 0.0–0.2)

## 2021-01-25 LAB — BASIC METABOLIC PANEL
Anion gap: 9 (ref 5–15)
BUN: 72 mg/dL — ABNORMAL HIGH (ref 8–23)
CO2: 23 mmol/L (ref 22–32)
Calcium: 8.3 mg/dL — ABNORMAL LOW (ref 8.9–10.3)
Chloride: 119 mmol/L — ABNORMAL HIGH (ref 98–111)
Creatinine, Ser: 1.23 mg/dL — ABNORMAL HIGH (ref 0.44–1.00)
GFR, Estimated: 44 mL/min — ABNORMAL LOW (ref >60)
Glucose, Bld: 136 mg/dL — ABNORMAL HIGH (ref 70–99)
Potassium: 4.1 mmol/L (ref 3.5–5.1)
Sodium: 151 mmol/L — ABNORMAL HIGH (ref 135–145)

## 2021-01-25 LAB — GLUCOSE, CAPILLARY
Glucose-Capillary: 133 mg/dL — ABNORMAL HIGH (ref 70–99)
Glucose-Capillary: 137 mg/dL — ABNORMAL HIGH (ref 70–99)
Glucose-Capillary: 146 mg/dL — ABNORMAL HIGH (ref 70–99)
Glucose-Capillary: 146 mg/dL — ABNORMAL HIGH (ref 70–99)
Glucose-Capillary: 168 mg/dL — ABNORMAL HIGH (ref 70–99)

## 2021-01-25 LAB — PHOSPHORUS: Phosphorus: 2.6 mg/dL (ref 2.5–4.6)

## 2021-01-25 LAB — MAGNESIUM: Magnesium: 2.5 mg/dL — ABNORMAL HIGH (ref 1.7–2.4)

## 2021-01-25 MED ORDER — SODIUM CHLORIDE 0.9 % IV SOLN: 250.0000 mL | INTRAVENOUS | Status: DC

## 2021-01-25 MED ORDER — PHENYLEPHRINE HCL-NACL 20-0.9 MG/250ML-% IV SOLN
25.0000 ug/min | INTRAVENOUS | Status: DC
  Administered 2021-01-25: 25 ug/min via INTRAVENOUS
  Filled 2021-01-25: qty 250

## 2021-01-25 MED ORDER — MORPHINE SULFATE (PF) 2 MG/ML IV SOLN
2.0000 mg | INTRAVENOUS | Status: DC | PRN
  Administered 2021-01-25 (×2): 2 mg via INTRAVENOUS

## 2021-01-25 MED ORDER — MORPHINE BOLUS VIA INFUSION
1.0000 mg | INTRAVENOUS | Status: DC | PRN
  Administered 2021-01-25 – 2021-01-26 (×8): 1 mg via INTRAVENOUS
  Filled 2021-01-25: qty 1

## 2021-01-25 MED ORDER — MORPHINE 100MG IN NS 100ML (1MG/ML) PREMIX INFUSION
1.0000 mg/h | INTRAVENOUS | Status: DC
  Administered 2021-01-25: 1 mg/h via INTRAVENOUS
  Administered 2021-01-26 – 2021-01-27 (×2): 10 mg/h via INTRAVENOUS
  Filled 2021-01-25 (×4): qty 100

## 2021-01-25 MED ORDER — ALBUTEROL SULFATE (2.5 MG/3ML) 0.083% IN NEBU: 2.5000 mg | INHALATION_SOLUTION | Freq: Four times a day (QID) | RESPIRATORY_TRACT | Status: DC | PRN

## 2021-01-25 NOTE — Progress Notes (Signed)
Patient responsive to pain,unable to follow commands. In Afibb rhythm, on heated high flow at 100%, sat in the 95s.  350 out per Foley. No bowel movement. Patient given PRN morphine for increase work of breathing, air hunger, and pain per MD orders.

## 2021-01-25 NOTE — Consult Note (Signed)
Consultation Note Date: 01/25/2021   Patient Name: [name]  DOB: 1940-06-17  MRN: [id number]  Age / Sex: 80 y.o., female  PCP: Bryson Corona, NP Referring Physician: Flora Lipps, MD  Reason for Consultation: Establishing goals of care  HPI/Patient Profile: 80 y.o. female  with past medical history of chronic A. fib (Eliquis), SSS status post pacemaker, HTN, HLD, CKD, OSA, hypothyroidism and bilateral chronic hip pain admitted on 01/07/2021 with shortness of breath.   During her hospitalization patient developed severe anxiety and respiratory distress.  Patient was intubated 11/26\ and then successfully extubated on 11/27.  On 11/28 patient developed severe encephalopathy with hypoxia.  Palliative medicine was consulted to discuss goals of care  Clinical Assessment and Goals of Care: I have reviewed medical records including EPIC notes, labs and imaging, assessed the patient and then met with patient and patient's daughter Tammy at bedside to discuss diagnosis prognosis, GOC, EOL wishes, disposition and options.  I introduced Palliative Medicine as specialized medical care for people living with serious illness. It focuses on providing relief from the symptoms and stress of a serious illness. The goal is to improve quality of life for both the patient and the family.  We discussed a brief life review of the patient.  She has her masters in education and has been married to her husband for over 73 years.  They have 1 child together and 2 grandchildren.  Tammy is her only child with whom she and her husband live.  Tammy endorses that the patient is very sweet and never had any complaints of any medical issues.  As far as functional and nutritional status prior to admission Tammy endorses patient was completely independent with ADLs.  She is primary caregiver of her husband who is battling post-COVID  symptoms and interstitial lung disease.  We discussed patient's current illness and what it means in the larger context of patient's on-going co-morbidities.  Natural disease trajectory and expectations at EOL were discussed.I attempted to elicit values and goals of care important to the patient.  Tammy does not want her mother to suffer but feels that her father needs to be present for a visit before moving to full comfort measures.  Tammy asked appropriate questions regarding prognosis and how much time she has left with her mother.  I shared that we are not moving to a full comfort pathway until she is ready to do so.  However, giving oxygen at such high levels as an artificial means of keeping her mother alive.  I shared that there is a careful balance between allowing family to come and visit further last moments with the patient and making sure the patient does not suffer or linger unnecessarily.  Tammy says she appreciates this and is moving as quickly as she can to get her father to the hospital.  His nebulizer machine is broken.  He is a patient of Dr. Lanney Gins.  I spoke with Dr. Lanney Gins who ordered that respiratory therapist can give Tammy what ever supplies necessary  for the patient's husband to get his nebulizer treatments and return for a visit with his dying wife at the hospital.  The difference between aggressive medical intervention and comfort care was considered in light of the patient's goals of care.  Tammy is moving towards a full comfort pathway.  However, she wants her father to visit with her mother and then make the decision to move forward with comfort.  Discussed with patient/family the importance of continued conversation with family and the medical providers regarding overall plan of care and treatment options, ensuring decisions are within the context of the patient's values and GOCs.    I shared that we would make no changes to the patient's plan of care except that we would  convert her IV morphine which she is getting quite frequently to a morphine drip to ensure better pain and air hunger control.  We discussed work of breathing and use of accessory muscles as signs of respiratory distress.  I assured Tammy that in addition to controlling the patient's pain that the morphine would help with the work of breathing and the load on the heart to beat faster to keep up with the body's requirements.  Tammy is in understanding of the use of morphine, as well as the Precedex to ensure her mother's comfort.  Tammy is not ready to move forward with comfort measures at this time.  Tammy and her father plan to visit as soon as possible at which point they will make a decision on when to move forward with full comfort measures.  Questions and concerns were addressed. The family was encouraged to call with questions or concerns.   Primary Decision Maker NEXT OF KIN  Code Status/Advance Care Planning: DNR Convert to Morphine GTT Do NOT move forward with comfort care until patient's husband is able to visit and give the final word to initiate comfort care  Prognosis:   Hours - Days  Discharge Planning: Anticipated Hospital Death  Primary Diagnoses: Present on Admission:  Acute respiratory failure due to COVID-19 [facility])   Physical Exam Constitutional:      General: She is in acute distress.     Appearance: She is ill-appearing and toxic-appearing.  HENT:     Head: Normocephalic.     Mouth/Throat:     Mouth: Mucous membranes are dry.     Pharynx: Oropharynx is clear.  Cardiovascular:     Rate and Rhythm: Rhythm irregular.  Pulmonary:     Effort: Respiratory distress present.  Abdominal:     Palpations: Abdomen is soft.  Musculoskeletal:     Comments: Generalized weakness  Skin:    Comments: Extremities cool to touch  Neurological:     Comments: Groaning, moaning    Vital Signs: BP 131/73   Pulse (!) 54   Temp 99 F (37.2 C) (Axillary)   Resp (!) 32   Ht  5' 1"  (1.549 m)   Wt 81.7 kg   SpO2 96%   BMI 34.03 kg/m  Pain Scale: CPOT POSS *See Group Information*: 2-Acceptable,Slightly drowsy, easily aroused Pain Score: 7  SpO2: SpO2: 96 % O2 Device:SpO2: 96 % O2 Flow Rate: .O2 Flow Rate (L/min): 60 L/min  Palliative Assessment/Data: 10%     I discussed this patient's plan of care with Dr. Mortimer Fries, nursing, patient, patient's daughter, Dr. Lanney Gins  Thank you for this consult. Palliative medicine will continue to follow and assist holistically.   Time Total: 70 minutes Greater than 50%  of this time was spent  counseling and coordinating care related to the above assessment and plan.  Signed by: Jordan Hawks, DNP, FNP-BC Palliative Medicine    Please contact Palliative Medicine Team phone at [phone] for questions and concerns.  For individual provider: See Shea Evans

## 2021-01-25 NOTE — Progress Notes (Signed)
CRITICAL CARE PROGRESS NOTE    Patient description:  80 y.o with significant PMH of chronic atrial fibrillation on Eliquis, sick sinus syndrome s/p pacemaker, HTN, HLD, CKD OSA not on CPAP, hypothyroidism and bilateral chronic hip pain who presented to the ED with chief complaints of progressive shortness of breath.   Patient developed fevers, chills, generalized weakness, cough, lethargy, and dyspnea several days before presenting to the ED. Patient and her husband took a home COVID test on 01/10/21 which came back positive. Per daughter, her primary medical doctor started her on Paxlovid, Azithromycin and Prednisone x 5 days with no improvement. Patient continued to have progressive dyspnea, refused po intake and meds due to lack of appetite and lethargy. Patient's symptoms worsened  prompting her daughter to call EMS. On EMS arrival, patient was found to be hypoxic with sats in the 50's on R.A. She was placed on 15L/min via NRB and transported to the ED.  Patient was placed on BiPAP due to significant dyspnea with isolation precautions for COVID-19. She was started on Remdesivir and steroids. PCCM consulted for admission and further management.  01/20/21- patient s/p CTPE no PE but does show bilateral GGO infiltrates consistent with COVID19 01/21/21- patient with severe anxiety, she has been bedridden since admission with atelectasis. Has not slept in 2 days she states. I spoke with daughter. PT/OT and OOB to chair today. Shes higher risk for falls ordered 1:1.  We started BIPAP today with precedex. Updated daughter in person.  01/22/21- patient had respiratory distress and became unresponsive with spO2<60%.  We spoke to daughter immediately and Tammy reviewed goals of care highlighted full code measures and instructed to proceed  with ETT and MV temporarily since patient clearly stated she does not whish to die and is not ready to give up medically.  RN Jonelle Sidle Fairing confirmed codes status during [place] conference. AF is rate controlled.   01/23/21-  had meeting with daughter today, plan for extubation without re-intubation as per patients wishes. She is doing ok on HFNC and precedex with spo2 >88% and non-tachycardic.   11/28 severe encephalopathy and hypoxia, Limited CODE status 11/29 suffering and suffocating and dying process   Lines/tubes : External Urinary Catheter (Active)  Collection Container Dedicated Suction Canister 01/20/21 0800  Suction (Verified suction is between 40-80 mmHg) Yes 01/20/21 0800  Securement Method Leg strap 01/19/21 0700  Site Assessment Clean;Intact 01/20/21 0800  Intervention Female External Urinary Catheter Replaced 01/20/21 0800  Output (mL) 150 mL 01/20/21 1218    Microbiology/Sepsis markers: Results for orders placed or performed during the hospital encounter of 01/20/2021  Blood culture (single)     Status: None   Collection Time: 01/24/2021  7:26 PM   Specimen: BLOOD  Result Value Ref Range Status   Specimen Description BLOOD RIGHT ANTECUBITAL  Final   Special Requests   Final    BOTTLES DRAWN AEROBIC AND ANAEROBIC Blood Culture adequate volume   Culture   Final    NO GROWTH 5 DAYS Performed at [facility], [address] [place], [place] [address]    Report Status 01/23/2021 FINAL  Final  Resp Panel by RT-PCR (Flu A&B, Covid) Nasopharyngeal Swab     Status: Abnormal   Collection Time: 12/29/2020  8:52 PM   Specimen: Nasopharyngeal Swab; Nasopharyngeal(NP) swabs in vial transport medium  Result Value Ref Range Status   SARS Coronavirus 2 by RT PCR POSITIVE (A) NEGATIVE Final    Comment: RESULT CALLED TO, READ BACK BY AND  VERIFIED WITHToni Arthurs RN [id number] 01/13/2021 HNM (NOTE) SARS-CoV-2 target nucleic acids are DETECTED.  The SARS-CoV-2 RNA is generally  detectable in upper respiratory specimens during the acute phase of infection. Positive results are indicative of the presence of the identified virus, but do not rule out bacterial infection or co-infection with other pathogens not detected by the test. Clinical correlation with patient history and other diagnostic information is necessary to determine patient infection status. The expected result is Negative.  Fact Sheet for Patients: EntrepreneurPulse.com.au  Fact Sheet for Healthcare Providers: IncredibleEmployment.be  This test is not yet approved or cleared by the [place] FDA and  has been authorized for detection and/or diagnosis of SARS-CoV-2 by FDA under an Emergency Use Authorization (EUA).  This EUA will remain in effect (meaning this test can b e used) for the duration of  the COVID-19 declaration under Section 564(b)(1) of the Act, 21 U.S.C. section 360bbb-3(b)(1), unless the authorization is terminated or revoked sooner.     Influenza A by PCR NEGATIVE NEGATIVE Final   Influenza B by PCR NEGATIVE NEGATIVE Final    Comment: (NOTE) The Xpert Xpress SARS-CoV-2/FLU/RSV plus assay is intended as an aid in the diagnosis of influenza from Nasopharyngeal swab specimens and should not be used as a sole basis for treatment. Nasal washings and aspirates are unacceptable for Xpert Xpress SARS-CoV-2/FLU/RSV testing.  Fact Sheet for Patients: EntrepreneurPulse.com.au  Fact Sheet for Healthcare Providers: IncredibleEmployment.be  This test is not yet approved or cleared by the [place] FDA and has been authorized for detection and/or diagnosis of SARS-CoV-2 by FDA under an Emergency Use Authorization (EUA). This EUA will remain in effect (meaning this test can be used) for the duration of the COVID-19 declaration under Section 564(b)(1) of the Act, 21 U.S.C. section 360bbb-3(b)(1), unless the  authorization is terminated or revoked.  Performed at [facility], [address] [place], [place] [address]   MRSA Next Gen by PCR, Nasal     Status: Abnormal   Collection Time: 01/05/2021 11:14 PM   Specimen: Nasal Mucosa; Nasal Swab  Result Value Ref Range Status   MRSA by PCR Next Gen DETECTED (A) NOT DETECTED Final    Comment: RESULT CALLED TO, READ BACK BY AND VERIFIED WITH: JUSTIN ODELL @0139  ON 01/19/21 SKL (NOTE) The GeneXpert MRSA Assay (FDA approved for NASAL specimens only), is one component of a comprehensive MRSA colonization surveillance program. It is not intended to diagnose MRSA infection nor to guide or monitor treatment for MRSA infections. Test performance is not FDA approved in patients less than 7 years old. Performed at [facility], [address] [place], [place] [address]   Culture, blood (routine x 2)     Status: None   Collection Time: 01/22/2021 11:51 PM   Specimen: BLOOD LEFT FOREARM  Result Value Ref Range Status   Specimen Description BLOOD LEFT FOREARM  Final   Special Requests   Final    BOTTLES DRAWN AEROBIC AND ANAEROBIC Blood Culture adequate volume   Culture   Final    NO GROWTH 5 DAYS Performed at [facility], [address] [place], [place] [address]    Report Status 01/24/2021 FINAL  Final  Culture, Respiratory w Gram Stain     Status: None (Preliminary result)   Collection Time: 01/23/21  7:59 AM   Specimen: Tracheal Aspirate; Respiratory  Result Value Ref Range Status   Specimen Description   Final    TRACHEAL ASPIRATE Performed at  [facility], [address] [place], [place] [address]    Special Requests   Final    NONE Performed at [facility], [address] [place], [place] [address]    Gram Stain   Final    ABUNDANT WBC PRESENT,BOTH PMN AND MONONUCLEAR RARE YEAST    Culture   Final    CULTURE REINCUBATED FOR BETTER GROWTH Performed at [facility],  [place] [address] [place], [place] [address]    Report Status PENDING  Incomplete    Anti-infectives:  Anti-infectives (From admission, onward)    Start     Dose/Rate Route Frequency Ordered Stop   01/24/21 1000  doxycycline (VIBRAMYCIN) 100 mg in sodium chloride 0.9 % 250 mL IVPB        100 mg 125 mL/hr over 120 Minutes Intravenous Every 12 hours 01/24/21 0655 01/28/21 0959   01/22/21 2300  doxycycline (VIBRAMYCIN) 100 mg in sodium chloride 0.9 % 250 mL IVPB  Status:  Discontinued        100 mg 125 mL/hr over 120 Minutes Intravenous Every 12 hours 01/22/21 2221 01/23/21 2354   01/22/21 2200  doxycycline (VIBRA-TABS) tablet 100 mg  Status:  Discontinued        100 mg Per Tube Every 12 hours 01/22/21 1553 01/22/21 2221   01/21/21 1530  doxycycline (VIBRAMYCIN) 100 mg in sodium chloride 0.9 % 250 mL IVPB  Status:  Discontinued        100 mg 125 mL/hr over 120 Minutes Intravenous 2 times daily 01/21/21 1440 01/22/21 1553   01/19/21 1315  hydroxychloroquine (PLAQUENIL) tablet 200 mg  Status:  Discontinued        200 mg Oral 2 times daily 01/19/21 1215 01/22/21 0913   01/19/21 1000  remdesivir 100 mg in sodium chloride 0.9 % 100 mL IVPB       See Hyperspace for full Linked Orders Report.   100 mg 200 mL/hr over 30 Minutes Intravenous Daily 01/17/2021 2202 01/23/21 0717   01/19/21 0200  cefTRIAXone (ROCEPHIN) 1 g in sodium chloride 0.9 % 100 mL IVPB  Status:  Discontinued        1 g 200 mL/hr over 30 Minutes Intravenous Every 24 hours 01/19/21 0100 01/22/21 0913   01/19/21 0200  azithromycin (ZITHROMAX) 500 mg in sodium chloride 0.9 % 250 mL IVPB  Status:  Discontinued        500 mg 250 mL/hr over 60 Minutes Intravenous Every 24 hours 01/19/21 0100 01/21/21 1440   01/15/2021 2215  remdesivir 200 mg in sodium chloride 0.9% 250 mL IVPB       See Hyperspace for full Linked Orders Report.   200 mg 580 mL/hr over 30 Minutes Intravenous Once 01/08/2021 2202 01/19/21 0700         MEDICATIONS    Current Medication:  Current Facility-Administered Medications:    0.9 %  sodium chloride infusion, 250 mL, Intravenous, Continuous, Rust-Chester, Britton L, NP   albuterol (PROVENTIL) (2.5 MG/3ML) 0.083% nebulizer solution 2.5 mg, 2.5 mg, Nebulization, Q6H, Rust-Chester, Britton L, NP, 2.5 mg at 01/25/21 0139   cholecalciferol (VITAMIN D3) tablet 2,000 Units, 2,000 Units, Oral, Daily, Rust-Chester, Britton L, NP   dexamethasone (DECADRON) injection 6 mg, 6 mg, Intravenous, Q12H, Rust-Chester, Britton L, NP, 6 mg at 01/24/21 2107   dexmedetomidine (PRECEDEX) 400 MCG/100ML (4 mcg/mL) infusion, 0.4-1.2 mcg/kg/hr, Intravenous, Titrated, Aleskerov, Fuad, MD, Last Rate: 24.5 mL/hr at 01/25/21 0515, 1.2 mcg/kg/hr at 01/25/21 0515   digoxin (LANOXIN)  0.25 MG/ML injection 0.125 mg, 0.125 mg, Intravenous, Daily, Rust-Chester, Britton L, NP, 0.125 mg at 01/24/21 1016   docusate sodium (COLACE) capsule 100 mg, 100 mg, Oral, BID PRN, Rust-Chester, Toribio Harbour L, NP   doxycycline (VIBRAMYCIN) 100 mg in sodium chloride 0.9 % 250 mL IVPB, 100 mg, Intravenous, Q12H, Rust-Chester, Britton L, NP, Stopped at 01/24/21 2317   enoxaparin (LOVENOX) injection 82.5 mg, 1 mg/kg, Subcutaneous, Q12H, Ellington, Abby K, RPH, 82.5 mg at 01/24/21 2106   MEDLINE mouth rinse, 15 mL, Mouth Rinse, 10 times per day, Ottie Glazier, MD, 15 mL at 01/25/21 0341   morphine 2 MG/ML injection 2 mg, 2 mg, Intravenous, Q2H PRN, Rust-Chester, Britton L, NP, 2 mg at 01/25/21 0114   phenylephrine (NEO-SYNEPHRINE) 20mg /NS 284mL premix infusion, 25-200 mcg/min, Intravenous, Titrated, Rust-Chester, Huel Cote, NP, Stopped at 01/25/21 0051   polyethylene glycol (MIRALAX / GLYCOLAX) packet 17 g, 17 g, Oral, Daily PRN, Rust-Chester, Toribio Harbour L, NP    ALLERGIES   Contrast media [iodinated diagnostic agents]    PHYSICAL EXAMINATION   Vital Signs: Temp:  [94.9 F (34.9 C)-99.1 F (37.3 C)] 98.9 F (37.2 C) (11/29 0400) Pulse Rate:  [58-83]  68 (11/29 0500) Resp:  [14-32] 19 (11/29 0500) BP: (75-130)/(49-80) 114/62 (11/29 0500) SpO2:  [78 %-98 %] 97 % (11/29 0500) FiO2 (%):  [99 %-100 %] 100 % (11/29 0139)   REVIEW OF SYSTEMS  PATIENT IS UNABLE TO PROVIDE COMPLETE REVIEW OF SYSTEMS DUE TO SEVERE CRITICAL ILLNESS AND TOXIC METABOLIC ENCEPHALOPATHY    PHYSICAL EXAMINATION:  GENERAL:critically ill appearing, +resp distress EYES: Pupils equal, round, reactive to light.  No scleral icterus.  MOUTH: Moist mucosal membrane.  NECK: Supple.  PULMONARY: +rhonchi, +wheezing CARDIOVASCULAR: S1 and S2.  No murmurs  GASTROINTESTINAL: Soft, nontender, -distended. Positive bowel sounds.  MUSCULOSKELETAL: No swelling, clubbing, or edema.  NEUROLOGIC: obtunded SKIN:intact,warm,dry    PERTINENT DATA     Infusions:  sodium chloride     dexmedetomidine (PRECEDEX) IV infusion 1.2 mcg/kg/hr (01/25/21 0515)   doxycycline (VIBRAMYCIN) IV Stopped (01/24/21 2317)   phenylephrine (NEO-SYNEPHRINE) Adult infusion Stopped (01/25/21 0051)   Scheduled Medications:  albuterol  2.5 mg Nebulization Q6H   cholecalciferol  2,000 Units Oral Daily   dexamethasone (DECADRON) injection  6 mg Intravenous Q12H   digoxin  0.125 mg Intravenous Daily   enoxaparin (LOVENOX) injection  1 mg/kg Subcutaneous Q12H   mouth rinse  15 mL Mouth Rinse 10 times per day   PRN Medications: docusate sodium, morphine injection, polyethylene glycol Hemodynamic parameters:   Intake/Output: 11/28 0701 - 11/29 0700 In: 1089.4 [I.V.:589.3; IV PPIRJJOAC:166] Out: 063 [Urine:530]  Ventilator  Settings: FiO2 (%):  [99 %-100 %] 100 %   LAB RESULTS:  Basic Metabolic Panel: Recent Labs  Lab 01/21/21 0547 01/22/21 0500 01/23/21 0849 01/24/21 0750 01/25/21 0403  NA 142 147* 149* 149* 151*  K 4.5 4.2 4.2 3.6 4.1  CL 116* 113* 117* 113* 119*  CO2 18* 22 24 26 23   GLUCOSE 110* 119* 132* 190* 136*  BUN 49* 51* 63* 69* 72*  CREATININE 0.97 1.16* 1.48* 1.38*  1.23*  CALCIUM 8.3* 8.6* 8.1* 8.4* 8.3*  MG 1.9 2.1 2.2 2.4 2.5*  PHOS 2.8 4.8* 5.1* 3.7 2.6    Liver Function Tests: Recent Labs  Lab 01/19/21 0313 01/20/21 0323 01/21/21 0547 01/22/21 0500 01/23/21 0849  AST 36 37 47* 53* 26  ALT 21 23 22 31 25   ALKPHOS 52 60 65 81 66  BILITOT 0.9 0.9  1.0 1.4* 1.0  PROT 5.9* 6.6 5.9* 6.9 6.2*  ALBUMIN 2.4* 2.7* 2.5* 2.7* 2.7*    No results for input(s): LIPASE, AMYLASE in the last 168 hours. No results for input(s): AMMONIA in the last 168 hours. CBC: Recent Labs  Lab 01/19/21 0313 01/20/21 0323 01/21/21 0547 01/22/21 0500 01/23/21 0849 01/24/21 0750 01/25/21 0403  WBC 4.2 9.0 10.5 13.3* 21.3* 10.2 13.6*  NEUTROABS 3.7 7.9* 9.7* 12.2* 20.1*  --   --   HGB 13.2 14.0 14.5 15.9* 14.2 14.2 15.4*  HCT 39.0 41.4 42.7 48.3* 44.2 42.5 46.1*  MCV 94.7 94.5 94.3 95.6 98.9 94.9 94.1  PLT 142* 164 140* 122* 142* 94* 96*    Cardiac Enzymes: No results for input(s): CKTOTAL, CKMB, CKMBINDEX, TROPONINI in the last 168 hours. BNP: Invalid input(s): POCBNP CBG: Recent Labs  Lab 01/23/21 2009 01/23/21 2328 01/24/21 0806 01/24/21 2050 01/25/21 0011  GLUCAP 144* 160* 171* 146* 146*        IMAGING RESULTS:  Imaging: DG Chest Port 1 View  Result Date: 01/23/2021 CLINICAL DATA:  Follow-up respiratory distress. Reported pulmonary edema and COVID-19 positive. EXAM: PORTABLE CHEST 1 VIEW COMPARISON:  01/23/2021 at 4:42 a.m., and older exams. FINDINGS: Left pneumothorax, estimated at 15%, without change from the earlier exam. Minimal right apical pneumothorax is less well-defined on the current study, but without convincing change. Bilateral interstitial and hazy airspace lung opacities are unchanged. Since the prior study the endotracheal tube and nasal/orogastric tube have been removed. IMPRESSION: 1. Status post removal of the endotracheal tube and nasal/orogastric tube. 2. Persistent small left and minimal right apical pneumothoraces,  unchanged from the exam obtained earlier today. 3. No change in the bilateral interstitial and airspace lung opacities since the most recent prior study. Electronically Signed   By: Lajean Manes M.D.   On: 01/23/2021 17:18   @PROBHOSP @ No results found.       ASSESSMENT AND PLAN   80 yo white female admittedf or severe ARDS and COVID 19 infection and pneumonia with severe hypoxia and encephalopathy with acute cardiac strain and afib with RVR with AKI   Severe COVID-19 infection, ARDS and pneumonia/pneumonitis Tested + 11/14 treated with Paxlovid, Prednisone and Azithromycin x 5 days -Place on HHFNC, wean FiO2 as tolerated -extubated 11/27 to HFNC Continue IV steroids  Continue IV remdisivir Maintain airborne and contact precautions  As needed bronchodilators (MDI) Vitamin C and zinc Antitussives High risk for death   Partial SBO- RESOLVED    - CT abd with dialated loops and Fecal material is noted within the rectal vault consistent with a degree of constipation - PATIENT HAD 2BMS - 01/21/21   ACUTE KIDNEY INJURY/Renal Failure -continue Foley Catheter-assess need -Avoid nephrotoxic agents -Follow urine output, BMP -Ensure adequate renal perfusion, optimize oxygenation -Renal dose medications   Intake/Output Summary (Last 24 hours) at 01/25/2021 0726 Last data filed at 01/25/2021 0515 Gross per 24 hour  Intake 1089.35 ml  Output 530 ml  Net 559.35 ml     Chronic Atrial Fibrlilation       -on full lovenox Hx: SSS s/p pacemaker Thromboembolic risk factors ( age  -2, HTN-1, Gender-1) for a CHADSVASc Score of >=4       Best practice:  Diet:  NPO Pain/Anxiety/Delirium protocol (if indicated): No VAP protocol (if indicated): Not indicated DVT prophylaxis: Subcutaneous Heparin GI prophylaxis: H2B Glucose control:  SSI No Central venous access:  N/A Arterial line:  N/A Foley:  N/A Mobility:  bed rest  PT consulted: N/A Daily multidisciplinary goals of care  discussion  Code Status:  limited Disposition: ICU     = Goals of Care = Code Status Order: LIMITED CODE  Primary Emergency Contact: Cove,Lonnie, Home Phone: [phone] Discussed code status with patient's Husband and Duaghter Tammy. They confirm that patient wishes to pursue full aggressive treatment and intervention options, including CPR/ACLS Drugs and Defibrillation EXCEPT intubation, goals of care will be addressed on going with family if that should become necessary.  Family would like to be immediately contacted in such situations.     DVT/GI PRX  assessed I Assessed the need for Labs I Assessed the need for Foley I Assessed the need for Central Venous Line Family Discussion when available I Assessed the need for Mobilization I made an Assessment of medications to be adjusted accordingly Safety Risk assessment completed  CASE DISCUSSED IN MULTIDISCIPLINARY ROUNDS WITH ICU TEAM   Critical Care Time devoted to patient care services described in this note is 55 minutes.  Critical care was necessary to treat /prevent imminent and life-threatening deterioration. Overall, patient is critically ill, prognosis is guarded.  Patient with Multiorgan failure and at high risk for cardiac arrest and death.    Corrin Parker, M.D.  [facility]  Medical Director [place] Director [facility] Cardio-Pulmonary Department

## 2021-01-25 NOTE — Progress Notes (Signed)
20g X 1.88 started by I.V. team remains in place for vasopressor protocol confirmed by ICU RN.

## 2021-01-26 DIAGNOSIS — U071 COVID-19: Secondary | ICD-10-CM | POA: Diagnosis not present

## 2021-01-26 DIAGNOSIS — Z7189 Other specified counseling: Secondary | ICD-10-CM | POA: Diagnosis not present

## 2021-01-26 DIAGNOSIS — J9600 Acute respiratory failure, unspecified whether with hypoxia or hypercapnia: Secondary | ICD-10-CM | POA: Diagnosis not present

## 2021-01-26 DIAGNOSIS — Z66 Do not resuscitate: Secondary | ICD-10-CM | POA: Diagnosis not present

## 2021-01-26 DIAGNOSIS — Z515 Encounter for palliative care: Secondary | ICD-10-CM | POA: Diagnosis not present

## 2021-01-26 LAB — CULTURE, RESPIRATORY W GRAM STAIN

## 2021-01-26 LAB — GLUCOSE, CAPILLARY
Glucose-Capillary: 108 mg/dL — ABNORMAL HIGH (ref 70–99)
Glucose-Capillary: 132 mg/dL — ABNORMAL HIGH (ref 70–99)
Glucose-Capillary: 153 mg/dL — ABNORMAL HIGH (ref 70–99)

## 2021-01-26 MED ORDER — MORPHINE BOLUS VIA INFUSION
2.0000 mg | INTRAVENOUS | Status: DC | PRN
Start: 2021-01-26 — End: 2021-01-26

## 2021-01-26 MED ORDER — LORAZEPAM 2 MG/ML IJ SOLN
INTRAMUSCULAR | Status: AC
  Administered 2021-01-26: 2 mg
  Filled 2021-01-26: qty 1

## 2021-01-26 MED ORDER — ONDANSETRON HCL 4 MG/2ML IJ SOLN: 4.0000 mg | Freq: Four times a day (QID) | INTRAMUSCULAR | Status: DC | PRN

## 2021-01-26 MED ORDER — POLYVINYL ALCOHOL 1.4 % OP SOLN
1.0000 [drp] | Freq: Four times a day (QID) | OPHTHALMIC | Status: DC | PRN
  Filled 2021-01-26: qty 15

## 2021-01-26 MED ORDER — HALOPERIDOL LACTATE 5 MG/ML IJ SOLN
2.0000 mg | Freq: Once | INTRAMUSCULAR | Status: AC
  Administered 2021-01-26: 2 mg via INTRAVENOUS
  Filled 2021-01-26: qty 1

## 2021-01-26 MED ORDER — GLYCOPYRROLATE 1 MG PO TABS: 1.0000 mg | ORAL_TABLET | ORAL | Status: DC | PRN

## 2021-01-26 MED ORDER — GLYCOPYRROLATE 0.2 MG/ML IJ SOLN
0.2000 mg | INTRAMUSCULAR | Status: DC | PRN
  Administered 2021-01-26: 0.2 mg via INTRAVENOUS
  Filled 2021-01-26: qty 1

## 2021-01-26 MED ORDER — HALOPERIDOL LACTATE 2 MG/ML PO CONC: 0.5000 mg | ORAL | Status: DC | PRN

## 2021-01-26 MED ORDER — HALOPERIDOL 0.5 MG PO TABS: 0.5000 mg | ORAL_TABLET | ORAL | Status: DC | PRN

## 2021-01-26 MED ORDER — GLYCOPYRROLATE 0.2 MG/ML IJ SOLN: 0.2000 mg | INTRAMUSCULAR | Status: DC | PRN

## 2021-01-26 MED ORDER — ONDANSETRON 4 MG PO TBDP: 4.0000 mg | ORAL_TABLET | Freq: Four times a day (QID) | ORAL | Status: DC | PRN

## 2021-01-26 MED ORDER — MORPHINE BOLUS VIA INFUSION
2.0000 mg | INTRAVENOUS | Status: DC | PRN
Start: 2021-01-26 — End: 2021-01-27
  Administered 2021-01-26: 4 mg via INTRAVENOUS
  Administered 2021-01-27: 2 mg via INTRAVENOUS
  Filled 2021-01-26: qty 4

## 2021-01-26 MED ORDER — HALOPERIDOL LACTATE 5 MG/ML IJ SOLN: 0.5000 mg | INTRAMUSCULAR | Status: DC | PRN

## 2021-01-26 MED ORDER — HALOPERIDOL LACTATE 5 MG/ML IJ SOLN
2.0000 mg | INTRAMUSCULAR | Status: DC | PRN
  Administered 2021-01-26: 2 mg via INTRAVENOUS
  Filled 2021-01-26: qty 1

## 2021-01-26 NOTE — Progress Notes (Addendum)
Palliative Care Progress Note, Assessment & Plan   Patient Name: [name]       Date: 01/26/2021 DOB: 08-28-1940  Age: 80 y.o. MRN#: [id number] Attending Physician: Flora Lipps, MD Primary Care Physician: Bryson Corona, NP Admit Date: 01/04/2021  Reason for Consultation/Follow-up: Establishing goals of care  Subjective: Patient is lying in bed in mild distress.  Morphine drip and high flow nasal cannula still in place  HPI: 81 y.o. female  with past medical history of chronic A. fib (Eliquis), SSS status post pacemaker, HTN, HLD, CKD, OSA, hypothyroidism and bilateral chronic hip pain admitted on 01/14/2021 with shortness of breath.    During her hospitalization patient developed severe anxiety and respiratory distress.  Patient was intubated 11/26\ and then successfully extubated on 11/27.  On 11/28 patient developed severe encephalopathy with hypoxia.  Plan of Care: After reviewing the patient's chart, epic notes, and assessing the patient at bedside, I spoke with the patient's daughter [name].  She is at home and attempting to get her dad/patient's husband up and dressed.  Her plan is to bring him to the hospital today so that they can have their "final moments together".  I shared with Tammy that her mother is declining.  Tammy is aware of this as she spent the night in the hospital bedside and left just before 7 AM this morning.  I shared that her respirations are decreasing but that she appears to be comfortable and in no distress.  I again reminded Tammy that we do not want to prolong what we know is going to be a natural death.  But that we will await changing anything about her care until her family is able to come and be with her.  However also reminded Tammy that the focus is on the  patient and should she have signs of distress or suffering then we will move forward with ensuring her comfort.  Tammy said she understood.  Tammy was very appreciative and said she will be at the hospital soon as possible.  Questions and concerns were addressed. The family was encouraged to call with questions or concerns.   Afternoon update: Received message from daughter [name] that she would like to move to full comfort now.  She shared that she and her father are on their way to the hospital now.    Full comfort care orders put in  place.  Oxygen to remain with patient until daughter and patient's husband are at bedside.  Nursing made aware.  CCM updated.  Care plan was discussed with nursing, patient's daughter  Code Status: DNR  Prognosis:  Hours - Days  Discharge Planning: Anticipated Hospital Death  Recommendations/Plan: Continue current plan with Morphine gtt Do not escalate care DNR  Physical Exam Constitutional:      Appearance: She is ill-appearing and toxic-appearing.  HENT:     Head: Normocephalic and atraumatic.     Mouth/Throat:     Mouth: Mucous membranes are dry.  Cardiovascular:     Rate and Rhythm: Bradycardia present.  Pulmonary:     Effort: No respiratory distress.  Abdominal:     Palpations: Abdomen is soft.  Skin:    General: Skin is dry.     Comments: Skin appears bluish and dusky  Neurological:     Comments: nonverbal                Palliative Assessment/Data: 10%       Total Time 15 minutes  Greater than 50%  of this time was spent counseling and coordinating care related to the above assessment and plan.  Thank you for allowing the Palliative Medicine Team to assist in the care of this patient.  [place] Ilsa Iha, FNP-BC Palliative Medicine Team Team Phone # [phone]

## 2021-01-26 NOTE — Progress Notes (Signed)
Patients unable to follow commands. Patient had episodes of Sinus Brady this morning, MD ok with HR parameters >40. Increased morphine and discontinued precedex per MD. Patient given PRN and scheduled medication for increase work of breathing/air hunger/agitation. Family made patient comfort care, family at bedside.

## 2021-01-26 NOTE — Progress Notes (Signed)
CRITICAL CARE PROGRESS NOTE    EVENTS OVERNIGHT/INTERVAL CHANGES Patient responsive to pain,unable to follow commands. In Afibb rhythm, on heated high flow at 100%, sat in the 95s.  350 out per Foley. No bowel movement. Patient given PRN morphine for increase work of breathing, air hunger Patient is suffering and suffocating and in the state of dying process Plan for comfort measures when family arrives     Patient description:  80 y.o with significant PMH of chronic atrial fibrillation on Eliquis, sick sinus syndrome s/p pacemaker, HTN, HLD, CKD OSA not on CPAP, hypothyroidism and bilateral chronic hip pain who presented to the ED with chief complaints of progressive shortness of breath.   Patient developed fevers, chills, generalized weakness, cough, lethargy, and dyspnea several days before presenting to the ED. Patient and her husband took a home COVID test on 01/10/21 which came back positive. Per daughter, her primary medical doctor started her on Paxlovid, Azithromycin and Prednisone x 5 days with no improvement. Patient continued to have progressive dyspnea, refused po intake and meds due to lack of appetite and lethargy. Patient's symptoms worsened  prompting her daughter to call EMS. On EMS arrival, patient was found to be hypoxic with sats in the 50's on R.A. She was placed on 15L/min via NRB and transported to the ED.  Patient was placed on BiPAP due to significant dyspnea with isolation precautions for COVID-19. She was started on Remdesivir and steroids. PCCM consulted for admission and further management.  01/20/21- patient s/p CTPE no PE but does show bilateral GGO infiltrates consistent with COVID19 01/21/21- patient with severe anxiety, she has been bedridden since admission with atelectasis. Has not slept in  2 days she states. I spoke with daughter. PT/OT and OOB to chair today. Shes higher risk for falls ordered 1:1.  We started BIPAP today with precedex. Updated daughter in person.  01/22/21- patient had respiratory distress and became unresponsive with spO2<60%.  We spoke to daughter immediately and Tammy reviewed goals of care highlighted full code measures and instructed to proceed with ETT and MV temporarily since patient clearly stated she does not whish to die and is not ready to give up medically.  RN Jonelle Sidle Fairing confirmed codes status during [place] conference. AF is rate controlled.   01/23/21-  had meeting with daughter today, plan for extubation without re-intubation as per patients wishes. She is doing ok on HFNC and precedex with spo2 >88% and non-tachycardic.   11/28 severe encephalopathy and hypoxia, Limited CODE status 11/29 suffering and suffocating and dying process 11/30 suffering and suffocating and dying process    EVENTS OVERNIGHT/INTERVAL CHANGES Patient responsive to pain,unable to follow commands. In Afibb rhythm, on heated high flow at 100%, sat in the 95s.  350 out per Foley. No bowel movement. Patient given PRN morphine for increase work of breathing, air hunger Patient is suffering and suffocating and in the state of dying process Plan for comfort measures when family arrives          Lines/tubes : External Urinary Catheter (Active)  Collection Container Dedicated Suction Canister 01/20/21 0800  Suction (Verified suction is between 40-80 mmHg) Yes 01/20/21 0800  Securement Method Leg strap 01/19/21 0700  Site Assessment Clean;Intact 01/20/21 0800  Intervention Female External Urinary Catheter Replaced 01/20/21 0800  Output (mL) 150 mL 01/20/21 1218    Microbiology/Sepsis markers: Results for orders placed or performed during the hospital encounter of 01/10/2021  Blood culture (single)     Status: None  Collection Time: 01/09/2021  7:26 PM   Specimen:  BLOOD  Result Value Ref Range Status   Specimen Description BLOOD RIGHT ANTECUBITAL  Final   Special Requests   Final    BOTTLES DRAWN AEROBIC AND ANAEROBIC Blood Culture adequate volume   Culture   Final    NO GROWTH 5 DAYS Performed at [facility], [address], [place], [place] [address]    Report Status 01/23/2021 FINAL  Final  Resp Panel by RT-PCR (Flu A&B, Covid) Nasopharyngeal Swab     Status: Abnormal   Collection Time: 01/05/2021  8:52 PM   Specimen: Nasopharyngeal Swab; Nasopharyngeal(NP) swabs in vial transport medium  Result Value Ref Range Status   SARS Coronavirus 2 by RT PCR POSITIVE (A) NEGATIVE Final    Comment: RESULT CALLED TO, READ BACK BY AND VERIFIED WITHToni Arthurs RN [id number] 01/13/2021 HNM (NOTE) SARS-CoV-2 target nucleic acids are DETECTED.  The SARS-CoV-2 RNA is generally detectable in upper respiratory specimens during the acute phase of infection. Positive results are indicative of the presence of the identified virus, but do not rule out bacterial infection or co-infection with other pathogens not detected by the test. Clinical correlation with patient history and other diagnostic information is necessary to determine patient infection status. The expected result is Negative.  Fact Sheet for Patients: EntrepreneurPulse.com.au  Fact Sheet for Healthcare Providers: IncredibleEmployment.be  This test is not yet approved or cleared by the [place] FDA and  has been authorized for detection and/or diagnosis of SARS-CoV-2 by FDA under an Emergency Use Authorization (EUA).  This EUA will remain in effect (meaning this test can b e used) for the duration of  the COVID-19 declaration under Section 564(b)(1) of the Act, 21 U.S.C. section 360bbb-3(b)(1), unless the authorization is terminated or revoked sooner.     Influenza A by PCR NEGATIVE NEGATIVE Final   Influenza B by PCR NEGATIVE NEGATIVE Final     Comment: (NOTE) The Xpert Xpress SARS-CoV-2/FLU/RSV plus assay is intended as an aid in the diagnosis of influenza from Nasopharyngeal swab specimens and should not be used as a sole basis for treatment. Nasal washings and aspirates are unacceptable for Xpert Xpress SARS-CoV-2/FLU/RSV testing.  Fact Sheet for Patients: EntrepreneurPulse.com.au  Fact Sheet for Healthcare Providers: IncredibleEmployment.be  This test is not yet approved or cleared by the [place] FDA and has been authorized for detection and/or diagnosis of SARS-CoV-2 by FDA under an Emergency Use Authorization (EUA). This EUA will remain in effect (meaning this test can be used) for the duration of the COVID-19 declaration under Section 564(b)(1) of the Act, 21 U.S.C. section 360bbb-3(b)(1), unless the authorization is terminated or revoked.  Performed at [facility], [address] [place], [place] [address]   MRSA Next Gen by PCR, Nasal     Status: Abnormal   Collection Time: 01/20/2021 11:14 PM   Specimen: Nasal Mucosa; Nasal Swab  Result Value Ref Range Status   MRSA by PCR Next Gen DETECTED (A) NOT DETECTED Final    Comment: RESULT CALLED TO, READ BACK BY AND VERIFIED WITH: JUSTIN ODELL @0139  ON 01/19/21 SKL (NOTE) The GeneXpert MRSA Assay (FDA approved for NASAL specimens only), is one component of a comprehensive MRSA colonization surveillance program. It is not intended to diagnose MRSA infection nor to guide or monitor treatment for MRSA infections. Test performance is not FDA approved in patients less than 53 years old. Performed at [facility], [address], [place],  [address]  Culture, blood (routine x 2)     Status: None   Collection Time: 01/08/2021 11:51 PM   Specimen: BLOOD LEFT FOREARM  Result Value Ref Range Status   Specimen Description BLOOD LEFT FOREARM  Final   Special Requests   Final    BOTTLES DRAWN AEROBIC AND  ANAEROBIC Blood Culture adequate volume   Culture   Final    NO GROWTH 5 DAYS Performed at [facility], [address], [place], [place] [address]    Report Status 01/24/2021 FINAL  Final  Culture, Respiratory w Gram Stain     Status: None (Preliminary result)   Collection Time: 01/23/21  7:59 AM   Specimen: Tracheal Aspirate; Respiratory  Result Value Ref Range Status   Specimen Description   Final    TRACHEAL ASPIRATE Performed at [facility], [address] [place], [place] [address]    Special Requests   Final    NONE Performed at [facility], [address] [place], [place] [address]    Gram Stain   Final    ABUNDANT WBC PRESENT,BOTH PMN AND MONONUCLEAR RARE YEAST    Culture   Final    FEW YEAST IDENTIFICATION TO FOLLOW Performed at [facility], [place] [address] [place], [place] [address]    Report Status PENDING  Incomplete    Anti-infectives:  Anti-infectives (From admission, onward)    Start     Dose/Rate Route Frequency Ordered Stop   01/24/21 1000  doxycycline (VIBRAMYCIN) 100 mg in sodium chloride 0.9 % 250 mL IVPB        100 mg 125 mL/hr over 120 Minutes Intravenous Every 12 hours 01/24/21 0655 01/28/21 0959   01/22/21 2300  doxycycline (VIBRAMYCIN) 100 mg in sodium chloride 0.9 % 250 mL IVPB  Status:  Discontinued        100 mg 125 mL/hr over 120 Minutes Intravenous Every 12 hours 01/22/21 2221 01/23/21 2354   01/22/21 2200  doxycycline (VIBRA-TABS) tablet 100 mg  Status:  Discontinued        100 mg Per Tube Every 12 hours 01/22/21 1553 01/22/21 2221   01/21/21 1530  doxycycline (VIBRAMYCIN) 100 mg in sodium chloride 0.9 % 250 mL IVPB  Status:  Discontinued        100 mg 125 mL/hr over 120 Minutes Intravenous 2 times daily 01/21/21 1440 01/22/21 1553   01/19/21 1315  hydroxychloroquine (PLAQUENIL) tablet 200 mg  Status:  Discontinued        200 mg Oral 2 times daily 01/19/21 1215 01/22/21 0913   01/19/21 1000   remdesivir 100 mg in sodium chloride 0.9 % 100 mL IVPB       See Hyperspace for full Linked Orders Report.   100 mg 200 mL/hr over 30 Minutes Intravenous Daily 01/04/2021 2202 01/23/21 0717   01/19/21 0200  cefTRIAXone (ROCEPHIN) 1 g in sodium chloride 0.9 % 100 mL IVPB  Status:  Discontinued        1 g 200 mL/hr over 30 Minutes Intravenous Every 24 hours 01/19/21 0100 01/22/21 0913   01/19/21 0200  azithromycin (ZITHROMAX) 500 mg in sodium chloride 0.9 % 250 mL IVPB  Status:  Discontinued        500 mg 250 mL/hr over 60 Minutes Intravenous Every 24 hours 01/19/21 0100 01/21/21 1440   01/05/2021 2215  remdesivir 200 mg in sodium chloride 0.9% 250 mL IVPB       See Hyperspace for full Linked Orders Report.  200 mg 580 mL/hr over 30 Minutes Intravenous Once 12/29/2020 2202 01/19/21 0700          PHYSICAL EXAMINATION   Vital Signs: Temp:  [98.3 F (36.8 C)-99.1 F (37.3 C)] 98.4 F (36.9 C) (11/30 0400) Pulse Rate:  [48-83] 58 (11/30 0600) Resp:  [15-37] 16 (11/30 0600) BP: (98-131)/(58-90) 113/67 (11/30 0600) SpO2:  [86 %-100 %] 93 % (11/30 0600) FiO2 (%):  [100 %] 100 % (11/29 1451)    REVIEW OF SYSTEMS  PATIENT IS UNABLE TO PROVIDE COMPLETE REVIEW OF SYSTEMS DUE TO SEVERE CRITICAL ILLNESS AND TOXIC METABOLIC ENCEPHALOPATHY    PHYSICAL EXAMINATION:  GENERAL:critically ill appearing, +resp distress EYES: Pupils equal, round, reactive to light.  No scleral icterus.  MOUTH: Moist mucosal membrane. cyanosis NECK: Supple.  PULMONARY: +rhonchi, +wheezing CARDIOVASCULAR: S1 and S2.  No murmurs  GASTROINTESTINAL: Soft, nontender, -distended. Positive bowel sounds.  MUSCULOSKELETAL:+edema.  NEUROLOGIC: obtunded SKIN:intact,warm,dry     PERTINENT DATA     Infusions:  sodium chloride     dexmedetomidine (PRECEDEX) IV infusion 1.2 mcg/kg/hr (01/26/21 0545)   doxycycline (VIBRAMYCIN) IV Stopped (01/25/21 2148)   morphine 3 mg/hr (01/26/21 0545)   phenylephrine  (NEO-SYNEPHRINE) Adult infusion Stopped (01/25/21 0051)   Scheduled Medications:  cholecalciferol  2,000 Units Oral Daily   dexamethasone (DECADRON) injection  6 mg Intravenous Q12H   digoxin  0.125 mg Intravenous Daily   enoxaparin (LOVENOX) injection  1 mg/kg Subcutaneous Q12H   mouth rinse  15 mL Mouth Rinse 10 times per day   PRN Medications: albuterol, docusate sodium, morphine injection, morphine, polyethylene glycol Hemodynamic parameters:   Intake/Output: 11/29 0701 - 11/30 0700 In: 1118.1 [I.V.:618.1; IV Piggyback:500] Out: 805 [Urine:805]  Ventilator  Settings: FiO2 (%):  [100 %] 100 %   LAB RESULTS:  Basic Metabolic Panel: Recent Labs  Lab 01/21/21 0547 01/22/21 0500 01/23/21 0849 01/24/21 0750 01/25/21 0403  NA 142 147* 149* 149* 151*  K 4.5 4.2 4.2 3.6 4.1  CL 116* 113* 117* 113* 119*  CO2 18* 22 24 26 23   GLUCOSE 110* 119* 132* 190* 136*  BUN 49* 51* 63* 69* 72*  CREATININE 0.97 1.16* 1.48* 1.38* 1.23*  CALCIUM 8.3* 8.6* 8.1* 8.4* 8.3*  MG 1.9 2.1 2.2 2.4 2.5*  PHOS 2.8 4.8* 5.1* 3.7 2.6    Liver Function Tests: Recent Labs  Lab 01/20/21 0323 01/21/21 0547 01/22/21 0500 01/23/21 0849  AST 37 47* 53* 26  ALT 23 22 31 25   ALKPHOS 60 65 81 66  BILITOT 0.9 1.0 1.4* 1.0  PROT 6.6 5.9* 6.9 6.2*  ALBUMIN 2.7* 2.5* 2.7* 2.7*    No results for input(s): LIPASE, AMYLASE in the last 168 hours. No results for input(s): AMMONIA in the last 168 hours. CBC: Recent Labs  Lab 01/20/21 0323 01/21/21 0547 01/22/21 0500 01/23/21 0849 01/24/21 0750 01/25/21 0403  WBC 9.0 10.5 13.3* 21.3* 10.2 13.6*  NEUTROABS 7.9* 9.7* 12.2* 20.1*  --   --   HGB 14.0 14.5 15.9* 14.2 14.2 15.4*  HCT 41.4 42.7 48.3* 44.2 42.5 46.1*  MCV 94.5 94.3 95.6 98.9 94.9 94.1  PLT 164 140* 122* 142* 94* 96*    Cardiac Enzymes: No results for input(s): CKTOTAL, CKMB, CKMBINDEX, TROPONINI in the last 168 hours. BNP: Invalid input(s): POCBNP CBG: Recent Labs  Lab  01/25/21 1353 01/25/21 1544 01/25/21 1914 01/26/21 0001 01/26/21 0356  GLUCAP 137* 146* 168* 153* 132*        IMAGING RESULTS:  Imaging: No results  found. @PROBHOSP @ No results found.       ASSESSMENT AND PLAN   80 yo white female admittedf or severe ARDS and COVID 19 infection and pneumonia with severe hypoxia and encephalopathy with acute cardiac strain and afib with RVR with AKI, patient is dying process  SUFFERING AND SUFFOCATING DYING PROCESS Will Increase MORPHINE INFUSION AND WEAN DOWN PRECE DEX DUE TO LOW HR    Severe COVID-19 infection, ARDS and pneumonia/pneumonitis Tested + 11/14 treated with Paxlovid, Prednisone and Azithromycin x 5 days -Place on HHFNC, wean FiO2 as tolerated -extubated 11/27 to HFNC Continue meds until family arrives for comfort measures   ACUTE KIDNEY INJURY/Renal Failure -continue Foley Catheter-assess need -Avoid nephrotoxic agents -Follow urine output, BMP -Ensure adequate renal perfusion, optimize oxygenation -Renal dose medications   Intake/Output Summary (Last 24 hours) at 01/26/2021 0730 Last data filed at 01/26/2021 0545 Gross per 24 hour  Intake 1118.07 ml  Output 805 ml  Net 313.07 ml    BMP Latest Ref Rng & Units 01/25/2021 01/24/2021 01/23/2021  Glucose 70 - 99 mg/dL 136(H) 190(H) 132(H)  BUN 8 - 23 mg/dL 72(H) 69(H) 63(H)  Creatinine 0.44 - 1.00 mg/dL 1.23(H) 1.38(H) 1.48(H)  BUN/Creat Ratio 12 - 28 - - -  Sodium 135 - 145 mmol/L 151(H) 149(H) 149(H)  Potassium 3.5 - 5.1 mmol/L 4.1 3.6 4.2  Chloride 98 - 111 mmol/L 119(H) 113(H) 117(H)  CO2 22 - 32 mmol/L 23 26 24   Calcium 8.9 - 10.3 mg/dL 8.3(L) 8.4(L) 8.1(L)       Chronic Atrial Fibrlilation       -on full lovenox Hx: SSS s/p pacemaker Thromboembolic risk factors ( age  -2, HTN-1, Gender-1) for a CHADSVASc Score of >=4       Best practice:  Diet:  NPO Pain/Anxiety/Delirium protocol (if indicated): No VAP protocol (if indicated): Not  indicated DVT prophylaxis: Subcutaneous Heparin GI prophylaxis: H2B Glucose control:  SSI No Central venous access:  N/A Arterial line:  N/A Foley:  N/A Mobility:  bed rest  PT consulted: N/A Daily multidisciplinary goals of care discussion  Code Status:  limited Disposition: ICU      DVT/GI PRX  assessed I Assessed the need for Labs I Assessed the need for Foley I Assessed the need for Central Venous Line Family Discussion when available I Assessed the need for Mobilization I made an Assessment of medications to be adjusted accordingly Safety Risk assessment completed  CASE DISCUSSED IN MULTIDISCIPLINARY ROUNDS WITH ICU TEAM     Critical Care Time devoted to patient care services described in this note is 45 minutes.  Critical care was necessary to treat /prevent imminent and life-threatening deterioration. Overall, patient is critically ill, prognosis is guarded.  Patient with Multiorgan failure and at high risk for cardiac arrest and death.    Corrin Parker, M.D.  [facility]  Medical Director [place] Director [facility] Cardio-Pulmonary Department

## 2021-01-27 DEATH — deceased

## 2021-02-23 ENCOUNTER — Encounter: Payer: Medicare Other | Admitting: Pain Medicine

## 2021-02-27 NOTE — Progress Notes (Signed)
Patient expired @ [phone] after daughter left her bedside. Rufina Falco, NP notified.

## 2021-02-27 NOTE — Death Summary Note (Signed)
DEATH SUMMARY   Patient Details  Name: [name] MRN: [id number] DOB: 1940/12/28  Admission/Discharge Information   Admit Date:  February 14, 2021  Date of Death: Date of Death: 23-Feb-2021  Time of Death: Time of Death: 0426  Length of Stay: 06-02-22  Referring Physician: Bryson Corona, NP   Reason(s) for Hospitalization  COVID 19 pneumonia and RESP FAILURE  Diagnoses  Preliminary cause of death: COVID 57 infection/pneumonia/ARDS Secondary Diagnoses (including complications and co-morbidities):  Principal Problem:   Acute respiratory failure due to COVID-19 [facility])   Brief Hospital Course (including significant findings, care, treatment, and services provided and events leading to death)         Patient description:  81 y.o with significant PMH of chronic atrial fibrillation on Eliquis, sick sinus syndrome s/p pacemaker, HTN, HLD, CKD OSA not on CPAP, hypothyroidism and bilateral chronic hip pain who presented to the ED with chief complaints of progressive shortness of breath.   Patient developed fevers, chills, generalized weakness, cough, lethargy, and dyspnea several days before presenting to the ED. Patient and her husband took a home COVID test on 01/10/21 which came back positive. Per daughter, her primary medical doctor started her on Paxlovid, Azithromycin and Prednisone x 5 days with no improvement. Patient continued to have progressive dyspnea, refused po intake and meds due to lack of appetite and lethargy. Patient's symptoms worsened  prompting her daughter to call EMS. On EMS arrival, patient was found to be hypoxic with sats in the 50's on R.A. She was placed on 15L/min via NRB and transported to the ED.  Patient was placed on BiPAP due to significant dyspnea with isolation precautions for COVID-19. She was started on Remdesivir and steroids. PCCM consulted for admission and further management.   01/20/21- patient s/p CTPE no PE but does show bilateral GGO infiltrates consistent  with COVID19 01/21/21- patient with severe anxiety, she has been bedridden since admission with atelectasis. Has not slept in 2 days she states. I spoke with daughter. PT/OT and OOB to chair today. Shes higher risk for falls ordered 1:1.  We started BIPAP today with precedex. Updated daughter in person.   01/22/21- patient had respiratory distress and became unresponsive with spO2<60%.  We spoke to daughter immediately and Tammy reviewed goals of care highlighted full code measures and instructed to proceed with ETT and MV temporarily since patient clearly stated she does not whish to die and is not ready to give up medically.  RN Jonelle Sidle Fairing confirmed codes status during [place] conference. AF is rate controlled.    01/23/21-  had meeting with daughter today, plan for extubation without re-intubation as per patients wishes. She is doing ok on HFNC and precedex with spo2 >88% and non-tachycardic.    11/28 severe encephalopathy and hypoxia, Limited CODE status 11/29 suffering and suffocating and dying process 11/30 suffering and suffocating and dying process      Patient responsive to pain,unable to follow commands. In Afibb rhythm, on heated high flow at 100%, sat in the 95s.  350 out per Foley. No bowel movement. Patient given PRN morphine for increase work of breathing, air hunger Patient is suffering and suffocating and in the state of dying process Plan for comfort measures when family arrives    GOALS OF CARE DISCUSSION  The Clinical status was relayed to family in detail.  Updated and notified of patients medical condition.    Patient remains unresponsive and will not open eyes to command.   Patient is having a  weak cough and struggling to remove secretions.   Patient with increased WOB and using accessory muscles to breathe Explained to family course of therapy and the modalities    Patient with Progressive multiorgan failure with a very high probablity of a very minimal chance  of meaningful recovery despite all aggressive and optimal medical therapy.  PATIENT REMAINS FULL CODE  Family understands the situation.  They have consented and agreed to DNR/DNI and would like to proceed with Comfort care measures.      Pertinent Labs and Studies  Significant Diagnostic Studies CT Angio Chest Pulmonary Embolism (PE) W or WO Contrast  Result Date: 01/20/2021 CLINICAL DATA:  Chest pain and shortness of breath with abdominal distension EXAM: CT ANGIOGRAPHY CHEST CT ABDOMEN AND PELVIS WITH CONTRAST TECHNIQUE: Multidetector CT imaging of the chest was performed using the standard protocol during bolus administration of intravenous contrast. Multiplanar CT image reconstructions and MIPs were obtained to evaluate the vascular anatomy. Multidetector CT imaging of the abdomen and pelvis was performed using the standard protocol during bolus administration of intravenous contrast. CONTRAST:  156mL OMNIPAQUE IOHEXOL 350 MG/ML SOLN COMPARISON:  01/17/2021 FINDINGS: CTA CHEST FINDINGS Cardiovascular: Atherosclerotic calcifications of the thoracic aorta are noted. No aneurysmal dilatation or dissection is noted. No cardiac enlargement is seen. Coronary calcifications are noted. Pulmonary artery shows a normal branching pattern without evidence of filling defect to suggest pulmonary embolism. Mediastinum/Nodes: Thoracic inlet is within normal limits. No sizable hilar or mediastinal adenopathy is noted. The esophagus as visualized is within normal limits. Lungs/Pleura: Lungs are well aerated bilaterally. Patchy airspace opacity is noted throughout both lung similar to that seen on prior chest x-ray consistent with multifocal pneumonia. These changes may represent sequelae from the prior COVID infection. No sizable effusion is noted. Musculoskeletal: Degenerative changes of the thoracic spine are noted. No acute rib abnormality is noted. Review of the MIP images confirms the above findings. CT  ABDOMEN and PELVIS FINDINGS Hepatobiliary: No focal liver abnormality is seen. No gallstones, gallbladder wall thickening, or biliary dilatation. Pancreas: Unremarkable. No pancreatic ductal dilatation or surrounding inflammatory changes. Spleen: Normal in size without focal abnormality. Adrenals/Urinary Tract: Adrenal glands are within normal limits. Kidneys demonstrate a normal enhancement pattern bilaterally. Small 1 cm cyst is noted within the left kidney. No obstructive changes are seen. The bladder is well distended. Stomach/Bowel: Fecal material is noted within the rectal vault consistent with a degree of constipation. No obstructive changes are seen. The appendix is not well visualized. No inflammatory changes to suggest appendicitis are noted. Small bowel and stomach are unremarkable. Vascular/Lymphatic: Aortic atherosclerosis. No enlarged abdominal or pelvic lymph nodes. Reproductive: Uterus and bilateral adnexa are unremarkable. Other: No abdominal wall hernia or abnormality. No abdominopelvic ascites. Musculoskeletal: Left hip prosthesis is noted. Degenerative changes of lumbar spine are noted. Review of the MIP images confirms the above findings. IMPRESSION: CTA of the chest: No acute pulmonary embolism is noted. Patchy airspace opacities are noted similar to that seen on recent chest x-ray which may be of sequelae from the recent COVID infection. No sizable effusion is seen. CT of the abdomen and pelvis: Prominent fecal material within the rectal vault which may represent a degree of impaction. No acute abdominal abnormality is seen. Aortic Atherosclerosis (ICD10-I70.0). Electronically Signed   By: Inez Catalina M.D.   On: 01/20/2021 00:28   CT ABDOMEN PELVIS W CONTRAST  Result Date: 01/20/2021 CLINICAL DATA:  Chest pain and shortness of breath with abdominal distension EXAM: CT ANGIOGRAPHY CHEST  CT ABDOMEN AND PELVIS WITH CONTRAST TECHNIQUE: Multidetector CT imaging of the chest was performed  using the standard protocol during bolus administration of intravenous contrast. Multiplanar CT image reconstructions and MIPs were obtained to evaluate the vascular anatomy. Multidetector CT imaging of the abdomen and pelvis was performed using the standard protocol during bolus administration of intravenous contrast. CONTRAST:  148mL OMNIPAQUE IOHEXOL 350 MG/ML SOLN COMPARISON:  01/19/2021 FINDINGS: CTA CHEST FINDINGS Cardiovascular: Atherosclerotic calcifications of the thoracic aorta are noted. No aneurysmal dilatation or dissection is noted. No cardiac enlargement is seen. Coronary calcifications are noted. Pulmonary artery shows a normal branching pattern without evidence of filling defect to suggest pulmonary embolism. Mediastinum/Nodes: Thoracic inlet is within normal limits. No sizable hilar or mediastinal adenopathy is noted. The esophagus as visualized is within normal limits. Lungs/Pleura: Lungs are well aerated bilaterally. Patchy airspace opacity is noted throughout both lung similar to that seen on prior chest x-ray consistent with multifocal pneumonia. These changes may represent sequelae from the prior COVID infection. No sizable effusion is noted. Musculoskeletal: Degenerative changes of the thoracic spine are noted. No acute rib abnormality is noted. Review of the MIP images confirms the above findings. CT ABDOMEN and PELVIS FINDINGS Hepatobiliary: No focal liver abnormality is seen. No gallstones, gallbladder wall thickening, or biliary dilatation. Pancreas: Unremarkable. No pancreatic ductal dilatation or surrounding inflammatory changes. Spleen: Normal in size without focal abnormality. Adrenals/Urinary Tract: Adrenal glands are within normal limits. Kidneys demonstrate a normal enhancement pattern bilaterally. Small 1 cm cyst is noted within the left kidney. No obstructive changes are seen. The bladder is well distended. Stomach/Bowel: Fecal material is noted within the rectal vault consistent  with a degree of constipation. No obstructive changes are seen. The appendix is not well visualized. No inflammatory changes to suggest appendicitis are noted. Small bowel and stomach are unremarkable. Vascular/Lymphatic: Aortic atherosclerosis. No enlarged abdominal or pelvic lymph nodes. Reproductive: Uterus and bilateral adnexa are unremarkable. Other: No abdominal wall hernia or abnormality. No abdominopelvic ascites. Musculoskeletal: Left hip prosthesis is noted. Degenerative changes of lumbar spine are noted. Review of the MIP images confirms the above findings. IMPRESSION: CTA of the chest: No acute pulmonary embolism is noted. Patchy airspace opacities are noted similar to that seen on recent chest x-ray which may be of sequelae from the recent COVID infection. No sizable effusion is seen. CT of the abdomen and pelvis: Prominent fecal material within the rectal vault which may represent a degree of impaction. No acute abdominal abnormality is seen. Aortic Atherosclerosis (ICD10-I70.0). Electronically Signed   By: Inez Catalina M.D.   On: 01/20/2021 00:28   DG Chest Port 1 View  Result Date: 01/23/2021 CLINICAL DATA:  Follow-up respiratory distress. Reported pulmonary edema and COVID-19 positive. EXAM: PORTABLE CHEST 1 VIEW COMPARISON:  01/23/2021 at 4:42 a.m., and older exams. FINDINGS: Left pneumothorax, estimated at 15%, without change from the earlier exam. Minimal right apical pneumothorax is less well-defined on the current study, but without convincing change. Bilateral interstitial and hazy airspace lung opacities are unchanged. Since the prior study the endotracheal tube and nasal/orogastric tube have been removed. IMPRESSION: 1. Status post removal of the endotracheal tube and nasal/orogastric tube. 2. Persistent small left and minimal right apical pneumothoraces, unchanged from the exam obtained earlier today. 3. No change in the bilateral interstitial and airspace lung opacities since the most  recent prior study. Electronically Signed   By: Lajean Manes M.D.   On: 01/23/2021 17:18   DG Chest [facility]  Result Date: 01/23/2021 CLINICAL DATA:  Difficulty breathing EXAM: PORTABLE CHEST 1 VIEW COMPARISON:  Previous studies including the examination of 01/22/2021 FINDINGS: Transverse diameter of heart is slightly increased. Tip of endotracheal tube is 3.5 cm above the carina. Tip NG tube is seen in the fundus of the stomach. Left hemidiaphragm is elevated. There is increase in size of left apical pneumothorax. There is decrease in right apical pneumothorax. There is interval decrease in subcutaneous emphysema. Small amount of pneumomediastinum is seen. Pacemaker battery is seen in the left infraclavicular region. There is slight improvement in aeration in both lungs with slight decrease in extensive infiltrates in both lungs. Lateral CP angles are indistinct. IMPRESSION: There is increase in size of left apical pneumothorax and decrease in size of small right apical pneumothorax. There is interval improvement in the aeration in both lungs suggesting resolving pneumonia. There is interval decrease in subcutaneous emphysema. Electronically Signed   By: Elmer Picker M.D.   On: 01/23/2021 08:11   DG Chest Port 1 View  Result Date: 01/22/2021 CLINICAL DATA:  81 year old female with endotracheal and endotracheal tube placements. EXAM: PORTABLE CHEST 1 VIEW COMPARISON:  01/20/2021 CT prior studies FINDINGS: An endotracheal tube with tip 2.7 cm above the carina and NG tube with tip overlying the proximal to mid stomach noted. Very small biapical pneumothoraces, pneumomediastinum and subcutaneous emphysema are now noted. Increased conspicuity of the LEFT heart border likely represents a LEFT basilar pneumothorax as well. Stable to slightly increasing diffuse bilateral airspace opacities are noted. A LEFT sided pacemaker is again identified. IMPRESSION: 1. Very small biapical pneumothoraces,  pneumomediastinum and subcutaneous emphysema. Probable LEFT basilar pneumothorax. 2. Diffuse bilateral airspace opacities, stable to slightly increasing. 3. Endotracheal tube and NG tube as described. Critical Value/emergent results were called by telephone at the time of interpretation on 01/22/2021 at 10:02 am to provider Tiffany, nurse for this patient, who verbally acknowledged these results. Electronically Signed   By: Margarette Canada M.D.   On: 01/22/2021 10:00   DG Chest Portable 1 View  Result Date: 01/12/2021 CLINICAL DATA:  Shortness of breath EXAM: PORTABLE CHEST 1 VIEW COMPARISON:  05/01/2014 FINDINGS: Cardiac shadow is stable. Pacing device is noted. Aortic calcifications are again seen. Patchy airspace disease is noted bilaterally predominately in the upper lobes which may be related to the recent COVID diagnosis. No sizable effusion is seen. No bony abnormality is noted. IMPRESSION: Patchy airspace opacities primarily in the upper lobes which may be sequelae from the prior COVID infection. Electronically Signed   By: Inez Catalina M.D.   On: 12/30/2020 19:51    Microbiology Recent Results (from the past 240 hour(s))  Blood culture (single)     Status: None   Collection Time: 01/24/2021  7:26 PM   Specimen: BLOOD  Result Value Ref Range Status   Specimen Description BLOOD RIGHT ANTECUBITAL  Final   Special Requests   Final    BOTTLES DRAWN AEROBIC AND ANAEROBIC Blood Culture adequate volume   Culture   Final    NO GROWTH 5 DAYS Performed at [facility], [address] [place], [place] [address]    Report Status 01/23/2021 FINAL  Final  Resp Panel by RT-PCR (Flu A&B, Covid) Nasopharyngeal Swab     Status: Abnormal   Collection Time: 01/01/2021  8:52 PM   Specimen: Nasopharyngeal Swab; Nasopharyngeal(NP) swabs in vial transport medium  Result Value Ref Range Status   SARS Coronavirus 2 by RT PCR POSITIVE (A) NEGATIVE Final  Comment: RESULT CALLED TO, READ BACK BY AND  VERIFIED WITH: Toni Arthurs RN [id number] 01/04/2021 HNM (NOTE) SARS-CoV-2 target nucleic acids are DETECTED.  The SARS-CoV-2 RNA is generally detectable in upper respiratory specimens during the acute phase of infection. Positive results are indicative of the presence of the identified virus, but do not rule out bacterial infection or co-infection with other pathogens not detected by the test. Clinical correlation with patient history and other diagnostic information is necessary to determine patient infection status. The expected result is Negative.  Fact Sheet for Patients: EntrepreneurPulse.com.au  Fact Sheet for Healthcare Providers: IncredibleEmployment.be  This test is not yet approved or cleared by the [place] FDA and  has been authorized for detection and/or diagnosis of SARS-CoV-2 by FDA under an Emergency Use Authorization (EUA).  This EUA will remain in effect (meaning this test can b e used) for the duration of  the COVID-19 declaration under Section 564(b)(1) of the Act, 21 U.S.C. section 360bbb-3(b)(1), unless the authorization is terminated or revoked sooner.     Influenza A by PCR NEGATIVE NEGATIVE Final   Influenza B by PCR NEGATIVE NEGATIVE Final    Comment: (NOTE) The Xpert Xpress SARS-CoV-2/FLU/RSV plus assay is intended as an aid in the diagnosis of influenza from Nasopharyngeal swab specimens and should not be used as a sole basis for treatment. Nasal washings and aspirates are unacceptable for Xpert Xpress SARS-CoV-2/FLU/RSV testing.  Fact Sheet for Patients: EntrepreneurPulse.com.au  Fact Sheet for Healthcare Providers: IncredibleEmployment.be  This test is not yet approved or cleared by the [place] FDA and has been authorized for detection and/or diagnosis of SARS-CoV-2 by FDA under an Emergency Use Authorization (EUA). This EUA will remain in effect (meaning this test  can be used) for the duration of the COVID-19 declaration under Section 564(b)(1) of the Act, 21 U.S.C. section 360bbb-3(b)(1), unless the authorization is terminated or revoked.  Performed at [facility], [address] [place], [place] [address]   MRSA Next Gen by PCR, Nasal     Status: Abnormal   Collection Time: 01/19/2021 11:14 PM   Specimen: Nasal Mucosa; Nasal Swab  Result Value Ref Range Status   MRSA by PCR Next Gen DETECTED (A) NOT DETECTED Final    Comment: RESULT CALLED TO, READ BACK BY AND VERIFIED WITH: JUSTIN ODELL @0139  ON 01/19/21 SKL (NOTE) The GeneXpert MRSA Assay (FDA approved for NASAL specimens only), is one component of a comprehensive MRSA colonization surveillance program. It is not intended to diagnose MRSA infection nor to guide or monitor treatment for MRSA infections. Test performance is not FDA approved in patients less than 66 years old. Performed at [facility], [address] [place], [place] [address]   Culture, blood (routine x 2)     Status: None   Collection Time: 01/20/2021 11:51 PM   Specimen: BLOOD LEFT FOREARM  Result Value Ref Range Status   Specimen Description BLOOD LEFT FOREARM  Final   Special Requests   Final    BOTTLES DRAWN AEROBIC AND ANAEROBIC Blood Culture adequate volume   Culture   Final    NO GROWTH 5 DAYS Performed at [facility], [address], [place], [place] [address]    Report Status 01/24/2021 FINAL  Final  Culture, Respiratory w Gram Stain     Status: None   Collection Time: 01/23/21  7:59 AM   Specimen: Tracheal Aspirate; Respiratory  Result Value Ref Range Status   Specimen Description   Final  TRACHEAL ASPIRATE Performed at [facility], [address], [place], [place] [address]    Special Requests   Final    NONE Performed at [facility], [place], [place] [address]    Gram Stain   Final    ABUNDANT WBC PRESENT,BOTH PMN AND  MONONUCLEAR RARE YEAST Performed at [facility], [place] [address], [place],  [address]    Culture FEW CANDIDA GUILLIERMONDII  Final   Report Status 01/26/2021 FINAL  Final    Lab Basic Metabolic Panel: Recent Labs  Lab 01/21/21 0547 01/22/21 0500 01/23/21 0849 01/24/21 0750 01/25/21 0403  NA 142 147* 149* 149* 151*  K 4.5 4.2 4.2 3.6 4.1  CL 116* 113* 117* 113* 119*  CO2 18* 22 24 26 23   GLUCOSE 110* 119* 132* 190* 136*  BUN 49* 51* 63* 69* 72*  CREATININE 0.97 1.16* 1.48* 1.38* 1.23*  CALCIUM 8.3* 8.6* 8.1* 8.4* 8.3*  MG 1.9 2.1 2.2 2.4 2.5*  PHOS 2.8 4.8* 5.1* 3.7 2.6   Liver Function Tests: Recent Labs  Lab 01/21/21 0547 01/22/21 0500 01/23/21 0849  AST 47* 53* 26  ALT 22 31 25   ALKPHOS 65 81 66  BILITOT 1.0 1.4* 1.0  PROT 5.9* 6.9 6.2*  ALBUMIN 2.5* 2.7* 2.7*   No results for input(s): LIPASE, AMYLASE in the last 168 hours. No results for input(s): AMMONIA in the last 168 hours. CBC: Recent Labs  Lab 01/21/21 0547 01/22/21 0500 01/23/21 0849 01/24/21 0750 01/25/21 0403  WBC 10.5 13.3* 21.3* 10.2 13.6*  NEUTROABS 9.7* 12.2* 20.1*  --   --   HGB 14.5 15.9* 14.2 14.2 15.4*  HCT 42.7 48.3* 44.2 42.5 46.1*  MCV 94.3 95.6 98.9 94.9 94.1  PLT 140* 122* 142* 94* 96*   Cardiac Enzymes: No results for input(s): CKTOTAL, CKMB, CKMBINDEX, TROPONINI in the last 168 hours. Sepsis Labs: Recent Labs  Lab 01/22/21 0500 01/23/21 0849 01/24/21 0750 01/25/21 0403  WBC 13.3* 21.3* 10.2 13.6*     Lenell Lama 02/25/21, 7:15 AM

## 2021-02-27 DEATH — deceased

## 2021-03-22 ENCOUNTER — Ambulatory Visit: Payer: Medicare Other | Admitting: Cardiovascular Disease

## 2021-03-22 ENCOUNTER — Telehealth: Payer: Self-pay | Admitting: Internal Medicine

## 2021-03-22 NOTE — Telephone Encounter (Signed)
Patient spouse wants to know what to do with Home monitoring device.

## 2021-03-22 NOTE — Telephone Encounter (Signed)
Spoke with pt spouse,  Advised he can discard remote monitor.
